# Patient Record
Sex: Female | Born: 1970 | Race: Black or African American | Hispanic: No | Marital: Single
Health system: Southern US, Academic
[De-identification: ages and names within clinical notes are randomized; demographics above are authoritative.]

## PROBLEM LIST (undated history)

## (undated) ENCOUNTER — Encounter

## (undated) ENCOUNTER — Ambulatory Visit

## (undated) ENCOUNTER — Encounter: Attending: Nephrology | Primary: Nephrology

## (undated) ENCOUNTER — Telehealth

## (undated) ENCOUNTER — Encounter: Attending: Infectious Disease | Primary: Infectious Disease

## (undated) ENCOUNTER — Encounter: Payer: MEDICAID | Attending: Podiatrist | Primary: Podiatrist

## (undated) ENCOUNTER — Encounter
Attending: Rehabilitative and Restorative Service Providers" | Primary: Rehabilitative and Restorative Service Providers"

## (undated) ENCOUNTER — Telehealth: Attending: Family Medicine | Primary: Family Medicine

## (undated) ENCOUNTER — Encounter
Payer: MEDICAID | Attending: Rehabilitative and Restorative Service Providers" | Primary: Rehabilitative and Restorative Service Providers"

## (undated) ENCOUNTER — Ambulatory Visit: Payer: MEDICAID | Attending: Family Medicine | Primary: Family Medicine

## (undated) ENCOUNTER — Telehealth: Attending: Infectious Disease | Primary: Infectious Disease

## (undated) ENCOUNTER — Ambulatory Visit: Payer: MEDICAID | Attending: Registered" | Primary: Registered"

## (undated) ENCOUNTER — Encounter: Attending: Podiatrist | Primary: Podiatrist

## (undated) ENCOUNTER — Encounter: Attending: Internal Medicine | Primary: Internal Medicine

## (undated) ENCOUNTER — Ambulatory Visit: Payer: Medicare (Managed Care) | Attending: Adult Health | Primary: Adult Health

## (undated) ENCOUNTER — Encounter: Payer: MEDICAID | Attending: Nephrology | Primary: Nephrology

## (undated) ENCOUNTER — Ambulatory Visit: Payer: Medicare (Managed Care) | Attending: Podiatrist | Primary: Podiatrist

## (undated) ENCOUNTER — Ambulatory Visit: Payer: MEDICAID

## (undated) ENCOUNTER — Ambulatory Visit: Payer: MEDICARE

## (undated) ENCOUNTER — Encounter: Payer: MEDICAID | Attending: Registered" | Primary: Registered"

## (undated) ENCOUNTER — Encounter: Payer: MEDICAID | Attending: Infectious Disease | Primary: Infectious Disease

## (undated) ENCOUNTER — Encounter: Payer: MEDICAID | Attending: Cardiovascular Disease | Primary: Cardiovascular Disease

## (undated) ENCOUNTER — Ambulatory Visit: Payer: Medicare (Managed Care)

## (undated) ENCOUNTER — Ambulatory Visit: Payer: MEDICARE | Attending: Vascular Surgery | Primary: Vascular Surgery

## (undated) ENCOUNTER — Encounter: Attending: Family Medicine | Primary: Family Medicine

## (undated) ENCOUNTER — Encounter: Attending: Acute Care | Primary: Acute Care

## (undated) ENCOUNTER — Encounter: Attending: Obstetrics & Gynecology | Primary: Obstetrics & Gynecology

## (undated) ENCOUNTER — Ambulatory Visit
Payer: Medicare (Managed Care) | Attending: Student in an Organized Health Care Education/Training Program | Primary: Student in an Organized Health Care Education/Training Program

## (undated) ENCOUNTER — Ambulatory Visit: Payer: MEDICARE | Attending: Podiatrist | Primary: Podiatrist

## (undated) ENCOUNTER — Ambulatory Visit: Payer: MEDICAID | Attending: Podiatrist | Primary: Podiatrist

## (undated) ENCOUNTER — Encounter
Attending: Student in an Organized Health Care Education/Training Program | Primary: Student in an Organized Health Care Education/Training Program

## (undated) ENCOUNTER — Telehealth: Attending: Nephrology | Primary: Nephrology

## (undated) ENCOUNTER — Non-Acute Institutional Stay: Payer: MEDICAID | Attending: Infectious Disease | Primary: Infectious Disease

## (undated) ENCOUNTER — Ambulatory Visit
Attending: Student in an Organized Health Care Education/Training Program | Primary: Student in an Organized Health Care Education/Training Program

## (undated) ENCOUNTER — Telehealth: Attending: Obstetrics & Gynecology | Primary: Obstetrics & Gynecology

## (undated) ENCOUNTER — Telehealth
Attending: Student in an Organized Health Care Education/Training Program | Primary: Student in an Organized Health Care Education/Training Program

## (undated) ENCOUNTER — Encounter: Payer: MEDICAID | Attending: Family Medicine | Primary: Family Medicine

## (undated) ENCOUNTER — Encounter: Payer: MEDICAID | Attending: Vascular Surgery | Primary: Vascular Surgery

## (undated) ENCOUNTER — Inpatient Hospital Stay: Payer: Medicare (Managed Care)

## (undated) ENCOUNTER — Ambulatory Visit: Payer: MEDICARE | Attending: Obstetrics & Gynecology | Primary: Obstetrics & Gynecology

## (undated) ENCOUNTER — Ambulatory Visit: Payer: MEDICARE | Attending: Family Medicine | Primary: Family Medicine

## (undated) ENCOUNTER — Encounter: Attending: Vascular Surgery | Primary: Vascular Surgery

## (undated) ENCOUNTER — Ambulatory Visit: Payer: Medicare (Managed Care) | Attending: Family Medicine | Primary: Family Medicine

## (undated) ENCOUNTER — Ambulatory Visit: Payer: Medicare (Managed Care) | Attending: Vascular Surgery | Primary: Vascular Surgery

## (undated) ENCOUNTER — Ambulatory Visit
Payer: MEDICAID | Attending: Student in an Organized Health Care Education/Training Program | Primary: Student in an Organized Health Care Education/Training Program

## (undated) ENCOUNTER — Ambulatory Visit: Attending: Podiatrist | Primary: Podiatrist

## (undated) ENCOUNTER — Telehealth: Attending: Acute Care | Primary: Acute Care

## (undated) ENCOUNTER — Encounter: Attending: Oncology | Primary: Oncology

## (undated) ENCOUNTER — Ambulatory Visit: Payer: Medicare (Managed Care) | Attending: Obstetrics & Gynecology | Primary: Obstetrics & Gynecology

## (undated) ENCOUNTER — Encounter: Attending: Cardiovascular Disease | Primary: Cardiovascular Disease

## (undated) ENCOUNTER — Other Ambulatory Visit

## (undated) ENCOUNTER — Ambulatory Visit: Payer: Medicare (Managed Care) | Attending: Orthopaedic Surgery | Primary: Orthopaedic Surgery

## (undated) ENCOUNTER — Ambulatory Visit: Payer: MEDICAID | Attending: Nephrology | Primary: Nephrology

## (undated) ENCOUNTER — Ambulatory Visit: Payer: Medicare (Managed Care) | Attending: Neurology | Primary: Neurology

## (undated) ENCOUNTER — Ambulatory Visit: Attending: Infectious Disease | Primary: Infectious Disease

## (undated) ENCOUNTER — Ambulatory Visit: Payer: Medicare (Managed Care) | Attending: Internal Medicine | Primary: Internal Medicine

## (undated) ENCOUNTER — Ambulatory Visit: Attending: Family Medicine | Primary: Family Medicine

## (undated) ENCOUNTER — Encounter: Payer: MEDICAID | Attending: Family | Primary: Family

## (undated) DIAGNOSIS — N183 Chronic kidney disease, stage 3 unspecified: Secondary | ICD-10-CM

## (undated) DIAGNOSIS — I219 Acute myocardial infarction, unspecified: Secondary | ICD-10-CM

## (undated) DIAGNOSIS — K219 Gastro-esophageal reflux disease without esophagitis: Secondary | ICD-10-CM

## (undated) DIAGNOSIS — E119 Type 2 diabetes mellitus without complications: Secondary | ICD-10-CM

## (undated) DIAGNOSIS — Z940 Kidney transplant status: Secondary | ICD-10-CM

## (undated) DIAGNOSIS — I10 Essential (primary) hypertension: Secondary | ICD-10-CM

## (undated) HISTORY — PX: TOE AMPUTATION: SHX809

## (undated) HISTORY — PX: NEPHRECTOMY TRANSPLANTED ORGAN: SUR880

## (undated) HISTORY — PX: COMBINED KIDNEY-PANCREAS TRANSPLANT: SHX1382

## (undated) HISTORY — PX: EYE SURGERY: SHX253

## (undated) HISTORY — PX: CARPAL TUNNEL RELEASE: SHX101

## (undated) MED ORDER — DALVANCE IV: INTRAVENOUS | 0 days

---
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
## 2004-02-02 ENCOUNTER — Ambulatory Visit: Payer: Self-pay

## 2004-04-29 ENCOUNTER — Emergency Department: Payer: Self-pay | Admitting: Emergency Medicine

## 2005-05-25 ENCOUNTER — Emergency Department: Payer: Self-pay | Admitting: Emergency Medicine

## 2005-05-25 ENCOUNTER — Other Ambulatory Visit: Payer: Self-pay

## 2005-10-08 ENCOUNTER — Encounter: Payer: Self-pay | Admitting: Family Medicine

## 2005-10-27 ENCOUNTER — Encounter: Payer: Self-pay | Admitting: Family Medicine

## 2006-04-12 ENCOUNTER — Ambulatory Visit: Payer: Self-pay | Admitting: Ophthalmology

## 2006-04-17 ENCOUNTER — Ambulatory Visit: Payer: Self-pay | Admitting: Ophthalmology

## 2006-07-03 ENCOUNTER — Emergency Department: Payer: Self-pay

## 2006-12-15 ENCOUNTER — Emergency Department: Payer: Self-pay | Admitting: Emergency Medicine

## 2007-03-25 ENCOUNTER — Other Ambulatory Visit: Payer: Self-pay

## 2007-03-25 ENCOUNTER — Ambulatory Visit: Payer: Self-pay | Admitting: Vascular Surgery

## 2007-03-26 ENCOUNTER — Ambulatory Visit: Payer: Self-pay | Admitting: Vascular Surgery

## 2007-05-13 ENCOUNTER — Ambulatory Visit: Payer: Self-pay | Admitting: Vascular Surgery

## 2007-07-17 ENCOUNTER — Ambulatory Visit: Payer: Self-pay | Admitting: Nephrology

## 2007-08-22 ENCOUNTER — Ambulatory Visit: Payer: Self-pay | Admitting: Vascular Surgery

## 2007-08-30 ENCOUNTER — Ambulatory Visit: Payer: Self-pay | Admitting: Nephrology

## 2007-09-23 ENCOUNTER — Ambulatory Visit: Payer: Self-pay | Admitting: Cardiology

## 2007-09-23 ENCOUNTER — Other Ambulatory Visit: Payer: Self-pay

## 2007-09-23 ENCOUNTER — Ambulatory Visit: Payer: Self-pay | Admitting: Vascular Surgery

## 2007-09-29 ENCOUNTER — Ambulatory Visit: Payer: Self-pay | Admitting: Vascular Surgery

## 2007-10-18 ENCOUNTER — Ambulatory Visit: Payer: Self-pay | Admitting: Internal Medicine

## 2007-10-31 ENCOUNTER — Ambulatory Visit: Payer: Self-pay | Admitting: Vascular Surgery

## 2007-11-14 ENCOUNTER — Ambulatory Visit: Payer: Self-pay | Admitting: Vascular Surgery

## 2007-11-27 ENCOUNTER — Ambulatory Visit: Payer: Self-pay | Admitting: Internal Medicine

## 2007-11-28 ENCOUNTER — Ambulatory Visit: Payer: Self-pay | Admitting: Vascular Surgery

## 2007-12-09 ENCOUNTER — Inpatient Hospital Stay: Payer: Self-pay | Admitting: Internal Medicine

## 2007-12-28 ENCOUNTER — Ambulatory Visit: Payer: Self-pay | Admitting: Oncology

## 2008-01-06 ENCOUNTER — Ambulatory Visit: Payer: Self-pay | Admitting: Nephrology

## 2008-01-12 ENCOUNTER — Ambulatory Visit: Payer: Self-pay | Admitting: Vascular Surgery

## 2008-01-13 ENCOUNTER — Emergency Department: Payer: Self-pay | Admitting: Emergency Medicine

## 2008-01-17 ENCOUNTER — Inpatient Hospital Stay: Payer: Self-pay | Admitting: Internal Medicine

## 2008-01-27 ENCOUNTER — Ambulatory Visit: Payer: Self-pay | Admitting: Oncology

## 2008-02-04 ENCOUNTER — Inpatient Hospital Stay: Payer: Self-pay | Admitting: Internal Medicine

## 2008-02-07 ENCOUNTER — Ambulatory Visit: Payer: Self-pay | Admitting: Nephrology

## 2008-02-12 ENCOUNTER — Ambulatory Visit: Payer: Self-pay | Admitting: Nephrology

## 2008-02-17 ENCOUNTER — Ambulatory Visit: Payer: Self-pay | Admitting: Internal Medicine

## 2008-03-04 ENCOUNTER — Ambulatory Visit: Payer: Self-pay | Admitting: Nephrology

## 2008-03-06 ENCOUNTER — Ambulatory Visit: Payer: Self-pay | Admitting: Nephrology

## 2008-03-08 ENCOUNTER — Ambulatory Visit: Payer: Self-pay | Admitting: Vascular Surgery

## 2008-03-09 ENCOUNTER — Ambulatory Visit: Payer: Self-pay | Admitting: Nephrology

## 2008-03-16 ENCOUNTER — Ambulatory Visit: Payer: Self-pay | Admitting: Nephrology

## 2008-03-26 ENCOUNTER — Ambulatory Visit: Payer: Self-pay | Admitting: Vascular Surgery

## 2008-03-29 ENCOUNTER — Ambulatory Visit: Payer: Self-pay | Admitting: Vascular Surgery

## 2008-04-08 ENCOUNTER — Emergency Department: Payer: Self-pay | Admitting: Emergency Medicine

## 2008-05-06 ENCOUNTER — Encounter: Payer: Self-pay | Admitting: Family

## 2008-05-21 ENCOUNTER — Ambulatory Visit: Payer: Self-pay | Admitting: Vascular Surgery

## 2008-06-16 ENCOUNTER — Inpatient Hospital Stay: Payer: Self-pay | Admitting: Podiatry

## 2008-06-27 ENCOUNTER — Inpatient Hospital Stay: Payer: Self-pay | Admitting: Nephrology

## 2008-07-09 ENCOUNTER — Ambulatory Visit: Payer: Self-pay | Admitting: Vascular Surgery

## 2008-07-27 ENCOUNTER — Emergency Department: Payer: Self-pay | Admitting: Internal Medicine

## 2008-08-04 ENCOUNTER — Ambulatory Visit: Payer: Self-pay | Admitting: Podiatry

## 2008-08-06 ENCOUNTER — Ambulatory Visit: Payer: Self-pay | Admitting: Podiatry

## 2008-09-07 ENCOUNTER — Ambulatory Visit: Payer: Self-pay | Admitting: Vascular Surgery

## 2008-10-21 ENCOUNTER — Emergency Department: Payer: Self-pay | Admitting: Emergency Medicine

## 2008-12-16 ENCOUNTER — Ambulatory Visit: Payer: Self-pay | Admitting: Vascular Surgery

## 2009-01-31 DIAGNOSIS — K3184 Gastroparesis: Secondary | ICD-10-CM | POA: Insufficient documentation

## 2009-04-01 ENCOUNTER — Ambulatory Visit: Payer: Self-pay | Admitting: Vascular Surgery

## 2009-04-05 ENCOUNTER — Ambulatory Visit: Payer: Self-pay | Admitting: Vascular Surgery

## 2009-10-11 ENCOUNTER — Other Ambulatory Visit: Payer: Self-pay

## 2009-10-27 ENCOUNTER — Other Ambulatory Visit: Payer: Self-pay

## 2009-11-26 ENCOUNTER — Other Ambulatory Visit: Payer: Self-pay

## 2010-01-16 ENCOUNTER — Other Ambulatory Visit: Payer: Self-pay

## 2010-01-26 ENCOUNTER — Other Ambulatory Visit: Payer: Self-pay

## 2010-02-20 ENCOUNTER — Other Ambulatory Visit: Payer: Self-pay

## 2010-02-26 ENCOUNTER — Other Ambulatory Visit: Payer: Self-pay

## 2010-04-25 ENCOUNTER — Other Ambulatory Visit: Payer: Self-pay

## 2010-04-27 ENCOUNTER — Other Ambulatory Visit: Payer: Self-pay

## 2010-05-15 ENCOUNTER — Encounter: Payer: Self-pay | Admitting: Family Medicine

## 2010-05-28 ENCOUNTER — Encounter: Payer: Self-pay | Admitting: Family Medicine

## 2010-05-29 ENCOUNTER — Other Ambulatory Visit: Payer: Self-pay

## 2010-06-27 ENCOUNTER — Encounter: Payer: Self-pay | Admitting: Family Medicine

## 2010-06-27 ENCOUNTER — Other Ambulatory Visit: Payer: Self-pay

## 2010-08-22 ENCOUNTER — Other Ambulatory Visit: Payer: Self-pay

## 2010-08-27 ENCOUNTER — Other Ambulatory Visit: Payer: Self-pay

## 2010-11-21 ENCOUNTER — Other Ambulatory Visit: Payer: Self-pay

## 2010-11-27 ENCOUNTER — Other Ambulatory Visit: Payer: Self-pay

## 2010-12-28 ENCOUNTER — Other Ambulatory Visit: Payer: Self-pay

## 2011-01-27 ENCOUNTER — Other Ambulatory Visit: Payer: Self-pay

## 2011-02-27 ENCOUNTER — Other Ambulatory Visit: Payer: Self-pay

## 2011-03-08 LAB — CBC WITH DIFFERENTIAL/PLATELET
Basophil #: 0 10*3/uL (ref 0.0–0.1)
Basophil %: 0.2 %
Eosinophil %: 3.2 %
HGB: 10.7 g/dL — ABNORMAL LOW (ref 12.0–16.0)
Lymphocyte %: 20.4 %
MCH: 30.4 pg (ref 26.0–34.0)
Monocyte #: 0.7 10*3/uL (ref 0.0–0.7)
Monocyte %: 8.8 %
Neutrophil %: 67.4 %
Platelet: 138 10*3/uL — ABNORMAL LOW (ref 150–440)
RBC: 3.52 10*6/uL — ABNORMAL LOW (ref 3.80–5.20)
WBC: 7.4 10*3/uL (ref 3.6–11.0)

## 2011-03-08 LAB — BASIC METABOLIC PANEL
BUN: 24 mg/dL — ABNORMAL HIGH (ref 7–18)
Chloride: 109 mmol/L — ABNORMAL HIGH (ref 98–107)
EGFR (Non-African Amer.): 58 — ABNORMAL LOW
Glucose: 69 mg/dL (ref 65–99)
Potassium: 4.2 mmol/L (ref 3.5–5.1)
Sodium: 141 mmol/L (ref 136–145)

## 2011-03-08 LAB — AMYLASE: Amylase: 32 U/L (ref 25–115)

## 2011-03-08 LAB — LIPASE, BLOOD: Lipase: 76 U/L (ref 73–393)

## 2011-03-08 LAB — PHOSPHORUS: Phosphorus: 3.6 mg/dL (ref 2.5–4.9)

## 2011-03-30 ENCOUNTER — Other Ambulatory Visit: Payer: Self-pay

## 2011-05-17 ENCOUNTER — Other Ambulatory Visit: Payer: Self-pay

## 2011-05-17 LAB — CBC WITH DIFFERENTIAL/PLATELET
Basophil #: 0 10*3/uL (ref 0.0–0.1)
Eosinophil %: 3 %
HCT: 35.8 % (ref 35.0–47.0)
Lymphocyte #: 0.9 10*3/uL — ABNORMAL LOW (ref 1.0–3.6)
Lymphocyte %: 14.1 %
MCH: 30.4 pg (ref 26.0–34.0)
MCV: 94 fL (ref 80–100)
Monocyte #: 0.6 10*3/uL (ref 0.0–0.7)
Monocyte %: 9.2 %
Neutrophil #: 4.9 10*3/uL (ref 1.4–6.5)
RDW: 15.5 % — ABNORMAL HIGH (ref 11.5–14.5)
WBC: 6.7 10*3/uL (ref 3.6–11.0)

## 2011-05-17 LAB — BASIC METABOLIC PANEL
Anion Gap: 9 (ref 7–16)
BUN: 20 mg/dL — ABNORMAL HIGH (ref 7–18)
Calcium, Total: 8.5 mg/dL (ref 8.5–10.1)
Chloride: 113 mmol/L — ABNORMAL HIGH (ref 98–107)
Co2: 19 mmol/L — ABNORMAL LOW (ref 21–32)
Creatinine: 0.89 mg/dL (ref 0.60–1.30)
EGFR (Non-African Amer.): >60
Glucose: 72 mg/dL (ref 65–99)
Osmolality: 282 (ref 275–301)
Potassium: 5 mmol/L (ref 3.5–5.1)
Sodium: 141 mmol/L (ref 136–145)

## 2011-05-17 LAB — PHOSPHORUS: Phosphorus: 3.3 mg/dL (ref 2.5–4.9)

## 2011-05-28 ENCOUNTER — Other Ambulatory Visit: Payer: Self-pay

## 2011-09-04 ENCOUNTER — Other Ambulatory Visit: Payer: Self-pay

## 2011-09-04 LAB — CBC WITH DIFFERENTIAL/PLATELET
Basophil %: 0.6 %
Eosinophil #: 0.5 10*3/uL (ref 0.0–0.7)
HCT: 35.4 % (ref 35.0–47.0)
HGB: 11.3 g/dL — ABNORMAL LOW (ref 12.0–16.0)
Lymphocyte #: 1.1 10*3/uL (ref 1.0–3.6)
Lymphocyte %: 18.8 %
MCH: 30.1 pg (ref 26.0–34.0)
MCHC: 31.8 g/dL — ABNORMAL LOW (ref 32.0–36.0)
Monocyte #: 0.5 x10 3/mm (ref 0.2–0.9)
Neutrophil %: 63.1 %
Platelet: 158 10*3/uL (ref 150–440)

## 2011-09-04 LAB — BASIC METABOLIC PANEL
Anion Gap: 7 (ref 7–16)
BUN: 27 mg/dL — ABNORMAL HIGH (ref 7–18)
Calcium, Total: 8.5 mg/dL (ref 8.5–10.1)
EGFR (African American): >60
EGFR (Non-African Amer.): 52 — ABNORMAL LOW
Glucose: 77 mg/dL (ref 65–99)
Osmolality: 283 (ref 275–301)

## 2011-09-04 LAB — HEPATIC FUNCTION PANEL A (ARMC)
Bilirubin, Direct: 0.2 mg/dL (ref 0.00–0.20)
SGOT(AST): 25 U/L (ref 15–37)

## 2011-09-04 LAB — MAGNESIUM: Magnesium: 1.7 mg/dL — ABNORMAL LOW

## 2011-09-04 LAB — PHOSPHORUS: Phosphorus: 3.2 mg/dL (ref 2.5–4.9)

## 2011-09-27 ENCOUNTER — Other Ambulatory Visit: Payer: Self-pay

## 2011-10-16 LAB — CBC WITH DIFFERENTIAL/PLATELET
Basophil %: 0.6 %
Eosinophil #: 0.3 10*3/uL (ref 0.0–0.7)
Eosinophil %: 4.2 %
HCT: 35.4 % (ref 35.0–47.0)
HGB: 11.6 g/dL — ABNORMAL LOW (ref 12.0–16.0)
Lymphocyte %: 14.2 %
MCH: 30.9 pg (ref 26.0–34.0)
MCHC: 32.8 g/dL (ref 32.0–36.0)
Neutrophil #: 4.7 10*3/uL (ref 1.4–6.5)
Neutrophil %: 72.4 %

## 2011-10-16 LAB — BASIC METABOLIC PANEL
Calcium, Total: 8.7 mg/dL (ref 8.5–10.1)
Chloride: 111 mmol/L — ABNORMAL HIGH (ref 98–107)
Co2: 22 mmol/L (ref 21–32)
Creatinine: 1.08 mg/dL (ref 0.60–1.30)
EGFR (African American): >60
EGFR (Non-African Amer.): >60
Glucose: 71 mg/dL (ref 65–99)
Potassium: 4.4 mmol/L (ref 3.5–5.1)
Sodium: 139 mmol/L (ref 136–145)

## 2011-10-16 LAB — PHOSPHORUS: Phosphorus: 2.5 mg/dL (ref 2.5–4.9)

## 2011-10-28 ENCOUNTER — Other Ambulatory Visit: Payer: Self-pay

## 2011-11-20 ENCOUNTER — Encounter: Payer: Self-pay | Admitting: Family Medicine

## 2011-11-27 ENCOUNTER — Encounter: Payer: Self-pay | Admitting: Family Medicine

## 2011-12-28 ENCOUNTER — Encounter: Payer: Self-pay | Admitting: Family Medicine

## 2012-02-08 ENCOUNTER — Other Ambulatory Visit: Payer: Self-pay | Admitting: Nephrology

## 2012-02-08 LAB — LIPID PANEL
Cholesterol: 123 mg/dL (ref 0–200)
HDL Cholesterol: 71 mg/dL — ABNORMAL HIGH (ref 40–60)
Ldl Cholesterol, Calc: 43 mg/dL (ref 0–100)
Triglycerides: 45 mg/dL (ref 0–200)
VLDL Cholesterol, Calc: 9 mg/dL (ref 5–40)

## 2012-02-08 LAB — COMPREHENSIVE METABOLIC PANEL
Alkaline Phosphatase: 97 U/L (ref 50–136)
BUN: 21 mg/dL — ABNORMAL HIGH (ref 7–18)
Bilirubin,Total: 0.4 mg/dL (ref 0.2–1.0)
Chloride: 110 mmol/L — ABNORMAL HIGH (ref 98–107)
Co2: 22 mmol/L (ref 21–32)
Creatinine: 1.11 mg/dL (ref 0.60–1.30)
Osmolality: 280 (ref 275–301)
SGOT(AST): 24 U/L (ref 15–37)
SGPT (ALT): 28 U/L (ref 12–78)
Sodium: 139 mmol/L (ref 136–145)
Total Protein: 7.6 g/dL (ref 6.4–8.2)

## 2012-02-08 LAB — CBC WITH DIFFERENTIAL/PLATELET
Basophil #: 0.1 10*3/uL (ref 0.0–0.1)
Eosinophil %: 4.2 %
Lymphocyte %: 18.7 %
MCV: 94 fL (ref 80–100)
Monocyte #: 0.5 x10 3/mm (ref 0.2–0.9)
Monocyte %: 9.4 %
Neutrophil %: 66.8 %
Platelet: 220 10*3/uL (ref 150–440)
RDW: 14.6 % — ABNORMAL HIGH (ref 11.5–14.5)
WBC: 5.8 10*3/uL (ref 3.6–11.0)

## 2012-02-08 LAB — MAGNESIUM: Magnesium: 2 mg/dL

## 2012-02-08 LAB — PHOSPHORUS: Phosphorus: 3.1 mg/dL (ref 2.5–4.9)

## 2012-02-27 ENCOUNTER — Other Ambulatory Visit: Payer: Self-pay | Admitting: Nephrology

## 2012-03-25 LAB — CBC WITH DIFFERENTIAL/PLATELET
Basophil %: 0.6 %
Eosinophil #: 0.4 10*3/uL (ref 0.0–0.7)
Eosinophil %: 5.6 %
HGB: 12 g/dL (ref 12.0–16.0)
Lymphocyte #: 1.1 10*3/uL (ref 1.0–3.6)
Lymphocyte %: 15.3 %
MCH: 30.4 pg (ref 26.0–34.0)
MCHC: 32.2 g/dL (ref 32.0–36.0)
MCV: 94 fL (ref 80–100)
Monocyte #: 0.6 x10 3/mm (ref 0.2–0.9)
Neutrophil #: 5.3 10*3/uL (ref 1.4–6.5)
Platelet: 164 10*3/uL (ref 150–440)
RBC: 3.95 10*6/uL (ref 3.80–5.20)
RDW: 15 % — ABNORMAL HIGH (ref 11.5–14.5)
WBC: 7.5 10*3/uL (ref 3.6–11.0)

## 2012-03-25 LAB — BASIC METABOLIC PANEL
BUN: 22 mg/dL — ABNORMAL HIGH (ref 7–18)
Calcium, Total: 8.8 mg/dL (ref 8.5–10.1)
Chloride: 113 mmol/L — ABNORMAL HIGH (ref 98–107)
Co2: 19 mmol/L — ABNORMAL LOW (ref 21–32)
Creatinine: 1.19 mg/dL (ref 0.60–1.30)
EGFR (African American): >60
Glucose: 80 mg/dL (ref 65–99)
Potassium: 4.8 mmol/L (ref 3.5–5.1)
Sodium: 140 mmol/L (ref 136–145)

## 2012-03-25 LAB — PHOSPHORUS: Phosphorus: 2.9 mg/dL (ref 2.5–4.9)

## 2012-03-25 LAB — MAGNESIUM: Magnesium: 1.9 mg/dL

## 2012-03-29 ENCOUNTER — Other Ambulatory Visit: Payer: Self-pay | Admitting: Nephrology

## 2012-06-05 ENCOUNTER — Other Ambulatory Visit: Payer: Self-pay | Admitting: Nephrology

## 2012-06-05 LAB — CBC WITH DIFFERENTIAL/PLATELET
Basophil #: 0 10*3/uL (ref 0.0–0.1)
Basophil %: 0.6 %
Eosinophil %: 5.2 %
HCT: 34.9 % — ABNORMAL LOW (ref 35.0–47.0)
Lymphocyte #: 1.1 10*3/uL (ref 1.0–3.6)
MCHC: 32.4 g/dL (ref 32.0–36.0)
MCV: 94 fL (ref 80–100)
Monocyte #: 0.5 x10 3/mm (ref 0.2–0.9)
Monocyte %: 7.2 %
Neutrophil #: 4.6 10*3/uL (ref 1.4–6.5)
Neutrophil %: 70.6 %
RBC: 3.71 10*6/uL — ABNORMAL LOW (ref 3.80–5.20)
RDW: 15.6 % — ABNORMAL HIGH (ref 11.5–14.5)
WBC: 6.5 10*3/uL (ref 3.6–11.0)

## 2012-06-05 LAB — COMPREHENSIVE METABOLIC PANEL
Albumin: 3.6 g/dL (ref 3.4–5.0)
Alkaline Phosphatase: 79 U/L (ref 50–136)
Anion Gap: 4 — ABNORMAL LOW (ref 7–16)
Chloride: 114 mmol/L — ABNORMAL HIGH (ref 98–107)
EGFR (Non-African Amer.): 60 — ABNORMAL LOW
Glucose: 84 mg/dL (ref 65–99)
Osmolality: 282 (ref 275–301)
SGOT(AST): 24 U/L (ref 15–37)
SGPT (ALT): 25 U/L (ref 12–78)
Total Protein: 7.2 g/dL (ref 6.4–8.2)

## 2012-06-05 LAB — HEMOGLOBIN A1C: Hemoglobin A1C: 4.2 % (ref 4.2–6.3)

## 2012-06-05 LAB — AMYLASE: Amylase: 37 U/L (ref 25–115)

## 2012-06-05 LAB — BILIRUBIN, DIRECT: Bilirubin, Direct: 0.1 mg/dL (ref 0.00–0.20)

## 2012-06-26 ENCOUNTER — Other Ambulatory Visit: Payer: Self-pay | Admitting: Nephrology

## 2012-08-26 ENCOUNTER — Other Ambulatory Visit: Payer: Self-pay | Admitting: Nephrology

## 2012-08-26 LAB — BASIC METABOLIC PANEL
Anion Gap: 8 (ref 7–16)
BUN: 28 mg/dL — ABNORMAL HIGH (ref 7–18)
Calcium, Total: 8.9 mg/dL (ref 8.5–10.1)
Chloride: 109 mmol/L — ABNORMAL HIGH (ref 98–107)
Co2: 23 mmol/L (ref 21–32)
Creatinine: 1.51 mg/dL — ABNORMAL HIGH (ref 0.60–1.30)
EGFR (African American): 49 — ABNORMAL LOW
EGFR (Non-African Amer.): 42 — ABNORMAL LOW
Glucose: 73 mg/dL (ref 65–99)
Osmolality: 283 (ref 275–301)
Potassium: 4.5 mmol/L (ref 3.5–5.1)
Sodium: 140 mmol/L (ref 136–145)

## 2012-08-26 LAB — CBC WITH DIFFERENTIAL/PLATELET
Basophil #: 0 10*3/uL (ref 0.0–0.1)
Basophil %: 0.6 %
Eosinophil #: 0.3 10*3/uL (ref 0.0–0.7)
Eosinophil %: 4.5 %
HCT: 34.9 % — ABNORMAL LOW (ref 35.0–47.0)
HGB: 11.4 g/dL — ABNORMAL LOW (ref 12.0–16.0)
Lymphocyte #: 1.4 10*3/uL (ref 1.0–3.6)
Lymphocyte %: 18.3 %
MCH: 30 pg (ref 26.0–34.0)
MCHC: 32.6 g/dL (ref 32.0–36.0)
MCV: 92 fL (ref 80–100)
Monocyte #: 0.7 x10 3/mm (ref 0.2–0.9)
Monocyte %: 9.2 %
Neutrophil #: 5.1 10*3/uL (ref 1.4–6.5)
Neutrophil %: 67.4 %
Platelet: 196 10*3/uL (ref 150–440)
RBC: 3.79 10*6/uL — ABNORMAL LOW (ref 3.80–5.20)
RDW: 14.1 % (ref 11.5–14.5)
WBC: 7.5 10*3/uL (ref 3.6–11.0)

## 2012-08-26 LAB — HEPATIC FUNCTION PANEL A (ARMC)
Albumin: 3.6 g/dL (ref 3.4–5.0)
Alkaline Phosphatase: 90 U/L (ref 50–136)
Bilirubin, Direct: 0.1 mg/dL (ref 0.00–0.20)
Bilirubin,Total: 0.4 mg/dL (ref 0.2–1.0)
SGOT(AST): 23 U/L (ref 15–37)
SGPT (ALT): 26 U/L (ref 12–78)
Total Protein: 7.5 g/dL (ref 6.4–8.2)

## 2012-08-26 LAB — MAGNESIUM: Magnesium: 2.2 mg/dL

## 2012-08-26 LAB — HEMOGLOBIN A1C: Hemoglobin A1C: 4.9 % (ref 4.2–6.3)

## 2012-08-27 LAB — GAMMA GT: GGT: 13 U/L (ref 5–85)

## 2012-09-23 LAB — CBC WITH DIFFERENTIAL/PLATELET
Basophil #: 0 10*3/uL (ref 0.0–0.1)
Eosinophil #: 0.3 10*3/uL (ref 0.0–0.7)
HCT: 30.7 % — ABNORMAL LOW (ref 35.0–47.0)
HGB: 10.3 g/dL — ABNORMAL LOW (ref 12.0–16.0)
Lymphocyte %: 20.1 %
MCH: 31.2 pg (ref 26.0–34.0)
MCHC: 33.7 g/dL (ref 32.0–36.0)
MCV: 93 fL (ref 80–100)
Monocyte #: 0.6 x10 3/mm (ref 0.2–0.9)
Monocyte %: 8.4 %
Neutrophil #: 5.1 10*3/uL (ref 1.4–6.5)
Platelet: 236 10*3/uL (ref 150–440)
RBC: 3.32 10*6/uL — ABNORMAL LOW (ref 3.80–5.20)
RDW: 15 % — ABNORMAL HIGH (ref 11.5–14.5)

## 2012-09-23 LAB — BASIC METABOLIC PANEL
Anion Gap: 9 (ref 7–16)
Chloride: 112 mmol/L — ABNORMAL HIGH (ref 98–107)
Creatinine: 1.24 mg/dL (ref 0.60–1.30)
EGFR (African American): >60
EGFR (Non-African Amer.): 54 — ABNORMAL LOW
Glucose: 89 mg/dL (ref 65–99)
Osmolality: 286 (ref 275–301)
Sodium: 142 mmol/L (ref 136–145)

## 2012-09-23 LAB — GAMMA GT: GGT: 8 U/L (ref 5–85)

## 2012-09-23 LAB — HEPATIC FUNCTION PANEL A (ARMC)
Albumin: 3.3 g/dL — ABNORMAL LOW (ref 3.4–5.0)
Alkaline Phosphatase: 83 U/L (ref 50–136)
SGOT(AST): 15 U/L (ref 15–37)
SGPT (ALT): 22 U/L (ref 12–78)

## 2012-09-23 LAB — AMYLASE: Amylase: 43 U/L (ref 25–115)

## 2012-09-23 LAB — LIPASE, BLOOD: Lipase: 140 U/L (ref 73–393)

## 2012-09-23 LAB — HEMOGLOBIN A1C: Hemoglobin A1C: 4.8 % (ref 4.2–6.3)

## 2012-09-26 ENCOUNTER — Other Ambulatory Visit: Payer: Self-pay | Admitting: Nephrology

## 2012-11-06 ENCOUNTER — Other Ambulatory Visit: Payer: Self-pay | Admitting: Nephrology

## 2012-11-06 LAB — CBC WITH DIFFERENTIAL/PLATELET
Basophil #: 0 10*3/uL (ref 0.0–0.1)
Basophil %: 0.3 %
Eosinophil #: 0.2 10*3/uL (ref 0.0–0.7)
Eosinophil %: 1.9 %
HCT: 30.3 % — ABNORMAL LOW (ref 35.0–47.0)
HGB: 9.9 g/dL — ABNORMAL LOW (ref 12.0–16.0)
Lymphocyte #: 1.4 10*3/uL (ref 1.0–3.6)
Monocyte #: 0.6 x10 3/mm (ref 0.2–0.9)
RBC: 3.24 10*6/uL — ABNORMAL LOW (ref 3.80–5.20)
WBC: 8.4 10*3/uL (ref 3.6–11.0)

## 2012-11-06 LAB — PHOSPHORUS: Phosphorus: 2.4 mg/dL — ABNORMAL LOW (ref 2.5–4.9)

## 2012-11-06 LAB — BASIC METABOLIC PANEL
Anion Gap: 7 (ref 7–16)
BUN: 23 mg/dL — ABNORMAL HIGH (ref 7–18)
Calcium, Total: 8.4 mg/dL — ABNORMAL LOW (ref 8.5–10.1)
Chloride: 110 mmol/L — ABNORMAL HIGH (ref 98–107)
Co2: 19 mmol/L — ABNORMAL LOW (ref 21–32)
Creatinine: 1.1 mg/dL (ref 0.60–1.30)
EGFR (African American): >60
Glucose: 76 mg/dL (ref 65–99)
Osmolality: 274 (ref 275–301)
Sodium: 136 mmol/L (ref 136–145)

## 2012-11-06 LAB — AMYLASE: Amylase: 36 U/L (ref 25–115)

## 2012-11-06 LAB — LIPASE, BLOOD: Lipase: 88 U/L (ref 73–393)

## 2012-11-06 LAB — MAGNESIUM: Magnesium: 2.1 mg/dL

## 2012-11-26 ENCOUNTER — Other Ambulatory Visit: Payer: Self-pay | Admitting: Nephrology

## 2013-01-05 ENCOUNTER — Other Ambulatory Visit: Payer: Self-pay | Admitting: Nephrology

## 2013-01-05 LAB — CBC WITH DIFFERENTIAL/PLATELET
Basophil #: 0.1 10*3/uL (ref 0.0–0.1)
Basophil %: 0.7 %
Eosinophil %: 2.7 %
HGB: 10 g/dL — ABNORMAL LOW (ref 12.0–16.0)
Lymphocyte #: 1.2 10*3/uL (ref 1.0–3.6)
MCV: 94 fL (ref 80–100)
Monocyte %: 10.4 %
Neutrophil #: 5.3 10*3/uL (ref 1.4–6.5)
Neutrophil %: 69.7 %
RBC: 3.24 10*6/uL — ABNORMAL LOW (ref 3.80–5.20)
RDW: 14.7 % — ABNORMAL HIGH (ref 11.5–14.5)
WBC: 7.5 10*3/uL (ref 3.6–11.0)

## 2013-01-05 LAB — LIPASE, BLOOD: Lipase: 103 U/L (ref 73–393)

## 2013-01-05 LAB — BASIC METABOLIC PANEL
BUN: 27 mg/dL — ABNORMAL HIGH (ref 7–18)
Co2: 21 mmol/L (ref 21–32)
EGFR (African American): 58 — ABNORMAL LOW
EGFR (Non-African Amer.): 50 — ABNORMAL LOW
Glucose: 89 mg/dL (ref 65–99)
Osmolality: 282 (ref 275–301)
Sodium: 139 mmol/L (ref 136–145)

## 2013-01-05 LAB — PHOSPHORUS: Phosphorus: 3.1 mg/dL (ref 2.5–4.9)

## 2013-01-05 LAB — HEPATIC FUNCTION PANEL A (ARMC)
Albumin: 3.5 g/dL (ref 3.4–5.0)
Alkaline Phosphatase: 74 U/L (ref 50–136)
Bilirubin, Direct: 0.1 mg/dL (ref 0.00–0.20)
Bilirubin,Total: 0.3 mg/dL (ref 0.2–1.0)
SGOT(AST): 21 U/L (ref 15–37)
SGPT (ALT): 23 U/L (ref 12–78)

## 2013-01-05 LAB — AMYLASE: Amylase: 36 U/L (ref 25–115)

## 2013-01-05 LAB — MAGNESIUM: Magnesium: 1.8 mg/dL

## 2013-01-26 ENCOUNTER — Other Ambulatory Visit: Payer: Self-pay | Admitting: Nephrology

## 2013-01-26 ENCOUNTER — Other Ambulatory Visit: Payer: Self-pay | Admitting: Podiatry

## 2013-02-21 ENCOUNTER — Emergency Department: Payer: Self-pay | Admitting: Emergency Medicine

## 2013-02-21 LAB — CBC WITH DIFFERENTIAL/PLATELET
Eosinophil #: 0.2 10*3/uL (ref 0.0–0.7)
Eosinophil %: 1.4 %
HCT: 32.3 % — ABNORMAL LOW (ref 35.0–47.0)
HGB: 10.4 g/dL — ABNORMAL LOW (ref 12.0–16.0)
Lymphocyte #: 1.3 10*3/uL (ref 1.0–3.6)
MCHC: 32.3 g/dL (ref 32.0–36.0)
MCV: 92 fL (ref 80–100)
Monocyte #: 1.6 x10 3/mm — ABNORMAL HIGH (ref 0.2–0.9)
Monocyte %: 13.2 %
Neutrophil #: 8.8 10*3/uL — ABNORMAL HIGH (ref 1.4–6.5)
Neutrophil %: 74.5 %
RBC: 3.53 10*6/uL — ABNORMAL LOW (ref 3.80–5.20)
RDW: 14.2 % (ref 11.5–14.5)
WBC: 11.9 10*3/uL — ABNORMAL HIGH (ref 3.6–11.0)

## 2013-02-21 LAB — COMPREHENSIVE METABOLIC PANEL
Anion Gap: 10 (ref 7–16)
BUN: 51 mg/dL — ABNORMAL HIGH (ref 7–18)
Calcium, Total: 9.3 mg/dL (ref 8.5–10.1)
Chloride: 108 mmol/L — ABNORMAL HIGH (ref 98–107)
Co2: 19 mmol/L — ABNORMAL LOW (ref 21–32)
Creatinine: 2.79 mg/dL — ABNORMAL HIGH (ref 0.60–1.30)
Glucose: 91 mg/dL (ref 65–99)
Potassium: 3.6 mmol/L (ref 3.5–5.1)
SGPT (ALT): 15 U/L (ref 12–78)

## 2013-02-26 LAB — CULTURE, BLOOD (SINGLE)

## 2013-05-05 ENCOUNTER — Other Ambulatory Visit: Payer: Self-pay | Admitting: Nephrology

## 2013-05-05 LAB — CBC WITH DIFFERENTIAL/PLATELET
Basophil #: 0.1 10*3/uL (ref 0.0–0.1)
Basophil %: 0.8 %
EOS ABS: 0.3 10*3/uL (ref 0.0–0.7)
Eosinophil %: 5.1 %
HCT: 31.9 % — ABNORMAL LOW (ref 35.0–47.0)
HGB: 10.4 g/dL — ABNORMAL LOW (ref 12.0–16.0)
Lymphocyte #: 1.4 10*3/uL (ref 1.0–3.6)
Lymphocyte %: 20.8 %
MCH: 30.7 pg (ref 26.0–34.0)
MCHC: 32.6 g/dL (ref 32.0–36.0)
MCV: 94 fL (ref 80–100)
Monocyte #: 0.4 x10 3/mm (ref 0.2–0.9)
Monocyte %: 6.4 %
Neutrophil #: 4.5 10*3/uL (ref 1.4–6.5)
Neutrophil %: 66.9 %
PLATELETS: 201 10*3/uL (ref 150–440)
RBC: 3.39 10*6/uL — AB (ref 3.80–5.20)
RDW: 16.9 % — ABNORMAL HIGH (ref 11.5–14.5)
WBC: 6.8 10*3/uL (ref 3.6–11.0)

## 2013-05-05 LAB — LIPASE, BLOOD: Lipase: 120 U/L (ref 73–393)

## 2013-05-05 LAB — PHOSPHORUS: Phosphorus: 3.3 mg/dL (ref 2.5–4.9)

## 2013-05-05 LAB — HEPATIC FUNCTION PANEL A (ARMC)
ALBUMIN: 3.3 g/dL — AB (ref 3.4–5.0)
AST: 19 U/L (ref 15–37)
Alkaline Phosphatase: 88 U/L
BILIRUBIN DIRECT: 0.1 mg/dL (ref 0.00–0.20)
Bilirubin,Total: 0.4 mg/dL (ref 0.2–1.0)
SGPT (ALT): 18 U/L (ref 12–78)
TOTAL PROTEIN: 7.4 g/dL (ref 6.4–8.2)

## 2013-05-05 LAB — HEMOGLOBIN A1C: HEMOGLOBIN A1C: 4.7 % (ref 4.2–6.3)

## 2013-05-05 LAB — BASIC METABOLIC PANEL
ANION GAP: 5 — AB (ref 7–16)
BUN: 26 mg/dL — AB (ref 7–18)
CO2: 21 mmol/L (ref 21–32)
Calcium, Total: 9.2 mg/dL (ref 8.5–10.1)
Chloride: 110 mmol/L — ABNORMAL HIGH (ref 98–107)
Creatinine: 1.12 mg/dL (ref 0.60–1.30)
EGFR (Non-African Amer.): >60
GLUCOSE: 63 mg/dL — AB (ref 65–99)
OSMOLALITY: 275 (ref 275–301)
Potassium: 4.8 mmol/L (ref 3.5–5.1)
SODIUM: 136 mmol/L (ref 136–145)

## 2013-05-05 LAB — GAMMA GT: GGT: 13 U/L (ref 5–85)

## 2013-05-05 LAB — MAGNESIUM: MAGNESIUM: 1.9 mg/dL

## 2013-05-05 LAB — AMYLASE: AMYLASE: 45 U/L (ref 25–115)

## 2013-05-12 LAB — SEDIMENTATION RATE: Erythrocyte Sed Rate: 35 mm/hr — ABNORMAL HIGH (ref 0–20)

## 2013-05-27 ENCOUNTER — Other Ambulatory Visit: Payer: Self-pay | Admitting: Nephrology

## 2013-07-13 ENCOUNTER — Other Ambulatory Visit: Payer: Self-pay | Admitting: Nephrology

## 2013-07-13 LAB — HEMOGLOBIN A1C: Hemoglobin A1C: 5 % (ref 4.2–6.3)

## 2013-07-27 ENCOUNTER — Other Ambulatory Visit: Payer: Self-pay | Admitting: Nephrology

## 2013-08-13 ENCOUNTER — Ambulatory Visit: Payer: Self-pay

## 2013-08-13 DIAGNOSIS — Z01810 Encounter for preprocedural cardiovascular examination: Secondary | ICD-10-CM

## 2013-08-17 ENCOUNTER — Encounter: Payer: Self-pay | Admitting: Surgery

## 2013-08-26 ENCOUNTER — Encounter: Payer: Self-pay | Admitting: Surgery

## 2014-02-15 ENCOUNTER — Encounter: Payer: Self-pay | Admitting: Nephrology

## 2014-02-15 LAB — CBC WITH DIFFERENTIAL/PLATELET
Basophil #: 0 10*3/uL (ref 0.0–0.1)
Basophil %: 0.7 %
Eosinophil #: 0.2 10*3/uL (ref 0.0–0.7)
Eosinophil %: 3.2 %
HCT: 32.1 % — AB (ref 35.0–47.0)
HGB: 10.2 g/dL — AB (ref 12.0–16.0)
LYMPHS PCT: 21 %
Lymphocyte #: 1.3 10*3/uL (ref 1.0–3.6)
MCH: 30.1 pg (ref 26.0–34.0)
MCHC: 31.7 g/dL — ABNORMAL LOW (ref 32.0–36.0)
MCV: 95 fL (ref 80–100)
MONO ABS: 0.5 x10 3/mm (ref 0.2–0.9)
Monocyte %: 7.4 %
NEUTROS PCT: 67.7 %
Neutrophil #: 4.1 10*3/uL (ref 1.4–6.5)
Platelet: 186 10*3/uL (ref 150–440)
RBC: 3.38 10*6/uL — ABNORMAL LOW (ref 3.80–5.20)
RDW: 14.9 % — ABNORMAL HIGH (ref 11.5–14.5)
WBC: 6.1 10*3/uL (ref 3.6–11.0)

## 2014-02-15 LAB — COMPREHENSIVE METABOLIC PANEL
ALBUMIN: 3.5 g/dL (ref 3.4–5.0)
ALT: 19 U/L
ANION GAP: 5 — AB (ref 7–16)
Alkaline Phosphatase: 77 U/L
BUN: 34 mg/dL — ABNORMAL HIGH (ref 7–18)
Bilirubin,Total: 0.4 mg/dL (ref 0.2–1.0)
CHLORIDE: 115 mmol/L — AB (ref 98–107)
CREATININE: 1.38 mg/dL — AB (ref 0.60–1.30)
Calcium, Total: 8.9 mg/dL (ref 8.5–10.1)
Co2: 20 mmol/L — ABNORMAL LOW (ref 21–32)
GFR CALC AF AMER: 54 — AB
GFR CALC NON AF AMER: 44 — AB
Glucose: 84 mg/dL (ref 65–99)
OSMOLALITY: 286 (ref 275–301)
POTASSIUM: 4.6 mmol/L (ref 3.5–5.1)
SGOT(AST): 19 U/L (ref 15–37)
Sodium: 140 mmol/L (ref 136–145)
Total Protein: 7.2 g/dL (ref 6.4–8.2)

## 2014-02-15 LAB — GAMMA GT: GGT: 6 U/L (ref 5–85)

## 2014-02-15 LAB — LIPASE, BLOOD: Lipase: 142 U/L (ref 73–393)

## 2014-02-15 LAB — MAGNESIUM: Magnesium: 2.1 mg/dL

## 2014-02-15 LAB — AMYLASE: Amylase: 49 U/L (ref 25–115)

## 2014-02-15 LAB — HEMOGLOBIN A1C: HEMOGLOBIN A1C: 4.7 % (ref 4.2–6.3)

## 2014-02-15 LAB — PHOSPHORUS: PHOSPHORUS: 3.3 mg/dL (ref 2.5–4.9)

## 2014-02-26 ENCOUNTER — Encounter: Payer: Self-pay | Admitting: Nephrology

## 2014-03-09 DIAGNOSIS — E11610 Type 2 diabetes mellitus with diabetic neuropathic arthropathy: Secondary | ICD-10-CM | POA: Insufficient documentation

## 2014-09-16 ENCOUNTER — Encounter
Admission: RE | Admit: 2014-09-16 | Discharge: 2014-09-16 | Disposition: A | Discharge status: Home / Self Care | Payer: Medicare Other | Source: Ambulatory Visit | Attending: Nephrology | Admitting: Nephrology

## 2014-09-16 DIAGNOSIS — Z940 Kidney transplant status: Secondary | ICD-10-CM | POA: Diagnosis not present

## 2014-09-16 DIAGNOSIS — E559 Vitamin D deficiency, unspecified: Secondary | ICD-10-CM | POA: Insufficient documentation

## 2014-09-16 DIAGNOSIS — Z789 Other specified health status: Secondary | ICD-10-CM | POA: Insufficient documentation

## 2014-09-16 DIAGNOSIS — Z79899 Other long term (current) drug therapy: Secondary | ICD-10-CM | POA: Diagnosis not present

## 2014-09-16 DIAGNOSIS — E1129 Type 2 diabetes mellitus with other diabetic kidney complication: Secondary | ICD-10-CM | POA: Insufficient documentation

## 2014-09-16 DIAGNOSIS — N390 Urinary tract infection, site not specified: Secondary | ICD-10-CM | POA: Insufficient documentation

## 2014-09-16 DIAGNOSIS — Z114 Encounter for screening for human immunodeficiency virus [HIV]: Secondary | ICD-10-CM | POA: Diagnosis not present

## 2014-09-16 LAB — LIPID PANEL
CHOLESTEROL: 118 mg/dL (ref 0–200)
HDL: 48 mg/dL (ref >40)
LDL CALC: 64 mg/dL (ref 0–99)
Total CHOL/HDL Ratio: 2.5 RATIO
Triglycerides: 31 mg/dL (ref <150)
VLDL: 6 mg/dL (ref 0–40)

## 2014-09-16 LAB — COMPREHENSIVE METABOLIC PANEL
ALT: 13 U/L — ABNORMAL LOW (ref 14–54)
AST: 16 U/L (ref 15–41)
Albumin: 3.1 g/dL — ABNORMAL LOW (ref 3.5–5.0)
Alkaline Phosphatase: 80 U/L (ref 38–126)
Anion gap: 5 (ref 5–15)
BUN: 25 mg/dL — ABNORMAL HIGH (ref 6–20)
CALCIUM: 8.4 mg/dL — AB (ref 8.9–10.3)
CHLORIDE: 112 mmol/L — AB (ref 101–111)
CO2: 21 mmol/L — ABNORMAL LOW (ref 22–32)
CREATININE: 1.21 mg/dL — AB (ref 0.44–1.00)
GFR, EST NON AFRICAN AMERICAN: 54 mL/min — AB (ref >60)
Glucose, Bld: 83 mg/dL (ref 65–99)
Potassium: 4.7 mmol/L (ref 3.5–5.1)
SODIUM: 138 mmol/L (ref 135–145)
TOTAL PROTEIN: 6.3 g/dL — AB (ref 6.5–8.1)
Total Bilirubin: 0.3 mg/dL (ref 0.3–1.2)

## 2014-09-16 LAB — CBC WITH DIFFERENTIAL/PLATELET
BASOS ABS: 0.1 10*3/uL (ref 0–0.1)
BASOS PCT: 1 %
EOS PCT: 3 %
Eosinophils Absolute: 0.3 10*3/uL (ref 0–0.7)
HCT: 28 % — ABNORMAL LOW (ref 35.0–47.0)
Hemoglobin: 9 g/dL — ABNORMAL LOW (ref 12.0–16.0)
LYMPHS ABS: 1.4 10*3/uL (ref 1.0–3.6)
LYMPHS PCT: 16 %
MCH: 29.1 pg (ref 26.0–34.0)
MCHC: 32.1 g/dL (ref 32.0–36.0)
MCV: 90.6 fL (ref 80.0–100.0)
Monocytes Absolute: 0.9 10*3/uL (ref 0.2–0.9)
Monocytes Relative: 11 %
NEUTROS ABS: 5.9 10*3/uL (ref 1.4–6.5)
Neutrophils Relative %: 69 %
PLATELETS: 219 10*3/uL (ref 150–440)
RBC: 3.08 MIL/uL — ABNORMAL LOW (ref 3.80–5.20)
RDW: 15.7 % — AB (ref 11.5–14.5)
WBC: 8.5 10*3/uL (ref 3.6–11.0)

## 2014-09-16 LAB — LIPASE, BLOOD: Lipase: 22 U/L (ref 22–51)

## 2014-09-16 LAB — HEMOGLOBIN A1C: Hgb A1c MFr Bld: 5.4 % (ref 4.0–6.0)

## 2014-09-16 LAB — GAMMA GT: GGT: 10 U/L (ref 7–50)

## 2014-09-16 LAB — AMYLASE: AMYLASE: 42 U/L (ref 28–100)

## 2014-09-16 LAB — PHOSPHORUS: Phosphorus: 3 mg/dL (ref 2.5–4.6)

## 2014-09-16 LAB — MAGNESIUM: MAGNESIUM: 2.3 mg/dL (ref 1.7–2.4)

## 2014-11-30 ENCOUNTER — Encounter
Admission: RE | Admit: 2014-11-30 | Discharge: 2014-11-30 | Disposition: A | Discharge status: Home / Self Care | Payer: Medicare Other | Source: Ambulatory Visit | Attending: Nephrology | Admitting: Nephrology

## 2014-11-30 DIAGNOSIS — Z940 Kidney transplant status: Secondary | ICD-10-CM | POA: Insufficient documentation

## 2014-11-30 DIAGNOSIS — N390 Urinary tract infection, site not specified: Secondary | ICD-10-CM | POA: Diagnosis not present

## 2014-11-30 DIAGNOSIS — Z79899 Other long term (current) drug therapy: Secondary | ICD-10-CM | POA: Insufficient documentation

## 2014-11-30 DIAGNOSIS — T8610 Unspecified complication of kidney transplant: Secondary | ICD-10-CM | POA: Diagnosis not present

## 2014-11-30 DIAGNOSIS — E1129 Type 2 diabetes mellitus with other diabetic kidney complication: Secondary | ICD-10-CM | POA: Insufficient documentation

## 2014-11-30 DIAGNOSIS — Z114 Encounter for screening for human immunodeficiency virus [HIV]: Secondary | ICD-10-CM | POA: Insufficient documentation

## 2014-11-30 DIAGNOSIS — Z789 Other specified health status: Secondary | ICD-10-CM | POA: Insufficient documentation

## 2014-11-30 DIAGNOSIS — Z09 Encounter for follow-up examination after completed treatment for conditions other than malignant neoplasm: Secondary | ICD-10-CM | POA: Diagnosis not present

## 2014-11-30 DIAGNOSIS — E559 Vitamin D deficiency, unspecified: Secondary | ICD-10-CM | POA: Insufficient documentation

## 2014-11-30 DIAGNOSIS — D631 Anemia in chronic kidney disease: Secondary | ICD-10-CM | POA: Insufficient documentation

## 2014-11-30 LAB — LIPID PANEL
CHOL/HDL RATIO: 3.8 ratio
CHOLESTEROL: 159 mg/dL (ref 0–200)
HDL: 42 mg/dL (ref >40)
LDL Cholesterol: 110 mg/dL — ABNORMAL HIGH (ref 0–99)
TRIGLYCERIDES: 37 mg/dL (ref <150)
VLDL: 7 mg/dL (ref 0–40)

## 2014-11-30 LAB — CBC WITH DIFFERENTIAL/PLATELET
Basophils Absolute: 0.1 10*3/uL (ref 0–0.1)
Basophils Relative: 1 %
Eosinophils Absolute: 0.8 10*3/uL — ABNORMAL HIGH (ref 0–0.7)
Eosinophils Relative: 8 %
HCT: 28.9 % — ABNORMAL LOW (ref 35.0–47.0)
HEMOGLOBIN: 9.4 g/dL — AB (ref 12.0–16.0)
Lymphocytes Relative: 13 %
Lymphs Abs: 1.3 10*3/uL (ref 1.0–3.6)
MCH: 29.9 pg (ref 26.0–34.0)
MCHC: 32.4 g/dL (ref 32.0–36.0)
MCV: 92.2 fL (ref 80.0–100.0)
MONOS PCT: 9 %
Monocytes Absolute: 0.9 10*3/uL (ref 0.2–0.9)
Neutro Abs: 6.7 10*3/uL — ABNORMAL HIGH (ref 1.4–6.5)
Neutrophils Relative %: 69 %
Platelets: 366 10*3/uL (ref 150–440)
RBC: 3.13 MIL/uL — ABNORMAL LOW (ref 3.80–5.20)
RDW: 15.1 % — ABNORMAL HIGH (ref 11.5–14.5)
WBC: 9.8 10*3/uL (ref 3.6–11.0)

## 2014-11-30 LAB — COMPREHENSIVE METABOLIC PANEL
ALK PHOS: 125 U/L (ref 38–126)
ALT: 17 U/L (ref 14–54)
AST: 19 U/L (ref 15–41)
Albumin: 3.1 g/dL — ABNORMAL LOW (ref 3.5–5.0)
Anion gap: 8 (ref 5–15)
BUN: 36 mg/dL — ABNORMAL HIGH (ref 6–20)
CHLORIDE: 118 mmol/L — AB (ref 101–111)
CO2: 14 mmol/L — AB (ref 22–32)
Calcium: 8.8 mg/dL — ABNORMAL LOW (ref 8.9–10.3)
Creatinine, Ser: 1.68 mg/dL — ABNORMAL HIGH (ref 0.44–1.00)
GFR calc Af Amer: 41 mL/min — ABNORMAL LOW (ref >60)
GFR calc non Af Amer: 36 mL/min — ABNORMAL LOW (ref >60)
GLUCOSE: 99 mg/dL (ref 65–99)
POTASSIUM: 4.4 mmol/L (ref 3.5–5.1)
SODIUM: 140 mmol/L (ref 135–145)
Total Bilirubin: 0.5 mg/dL (ref 0.3–1.2)
Total Protein: 7.2 g/dL (ref 6.5–8.1)

## 2014-11-30 LAB — PHOSPHORUS: Phosphorus: 3.8 mg/dL (ref 2.5–4.6)

## 2014-11-30 LAB — LIPASE, BLOOD: Lipase: 38 U/L (ref 22–51)

## 2014-11-30 LAB — MAGNESIUM: Magnesium: 2.3 mg/dL (ref 1.7–2.4)

## 2014-11-30 LAB — AMYLASE: Amylase: 53 U/L (ref 28–100)

## 2014-11-30 LAB — GAMMA GT: GGT: 14 U/L (ref 7–50)

## 2014-11-30 LAB — HEMOGLOBIN A1C: Hgb A1c MFr Bld: 4.3 % (ref 4.0–6.0)

## 2015-01-25 ENCOUNTER — Emergency Department: Payer: Medicare Other

## 2015-01-25 ENCOUNTER — Inpatient Hospital Stay: Payer: Medicare Other

## 2015-01-25 ENCOUNTER — Inpatient Hospital Stay
Admission: EM | Admit: 2015-01-25 | Discharge: 2015-01-26 | DRG: 313 | Disposition: A | Discharge status: Home / Self Care | Payer: Medicare Other | Attending: Internal Medicine | Admitting: Internal Medicine

## 2015-01-25 DIAGNOSIS — E782 Mixed hyperlipidemia: Secondary | ICD-10-CM | POA: Diagnosis present

## 2015-01-25 DIAGNOSIS — N183 Chronic kidney disease, stage 3 unspecified: Secondary | ICD-10-CM | POA: Diagnosis present

## 2015-01-25 DIAGNOSIS — R079 Chest pain, unspecified: Secondary | ICD-10-CM | POA: Diagnosis present

## 2015-01-25 DIAGNOSIS — E119 Type 2 diabetes mellitus without complications: Secondary | ICD-10-CM | POA: Insufficient documentation

## 2015-01-25 DIAGNOSIS — Z833 Family history of diabetes mellitus: Secondary | ICD-10-CM | POA: Diagnosis not present

## 2015-01-25 DIAGNOSIS — N185 Chronic kidney disease, stage 5: Secondary | ICD-10-CM | POA: Diagnosis present

## 2015-01-25 DIAGNOSIS — I120 Hypertensive chronic kidney disease with stage 5 chronic kidney disease or end stage renal disease: Secondary | ICD-10-CM | POA: Diagnosis present

## 2015-01-25 DIAGNOSIS — E1122 Type 2 diabetes mellitus with diabetic chronic kidney disease: Secondary | ICD-10-CM | POA: Diagnosis present

## 2015-01-25 DIAGNOSIS — R0789 Other chest pain: Secondary | ICD-10-CM

## 2015-01-25 DIAGNOSIS — E785 Hyperlipidemia, unspecified: Secondary | ICD-10-CM | POA: Diagnosis present

## 2015-01-25 DIAGNOSIS — K219 Gastro-esophageal reflux disease without esophagitis: Secondary | ICD-10-CM | POA: Diagnosis present

## 2015-01-25 DIAGNOSIS — Z886 Allergy status to analgesic agent status: Secondary | ICD-10-CM

## 2015-01-25 DIAGNOSIS — Z89429 Acquired absence of other toe(s), unspecified side: Secondary | ICD-10-CM

## 2015-01-25 DIAGNOSIS — R7989 Other specified abnormal findings of blood chemistry: Secondary | ICD-10-CM

## 2015-01-25 DIAGNOSIS — J40 Bronchitis, not specified as acute or chronic: Secondary | ICD-10-CM | POA: Diagnosis present

## 2015-01-25 DIAGNOSIS — Z940 Kidney transplant status: Secondary | ICD-10-CM | POA: Diagnosis not present

## 2015-01-25 DIAGNOSIS — D72829 Elevated white blood cell count, unspecified: Secondary | ICD-10-CM | POA: Diagnosis present

## 2015-01-25 DIAGNOSIS — Z7982 Long term (current) use of aspirin: Secondary | ICD-10-CM

## 2015-01-25 DIAGNOSIS — I10 Essential (primary) hypertension: Secondary | ICD-10-CM | POA: Diagnosis present

## 2015-01-25 HISTORY — DX: Chronic kidney disease, stage 3 unspecified: N18.30

## 2015-01-25 HISTORY — DX: Type 2 diabetes mellitus without complications: E11.9

## 2015-01-25 HISTORY — DX: Essential (primary) hypertension: I10

## 2015-01-25 HISTORY — DX: Chronic kidney disease, stage 3 (moderate): N18.3

## 2015-01-25 HISTORY — DX: Gastro-esophageal reflux disease without esophagitis: K21.9

## 2015-01-25 HISTORY — DX: Kidney transplant status: Z94.0

## 2015-01-25 LAB — COMPREHENSIVE METABOLIC PANEL
ALBUMIN: 3.8 g/dL (ref 3.5–5.0)
ALK PHOS: 110 U/L (ref 38–126)
ALT: 19 U/L (ref 14–54)
AST: 21 U/L (ref 15–41)
Anion gap: 7 (ref 5–15)
BILIRUBIN TOTAL: 0.3 mg/dL (ref 0.3–1.2)
BUN: 43 mg/dL — AB (ref 6–20)
CO2: 13 mmol/L — AB (ref 22–32)
Calcium: 9.3 mg/dL (ref 8.9–10.3)
Chloride: 118 mmol/L — ABNORMAL HIGH (ref 101–111)
Creatinine, Ser: 1.39 mg/dL — ABNORMAL HIGH (ref 0.44–1.00)
GFR calc Af Amer: 52 mL/min — ABNORMAL LOW (ref >60)
GFR calc non Af Amer: 45 mL/min — ABNORMAL LOW (ref >60)
GLUCOSE: 88 mg/dL (ref 65–99)
POTASSIUM: 4.4 mmol/L (ref 3.5–5.1)
SODIUM: 138 mmol/L (ref 135–145)
TOTAL PROTEIN: 7.3 g/dL (ref 6.5–8.1)

## 2015-01-25 LAB — CBC
HEMATOCRIT: 32.6 % — AB (ref 35.0–47.0)
Hemoglobin: 10.2 g/dL — ABNORMAL LOW (ref 12.0–16.0)
MCH: 29 pg (ref 26.0–34.0)
MCHC: 31.2 g/dL — ABNORMAL LOW (ref 32.0–36.0)
MCV: 92.8 fL (ref 80.0–100.0)
PLATELETS: 204 10*3/uL (ref 150–440)
RBC: 3.51 MIL/uL — AB (ref 3.80–5.20)
RDW: 17 % — ABNORMAL HIGH (ref 11.5–14.5)
WBC: 11.6 10*3/uL — AB (ref 3.6–11.0)

## 2015-01-25 LAB — TROPONIN I: Troponin I: <0.03 ng/mL (ref <0.031)

## 2015-01-25 LAB — PROTIME-INR
INR: 1.1
Prothrombin Time: 14.4 seconds (ref 11.4–15.0)

## 2015-01-25 LAB — FIBRIN DERIVATIVES D-DIMER (ARMC ONLY): FIBRIN DERIVATIVES D-DIMER (ARMC): 1686 — AB (ref 0–499)

## 2015-01-25 MED ORDER — HYDROMORPHONE HCL 1 MG/ML IJ SOLN
1.0000 mg | INTRAMUSCULAR | Status: DC | PRN
  Administered 2015-01-25 – 2015-01-26 (×2): 1 mg via INTRAVENOUS
  Filled 2015-01-25: qty 1

## 2015-01-25 MED ORDER — HEPARIN BOLUS VIA INFUSION
3800.0000 [IU] | Freq: Once | INTRAVENOUS | Status: AC
  Administered 2015-01-25: 3800 [IU] via INTRAVENOUS
  Filled 2015-01-25: qty 3800

## 2015-01-25 MED ORDER — HYDROMORPHONE HCL 1 MG/ML IJ SOLN
INTRAMUSCULAR | Status: AC
  Filled 2015-01-25: qty 1

## 2015-01-25 MED ORDER — HEPARIN (PORCINE) IN NACL 100-0.45 UNIT/ML-% IJ SOLN: 750.0000 [IU]/h | INTRAMUSCULAR | Status: DC

## 2015-01-25 MED ORDER — SODIUM CHLORIDE 0.9 % IV SOLN
Freq: Once | INTRAVENOUS | Status: AC
  Administered 2015-01-25: 21:00:00 via INTRAVENOUS

## 2015-01-25 MED ORDER — HEPARIN (PORCINE) IN NACL 100-0.45 UNIT/ML-% IJ SOLN: 12.0000 [IU]/kg/h | Freq: Once | INTRAMUSCULAR | Status: DC

## 2015-01-25 MED ORDER — TECHNETIUM TC 99M DIETHYLENETRIAME-PENTAACETIC ACID
31.3320 | Freq: Once | INTRAVENOUS | Status: AC | PRN
  Administered 2015-01-25: 31.332 via INTRAVENOUS

## 2015-01-25 MED ORDER — HEPARIN (PORCINE) IN NACL 100-0.45 UNIT/ML-% IJ SOLN
750.0000 [IU]/h | INTRAMUSCULAR | Status: DC
  Administered 2015-01-25: 750 [IU]/h via INTRAVENOUS
  Filled 2015-01-25: qty 250

## 2015-01-25 MED ORDER — TECHNETIUM TO 99M ALBUMIN AGGREGATED
3.9160 | Freq: Once | INTRAVENOUS | Status: AC | PRN
  Administered 2015-01-25: 3.916 via INTRAVENOUS

## 2015-01-25 MED ORDER — HEPARIN SODIUM (PORCINE) 5000 UNIT/ML IJ SOLN: 60.0000 [IU]/kg | Freq: Once | INTRAMUSCULAR | Status: AC

## 2015-01-25 MED ORDER — HYDROMORPHONE HCL 1 MG/ML IJ SOLN
1.0000 mg | Freq: Once | INTRAMUSCULAR | Status: AC
  Administered 2015-01-25: 1 mg via INTRAVENOUS
  Filled 2015-01-25: qty 1

## 2015-01-25 NOTE — Progress Notes (Signed)
ANTICOAGULATION CONSULT NOTE - Initial Consult  Pharmacy Consult for heparin drip Indication: chest pain/ACS  Allergies  Allergen Reactions  . Percocet [Oxycodone-Acetaminophen]     Confusion, dizziness    Patient Measurements: Height: 5\' 3"  (160 cm) Weight: 140 lb (63.504 kg) IBW/kg (Calculated) : 52.4 Heparin Dosing Weight: 63.5 kg  Vital Signs: Temp: 97.9 F (36.6 C) (11/29 1638) Temp Source: Oral (11/29 1638) BP: 155/77 mmHg (11/29 2120) Pulse Rate: 90 (11/29 2120)  Labs:  Recent Labs  01/25/15 1638  HGB 10.2*  HCT 32.6*  PLT 204  LABPROT 14.4  INR 1.10  CREATININE 1.39*  TROPONINI <0.03    Estimated Creatinine Clearance: 45.8 mL/min (by C-G formula based on Cr of 1.39).   Medical History: Past Medical History  Diagnosis Date  . Hypertension   . Diabetes mellitus without complication (HCC)   . GERD (gastroesophageal reflux disease)   . CKD (chronic kidney disease), stage III   . Kidney transplant recipient     Medications:  Scheduled:  . HYDROmorphone       Infusions:  . heparin 750 Units/hr (01/25/15 2111)    Assessment: Patient to be initiated on Heparin drip for ACS/chest pain. Reviewed current labs and medication history.  Goal of Therapy:  Heparin level 0.3-0.7 units/ml Monitor platelets by anticoagulation protocol: Yes   Plan:  Give 3800 units bolus x 1 Start heparin infusion at 750 units/hr Check anti-Xa level in 6 hours and daily while on heparin Continue to monitor H&H and platelets  Clovia CuffLisa Kymberley Raz, PharmD, BCPS 01/25/2015 9:53 PM

## 2015-01-25 NOTE — ED Notes (Signed)
Pt taken off floor for NM test. Pt will be transported from NM to floor after procedure. Per RN on floor family is okay to come to unit and wait in room 233 for pt to arrive.

## 2015-01-25 NOTE — ED Notes (Addendum)
Patient arrived via EMS c/o waking up with severe chest pain that radiates to her left arm and neck. EMS reported giving 4 baby aspirin, BP systolic in the 150s, O2 98%, P 90s.

## 2015-01-25 NOTE — ED Notes (Addendum)
RN called floor and was told RN was in isolation room and would call back when available.

## 2015-01-25 NOTE — ED Notes (Addendum)
Lab called about added blood tests.

## 2015-01-25 NOTE — H&P (Signed)
[facility] [place] [facility]   PATIENT NAME: Kendra FoyerBelinda Schroeder    MR#:  [id number]  DATE OF BIRTH:  12/28/1970  DATE OF ADMISSION:  01/25/2015  PRIMARY CARE PHYSICIAN: Drinda ButtsRUE,KARIN A, MD   REQUESTING/REFERRING PHYSICIAN: Mayford KnifeWilliams, MD  CHIEF COMPLAINT:   Chief Complaint  Patient presents with  . Chest Pain    HISTORY OF PRESENT ILLNESS:  Kendra BougieBelinda Lejeune  is a 44 y.o. female who presents with acute onset chest pain. Patient states she was sitting at home when her chest began hurting her, the pain radiated to her shoulders and jaw. She had associated diaphoresis as well as shortness of breath. Patient has no prior history of angina or cardiac disease. Patient does have a history of renal transplant. Initial lab workup in the ED is largely negative/benign. V/Q scan ordered to rule out PE as her d-dimer was positive. Hospitalists were called for admission.  PAST MEDICAL HISTORY:   Past Medical History  Diagnosis Date  . Hypertension   . Diabetes mellitus without complication (HCC)   . GERD (gastroesophageal reflux disease)   . CKD (chronic kidney disease), stage III   . Kidney transplant recipient     PAST SURGICAL HISTORY:   Past Surgical History  Procedure Laterality Date  . Nephrectomy transplanted organ    . Eye surgery    . Combined kidney-pancreas transplant    . Toe amputation      SOCIAL HISTORY:   Social History  Substance Use Topics  . Smoking status: Never Smoker   . Smokeless tobacco: Not on file  . Alcohol Use: No    FAMILY HISTORY:   Family History  Problem Relation Age of Onset  . Diabetes    . Cancer      DRUG ALLERGIES:   Allergies  Allergen Reactions  . Percocet [Oxycodone-Acetaminophen]     Confusion, dizziness    MEDICATIONS AT HOME:   Prior to Admission medications   Medication Sig Start Date End Date Taking? Authorizing Provider  aspirin 81 MG chewable tablet Chew 81 mg by mouth daily.   Yes Historical  Provider, MD  atorvastatin (LIPITOR) 40 MG tablet Take 40 mg by mouth at bedtime.   Yes Historical Provider, MD  calcitonin, salmon, (MIACALCIN/FORTICAL) 200 UNIT/ACT nasal spray Place 1 spray into alternate nostrils daily.   Yes Historical Provider, MD  calcium gluconate 500 MG tablet Take 1 tablet by mouth daily.   Yes Historical Provider, MD  enalapril (VASOTEC) 2.5 MG tablet Take 2.5 mg by mouth daily.   Yes Historical Provider, MD  gabapentin (NEURONTIN) 800 MG tablet Take 800 mg by mouth 2 (two) times daily.   Yes Historical Provider, MD  Multiple Vitamin (MULTIVITAMIN) tablet Take 1 tablet by mouth daily.   Yes Historical Provider, MD  MYFORTIC 180 MG EC tablet Take 540 mg by mouth 2 (two) times daily. (brand name only)   Yes Historical Provider, MD  omeprazole (PRILOSEC) 40 MG capsule Take 40 mg by mouth 2 (two) times daily.   Yes Historical Provider, MD  oxyCODONE (OXY IR/ROXICODONE) 5 MG immediate release tablet Take 5 mg by mouth every 4 (four) hours as needed for moderate pain or severe pain.    Yes Historical Provider, MD  PROGRAF 1 MG capsule Take 4 mg by mouth 2 (two) times daily.   Yes Historical Provider, MD    REVIEW OF SYSTEMS:  Review of Systems  Constitutional: Negative for fever, chills, weight loss and malaise/fatigue.  HENT:  Negative for ear pain, hearing loss and tinnitus.   Eyes: Negative for blurred vision, double vision, pain and redness.  Respiratory: Positive for shortness of breath. Negative for cough and hemoptysis.   Cardiovascular: Positive for chest pain (radiating to jaw and shoulders). Negative for palpitations, orthopnea and leg swelling.  Gastrointestinal: Negative for nausea, vomiting, abdominal pain, diarrhea and constipation.  Genitourinary: Negative for dysuria, frequency and hematuria.  Musculoskeletal: Negative for back pain, joint pain and neck pain.  Skin:       No acne, rash, or lesions  Neurological: Negative for dizziness, tremors, focal  weakness and weakness.  Endo/Heme/Allergies: Negative for polydipsia. Does not bruise/bleed easily.  Psychiatric/Behavioral: Negative for depression. The patient is not nervous/anxious and does not have insomnia.      VITAL SIGNS:   Filed Vitals:   01/25/15 1713 01/25/15 1730 01/25/15 1800 01/25/15 2120  BP:  137/73 138/74 155/77  Pulse: 86 87 86 90  Temp:      TempSrc:      Resp: Height:      Weight:      SpO2: 97% 98% 99% 97%   Wt Readings from Last 3 Encounters:  01/25/15 63.504 kg (140 lb)    PHYSICAL EXAMINATION:  Physical Exam  Vitals reviewed. Constitutional: She is oriented to person, place, and time. She appears well-developed and well-nourished. She appears distressed (appears uncomfortable).  HENT:  Head: Normocephalic and atraumatic.  Mouth/Throat: Oropharynx is clear and moist.  Eyes: Conjunctivae and EOM are normal. Pupils are equal, round, and reactive to light. No scleral icterus.  Neck: Normal range of motion. Neck supple. No JVD present. No thyromegaly present.  Cardiovascular: Normal rate, regular rhythm and intact distal pulses.  Exam reveals no gallop and no friction rub.   No murmur heard. Respiratory: Effort normal and breath sounds normal. No respiratory distress. She has no wheezes. She has no rales.  GI: Soft. Bowel sounds are normal. She exhibits no distension. There is no tenderness.  Musculoskeletal: Normal range of motion. She exhibits no edema.  No arthritis, no gout  Lymphadenopathy:    She has no cervical adenopathy.  Neurological: She is alert and oriented to person, place, and time. No cranial nerve deficit.  No dysarthria, no aphasia  Skin: Skin is warm and dry. No rash noted. No erythema.  Psychiatric: She has a normal mood and affect. Her behavior is normal. Judgment and thought content normal.    LABORATORY PANEL:   CBC  Recent Labs Lab 01/25/15 1638  WBC 11.6*  HGB 10.2*  HCT 32.6*  PLT 204    ------------------------------------------------------------------------------------------------------------------  Chemistries   Recent Labs Lab 01/25/15 1638  NA 138  K 4.4  CL 118*  CO2 13*  GLUCOSE 88  BUN 43*  CREATININE 1.39*  CALCIUM 9.3  AST 21  ALT 19  ALKPHOS 110  BILITOT 0.3   ------------------------------------------------------------------------------------------------------------------  Cardiac Enzymes  Recent Labs Lab 01/25/15 1638  TROPONINI <0.03   ------------------------------------------------------------------------------------------------------------------  RADIOLOGY:  Dg Chest Port 1 View  01/25/2015  CLINICAL DATA:  Severe chest pain radiating to left arm and neck EXAM: PORTABLE CHEST 1 VIEW COMPARISON:  07/27/2008 FINDINGS: Heart size and vascular pattern normal. Lungs clear. 2 right-sided upper extremity vascular stents noted, stable. IMPRESSION: No active disease. Electronically Signed   By: Esperanza Heir M.D.   On: 01/25/2015 17:05    EKG:   Orders placed or performed during the hospital encounter of 01/25/15  . ED EKG  .  ED EKG    IMPRESSION AND PLAN:  Principal Problem:   Chest pain - unclear etiology at this time. V/Q scan ordered to rule out PE given that she had a positive d-dimer. No prior history of clots or PE. Also no prior history of cardiac disease. Initial cardiac enzymes in the ED were negative. EKG does not suggest acute significant ischemia. We'll trend her cardiac enzymes, started heparin drip for now pending V/Q scan results. Echocardiogram ordered. If the VQ scan negative, will get cardiology consult. Active Problems:   HTN (hypertension) - controlled, continue home meds   CKD (chronic kidney disease), stage III - avoid nephrotoxins, monitor closely.   Kidney transplant recipient - continue immunosuppressive medications.   GERD (gastroesophageal reflux disease) - equivalent home dose PPI  All the records are  reviewed and case discussed with ED provider. Management plans discussed with the patient and/or family.  DVT PROPHYLAXIS: Systemic anticoagulation  ADMISSION STATUS: Inpatient  CODE STATUS: Full  TOTAL TIME TAKING CARE OF THIS PATIENT: 40 minutes.    Aly Hauser FIELDING 01/25/2015, 9:47 PM  [facility]  Office  [phone]  CC: Primary care physician; Drinda Butts, MD

## 2015-01-25 NOTE — ED Provider Notes (Signed)
[facility] Emergency Department Provider Note     Time seen: ----------------------------------------- 4:36 PM on 01/25/2015 -----------------------------------------    I have reviewed the triage vital signs and the nursing notes.   HISTORY  Chief Complaint Chest Pain    HPI Kendra Schroeder is a 44 y.o. female who presents ER for sudden onset chest pain. Patient states she was laying on her recliner and began having chest pain radiating to her neck. Nothing made the pain better or worse. She denies any fever or chills, states pain does make her short of breath and it hurts to breathe. She has not had a history of this before. According to reports she is a renal transplant patient and about a month ago had surgery on her right leg.   No past medical history on file.  There are no active problems to display for this patient.   No past surgical history on file.  Allergies Review of patient's allergies indicates not on file.  Social History Social History  Substance Use Topics  . Smoking status: Not on file  . Smokeless tobacco: Not on file  . Alcohol Use: Not on file    Review of Systems Constitutional: Negative for fever. Eyes: Negative for visual changes. ENT: Negative for sore throat. Positive for neck pain Cardiovascular: Positive for chest pain Respiratory: Positive for shortness of breath Gastrointestinal: Negative for abdominal pain, vomiting and diarrhea. Genitourinary: Negative for dysuria. Musculoskeletal: Negative for back pain. Skin: Negative for rash. Neurological: Negative for headaches, focal weakness or numbness.  10-point ROS otherwise negative.  ____________________________________________   PHYSICAL EXAM:  VITAL SIGNS: ED Triage Vitals  Enc Vitals Group     BP --      Pulse --      Resp --      Temp --      Temp src --      SpO2 --      Weight --      Height --      Head Cir --      Peak Flow --    Pain Score --      Pain Loc --      Pain Edu? --      Excl. in GC? --     Constitutional: Alert and oriented. Mild to moderate distress. Eyes: Conjunctivae are normal. PERRL. Normal extraocular movements. ENT   Head: Normocephalic and atraumatic.   Nose: No congestion/rhinnorhea.   Mouth/Throat: Mucous membranes are moist.   Neck: No stridor. Cardiovascular: Normal rate, regular rhythm. Normal and symmetric distal pulses are present in all extremities. No murmurs, rubs, or gallops. Respiratory: Normal respiratory effort without tachypnea, she appears to be splinting due to pain. Breath sounds are clear and equal bilaterally. No wheezes/rales/rhonchi. Gastrointestinal: Soft and nontender. No distention. No abdominal bruits.  Musculoskeletal: No lower extremity tenderness nor edema. Patient has a cast below the knee on the right leg Neurologic:  Normal speech and language. No gross focal neurologic deficits are appreciated. Speech is normal.  Skin:  Skin is warm, dry and intact. No rash noted. Psychiatric: Mood and affect are normal. Speech and behavior are normal. Patient exhibits appropriate insight and judgment. ____________________________________________  EKG: Interpreted by me. Normal sinus rhythm with rate 89 bpm, normal PR interval, widened QRS with, borderline [place] QT interval. Right bundle branch block.  ____________________________________________  ED COURSE:  Pertinent labs & imaging results that were available during my care of the patient were reviewed by me  and considered in my medical decision making (see chart for details). Patient appears to be very uncomfortable, unclear etiology of her symptoms this time. Complicated history with poor IV access. ____________________________________________    LABS (pertinent positives/negatives)  Labs Reviewed  CBC - Abnormal; Notable for the following:    WBC 11.6 (*)    RBC 3.51 (*)    Hemoglobin 10.2 (*)    HCT  32.6 (*)    MCHC 31.2 (*)    RDW 17.0 (*)    All other components within normal limits  COMPREHENSIVE METABOLIC PANEL - Abnormal; Notable for the following:    Chloride 118 (*)    CO2 13 (*)    BUN 43 (*)    Creatinine, Ser 1.39 (*)    GFR calc non Af Amer 45 (*)    GFR calc Af Amer 52 (*)    All other components within normal limits  FIBRIN DERIVATIVES D-DIMER ([facility] ONLY) - Abnormal; Notable for the following:    Fibrin derivatives D-dimer [facility]) 1686 (*)    All other components within normal limits  TROPONIN I  PROTIME-INR  TROPONIN I  HEPARIN LEVEL (UNFRACTIONATED)  CBC    RADIOLOGY Images were viewed by me  Chest x-ray  IMPRESSION: No active disease. ____________________________________________  FINAL ASSESSMENT AND PLAN  Chest pain  Plan: Patient with labs and imaging as dictated above. No clear etiology for the chest pain. Patient with an elevated d-dimer concerning for blood clot. I will order a VQ scan as I'm concerned about getting IV contrast with a renal transplant. We will order a second troponin will discuss with the hospitalist for admission.   Emily Filbert, MD   Emily Filbert, MD 01/25/15 [id number]

## 2015-01-26 ENCOUNTER — Inpatient Hospital Stay
Admit: 2015-01-26 | Discharge: 2015-01-26 | Disposition: A | Discharge status: Home / Self Care | Payer: Medicare Other | Attending: Internal Medicine | Admitting: Internal Medicine

## 2015-01-26 DIAGNOSIS — R079 Chest pain, unspecified: Secondary | ICD-10-CM | POA: Diagnosis not present

## 2015-01-26 LAB — CBC
HCT: 30.5 % — ABNORMAL LOW (ref 35.0–47.0)
HEMOGLOBIN: 9.5 g/dL — AB (ref 12.0–16.0)
MCH: 28.8 pg (ref 26.0–34.0)
MCHC: 31.1 g/dL — ABNORMAL LOW (ref 32.0–36.0)
MCV: 92.7 fL (ref 80.0–100.0)
Platelets: 193 10*3/uL (ref 150–440)
RBC: 3.29 MIL/uL — AB (ref 3.80–5.20)
RDW: 17.1 % — ABNORMAL HIGH (ref 11.5–14.5)
WBC: 17.1 10*3/uL — AB (ref 3.6–11.0)

## 2015-01-26 LAB — BASIC METABOLIC PANEL
Anion gap: 5 (ref 5–15)
BUN: 37 mg/dL — ABNORMAL HIGH (ref 6–20)
CHLORIDE: 120 mmol/L — AB (ref 101–111)
CO2: 13 mmol/L — ABNORMAL LOW (ref 22–32)
CREATININE: 1.22 mg/dL — AB (ref 0.44–1.00)
Calcium: 9.1 mg/dL (ref 8.9–10.3)
GFR, EST NON AFRICAN AMERICAN: 53 mL/min — AB (ref >60)
Glucose, Bld: 100 mg/dL — ABNORMAL HIGH (ref 65–99)
POTASSIUM: 5.2 mmol/L — AB (ref 3.5–5.1)
SODIUM: 138 mmol/L (ref 135–145)

## 2015-01-26 LAB — TROPONIN I
TROPONIN I: 0.03 ng/mL (ref <0.031)
Troponin I: <0.03 ng/mL (ref <0.031)
Troponin I: <0.03 ng/mL (ref <0.031)

## 2015-01-26 MED ORDER — ACETAMINOPHEN 325 MG PO TABS: 650.0000 mg | ORAL_TABLET | Freq: Four times a day (QID) | ORAL | Status: DC | PRN

## 2015-01-26 MED ORDER — AZITHROMYCIN 500 MG PO TABS: 500.0000 mg | ORAL_TABLET | Freq: Every day | ORAL | Status: DC

## 2015-01-26 MED ORDER — ALBUTEROL SULFATE HFA 108 (90 BASE) MCG/ACT IN AERS: 2.0000 | INHALATION_SPRAY | Freq: Four times a day (QID) | RESPIRATORY_TRACT | Status: AC | PRN

## 2015-01-26 MED ORDER — ONDANSETRON HCL 4 MG/2ML IJ SOLN
4.0000 mg | Freq: Four times a day (QID) | INTRAMUSCULAR | Status: DC | PRN
  Administered 2015-01-26: 4 mg via INTRAVENOUS
  Filled 2015-01-26: qty 2

## 2015-01-26 MED ORDER — TACROLIMUS 1 MG PO CAPS
4.0000 mg | ORAL_CAPSULE | Freq: Two times a day (BID) | ORAL | Status: DC
  Administered 2015-01-26 (×2): 4 mg via ORAL
  Filled 2015-01-26 (×3): qty 4

## 2015-01-26 MED ORDER — ASPIRIN 81 MG PO CHEW
81.0000 mg | CHEWABLE_TABLET | Freq: Every day | ORAL | Status: DC
  Administered 2015-01-26: 81 mg via ORAL
  Filled 2015-01-26: qty 1

## 2015-01-26 MED ORDER — INFLUENZA VAC SPLIT QUAD 0.5 ML IM SUSY
0.5000 mL | PREFILLED_SYRINGE | Freq: Once | INTRAMUSCULAR | Status: AC
  Administered 2015-01-26: 0.5 mL via INTRAMUSCULAR
  Filled 2015-01-26: qty 0.5

## 2015-01-26 MED ORDER — ATORVASTATIN CALCIUM 20 MG PO TABS
40.0000 mg | ORAL_TABLET | Freq: Every day | ORAL | Status: DC
  Administered 2015-01-26: 40 mg via ORAL
  Filled 2015-01-26: qty 2

## 2015-01-26 MED ORDER — INFLUENZA VAC SPLIT QUAD 0.5 ML IM SUSY: 0.5000 mL | PREFILLED_SYRINGE | INTRAMUSCULAR | Status: DC

## 2015-01-26 MED ORDER — OXYCODONE HCL 5 MG PO TABS: 5.0000 mg | ORAL_TABLET | ORAL | Status: DC | PRN

## 2015-01-26 MED ORDER — MYCOPHENOLATE SODIUM 180 MG PO TBEC
540.0000 mg | DELAYED_RELEASE_TABLET | Freq: Two times a day (BID) | ORAL | Status: DC
  Administered 2015-01-26 (×2): 540 mg via ORAL
  Filled 2015-01-26 (×2): qty 3

## 2015-01-26 MED ORDER — ONDANSETRON HCL 4 MG PO TABS: 4.0000 mg | ORAL_TABLET | Freq: Four times a day (QID) | ORAL | Status: DC | PRN

## 2015-01-26 MED ORDER — ENALAPRIL MALEATE 5 MG PO TABS
2.5000 mg | ORAL_TABLET | Freq: Every day | ORAL | Status: DC
Start: 2015-01-26 — End: 2015-01-26
  Administered 2015-01-26: 2.5 mg via ORAL
  Filled 2015-01-26: qty 1

## 2015-01-26 MED ORDER — SODIUM CHLORIDE 0.9 % IJ SOLN
3.0000 mL | Freq: Two times a day (BID) | INTRAMUSCULAR | Status: DC
  Administered 2015-01-26 (×2): 3 mL via INTRAVENOUS

## 2015-01-26 MED ORDER — ACETAMINOPHEN 650 MG RE SUPP: 650.0000 mg | Freq: Four times a day (QID) | RECTAL | Status: DC | PRN

## 2015-01-26 MED ORDER — PANTOPRAZOLE SODIUM 40 MG PO TBEC
40.0000 mg | DELAYED_RELEASE_TABLET | Freq: Two times a day (BID) | ORAL | Status: DC
  Administered 2015-01-26 (×2): 40 mg via ORAL
  Filled 2015-01-26 (×2): qty 1

## 2015-01-26 NOTE — Progress Notes (Signed)
*  PRELIMINARY RESULTS* Echocardiogram 2D Echocardiogram has been performed.  Georgann HousekeeperJerry R Hege 01/26/2015, 2:34 PM

## 2015-01-26 NOTE — Consult Note (Signed)
[facility] Consultation Note  Patient ID: Doreatha LewBelinda Kay Schroeder, MRN: [id number], DOB/AGE: 45/05/1970 45 y.o. Admit date: 01/25/2015   Date of Consult: 01/26/2015 Primary Physician: Drinda ButtsRUE,KARIN A, MD Primary Cardiologist: None  Chief Complaint:  Chief Complaint  Patient presents with  . Chest Pain   Reason for Consult: chest discomfort with diabetes essential hypertension  HPI: 44 y.o. female with known previous renal transplant status post chronic kidney disease stage V essential hypertension diabetes with complication with recent evidence of right foot ulcer and treatment is had severe deep chest discomfort substernal radiating into the back and right side of her chest and with no improvements with nitroglycerin and other types of medication management but with exacerbation with deep breathing slightly improved since admission to the hospital without evidence of significant new EKG changes or myocardial infarction. Patient feels slightly better today and has been on appropriate medication management for high intensity cholesterol therapy and hypertension treatment  Past Medical History  Diagnosis Date  . Hypertension   . Diabetes mellitus without complication (HCC)   . GERD (gastroesophageal reflux disease)   . CKD (chronic kidney disease), stage III   . Kidney transplant recipient       Surgical History:  Past Surgical History  Procedure Laterality Date  . Nephrectomy transplanted organ    . Eye surgery    . Combined kidney-pancreas transplant    . Toe amputation       Home Meds: Prior to Admission medications   Medication Sig Start Date End Date Taking? Authorizing Provider  aspirin 81 MG chewable tablet Chew 81 mg by mouth daily.   Yes Historical Provider, MD  atorvastatin (LIPITOR) 40 MG tablet Take 40 mg by mouth at bedtime.   Yes Historical Provider, MD  calcitonin, salmon, (MIACALCIN/FORTICAL) 200 UNIT/ACT nasal spray Place 1 spray into alternate nostrils  daily.   Yes Historical Provider, MD  calcium gluconate 500 MG tablet Take 1 tablet by mouth daily.   Yes Historical Provider, MD  enalapril (VASOTEC) 2.5 MG tablet Take 2.5 mg by mouth daily.   Yes Historical Provider, MD  gabapentin (NEURONTIN) 800 MG tablet Take 800 mg by mouth 2 (two) times daily.   Yes Historical Provider, MD  Multiple Vitamin (MULTIVITAMIN) tablet Take 1 tablet by mouth daily.   Yes Historical Provider, MD  MYFORTIC 180 MG EC tablet Take 540 mg by mouth 2 (two) times daily. (brand name only)   Yes Historical Provider, MD  omeprazole (PRILOSEC) 40 MG capsule Take 40 mg by mouth 2 (two) times daily.   Yes Historical Provider, MD  oxyCODONE (OXY IR/ROXICODONE) 5 MG immediate release tablet Take 5 mg by mouth every 4 (four) hours as needed for moderate pain or severe pain.    Yes Historical Provider, MD  PROGRAF 1 MG capsule Take 4 mg by mouth 2 (two) times daily.   Yes Historical Provider, MD  albuterol (PROVENTIL HFA;VENTOLIN HFA) 108 (90 BASE) MCG/ACT inhaler Inhale 2 puffs into the lungs every 6 (six) hours as needed for wheezing or shortness of breath. 01/26/15   Katha HammingSnehalatha Konidena, MD  azithromycin (ZITHROMAX) 500 MG tablet Take 1 tablet (500 mg total) by mouth daily. 01/26/15   Katha HammingSnehalatha Konidena, MD    Inpatient Medications:  . aspirin  81 mg Oral Daily  . atorvastatin  40 mg Oral QHS  . enalapril  2.5 mg Oral Daily  . mycophenolate  540 mg Oral BID  . pantoprazole  40 mg Oral BID  . sodium  chloride  3 mL Intravenous Q12H  . tacrolimus  4 mg Oral BID      Allergies:  Allergies  Allergen Reactions  . Percocet [Oxycodone-Acetaminophen]     Confusion, dizziness    Social History   Social History  . Marital Status: Single    Spouse Name: N/A  . Number of Children: N/A  . Years of Education: N/A   Occupational History  . Not on file.   Social History Main Topics  . Smoking status: Never Smoker   . Smokeless tobacco: Not on file  . Alcohol Use: No   . Drug Use: No  . Sexual Activity: Not on file   Other Topics Concern  . Not on file   Social History Narrative     Family History  Problem Relation Age of Onset  . Diabetes    . Cancer       Review of Systems Positive for chest discomfort and right foot pain Negative for: General:  chills, fever, night sweats or weight changes.  Cardiovascular: PND orthopnea syncope dizziness  Dermatological skin lesions rashes Respiratory: Cough congestion Urologic: Frequent urination urination at night and hematuria Abdominal: negative for nausea, vomiting, diarrhea, bright red blood per rectum, melena, or hematemesis Neurologic: negative for visual changes, and/or hearing changes  All other systems reviewed and are otherwise negative except as noted above.  Labs:  Recent Labs  01/25/15 2102 01/26/15 0019 01/26/15 0547 01/26/15 1216  TROPONINI <0.03 <0.03 <0.03 0.03   Lab Results  Component Value Date   WBC 17.1* 01/26/2015   HGB 9.5* 01/26/2015   HCT 30.5* 01/26/2015   MCV 92.7 01/26/2015   PLT 193 01/26/2015    Recent Labs Lab 01/25/15 1638 01/26/15 0547  NA 138 138  K 4.4 5.2*  CL 118* 120*  CO2 13* 13*  BUN 43* 37*  CREATININE 1.39* 1.22*  CALCIUM 9.3 9.1  PROT 7.3  --   BILITOT 0.3  --   ALKPHOS 110  --   ALT 19  --   AST 21  --   GLUCOSE 88 100*   Lab Results  Component Value Date   CHOL 159 11/30/2014   HDL 42 11/30/2014   LDLCALC 110* 11/30/2014   TRIG 37 11/30/2014   No results found for: DDIMER  Radiology/Studies:  Nm Pulmonary Perf And Vent  01/26/2015  CLINICAL DATA:  Worsening dyspnea over the past 2 weeks. Recent right lower extremity surgery. EXAM: NUCLEAR MEDICINE VENTILATION - PERFUSION LUNG SCAN TECHNIQUE: Ventilation images were obtained in multiple projections using inhaled aerosol Tc-51m DTPA. Perfusion images were obtained in multiple projections after intravenous injection of Tc-52m MAA. RADIOPHARMACEUTICALS:  31.33 millicuries  Technetium-41m DTPA aerosol inhalation and 3.9 millicuries Technetium-89m MAA IV COMPARISON:  Radiography 01/25/2015 FINDINGS: Ventilation: No focal ventilation defect. Perfusion: No segmental perfusion defects. No mismatched perfusion defects. Minimal nonsegmental perfusion irregularity is present in the lateral left lung. IMPRESSION: Low probability of pulmonary embolism. Electronically Signed   By: Ellery Plunk M.D.   On: 01/26/2015 00:03   Dg Chest Port 1 View  01/25/2015  CLINICAL DATA:  Severe chest pain radiating to left arm and neck EXAM: PORTABLE CHEST 1 VIEW COMPARISON:  07/27/2008 FINDINGS: Heart size and vascular pattern normal. Lungs clear. 2 right-sided upper extremity vascular stents noted, stable. IMPRESSION: No active disease. Electronically Signed   By: Esperanza Heir M.D.   On: 01/25/2015 17:05    EKG: Normal sinus rhythm with right bundle branch block  Weights: [employer]  01/25/15 1638 01/26/15 0006  Weight: 140 lb (63.504 kg) 153 lb 3.2 oz (69.491 kg)     Physical Exam: Blood pressure 119/63, pulse 88, temperature 98 F (36.7 C), temperature source Oral, resp. rate 18, height  (1.575 m), weight 153 lb 3.2 oz (69.491 kg), SpO2 97 %. Body mass index is 28.01 kg/(m^2). General: Well developed, well nourished, in no acute distress. Head eyes ears nose throat: Normocephalic, atraumatic, sclera non-icteric, no xanthomas, nares are without discharge. No apparent thyromegaly and/or mass  Lungs: Normal respiratory effort.  no wheezes, no rales, no rhonchi.  Heart: RRR with normal S1 S2. no murmur gallop, no rub, PMI is normal size and placement, carotid upstroke normal without bruit, jugular venous pressure is normal Abdomen: Soft, non-tender, non-distended with normoactive bowel sounds. No hepatomegaly. No rebound/guarding. No obvious abdominal masses. Abdominal aorta is normal size without bruit Extremities: No edema. no cyanosis, no clubbing, no ulcers   Peripheral : 2+ bilateral upper extremity pulses, 2+ bilateral femoral pulses, 2+ bilateral dorsal pedal pulse Neuro: Alert and oriented. No facial asymmetry. No focal deficit. Moves all extremities spontaneously. Musculoskeletal: Normal muscle tone without kyphosis Psych:  Responds to questions appropriately with a normal affect.    Assessment: 44 year old female with diabetes with complications essential hypertension mixed hyperlipidemia status post renal transplant right foot ulcer treatment with atypical deep inflammatory pleuritic type chest pain without evidence of myocardial infarction  Plan: 1. No further cardiac workup at this time due to no evidence of myocardial infarction or new EKG changes 2. Continue hypertension control with ACE inhibitor 3. High intensity cholesterol therapy with atorvastatin 4. Begin ambulation and follow for any further significant symptoms with possible discharged home with outpatient follow-up and treatment options thereafter  Signed, Lamar Blinks M.D. [facility] Card[facility] 01/26/2015, 5:16 PM

## 2015-01-26 NOTE — Care Management Obs Status (Signed)
MEDICARE OBSERVATION STATUS NOTIFICATION   Patient Details  Name: Doreatha LewBelinda Kay Voeltz MRN: [id number] Date of Birth: 03/30/1970   Medicare Observation Status Notification Given:  Yes   This was a code 5044.  documented under claim info    Eber HongGreene, Ladonya Jerkins R, RN 01/26/2015, 1:27 PM

## 2015-01-26 NOTE — Progress Notes (Signed)
Patient discharged via wheelchair and private vehicle. IV removed and catheter intact. All discharge instructions given and patient verbalizes understanding. Tele removed and returned. No prescriptions given to patient. Patient made aware that prescriptions were escribed to pharmacy No distress noted.

## 2015-01-26 NOTE — Progress Notes (Signed)
V/Q low probability for PE.  Troponin neg x3.  Will d/c heparin gtt.    [name], Kendra Schroeder FIELDING [facility] 01/26/2015, 3:38 AM

## 2015-01-26 NOTE — Care Management (Signed)
No discharge needs identified 

## 2015-01-26 NOTE — Discharge Instructions (Signed)
Aspirin and Your Heart ° Aspirin is a medicine that affects the way blood clots. Aspirin can be used to help reduce the risk of blood clots, heart attacks, and other heart-related problems.  °SHOULD I TAKE ASPIRIN? °Your health care provider will help you determine whether it is safe and beneficial for you to take aspirin daily. Taking aspirin daily may be beneficial if you: °· Have had a heart attack or chest pain. °· Have undergone open heart surgery such as coronary artery bypass surgery (CABG). °· Have had coronary angioplasty. °· Have experienced a stroke or transient ischemic attack (TIA). °· Have peripheral vascular disease (PVD). °· Have chronic heart rhythm problems such as atrial fibrillation. °ARE THERE ANY RISKS OF TAKING ASPIRIN DAILY? °Daily use of aspirin can increase your risk of side effects. Some of these include: °· Bleeding. Bleeding problems can be minor or serious. An example of a minor problem is a cut that does not stop bleeding. An example of a more serious problem is stomach bleeding or bleeding into the brain. Your risk of bleeding is increased if you are also taking non-steroidal anti-inflammatory medicine (NSAIDs). °· Increased bruising. °· Upset stomach. °· An allergic reaction. People who have nasal polyps have an increased risk of developing an aspirin allergy. °WHAT ARE SOME GUIDELINES I SHOULD FOLLOW WHEN TAKING ASPIRIN?  °· Take aspirin only as directed by your health care provider. Make sure you understand how much you should take and what form you should take. The two forms of aspirin are: °· Non-enteric-coated. This type of aspirin does not have a coating and is absorbed quickly. Non-enteric-coated aspirin is usually recommended for people with chest pain. This type of aspirin also comes in a chewable form. °· Enteric-coated. This type of aspirin has a special coating that releases the medicine very slowly. Enteric-coated aspirin causes less stomach upset than non-enteric-coated  aspirin. This type of aspirin should not be chewed or crushed. °· Drink alcohol in moderation. Drinking alcohol increases your risk of bleeding. °WHEN SHOULD I SEEK MEDICAL CARE?  °· You have unusual bleeding or bruising. °· You have stomach pain. °· You have an allergic reaction. Symptoms of an allergic reaction include: °¨ Hives. °¨ Itchy skin. °¨ Swelling of the lips, tongue, or face. °· You have ringing in your ears. °WHEN SHOULD I SEEK IMMEDIATE MEDICAL CARE?  °· Your bowel movements are bloody, dark red, or black in color. °· You vomit or cough up blood. °· You have blood in your urine. °· You cough, wheeze, or feel short of breath. °If you have any of the following symptoms, this is an emergency. Do not wait to see if the pain will go away. Get medical help at once. Call your local emergency services (911 in the [place] Do not drive yourself to the hospital. °· You have severe chest pain, especially if the pain is crushing or pressure-like and spreads to the arms, back, neck, or jaw.  °· You have stroke-like symptoms, such as:   °¨ Loss of vision.   °¨ Difficulty talking.   °¨ Numbness or weakness on one side of your body.   °¨ Numbness or weakness in your arm or leg.   °¨ Not thinking clearly or feeling confused.   °  °This information is not intended to replace advice given to you by your health care provider. Make sure you discuss any questions you have with your health care provider. °  °Document Released: 01/26/2008 Document Revised: 03/05/2014 Document Reviewed: 05/20/2013 °Elsevier Interactive Patient Education ©2016 [employer] ° °Chest Pain Observation °It is often hard to give a specific diagnosis for the cause of chest pain. Among other possibilities your symptoms might be caused by inadequate oxygen delivery to your heart (angina). Angina that is not treated or evaluated can lead to a heart attack (myocardial infarction) or death. °Blood tests, electrocardiograms, and X-rays may have been done to  help determine a possible cause of your chest pain. After evaluation and observation, your health care provider has determined that it is unlikely your pain was caused by an unstable condition that requires hospitalization. However, a full evaluation of your pain may need to be completed, with additional diagnostic testing as directed. It is very important to keep your follow-up appointments. Not keeping your follow-up appointments could result in permanent heart damage, disability, or death. If there is any problem keeping your follow-up appointments, you must call your health care provider. °HOME CARE INSTRUCTIONS  °Due to the slight chance that your pain could be angina, it is important to follow your health care provider's treatment plan and also maintain a healthy lifestyle: °· Maintain or work toward achieving a healthy weight. °· Stay physically active and exercise regularly. °· Decrease your salt intake. °· Eat a balanced, healthy diet. Talk to a dietitian to learn about heart-healthy foods. °· Increase your fiber intake by including whole grains, vegetables, fruits, and nuts in your diet. °· Avoid situations that cause stress, anger, or depression. °· Take medicines as advised by your health care provider. Report any side effects to your health care provider. Do not stop medicines or adjust the dosages on your own. °· Quit smoking. Do not use nicotine patches or gum until you check with your health care provider. °· Keep your blood pressure, blood sugar, and cholesterol levels within normal limits. °· Limit alcohol intake to no more than 1 drink per day for women who are not pregnant and 2 drinks per day for men. °· Do not abuse drugs. °SEEK IMMEDIATE MEDICAL CARE IF: °You have severe chest pain or pressure which may include symptoms such as: °· You feel pain or pressure in your arms, neck, jaw, or back. °· You have severe back or abdominal pain, feel sick to your stomach (nauseous), or throw up  (vomit). °· You are sweating profusely. °· You are having a fast or irregular heartbeat. °· You feel short of breath while at rest. °· You notice increasing shortness of breath during rest, sleep, or with activity. °· You have chest pain that does not get better after rest or after taking your usual medicine. °· You wake from sleep with chest pain. °· You are unable to sleep because you cannot breathe. °· You develop a frequent cough or you are coughing up blood. °· You feel dizzy, faint, or experience extreme fatigue. °· You develop severe weakness, dizziness, fainting, or chills. °Any of these symptoms may represent a serious problem that is an emergency. Do not wait to see if the symptoms will go away. Call your local emergency services (911 in the [place] Do not drive yourself to the hospital. °MAKE SURE YOU: °· Understand these instructions. °· Will watch your condition. °· Will get help right away if you are not doing well or get worse. °  °This information is not intended to replace advice given to you by your health care provider. Make sure you discuss any questions you have with your health care provider. °  °Document Released: 03/17/2010 Document Revised: 02/17/2013 Document Reviewed: 08/14/2012 °  Elsevier Interactive Patient Education ©2016 [employer]. ° °

## 2015-01-26 NOTE — Discharge Summary (Signed)
Kendra Schroeder, is a 44 y.o. female  DOB [birth year]  MRN [id number].  Admission date:  01/25/2015  Admitting Physician  Oralia Manis, MD  Discharge Date:  01/26/2015   Primary MD  Drinda Butts, MD  Recommendations for primary care physician for things to follow:   Follow-up with primary doctor in 1 week.   Admission Diagnosis  Elevated d-dimer [R79.1] Chest pain [R07.9] Renal transplant recipient [Z94.0]   Discharge Diagnosis  Elevated d-dimer [R79.1] Chest pain [R07.9] Renal transplant recipient [Z94.0]    Principal Problem:   Chest pain Active Problems:   GERD (gastroesophageal reflux disease)   HTN (hypertension)   CKD (chronic kidney disease), stage III   Kidney transplant recipient      Past Medical History  Diagnosis Date  . Hypertension   . Diabetes mellitus without complication (HCC)   . GERD (gastroesophageal reflux disease)   . CKD (chronic kidney disease), stage III   . Kidney transplant recipient     Past Surgical History  Procedure Laterality Date  . Nephrectomy transplanted organ    . Eye surgery    . Combined kidney-pancreas transplant    . Toe amputation         History of present illness and  Hospital Course:     Kindly see H&P for history of present illness and admission details, please review complete Labs, Consult reports and Test reports for all details in brief  HPI  from the history and physical done on the day of admission 44 year old female patient came because of chest pain, shortness of breath. Admitted for evaluation of chest pain.   Hospital Course  #1 chest pain or shortness of breath: Troponins are negative for 3 times. VQ scan negative for PE. Patient is having cough, chest pain and fever for the last 1 week. And that she has some wheezing on clinical exam.  Discharge home with azithromycin, albuterol. Did not require any further workup. troponins were  negative for 3 times. 2. Leukocytosis likely due to bronchitis: Given azithromycin prescription. #3 history of renal transplant on immunosuppressants continue them,. #4 hypertension #5 hyperlipidemia next #6 GERD ;continue home medications  Discharge Condition: stable   Follow UP      Discharge Instructions  and  Discharge Medications        Medication List    TAKE these medications        albuterol 108 (90 BASE) MCG/ACT inhaler  Commonly known as:  PROVENTIL HFA;VENTOLIN HFA  Inhale 2 puffs into the lungs every 6 (six) hours as needed for wheezing or shortness of breath.     aspirin 81 MG chewable tablet  Chew 81 mg by mouth daily.     atorvastatin 40 MG tablet  Commonly known as:  LIPITOR  Take 40 mg by mouth at bedtime.     azithromycin 500 MG tablet  Commonly known as:  ZITHROMAX  Take 1 tablet (500 mg total) by mouth daily.     calcitonin (salmon) 200 UNIT/ACT nasal spray  Commonly known as:  MIACALCIN/FORTICAL  Place 1 spray into alternate nostrils daily.     calcium gluconate 500 MG tablet  Take 1 tablet by mouth daily.     enalapril 2.5 MG tablet  Commonly known as:  VASOTEC  Take 2.5 mg by mouth daily.     gabapentin 800 MG tablet  Commonly known as:  NEURONTIN  Take 800 mg by mouth 2 (two) times daily.     multivitamin tablet  Take 1 tablet by mouth daily.     MYFORTIC 180 MG EC tablet  Generic drug:  mycophenolate  Take 540 mg by mouth 2 (two) times daily. (brand name only)     omeprazole 40 MG capsule  Commonly known as:  PRILOSEC  Take 40 mg by mouth 2 (two) times daily.     oxyCODONE 5 MG immediate release tablet  Commonly known as:  Oxy IR/ROXICODONE  Take 5 mg by mouth every 4 (four) hours as needed for moderate pain or severe pain.     PROGRAF 1 MG capsule  Generic drug:  tacrolimus  Take 4 mg by mouth 2 (two) times daily.           Diet and Activity recommendation: See Discharge Instructions above   Consults obtained - none   Major procedures and Radiology Reports - PLEASE review detailed and final reports for all details, in brief -      Nm Pulmonary Perf And Vent  01/26/2015  CLINICAL DATA:  Worsening dyspnea over the past 2 weeks. Recent right lower extremity surgery. EXAM: NUCLEAR MEDICINE VENTILATION - PERFUSION LUNG SCAN TECHNIQUE: Ventilation images were obtained in multiple projections using inhaled aerosol Tc-8892m DTPA. Perfusion images were obtained in multiple projections after intravenous injection of Tc-4092m MAA. RADIOPHARMACEUTICALS:  31.33 millicuries Technetium-6792m DTPA aerosol inhalation and 3.9 millicuries Technetium-392m MAA IV COMPARISON:  Radiography 01/25/2015 FINDINGS: Ventilation: No focal ventilation defect. Perfusion: No segmental perfusion defects. No mismatched perfusion defects. Minimal nonsegmental perfusion irregularity is present in the lateral left lung. IMPRESSION: Low probability of pulmonary embolism. Electronically Signed   By: Ellery Plunkaniel R Mitchell M.D.   On: 01/26/2015 00:03   Dg Chest Port 1 View  01/25/2015  CLINICAL DATA:  Severe chest pain radiating to left arm and neck EXAM: PORTABLE CHEST 1 VIEW COMPARISON:  07/27/2008 FINDINGS: Heart size and vascular pattern normal. Lungs clear. 2 right-sided upper extremity vascular stents noted, stable. IMPRESSION: No active disease. Electronically Signed   By: Esperanza Heiraymond  Rubner M.D.   On: 01/25/2015 17:05    Micro Results     No results found for this or any previous visit (from the past 240 hour(s)).     Today   Subjective:   Kendra Schroeder today has no headache,no chest abdominal pain,no new weakness tingling or numbness, feels much better wants to go home today.   Objective:   Blood pressure 119/63, pulse 88, temperature 98 F (36.7 C), temperature source Oral, resp. rate 18, height 5\' 2"  (1.575 m), weight 69.491 kg (153  lb 3.2 oz), SpO2 97 %.   Intake/Output Summary (Last 24 hours) at 01/26/15 1442 Last data filed at 01/26/15 0605  Gross per 24 hour  Intake      0 ml  Output    500 ml  Net   -500 ml    Exam Awake Alert, Oriented x 3, No new F.N deficits, Normal affect .AT,PERRAL Supple Neck,No JVD, No cervical lymphadenopathy appriciated.  Symmetrical Chest wall movement, Good air movement bilaterally, CTAB RRR,No Gallops,Rubs or new Murmurs, No Parasternal Heave +ve B.Sounds, Abd Soft, Non tender, No organomegaly appriciated, No rebound -guarding or rigidity. No Cyanosis, Clubbing or edema, No new Rash or bruise  Data Review   CBC w Diff: Lab Results  Component Value Date   WBC 17.1* 01/26/2015   WBC 6.1 02/15/2014   HGB 9.5* 01/26/2015   HGB 10.2* 02/15/2014   HCT 30.5* 01/26/2015   HCT 32.1* 02/15/2014   PLT 193 01/26/2015  PLT 186 02/15/2014   LYMPHOPCT 13 11/30/2014   LYMPHOPCT 21.0 02/15/2014   MONOPCT 9 11/30/2014   MONOPCT 7.4 02/15/2014   EOSPCT 8 11/30/2014   EOSPCT 3.2 02/15/2014   BASOPCT 1 11/30/2014   BASOPCT 0.7 02/15/2014    CMP: Lab Results  Component Value Date   NA 138 01/26/2015   NA 140 02/15/2014   K 5.2* 01/26/2015   K 4.6 02/15/2014   CL 120* 01/26/2015   CL 115* 02/15/2014   CO2 13* 01/26/2015   CO2 20* 02/15/2014   BUN 37* 01/26/2015   BUN 34* 02/15/2014   CREATININE 1.22* 01/26/2015   CREATININE 1.38* 02/15/2014   PROT 7.3 01/25/2015   PROT 7.2 02/15/2014   ALBUMIN 3.8 01/25/2015   ALBUMIN 3.5 02/15/2014   BILITOT 0.3 01/25/2015   BILITOT 0.4 02/15/2014   ALKPHOS 110 01/25/2015   ALKPHOS 77 02/15/2014   AST 21 01/25/2015   AST 19 02/15/2014   ALT 19 01/25/2015   ALT 19 02/15/2014  .   Total Time in preparing paper work, data evaluation and todays exam - 35 minutes  Jevonte Clanton M.D on 01/26/2015 at 2:42 PM    Note: This dictation was prepared with Dragon dictation along with smaller phrase technology. Any transcriptional  errors that result from this process are unintentional.

## 2015-05-03 ENCOUNTER — Other Ambulatory Visit
Admission: RE | Admit: 2015-05-03 | Discharge: 2015-05-03 | Disposition: A | Discharge status: Home / Self Care | Payer: Medicare Other | Source: Ambulatory Visit | Attending: Nephrology | Admitting: Nephrology

## 2015-05-03 DIAGNOSIS — T8610 Unspecified complication of kidney transplant: Secondary | ICD-10-CM | POA: Insufficient documentation

## 2015-05-03 DIAGNOSIS — Z940 Kidney transplant status: Secondary | ICD-10-CM | POA: Insufficient documentation

## 2015-05-03 DIAGNOSIS — E559 Vitamin D deficiency, unspecified: Secondary | ICD-10-CM | POA: Insufficient documentation

## 2015-05-03 DIAGNOSIS — N390 Urinary tract infection, site not specified: Secondary | ICD-10-CM | POA: Diagnosis not present

## 2015-05-03 DIAGNOSIS — Z09 Encounter for follow-up examination after completed treatment for conditions other than malignant neoplasm: Secondary | ICD-10-CM | POA: Insufficient documentation

## 2015-05-03 DIAGNOSIS — Z789 Other specified health status: Secondary | ICD-10-CM | POA: Insufficient documentation

## 2015-05-03 DIAGNOSIS — Z79899 Other long term (current) drug therapy: Secondary | ICD-10-CM | POA: Diagnosis not present

## 2015-05-03 DIAGNOSIS — E1129 Type 2 diabetes mellitus with other diabetic kidney complication: Secondary | ICD-10-CM | POA: Insufficient documentation

## 2015-05-03 DIAGNOSIS — Z114 Encounter for screening for human immunodeficiency virus [HIV]: Secondary | ICD-10-CM | POA: Insufficient documentation

## 2015-05-03 DIAGNOSIS — D631 Anemia in chronic kidney disease: Secondary | ICD-10-CM | POA: Diagnosis not present

## 2015-05-03 LAB — MAGNESIUM: Magnesium: 2.2 mg/dL (ref 1.7–2.4)

## 2015-05-03 LAB — CBC WITH DIFFERENTIAL/PLATELET
Basophils Absolute: 0 10*3/uL (ref 0–0.1)
Basophils Relative: 1 %
EOS PCT: 4 %
Eosinophils Absolute: 0.2 10*3/uL (ref 0–0.7)
HCT: 27.7 % — ABNORMAL LOW (ref 35.0–47.0)
Hemoglobin: 8.9 g/dL — ABNORMAL LOW (ref 12.0–16.0)
LYMPHS ABS: 1.3 10*3/uL (ref 1.0–3.6)
LYMPHS PCT: 22 %
MCH: 29.6 pg (ref 26.0–34.0)
MCHC: 32.2 g/dL (ref 32.0–36.0)
MCV: 91.9 fL (ref 80.0–100.0)
MONO ABS: 0.5 10*3/uL (ref 0.2–0.9)
Monocytes Relative: 9 %
Neutro Abs: 3.9 10*3/uL (ref 1.4–6.5)
Neutrophils Relative %: 64 %
PLATELETS: 229 10*3/uL (ref 150–440)
RBC: 3.01 MIL/uL — ABNORMAL LOW (ref 3.80–5.20)
RDW: 16.3 % — AB (ref 11.5–14.5)
WBC: 6 10*3/uL (ref 3.6–11.0)

## 2015-05-03 LAB — BASIC METABOLIC PANEL
ANION GAP: 6 (ref 5–15)
BUN: 37 mg/dL — ABNORMAL HIGH (ref 6–20)
CALCIUM: 8.7 mg/dL — AB (ref 8.9–10.3)
CO2: 15 mmol/L — AB (ref 22–32)
Chloride: 119 mmol/L — ABNORMAL HIGH (ref 101–111)
Creatinine, Ser: 1.6 mg/dL — ABNORMAL HIGH (ref 0.44–1.00)
GFR calc Af Amer: 44 mL/min — ABNORMAL LOW (ref >60)
GFR calc non Af Amer: 38 mL/min — ABNORMAL LOW (ref >60)
GLUCOSE: 89 mg/dL (ref 65–99)
Potassium: 4.7 mmol/L (ref 3.5–5.1)
Sodium: 140 mmol/L (ref 135–145)

## 2015-05-03 LAB — PHOSPHORUS: Phosphorus: 4 mg/dL (ref 2.5–4.6)

## 2015-05-05 LAB — TACROLIMUS LEVEL: TACROLIMUS (FK506) - LABCORP: 10.6 ng/mL (ref 2.0–20.0)

## 2015-11-02 ENCOUNTER — Other Ambulatory Visit
Admission: RE | Admit: 2015-11-02 | Discharge: 2015-11-02 | Disposition: A | Discharge status: Home / Self Care | Payer: Medicare Other | Source: Ambulatory Visit | Attending: Nephrology | Admitting: Nephrology

## 2015-11-02 DIAGNOSIS — T8610 Unspecified complication of kidney transplant: Secondary | ICD-10-CM | POA: Insufficient documentation

## 2015-11-02 DIAGNOSIS — N390 Urinary tract infection, site not specified: Secondary | ICD-10-CM | POA: Diagnosis not present

## 2015-11-02 DIAGNOSIS — D631 Anemia in chronic kidney disease: Secondary | ICD-10-CM | POA: Insufficient documentation

## 2015-11-02 DIAGNOSIS — Z114 Encounter for screening for human immunodeficiency virus [HIV]: Secondary | ICD-10-CM | POA: Insufficient documentation

## 2015-11-02 DIAGNOSIS — Z09 Encounter for follow-up examination after completed treatment for conditions other than malignant neoplasm: Secondary | ICD-10-CM | POA: Insufficient documentation

## 2015-11-02 DIAGNOSIS — Z789 Other specified health status: Secondary | ICD-10-CM | POA: Diagnosis not present

## 2015-11-02 DIAGNOSIS — Z940 Kidney transplant status: Secondary | ICD-10-CM | POA: Diagnosis present

## 2015-11-02 DIAGNOSIS — E559 Vitamin D deficiency, unspecified: Secondary | ICD-10-CM | POA: Insufficient documentation

## 2015-11-02 DIAGNOSIS — Z79899 Other long term (current) drug therapy: Secondary | ICD-10-CM | POA: Insufficient documentation

## 2015-11-02 DIAGNOSIS — E1129 Type 2 diabetes mellitus with other diabetic kidney complication: Secondary | ICD-10-CM | POA: Diagnosis not present

## 2015-11-02 LAB — BASIC METABOLIC PANEL
ANION GAP: 5 (ref 5–15)
BUN: 33 mg/dL — ABNORMAL HIGH (ref 6–20)
CALCIUM: 8.7 mg/dL — AB (ref 8.9–10.3)
CO2: 17 mmol/L — AB (ref 22–32)
Chloride: 114 mmol/L — ABNORMAL HIGH (ref 101–111)
Creatinine, Ser: 1.64 mg/dL — ABNORMAL HIGH (ref 0.44–1.00)
GFR calc Af Amer: 42 mL/min — ABNORMAL LOW (ref >60)
GFR calc non Af Amer: 37 mL/min — ABNORMAL LOW (ref >60)
GLUCOSE: 86 mg/dL (ref 65–99)
Potassium: 5 mmol/L (ref 3.5–5.1)
Sodium: 136 mmol/L (ref 135–145)

## 2015-11-02 LAB — CBC WITH DIFFERENTIAL/PLATELET
Basophils Absolute: 0 10*3/uL (ref 0–0.1)
Basophils Relative: 1 %
EOS PCT: 4 %
Eosinophils Absolute: 0.2 10*3/uL (ref 0–0.7)
HEMATOCRIT: 33.2 % — AB (ref 35.0–47.0)
HEMOGLOBIN: 10.8 g/dL — AB (ref 12.0–16.0)
LYMPHS ABS: 1.5 10*3/uL (ref 1.0–3.6)
LYMPHS PCT: 26 %
MCH: 30.2 pg (ref 26.0–34.0)
MCHC: 32.6 g/dL (ref 32.0–36.0)
MCV: 92.6 fL (ref 80.0–100.0)
Monocytes Absolute: 0.4 10*3/uL (ref 0.2–0.9)
Monocytes Relative: 8 %
NEUTROS PCT: 61 %
Neutro Abs: 3.6 10*3/uL (ref 1.4–6.5)
Platelets: 222 10*3/uL (ref 150–440)
RBC: 3.58 MIL/uL — AB (ref 3.80–5.20)
RDW: 15.4 % — ABNORMAL HIGH (ref 11.5–14.5)
WBC: 5.8 10*3/uL (ref 3.6–11.0)

## 2015-11-02 LAB — MAGNESIUM: Magnesium: 2.3 mg/dL (ref 1.7–2.4)

## 2015-11-02 LAB — PHOSPHORUS: Phosphorus: 4.2 mg/dL (ref 2.5–4.6)

## 2016-06-27 ENCOUNTER — Other Ambulatory Visit
Admission: RE | Admit: 2016-06-27 | Discharge: 2016-06-27 | Disposition: A | Discharge status: Home / Self Care | Payer: Medicare Other | Source: Ambulatory Visit | Attending: Nephrology | Admitting: Nephrology

## 2016-06-27 DIAGNOSIS — Z940 Kidney transplant status: Secondary | ICD-10-CM | POA: Insufficient documentation

## 2016-06-27 DIAGNOSIS — Z114 Encounter for screening for human immunodeficiency virus [HIV]: Secondary | ICD-10-CM | POA: Diagnosis not present

## 2016-06-27 DIAGNOSIS — E559 Vitamin D deficiency, unspecified: Secondary | ICD-10-CM | POA: Insufficient documentation

## 2016-06-27 DIAGNOSIS — Z79899 Other long term (current) drug therapy: Secondary | ICD-10-CM | POA: Insufficient documentation

## 2016-06-27 DIAGNOSIS — N390 Urinary tract infection, site not specified: Secondary | ICD-10-CM | POA: Insufficient documentation

## 2016-06-27 DIAGNOSIS — Z09 Encounter for follow-up examination after completed treatment for conditions other than malignant neoplasm: Secondary | ICD-10-CM | POA: Insufficient documentation

## 2016-06-27 DIAGNOSIS — Z789 Other specified health status: Secondary | ICD-10-CM | POA: Diagnosis not present

## 2016-06-27 DIAGNOSIS — D631 Anemia in chronic kidney disease: Secondary | ICD-10-CM | POA: Insufficient documentation

## 2016-06-27 LAB — URINALYSIS, COMPLETE (UACMP) WITH MICROSCOPIC
Bacteria, UA: NONE SEEN
Bilirubin Urine: NEGATIVE
GLUCOSE, UA: NEGATIVE mg/dL
HGB URINE DIPSTICK: NEGATIVE
KETONES UR: NEGATIVE mg/dL
Leukocytes, UA: NEGATIVE
NITRITE: NEGATIVE
PROTEIN: NEGATIVE mg/dL
Specific Gravity, Urine: 1.016 (ref 1.005–1.030)
pH: 6 (ref 5.0–8.0)

## 2016-06-28 LAB — URINE CULTURE: CULTURE: NO GROWTH

## 2016-07-10 ENCOUNTER — Encounter: Admit: 2016-07-10 | Payer: MEDICAID | Attending: Mental Health | Primary: Mental Health

## 2016-08-25 ENCOUNTER — Inpatient Hospital Stay
Admission: EM | Admit: 2016-08-25 | Discharge: 2016-09-06 | Disposition: A | Discharge status: Home Health | Payer: MEDICARE | Source: Intra-hospital

## 2016-08-25 ENCOUNTER — Inpatient Hospital Stay
Admission: EM | Admit: 2016-08-25 | Discharge: 2016-09-06 | Disposition: A | Discharge status: Home Health | Payer: MEDICARE | Source: Intra-hospital | Attending: Student in an Organized Health Care Education/Training Program | Admitting: Student in an Organized Health Care Education/Training Program

## 2016-08-25 ENCOUNTER — Inpatient Hospital Stay
Admission: EM | Admit: 2016-08-25 | Discharge: 2016-09-06 | Disposition: A | Discharge status: Home Health | Payer: MEDICARE | Source: Intra-hospital | Attending: Internal Medicine | Admitting: Internal Medicine

## 2016-08-25 ENCOUNTER — Emergency Department
Admission: EM | Admit: 2016-08-25 | Discharge: 2016-08-25 | Disposition: A | Discharge status: Home / Self Care | Payer: Medicare Other | Attending: Student in an Organized Health Care Education/Training Program | Admitting: Student in an Organized Health Care Education/Training Program

## 2016-08-25 ENCOUNTER — Emergency Department: Payer: Medicare Other

## 2016-08-25 ENCOUNTER — Encounter: Payer: Self-pay | Admitting: Emergency Medicine

## 2016-08-25 DIAGNOSIS — Z7982 Long term (current) use of aspirin: Secondary | ICD-10-CM | POA: Diagnosis not present

## 2016-08-25 DIAGNOSIS — R300 Dysuria: Secondary | ICD-10-CM | POA: Insufficient documentation

## 2016-08-25 DIAGNOSIS — E119 Type 2 diabetes mellitus without complications: Secondary | ICD-10-CM | POA: Diagnosis not present

## 2016-08-25 DIAGNOSIS — N183 Chronic kidney disease, stage 3 (moderate): Secondary | ICD-10-CM | POA: Insufficient documentation

## 2016-08-25 DIAGNOSIS — X58XXXS Exposure to other specified factors, sequela: Secondary | ICD-10-CM | POA: Insufficient documentation

## 2016-08-25 DIAGNOSIS — R2241 Localized swelling, mass and lump, right lower limb: Secondary | ICD-10-CM | POA: Diagnosis not present

## 2016-08-25 DIAGNOSIS — I129 Hypertensive chronic kidney disease with stage 1 through stage 4 chronic kidney disease, or unspecified chronic kidney disease: Secondary | ICD-10-CM | POA: Insufficient documentation

## 2016-08-25 DIAGNOSIS — R509 Fever, unspecified: Secondary | ICD-10-CM | POA: Diagnosis present

## 2016-08-25 DIAGNOSIS — Z79899 Other long term (current) drug therapy: Secondary | ICD-10-CM | POA: Diagnosis not present

## 2016-08-25 DIAGNOSIS — Z940 Kidney transplant status: Secondary | ICD-10-CM

## 2016-08-25 DIAGNOSIS — E872 Acidosis, unspecified: Secondary | ICD-10-CM

## 2016-08-25 DIAGNOSIS — A419 Sepsis, unspecified organism: Secondary | ICD-10-CM | POA: Diagnosis not present

## 2016-08-25 DIAGNOSIS — S37009S Unspecified injury of unspecified kidney, sequela: Secondary | ICD-10-CM | POA: Insufficient documentation

## 2016-08-25 DIAGNOSIS — N179 Acute kidney failure, unspecified: Secondary | ICD-10-CM

## 2016-08-25 LAB — COMPREHENSIVE METABOLIC PANEL
ALT: 11 U/L — ABNORMAL LOW (ref 14–54)
ANION GAP: 11 (ref 5–15)
AST: 18 U/L (ref 15–41)
Albumin: 3.2 g/dL — ABNORMAL LOW (ref 3.5–5.0)
Alkaline Phosphatase: 79 U/L (ref 38–126)
BILIRUBIN TOTAL: 1 mg/dL (ref 0.3–1.2)
BUN: 47 mg/dL — ABNORMAL HIGH (ref 6–20)
CO2: 10 mmol/L — ABNORMAL LOW (ref 22–32)
Calcium: 8.7 mg/dL — ABNORMAL LOW (ref 8.9–10.3)
Chloride: 110 mmol/L (ref 101–111)
Creatinine, Ser: 2.73 mg/dL — ABNORMAL HIGH (ref 0.44–1.00)
GFR, EST AFRICAN AMERICAN: 23 mL/min — AB (ref >60)
GFR, EST NON AFRICAN AMERICAN: 20 mL/min — AB (ref >60)
Glucose, Bld: 106 mg/dL — ABNORMAL HIGH (ref 65–99)
POTASSIUM: 3.9 mmol/L (ref 3.5–5.1)
Sodium: 131 mmol/L — ABNORMAL LOW (ref 135–145)
TOTAL PROTEIN: 6.9 g/dL (ref 6.5–8.1)

## 2016-08-25 LAB — URINALYSIS, COMPLETE (UACMP) WITH MICROSCOPIC
Bilirubin Urine: NEGATIVE
Glucose, UA: NEGATIVE mg/dL
HGB URINE DIPSTICK: NEGATIVE
Ketones, ur: 5 mg/dL — AB
Nitrite: NEGATIVE
Protein, ur: 100 mg/dL — AB
SPECIFIC GRAVITY, URINE: 1.024 (ref 1.005–1.030)
pH: 5 (ref 5.0–8.0)

## 2016-08-25 LAB — CBC WITH DIFFERENTIAL/PLATELET
BASOS ABS: 0 10*3/uL (ref 0–0.1)
Basophils Relative: 0 %
EOS PCT: 0 %
Eosinophils Absolute: 0 10*3/uL (ref 0–0.7)
HCT: 26 % — ABNORMAL LOW (ref 35.0–47.0)
HEMOGLOBIN: 8.4 g/dL — AB (ref 12.0–16.0)
LYMPHS ABS: 1.1 10*3/uL (ref 1.0–3.6)
LYMPHS PCT: 6 %
MCH: 28.7 pg (ref 26.0–34.0)
MCHC: 32.4 g/dL (ref 32.0–36.0)
MCV: 88.4 fL (ref 80.0–100.0)
Monocytes Absolute: 1.7 10*3/uL — ABNORMAL HIGH (ref 0.2–0.9)
Monocytes Relative: 9 %
NEUTROS PCT: 85 %
Neutro Abs: 17.5 10*3/uL — ABNORMAL HIGH (ref 1.4–6.5)
PLATELETS: 168 10*3/uL (ref 150–440)
RBC: 2.94 MIL/uL — AB (ref 3.80–5.20)
RDW: 15.6 % — ABNORMAL HIGH (ref 11.5–14.5)
WBC: 20.4 10*3/uL — AB (ref 3.6–11.0)

## 2016-08-25 LAB — TROPONIN I: TROPONIN I: 0.03 ng/mL — AB (ref <0.03)

## 2016-08-25 LAB — LACTIC ACID, PLASMA: Lactic Acid, Venous: 0.9 mmol/L (ref 0.5–1.9)

## 2016-08-25 LAB — GLUCOSE, CAPILLARY: Glucose-Capillary: 114 mg/dL — ABNORMAL HIGH (ref 65–99)

## 2016-08-25 MED ORDER — FENTANYL CITRATE (PF) 100 MCG/2ML IJ SOLN
50.0000 ug | Freq: Once | INTRAMUSCULAR | Status: AC
  Administered 2016-08-25: 50 ug via INTRAVENOUS

## 2016-08-25 MED ORDER — SODIUM BICARBONATE 8.4 % IV SOLN
50.0000 meq | Freq: Once | INTRAVENOUS | Status: AC
  Administered 2016-08-25: 50 meq via INTRAVENOUS
  Filled 2016-08-25: qty 50

## 2016-08-25 MED ORDER — SODIUM CHLORIDE 0.9 % IV SOLN
Freq: Once | INTRAVENOUS | Status: AC
  Administered 2016-08-25: 14:00:00 via INTRAVENOUS

## 2016-08-25 MED ORDER — FENTANYL CITRATE (PF) 100 MCG/2ML IJ SOLN
INTRAMUSCULAR | Status: AC
  Administered 2016-08-25: 50 ug via INTRAVENOUS
  Filled 2016-08-25: qty 2

## 2016-08-25 MED ORDER — VANCOMYCIN HCL IN DEXTROSE 1-5 GM/200ML-% IV SOLN
1000.0000 mg | Freq: Once | INTRAVENOUS | Status: AC
  Administered 2016-08-25: 1000 mg via INTRAVENOUS
  Filled 2016-08-25: qty 200

## 2016-08-25 MED ORDER — LIDOCAINE HCL (PF) 1 % IJ SOLN
INTRAMUSCULAR | Status: AC
  Administered 2016-08-25: 1 mL via INTRADERMAL
  Filled 2016-08-25: qty 5

## 2016-08-25 MED ORDER — SODIUM CHLORIDE 0.9 % IV SOLN
1.0000 g | Freq: Once | INTRAVENOUS | Status: AC
  Administered 2016-08-25: 1 g via INTRAVENOUS
  Filled 2016-08-25: qty 1

## 2016-08-25 MED ORDER — SODIUM CHLORIDE 0.9 % IV BOLUS (SEPSIS)
500.0000 mL | Freq: Once | INTRAVENOUS | Status: AC
Start: 2016-08-25 — End: 2016-08-25
  Administered 2016-08-25: 500 mL via INTRAVENOUS

## 2016-08-25 MED ORDER — LIDOCAINE HCL (PF) 1 % IJ SOLN
5.0000 mL | Freq: Once | INTRAMUSCULAR | Status: AC
  Administered 2016-08-25: 1 mL via INTRADERMAL

## 2016-08-25 MED ORDER — PIPERACILLIN-TAZOBACTAM 3.375 G IVPB 30 MIN
3.3750 g | Freq: Once | INTRAVENOUS | Status: AC
  Administered 2016-08-25: 3.375 g via INTRAVENOUS
  Filled 2016-08-25: qty 50

## 2016-08-25 NOTE — ED Triage Notes (Signed)
Patient to ER for dysuria since Wednesday with fever. Now has swelling and pain to right leg/foot. Patient has h/o kidney transplant in 2010.

## 2016-08-25 NOTE — ED Notes (Signed)
Report called - transport arranged by [facility]. Call [phone] when pt leaving

## 2016-08-25 NOTE — ED Notes (Signed)
Pt to xr - antibiotics ordered from pharmacy

## 2016-08-25 NOTE — ED Notes (Signed)
Transport team on scene - vss. Pt medicated for pain for the transport to [facility]

## 2016-08-25 NOTE — ED Notes (Signed)
bs:114

## 2016-08-25 NOTE — ED Provider Notes (Signed)
[facility] Emergency Department Provider Note    First MD Initiated Contact with Patient 08/25/16 1102     (approximate)  I have reviewed the triage vital signs and the nursing notes.   HISTORY  Chief Complaint Fever; Dysuria; and Foot Swelling    HPI Kendra Schroeder is a 46 y.o. female status post 2 kidney transplants on immunosuppression presents with 3 days of worsening dysuria decreased urine output generalized malaise and 2 days of right foot swelling and discomfort. Patient does have a history of chronic foot wound. She is febrile to 102.8 yesterday. Does not check her peptide temperature this morning. Has not been on any antibiotics recently. Denies any abdominal pain. No chest pain or shortness of breath. Patient is ill-appearing but in no respiratory distress.   Past Medical History:  Diagnosis Date  . CKD (chronic kidney disease), stage III   . Diabetes mellitus without complication (HCC)   . GERD (gastroesophageal reflux disease)   . Hypertension   . Kidney transplant recipient    Family History  Problem Relation Age of Onset  . Diabetes Unknown   . Cancer Unknown    Past Surgical History:  Procedure Laterality Date  . COMBINED KIDNEY-PANCREAS TRANSPLANT    . EYE SURGERY    . NEPHRECTOMY TRANSPLANTED ORGAN    . TOE AMPUTATION     Patient Active Problem List   Diagnosis Date Noted  . Chest pain 01/25/2015  . GERD (gastroesophageal reflux disease) 01/25/2015  . Type 2 diabetes mellitus (HCC) 01/25/2015  . HTN (hypertension) 01/25/2015  . CKD (chronic kidney disease), stage III 01/25/2015  . Kidney transplant recipient 01/25/2015      Prior to Admission medications   Medication Sig Start Date End Date Taking? Authorizing Provider  albuterol (PROVENTIL HFA;VENTOLIN HFA) 108 (90 BASE) MCG/ACT inhaler Inhale 2 puffs into the lungs every 6 (six) hours as needed for wheezing or shortness of breath. 01/26/15   Katha Hamming,  MD  aspirin 81 MG chewable tablet Chew 81 mg by mouth daily.    [provider]  atorvastatin (LIPITOR) 40 MG tablet Take 40 mg by mouth at bedtime.    [provider]  azithromycin (ZITHROMAX) 500 MG tablet Take 1 tablet (500 mg total) by mouth daily. 01/26/15   Katha Hamming, MD  calcitonin, salmon, (MIACALCIN/FORTICAL) 200 UNIT/ACT nasal spray Place 1 spray into alternate nostrils daily.    [provider]  calcium gluconate 500 MG tablet Take 1 tablet by mouth daily.    [provider]  enalapril (VASOTEC) 2.5 MG tablet Take 2.5 mg by mouth daily.    [provider]  gabapentin (NEURONTIN) 800 MG tablet Take 800 mg by mouth 2 (two) times daily.    [provider]  Multiple Vitamin (MULTIVITAMIN) tablet Take 1 tablet by mouth daily.    [provider]  MYFORTIC 180 MG EC tablet Take 540 mg by mouth 2 (two) times daily. (brand name only)    [provider]  omeprazole (PRILOSEC) 40 MG capsule Take 40 mg by mouth 2 (two) times daily.    [provider]  oxyCODONE (OXY IR/ROXICODONE) 5 MG immediate release tablet Take 5 mg by mouth every 4 (four) hours as needed for moderate pain or severe pain.     [provider]  PROGRAF 1 MG capsule Take 4 mg by mouth 2 (two) times daily.    [provider]    Allergies Percocet [oxycodone-acetaminophen]  Social History Social History  Substance Use Topics  . Smoking status: Never Smoker  . Smokeless tobacco: Never Used  . Alcohol use No    Review of Systems Patient denies headaches, rhinorrhea, blurry vision, numbness, shortness of breath, chest pain, edema, cough, abdominal pain, nausea, vomiting, diarrhea, dysuria, fevers, rashes or hallucinations unless otherwise stated above in HPI. ____________________________________________   PHYSICAL EXAM:  VITAL SIGNS: Vitals:   08/25/16 1111 08/25/16 1200  BP: 127/66 123/77  Pulse:  92    Resp: (!) 21   Temp:      Constitutional: Alert and oriented. Ill appearing but in no acute distress. Eyes: Conjunctivae are normal.  Head: Atraumatic. Nose: No congestion/rhinnorhea. Mouth/Throat: Mucous membranes are moist.   Neck: No stridor. Painless ROM.  Cardiovascular: Normal rate, regular rhythm. Grossly normal heart sounds.  Good peripheral circulation. Respiratory: Normal respiratory effort.  No retractions. Lungs CTAB. Gastrointestinal: Soft and nontender. No distention. No abdominal bruits. No CVA tenderness. Musculoskeletal: No lower extremity tenderness No joint effusions.  R>L LE swelling and pitting edema.  No crepitus or erythema,  Slightly warmer to touch.  No erythematous foot ulcer noted to right mid foot Neurologic:  Normal speech and language. No gross focal neurologic deficits are appreciated. No facial droop Skin:  Skin is warm, dry and intact. No rash noted. Psychiatric: Mood and affect are normal. Speech and behavior are normal.  ____________________________________________   LABS (all labs ordered are listed, but only abnormal results are displayed)  Results for orders placed or performed during the hospital encounter of 08/25/16 (from the past 24 hour(s))  Lactic acid, plasma     Status: None   Collection Time: 08/25/16 11:28 AM  Result Value Ref Range   Lactic Acid, Venous 0.9 0.5 - 1.9 mmol/L  Comprehensive metabolic panel     Status: Abnormal   Collection Time: 08/25/16 11:28 AM  Result Value Ref Range   Sodium 131 (L) 135 - 145 mmol/L   Potassium 3.9 3.5 - 5.1 mmol/L   Chloride 110 101 - 111 mmol/L   CO2 10 (L) 22 - 32 mmol/L   Glucose, Bld 106 (H) 65 - 99 mg/dL   BUN 47 (H) 6 - 20 mg/dL   Creatinine, Ser 1.61 (H) 0.44 - 1.00 mg/dL   Calcium 8.7 (L) 8.9 - 10.3 mg/dL   Total Protein 6.9 6.5 - 8.1 g/dL   Albumin 3.2 (L) 3.5 - 5.0 g/dL   AST 18 15 - 41 U/L   ALT 11 (L) 14 - 54 U/L   Alkaline Phosphatase 79 38 - 126 U/L   Total Bilirubin 1.0  0.3 - 1.2 mg/dL   GFR calc non Af Amer 20 (L) >60 mL/min   GFR calc Af Amer 23 (L) >60 mL/min   Anion gap 11 5 - 15  Troponin I     Status: Abnormal   Collection Time: 08/25/16 11:28 AM  Result Value Ref Range   Troponin I 0.03 (HH) <0.03 ng/mL  CBC WITH DIFFERENTIAL     Status: Abnormal   Collection Time: 08/25/16 11:28 AM  Result Value Ref Range   WBC 20.4 (H) 3.6 - 11.0 K/uL   RBC 2.94 (L) 3.80 - 5.20 MIL/uL   Hemoglobin 8.4 (L) 12.0 - 16.0 g/dL   HCT 09.6 (L) 04.5 - 40.9 %   MCV 88.4 80.0 - 100.0 fL   MCH 28.7 26.0 - 34.0 pg   MCHC 32.4 32.0 - 36.0 g/dL   RDW 81.1 (H) 91.4 -  14.5 %   Platelets 168 150 - 440 K/uL   Neutrophils Relative % 85 %   Neutro Abs 17.5 (H) 1.4 - 6.5 K/uL   Lymphocytes Relative 6 %   Lymphs Abs 1.1 1.0 - 3.6 K/uL   Monocytes Relative 9 %   Monocytes Absolute 1.7 (H) 0.2 - 0.9 K/uL   Eosinophils Relative 0 %   Eosinophils Absolute 0.0 0 - 0.7 K/uL   Basophils Relative 0 %   Basophils Absolute 0.0 0 - 0.1 K/uL  Urinalysis, Complete w Microscopic     Status: Abnormal   Collection Time: 08/25/16 11:32 AM  Result Value Ref Range   Color, Urine AMBER (A) YELLOW   APPearance TURBID (A) CLEAR   Specific Gravity, Urine 1.024 1.005 - 1.030   pH 5.0 5.0 - 8.0   Glucose, UA NEGATIVE NEGATIVE mg/dL   Hgb urine dipstick NEGATIVE NEGATIVE   Bilirubin Urine NEGATIVE NEGATIVE   Ketones, ur 5 (A) NEGATIVE mg/dL   Protein, ur 161 (A) NEGATIVE mg/dL   Nitrite NEGATIVE NEGATIVE   Leukocytes, UA MODERATE (A) NEGATIVE   RBC / HPF 6-30 0 - 5 RBC/hpf   WBC, UA TOO NUMEROUS TO COUNT 0 - 5 WBC/hpf   Bacteria, UA MANY (A) NONE SEEN   Squamous Epithelial / LPF 6-30 (A) NONE SEEN   WBC Clumps PRESENT    Mucous PRESENT    Hyaline Casts, UA PRESENT    ____________________________________________  EKG My review and personal interpretation at Time: 14:08   Indication: sepsis  Rate: 90  Rhythm: sinus Axis: normal Other: rbbb, normal intervals, no  stemi ____________________________________________  RADIOLOGY  I personally reviewed all radiographic images ordered to evaluate for the above acute complaints and reviewed radiology reports and findings.  These findings were personally discussed with the patient.  Please see medical record for radiology report.  ____________________________________________   PROCEDURES  Procedure(s) performed:  Procedures Ultrasound guided IV was placed to right EJ due to very difficult access.   Critical Care performed: yes CRITICAL CARE Performed by: Willy Eddy   Total critical care time: 50 minutes  Critical care time was exclusive of separately billable procedures and treating other patients.  Critical care was necessary to treat or prevent imminent or life-threatening deterioration.  Critical care was time spent personally by me on the following activities: development of treatment plan with patient and/or surrogate as well as nursing, discussions with consultants, evaluation of patient's response to treatment, examination of patient, obtaining history from patient or surrogate, ordering and performing treatments and interventions, ordering and review of laboratory studies, ordering and review of radiographic studies, pulse oximetry and re-evaluation of patient's condition.  ____________________________________________   INITIAL IMPRESSION / ASSESSMENT AND PLAN / ED COURSE  Pertinent labs & imaging results that were available during my care of the patient were reviewed by me and considered in my medical decision making (see chart for details).  DDX: sepsis, uti, renal failure, abscess, ulcer, nsti, dehydration, pna  Manilla Christia Domke is a 46 y.o. who presents to the ED with symptoms as described above.  Patient very ill appearing but normotensive.  No pain over the transplanted kidneys. Based on her symptoms I do suspect some component of UTI causing her sepsis. She'll be provided  IV fluids for resuscitation but will limit IV fluids given her history and as she is normotensive but do not want to put her into pulmonary edema.  The patient will be placed on continuous pulse oximetry and telemetry  for monitoring.  Laboratory evaluation will be sent to evaluate for the above complaints.     Clinical Course as of Aug 26 1418  Sat Aug 25, 2016  1303 Patient with evidence of UTI. Renal function is significantly worse than previous with an associated metabolic acidosis no anion gap. Patient meets sepsis criteria and given her immunosuppression did receive Zosyn and vancomycin. [facility] is where she gets primary amount of her care.  [PR]  1337 [facility] and management. On review and discussion with the transplant service the patient does have a history of ESBL Escherichia coli and MRSA UTIs. Will also give dose of ertapenem.  Have discussed with the patient and available family all diagnostics and treatments performed thus far and all questions were answered to the best of my ability. The patient demonstrates understanding and agreement with plan.   [PR]  1419 Blood pressure remained stable. We'll continue to monitor.  [PR]    Clinical Course User Index [PR] Willy Eddyobinson, Kinzi Frediani, MD     ____________________________________________   FINAL CLINICAL IMPRESSION(S) / ED DIAGNOSES  Final diagnoses:  Sepsis, due to unspecified organism (HCC)  AKI (acute kidney injury) (HCC)  Metabolic acidosis  Renal transplant recipient      NEW MEDICATIONS STARTED DURING THIS VISIT:  New Prescriptions   No medications on file     Note:  This document was prepared using Dragon voice recognition software and may include unintentional dictation errors.    Willy Eddyobinson, Yanelli Zapanta, MD 08/25/16 [id number]

## 2016-08-26 DIAGNOSIS — A419 Sepsis, unspecified organism: Principal | ICD-10-CM

## 2016-08-26 LAB — BLOOD CULTURE ID PANEL (REFLEXED)
Acinetobacter baumannii: NOT DETECTED
CANDIDA GLABRATA: NOT DETECTED
CANDIDA KRUSEI: NOT DETECTED
CANDIDA PARAPSILOSIS: NOT DETECTED
Candida albicans: NOT DETECTED
Candida tropicalis: NOT DETECTED
ENTEROBACTER CLOACAE COMPLEX: NOT DETECTED
ESCHERICHIA COLI: NOT DETECTED
Enterobacteriaceae species: NOT DETECTED
Enterococcus species: NOT DETECTED
Haemophilus influenzae: NOT DETECTED
KLEBSIELLA OXYTOCA: NOT DETECTED
KLEBSIELLA PNEUMONIAE: NOT DETECTED
Listeria monocytogenes: NOT DETECTED
Neisseria meningitidis: NOT DETECTED
PROTEUS SPECIES: NOT DETECTED
Pseudomonas aeruginosa: NOT DETECTED
SERRATIA MARCESCENS: NOT DETECTED
STAPHYLOCOCCUS AUREUS BCID: NOT DETECTED
STAPHYLOCOCCUS SPECIES: NOT DETECTED
STREPTOCOCCUS PNEUMONIAE: NOT DETECTED
Streptococcus agalactiae: DETECTED — AB
Streptococcus pyogenes: NOT DETECTED
Streptococcus species: DETECTED — AB

## 2016-08-26 NOTE — Progress Notes (Signed)
PHARMACY - PHYSICIAN COMMUNICATION CRITICAL VALUE ALERT - BLOOD CULTURE IDENTIFICATION (BCID)  Results for orders placed or performed during the hospital encounter of 08/25/16  Blood Culture ID Panel (Reflexed) (Collected: 08/25/2016 11:29 AM)  Result Value Ref Range   Enterococcus species NOT DETECTED NOT DETECTED   Listeria monocytogenes NOT DETECTED NOT DETECTED   Staphylococcus species NOT DETECTED NOT DETECTED   Staphylococcus aureus NOT DETECTED NOT DETECTED   Streptococcus species DETECTED (A) NOT DETECTED   Streptococcus agalactiae DETECTED (A) NOT DETECTED   Streptococcus pneumoniae NOT DETECTED NOT DETECTED   Streptococcus pyogenes NOT DETECTED NOT DETECTED   Acinetobacter baumannii NOT DETECTED NOT DETECTED   Enterobacteriaceae species NOT DETECTED NOT DETECTED   Enterobacter cloacae complex NOT DETECTED NOT DETECTED   Escherichia coli NOT DETECTED NOT DETECTED   Klebsiella oxytoca NOT DETECTED NOT DETECTED   Klebsiella pneumoniae NOT DETECTED NOT DETECTED   Proteus species NOT DETECTED NOT DETECTED   Serratia marcescens NOT DETECTED NOT DETECTED   Haemophilus influenzae NOT DETECTED NOT DETECTED   Neisseria meningitidis NOT DETECTED NOT DETECTED   Pseudomonas aeruginosa NOT DETECTED NOT DETECTED   Candida albicans NOT DETECTED NOT DETECTED   Candida glabrata NOT DETECTED NOT DETECTED   Candida krusei NOT DETECTED NOT DETECTED   Candida parapsilosis NOT DETECTED NOT DETECTED   Candida tropicalis NOT DETECTED NOT DETECTED    Name of physician (or Provider) Contacted: [facility] transplant center  Changes to prescribed antibiotics required: n/a. On vanc annd meropenem per provider.  [place] Voytko S 08/26/2016  1:55 AM

## 2016-08-27 LAB — URINE CULTURE: Culture: >=100000 — AB

## 2016-08-28 LAB — CULTURE, BLOOD (ROUTINE X 2)
SPECIAL REQUESTS: ADEQUATE
Special Requests: ADEQUATE

## 2016-08-28 NOTE — Progress Notes (Addendum)
ED Culture Report  UCx with >100k Klebsiella. [id number], RN station # [phone]. Spoke with pt's RN Raynelle FanningJulie and relayed urine culture information (UCx with >100k Klebsiella) to RN. Stated she would call back with fax number. RN also stated pt is currently on ceftriaxone, which shows as sensitive.  Addendum: RN called back with fax number [phone]) and [facility]. Confirmation printed that faxed went through.

## 2016-08-29 DIAGNOSIS — A419 Sepsis, unspecified organism: Principal | ICD-10-CM

## 2016-08-30 DIAGNOSIS — A419 Sepsis, unspecified organism: Principal | ICD-10-CM

## 2016-08-31 MED ORDER — CEFTRIAXONE IVPB 2 GRAM CONNECTOR BAG
INTRAVENOUS | 0 refills | 0.00000 days
Start: 2016-08-31 — End: 2016-09-06

## 2016-09-02 DIAGNOSIS — A419 Sepsis, unspecified organism: Principal | ICD-10-CM

## 2016-09-03 DIAGNOSIS — A419 Sepsis, unspecified organism: Principal | ICD-10-CM

## 2016-09-06 DIAGNOSIS — K3184 Gastroparesis: Secondary | ICD-10-CM

## 2016-09-06 DIAGNOSIS — B9629 Other Escherichia coli [E. coli] as the cause of diseases classified elsewhere: Secondary | ICD-10-CM

## 2016-09-06 DIAGNOSIS — M25511 Pain in right shoulder: Secondary | ICD-10-CM

## 2016-09-06 DIAGNOSIS — Z940 Kidney transplant status: Secondary | ICD-10-CM

## 2016-09-06 DIAGNOSIS — T847XXA Infection and inflammatory reaction due to other internal orthopedic prosthetic devices, implants and grafts, initial encounter: Principal | ICD-10-CM

## 2016-09-06 DIAGNOSIS — E11610 Type 2 diabetes mellitus with diabetic neuropathic arthropathy: Secondary | ICD-10-CM

## 2016-09-06 DIAGNOSIS — K224 Dyskinesia of esophagus: Secondary | ICD-10-CM

## 2016-09-06 DIAGNOSIS — Z452 Encounter for adjustment and management of vascular access device: Secondary | ICD-10-CM

## 2016-09-06 DIAGNOSIS — E1142 Type 2 diabetes mellitus with diabetic polyneuropathy: Secondary | ICD-10-CM

## 2016-09-06 DIAGNOSIS — I129 Hypertensive chronic kidney disease with stage 1 through stage 4 chronic kidney disease, or unspecified chronic kidney disease: Secondary | ICD-10-CM

## 2016-09-06 DIAGNOSIS — Z9181 History of falling: Secondary | ICD-10-CM

## 2016-09-06 DIAGNOSIS — Z9483 Pancreas transplant status: Secondary | ICD-10-CM

## 2016-09-06 DIAGNOSIS — Z792 Long term (current) use of antibiotics: Secondary | ICD-10-CM

## 2016-09-06 DIAGNOSIS — E1143 Type 2 diabetes mellitus with diabetic autonomic (poly)neuropathy: Secondary | ICD-10-CM

## 2016-09-06 DIAGNOSIS — D631 Anemia in chronic kidney disease: Secondary | ICD-10-CM

## 2016-09-06 DIAGNOSIS — Z89411 Acquired absence of right great toe: Secondary | ICD-10-CM

## 2016-09-06 DIAGNOSIS — E11319 Type 2 diabetes mellitus with unspecified diabetic retinopathy without macular edema: Secondary | ICD-10-CM

## 2016-09-06 DIAGNOSIS — B951 Streptococcus, group B, as the cause of diseases classified elsewhere: Secondary | ICD-10-CM

## 2016-09-06 DIAGNOSIS — G8929 Other chronic pain: Secondary | ICD-10-CM

## 2016-09-06 DIAGNOSIS — R1310 Dysphagia, unspecified: Secondary | ICD-10-CM

## 2016-09-06 DIAGNOSIS — E1151 Type 2 diabetes mellitus with diabetic peripheral angiopathy without gangrene: Secondary | ICD-10-CM

## 2016-09-06 DIAGNOSIS — E1122 Type 2 diabetes mellitus with diabetic chronic kidney disease: Secondary | ICD-10-CM

## 2016-09-06 DIAGNOSIS — N3000 Acute cystitis without hematuria: Secondary | ICD-10-CM

## 2016-09-06 DIAGNOSIS — R7881 Bacteremia: Secondary | ICD-10-CM

## 2016-09-06 DIAGNOSIS — N183 Chronic kidney disease, stage 3 (moderate): Secondary | ICD-10-CM

## 2016-09-06 DIAGNOSIS — K219 Gastro-esophageal reflux disease without esophagitis: Secondary | ICD-10-CM

## 2016-09-06 MED ORDER — PROGRAF 1 MG CAPSULE
ORAL_CAPSULE | ORAL | 5 refills | 0.00000 days | Status: CP
Start: 2016-09-06 — End: 2016-11-06

## 2016-09-06 MED ORDER — ALBUTEROL SULFATE HFA 90 MCG/ACTUATION AEROSOL INHALER
Freq: Four times a day (QID) | RESPIRATORY_TRACT | 0 refills | 0.00000 days | Status: CP | PRN
Start: 2016-09-06 — End: 2017-07-11

## 2016-09-06 MED ORDER — CEFTRIAXONE IVPB 2 GRAM CONNECTOR BAG
INTRAVENOUS | 0 refills | 0.00000 days | Status: CP
Start: 2016-09-06 — End: 2016-10-15

## 2016-09-07 MED ORDER — VANCOMYCIN 750 MG IN 250 ML IVPB (OVF)
INTRAVENOUS | 0 refills | 0.00000 days | Status: CP
Start: 2016-09-07 — End: 2016-10-15

## 2016-09-08 ENCOUNTER — Inpatient Hospital Stay: Admit: 2016-09-08 | Discharge: 2016-10-08 | Disposition: A | Discharge status: Home / Self Care | Payer: MEDICARE

## 2016-09-08 DIAGNOSIS — Z9181 History of falling: Secondary | ICD-10-CM

## 2016-09-08 DIAGNOSIS — Z452 Encounter for adjustment and management of vascular access device: Secondary | ICD-10-CM

## 2016-09-08 DIAGNOSIS — N3000 Acute cystitis without hematuria: Secondary | ICD-10-CM

## 2016-09-08 DIAGNOSIS — K219 Gastro-esophageal reflux disease without esophagitis: Secondary | ICD-10-CM

## 2016-09-08 DIAGNOSIS — R1310 Dysphagia, unspecified: Secondary | ICD-10-CM

## 2016-09-08 DIAGNOSIS — I129 Hypertensive chronic kidney disease with stage 1 through stage 4 chronic kidney disease, or unspecified chronic kidney disease: Secondary | ICD-10-CM

## 2016-09-08 DIAGNOSIS — G8929 Other chronic pain: Secondary | ICD-10-CM

## 2016-09-08 DIAGNOSIS — Z89411 Acquired absence of right great toe: Secondary | ICD-10-CM

## 2016-09-08 DIAGNOSIS — M25511 Pain in right shoulder: Secondary | ICD-10-CM

## 2016-09-08 DIAGNOSIS — K224 Dyskinesia of esophagus: Secondary | ICD-10-CM

## 2016-09-08 DIAGNOSIS — T847XXA Infection and inflammatory reaction due to other internal orthopedic prosthetic devices, implants and grafts, initial encounter: Principal | ICD-10-CM

## 2016-09-08 DIAGNOSIS — K3184 Gastroparesis: Secondary | ICD-10-CM

## 2016-09-08 DIAGNOSIS — E1122 Type 2 diabetes mellitus with diabetic chronic kidney disease: Secondary | ICD-10-CM

## 2016-09-08 DIAGNOSIS — Z9483 Pancreas transplant status: Secondary | ICD-10-CM

## 2016-09-08 DIAGNOSIS — B951 Streptococcus, group B, as the cause of diseases classified elsewhere: Secondary | ICD-10-CM

## 2016-09-08 DIAGNOSIS — R7881 Bacteremia: Secondary | ICD-10-CM

## 2016-09-08 DIAGNOSIS — E1151 Type 2 diabetes mellitus with diabetic peripheral angiopathy without gangrene: Secondary | ICD-10-CM

## 2016-09-08 DIAGNOSIS — E1142 Type 2 diabetes mellitus with diabetic polyneuropathy: Secondary | ICD-10-CM

## 2016-09-08 DIAGNOSIS — N183 Chronic kidney disease, stage 3 (moderate): Secondary | ICD-10-CM

## 2016-09-08 DIAGNOSIS — Z792 Long term (current) use of antibiotics: Secondary | ICD-10-CM

## 2016-09-08 DIAGNOSIS — Z940 Kidney transplant status: Secondary | ICD-10-CM

## 2016-09-08 DIAGNOSIS — D631 Anemia in chronic kidney disease: Secondary | ICD-10-CM

## 2016-09-08 DIAGNOSIS — E11610 Type 2 diabetes mellitus with diabetic neuropathic arthropathy: Secondary | ICD-10-CM

## 2016-09-08 DIAGNOSIS — B9629 Other Escherichia coli [E. coli] as the cause of diseases classified elsewhere: Secondary | ICD-10-CM

## 2016-09-08 DIAGNOSIS — E1143 Type 2 diabetes mellitus with diabetic autonomic (poly)neuropathy: Secondary | ICD-10-CM

## 2016-09-08 DIAGNOSIS — E11319 Type 2 diabetes mellitus with unspecified diabetic retinopathy without macular edema: Secondary | ICD-10-CM

## 2016-09-10 ENCOUNTER — Ambulatory Visit
Admission: RE | Admit: 2016-09-10 | Discharge: 2016-09-10 | Disposition: A | Discharge status: Home / Self Care | Payer: MEDICARE | Admitting: Internal Medicine

## 2016-09-10 DIAGNOSIS — K3184 Gastroparesis: Secondary | ICD-10-CM

## 2016-09-10 DIAGNOSIS — R1310 Dysphagia, unspecified: Secondary | ICD-10-CM

## 2016-09-10 DIAGNOSIS — B9629 Other Escherichia coli [E. coli] as the cause of diseases classified elsewhere: Secondary | ICD-10-CM

## 2016-09-10 DIAGNOSIS — Z452 Encounter for adjustment and management of vascular access device: Secondary | ICD-10-CM

## 2016-09-10 DIAGNOSIS — N183 Chronic kidney disease, stage 3 (moderate): Secondary | ICD-10-CM

## 2016-09-10 DIAGNOSIS — K224 Dyskinesia of esophagus: Secondary | ICD-10-CM

## 2016-09-10 DIAGNOSIS — E1143 Type 2 diabetes mellitus with diabetic autonomic (poly)neuropathy: Secondary | ICD-10-CM

## 2016-09-10 DIAGNOSIS — E1142 Type 2 diabetes mellitus with diabetic polyneuropathy: Secondary | ICD-10-CM

## 2016-09-10 DIAGNOSIS — I129 Hypertensive chronic kidney disease with stage 1 through stage 4 chronic kidney disease, or unspecified chronic kidney disease: Secondary | ICD-10-CM

## 2016-09-10 DIAGNOSIS — E1151 Type 2 diabetes mellitus with diabetic peripheral angiopathy without gangrene: Secondary | ICD-10-CM

## 2016-09-10 DIAGNOSIS — N3000 Acute cystitis without hematuria: Secondary | ICD-10-CM

## 2016-09-10 DIAGNOSIS — Z9181 History of falling: Secondary | ICD-10-CM

## 2016-09-10 DIAGNOSIS — K219 Gastro-esophageal reflux disease without esophagitis: Secondary | ICD-10-CM

## 2016-09-10 DIAGNOSIS — E1122 Type 2 diabetes mellitus with diabetic chronic kidney disease: Secondary | ICD-10-CM

## 2016-09-10 DIAGNOSIS — Z9483 Pancreas transplant status: Secondary | ICD-10-CM

## 2016-09-10 DIAGNOSIS — E11610 Type 2 diabetes mellitus with diabetic neuropathic arthropathy: Secondary | ICD-10-CM

## 2016-09-10 DIAGNOSIS — M869 Osteomyelitis, unspecified: Principal | ICD-10-CM

## 2016-09-10 DIAGNOSIS — E11319 Type 2 diabetes mellitus with unspecified diabetic retinopathy without macular edema: Secondary | ICD-10-CM

## 2016-09-10 DIAGNOSIS — Z792 Long term (current) use of antibiotics: Secondary | ICD-10-CM

## 2016-09-10 DIAGNOSIS — D631 Anemia in chronic kidney disease: Secondary | ICD-10-CM

## 2016-09-10 DIAGNOSIS — Z89411 Acquired absence of right great toe: Secondary | ICD-10-CM

## 2016-09-10 DIAGNOSIS — G8929 Other chronic pain: Secondary | ICD-10-CM

## 2016-09-10 DIAGNOSIS — B951 Streptococcus, group B, as the cause of diseases classified elsewhere: Secondary | ICD-10-CM

## 2016-09-10 DIAGNOSIS — T847XXA Infection and inflammatory reaction due to other internal orthopedic prosthetic devices, implants and grafts, initial encounter: Principal | ICD-10-CM

## 2016-09-10 DIAGNOSIS — R7881 Bacteremia: Secondary | ICD-10-CM

## 2016-09-10 DIAGNOSIS — Z940 Kidney transplant status: Secondary | ICD-10-CM

## 2016-09-10 DIAGNOSIS — M25511 Pain in right shoulder: Secondary | ICD-10-CM

## 2016-09-10 NOTE — Unmapped (Signed)
Physician Discharge Summary    Identifying Information:   Rachel [name]  [birth year]  [id number]    Admit date: 08/25/2016    Discharge date: 09/10/2016     Discharge Service: Nephrology (MDB)    Discharge Attending Physician: No att. 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  Will need vancomycin levels and follow up of vancomycin dosing.    Hospital Course:   46yo F with renal transplants x2, pancreatic transplant, PAD, and recurrent UTI's who presented with fever, urinary frequency and trouble voiding.     Strep bacteremia, likely 2/2 right foot/ankle wound  R plantar open foot wound that has grown MRSA, GBS, and reportedly pseudomonas in the past. Reported increased edema of foot for 3-4 days prior to admission. PVL's negative for DVT and R foot XR showing degenerative/Charcot changes, no definitive osteomyelitis. 1/2 blood cultures at OSH showed strep agalactiae. On HD 3, began having increased swelling and pain over the R ankle. MRI R foot showed fluid collection with underlying erosion concerning for inflammatory/infectious fluid collection. She was evaluated by vascular surgery for her plantar wound, recommended outpatient visit to wound care clinic. However, she was then evaluated by orthopedic surgery for her R ankle edema and erythema. Pt adamantly refused BKA, so she was instead taken to the OR for hardware exchange and placement of antibiotic beads. Cultures from ankle I&D as well as OR cultures showed only reactive cells as she had been on Abx for the below UTI prior to sampling. Per ICID, she will remain on vancomycin 750mg  daily and ceftriaxone 2g daily via home infusion until 8/20 (6 week total course).  For the [facility] term, she will need orthopedic shoes for Charcot joint on discharge.     Urinary Tract Infection, resolved  Of note pt has had multiple UTI's in the past 6 months (including E. Coli, Proteus) necessitating IV antibiotics, including an E. Coli UTI a couple weeks prior to the present admission requiring IV ertapenem x 7 days. For the current episode, she initially presented to OSH and was treated with IV antibiotics (initially vanc/zosyn). UCX at OSH speciated Klebsiella pneumonia and she was transitioned to vanc/meropenum, then transitioned to vanc/ceftriaxone.  Prolonged Abx course due to septic joint, course sufficient for UTI completed.     AKI on CKD versus worsening of CKD  On initial presentation, Cr 2.5 from baseline 1.5. Renal transplant ultrasound showed slightly increased resistive indices in main renal artery, though other resistive indices stable (mildly elevated), no hydronephrosis. However, her Cr did not significantly change during her admission despite interventions.           Procedures:  Power line insertion   PR REMOVAL OF ANKLE IMPLANT [27704] (REMOVAL OF ANKLE IMPLANT)  ______________________________________________________________________    Discharge Day Services:  BP 129/60  - Pulse 77  - Temp 36.5 ??C (Oral)  - Resp 18  - Ht 157.5 cm (5' 2)  - Wt 94.7 kg (208 lb 12.8 oz)  - LMP 09/04/2014 (Approximate)  - SpO2 95%  - BMI 38.19 kg/m??  Pt seen on the day of discharge and determined appropriate for discharge.    Condition at Discharge: fair      ______________________________________________________________________  Discharge Medications:     Your Medication List      STOP taking these medications    oxyCODONE 10 mg Tr12 12 hour crush resistant tablet  Commonly known as:  OXYCONTIN        START taking these medications    cefTRIAXone 2 g in sodium chloride 0.9 % 100 mL IVPB  Infuse 2 g into a venous catheter daily.     vancomycin 750 mg, OVERFILL 25 mL in sodium chloride 0.9% 250 mL IVPB  Infuse 750 mg into a venous catheter daily.        CHANGE how you take these medications    polyethylene glycol 17 gram/dose powder  Commonly known as:  MIRALAX  Take 17 g by mouth daily.  What changed:  ?? when to take this  ?? reasons to take this     PROGRAF 1 MG capsule  Generic drug:  tacrolimus  3mg  in the am and 4mg  in the pm, DAW1, brand medically necessary, tx date 01/31/09, Z94.0, [facility]  What changed:  additional instructions        CONTINUE taking these medications    ADULT LOW DOSE ASPIRIN 81 MG tablet  Generic drug:  aspirin  Take 81 mg by mouth daily.     albuterol 90 mcg/actuation inhaler  Commonly known as:  PROVENTIL HFA;VENTOLIN HFA  Inhale 2 puffs every six (6) hours as needed for wheezing or shortness of breath.     atorvastatin 40 MG tablet  Commonly known as:  LIPITOR  TAKE ONE TABLET BY MOUTH DAILY     calcium-vitamin D 500 mg(1,250mg ) -200 unit per tablet  Generic drug:  calcium-vitamin D  Take 500 mg by mouth daily.     cyclobenzaprine 5 MG tablet  Commonly known as:  FLEXERIL  Take 1-2 tablets (5-10 mg total) by mouth Three (3) times a day as needed for muscle spasms.     DUREZOL 0.05 % Drop  Generic drug:  difluprednate     enalapril 2.5 MG tablet  Commonly known as:  VASOTEC  2.5 mg. Patient taking every other day     furosemide 20 MG tablet  Commonly known as:  LASIX  Take 1 tablet (20 mg total) by mouth daily as needed.     leg brace Misc  Commonly known as:  KNEE SUPPORT BRACE  1 Device by Miscellaneous route daily. Left knee hinged knee brace     mycophenolate 180 MG EC tablet  Commonly known as:  MYFORTIC  Take 3 tablets (540 mg total) by mouth Two (2) times a day. Z94.0, DAW1, brand medically necessary     omeprazole 40 MG capsule  Commonly known as:  PriLOSEC  TAKE ONE CAPSULE BY MOUTH TWICE A DAY     PHARMACIST FAVORITE MULTI-VIT per tablet  Generic drug:  multivitamin  Take by mouth. Frequency:QD   Dosage:1   TABLET  Instructions:  Note:Dose: 1 TABLET     promethazine 25 mg/mL injection  Commonly known as:  PHENERGAN  One mL Every 8 hours as needed for nausea     VIGAMOX 0.5 % ophthalmic solution  Generic drug:  moxifloxacin          ______________________________________________________________________  Pending Test Results (if blank, then none):   Order Current Status    Fungal Culture Preliminary result  Fungal Culture Preliminary result          Most Recent Labs:  Microbiology Results (last day)     ** No results found for the last 24 hours. **          Lab Results   Component Value Date    WBC 10.1 09/06/2016    HGB 7.6 (L) 09/06/2016    HCT 24.5 (L) 09/06/2016    PLT 424 09/06/2016       Lab Results   Component Value Date    NA 138 09/06/2016    K 3.9 09/06/2016    CL 104 09/06/2016    CO2 24.0 09/06/2016    BUN 21 09/06/2016    CREATININE 1.56 (H) 09/06/2016    CALCIUM 7.7 (L) 09/06/2016    MG 1.9 09/06/2016    PHOS 3.7 06/08/2016       Lab Results   Component Value Date    ALKPHOS 89 08/25/2016    BILITOT 0.9 08/25/2016    BILIDIR <0.10 07/02/2016    PROT 5.9 (L) 08/25/2016    ALBUMIN 2.8 (L) 08/25/2016    ALT 21 08/25/2016    AST 12 (L) 08/25/2016    GGT <10 (L) 06/08/2016       Lab Results   Component Value Date    PT 11.5 08/26/2016    INR 1.01 08/26/2016    APTT 29.1 08/25/2016     Hospital Radiology:  US Renal Transplant W Doppler    Result Date: 08/26/2016  EXAM: US RENAL TRANSPLANT W DOPPLER DATE: 08/25/2016 10:54 PM ACCESSION: [id number] UN DICTATED: 08/25/2016 10:55 PM INTERPRETATION LOCATION: [facility] CLINICAL INDICATION: 46 years old Female with urosepsis, renal transplant x2 COMPARISON: Renal transplant ultrasound 06/08/2016 TECHNIQUE:  Ultrasound views of the renal transplant were obtained using gray scale and color and spectral Doppler imaging. FINDINGS: TRANSPLANTED KIDNEY: The renal transplant was located in the left lower quadrant. Normal size and echogenicity. No solid masses or calculi. No perinephric collections identified. Trace pelviectasis. No hydronephrosis. VESSELS: - Perfusion: Using power Doppler, normal perfusion was seen throughout the renal parenchyma. - Resistive indices in the main renal artery were slightly increased compared to prior. The segmental and arcuate artery resistive indices are stable compared with prior examination. - Main renal artery/iliac artery: Patent. The main renal artery has two branches. - Main renal vein/iliac vein: Patent BLADDER: Decompressed.      -- Slightly increased resistive indices in the main renal artery compared to 06/08/2016. Remaining resistive indices are stable and remain mildly elevated. -- No hydronephrosis. Please see below for data measurements: LLQ kidney: length 9.2 cm; width 5.5 cm; height 5.6 cm Arcuate artery superior resistive indices: 0.86 (previously 0.80) Arcuate artery mid resistive indices: 0.84 (previously 0.83) Arcuate artery inferior resistive indices: 0.79 (previously 0.81) Segmental artery superior resistive indices: 0.75 (previously 0.76) Segmental artery mid resistive indices: 0.90 (previously 0.81) Segmental artery inferior resistive indices: 0.84 (previously 0.85) Main renal artery resistive indices: 0.88-1.0 (previously 0.81-0.91) Main renal vein: patent Iliac artery:    Patent Iliac vein:    Patent      Mri Lower Extremity Non-joint Right Wo Contrast    Result Date: 08/26/2016  EXAM: MRI LOWER EXTREMITY NON-JOINT RIGHT WO CONTRAST DATE: 08/26/2016 8:39 AM ACCESSION: [id number] UN DICTATED: 08/26/2016 9:34 AM INTERPRETATION LOCATION: [facility] CLINICAL INDICATION: 46 years old Female with ANKLE \\T \ FOOT OSTEOMYELITIS--  COMPARISON:  The most recent radiographs of the foot are dated 06/14/2016, and there are no current radiographs available for  comparison. TECHNIQUE: MRI of the  right foot was performed using a local coil.  Multisequence, multiplanar imaging was performed.  Because of the patient's underlying renal insufficiency, the protocol unspecified without administration of contrast. FINDINGS: There are fixation screws transfixing the subtalar joint, an intramedullary rod traverses the distal tibia, talus and extends into the calcaneus.  The fixation hardware produces a moderate degree of streak artifact.  The distal fibula has been excised.  A prominent T2 hyperintense collection is present anterior to the peroneus longus and brevis tendons just distal to the remnant of the distal fibula, and this fluid collection extends down to the lateral margin of the talus and to the underlying intramedullary rod.  There also appears to be a fluid collection extending across the subtalar joint and small recess of fluid along the medial aspect, plantar to the medial margin of the talus.  There is erosion of the superior margin of the calcaneus adjacent to the lateral fluid collection.  The exact extent of this fluid collection and osseous erosion is difficult to follow because of susceptibility artifact, but the collection does appear to extend to erode the posterior aspect of the distal tibia remnant at the site of previous attempted tibiotalar arthrodesis. There is extensive deformity of the midfoot and forefoot consistent with previous trauma and surgery.  There is no other abnormal signal intensity to suggest forefoot osteomyelitis in the remaining bones.  There is diffuse T2 infiltration of the plantar soft tissues extending toward the forefoot, but there is no other focal fluid collection      Previous surgery and internal fixation at the hindfoot, subtalar joint and tibiotalar joint with associated susceptibility artifact. Prominent fluid collection along the lateral aspect of the distal tibia and hindfoot, distal to the fibular stump and anterior to the peroneus tendons extending down to and associated with erosion of the talus and probably the distal tibia and calcaneus.  The findings suggest an inflammatory/infectious fluid collection and associated erosion.  Contrast could not be administered.        ______________________________________________________________________    Discharge Instructions:   Activity Instructions     Activity as tolerated                     Follow Up instructions and Outpatient Referrals     Referral to Home Infusion       Performing location?:  [facility] Requested Disciplines:   Nursing  Physical Therapy  Occupational Therapy       **Please contact your service pharmacist for assistance with discharge home health infusion monitoring. Please see link above for Laboratory Monitoring Guidelines.     Home Health/Home Infusion Orders:     Physical Therapy: Please evaluate and treat. NOTE: Weightbearing: full weightbearing  Occupational Therapy: Please evaluate and treat     Skilled Nursing:  Wound care/dressing changes:    Comments   ??  1. Cleanse foot wound with normal saline and 4 x 4 gauze, pat dry.  2. Use 77M Cavilon no sting film barrier wipe (161096) to periwound and let dry 15 seconds   3. Apply Silver Wound Gel (045409) to cover wound surface   4. Cover with 4 x 4 gauze, ABD pad, or other appropriate cover dressing ??  5. Secure dressing appropriately.  ??  HH Skilled Nurse to teach patient/caregiver the administration of home infusion therapy as ordered.   Please teach/review the following, as applicable: how to flush and maintain IV  line, medication bag change, storage/disposal of medication/supplies, and inventory control.  Monitor for signs and symptoms of infection, phlebitis, dislodgement/catheter migration, and occlusion of IV line. Instruct to keep IV site clean, dry and intact.   Review medication side effects.   Teach how to troubleshoot device/IV line function and who to call if dressing gets dirty, wet, or lifts off.   Review and provide the patient/caregiver with contact information regarding who to contact within your agency for questions, concerns or issues.    CVAD Dressing Change:  Please change dressing weekly/prn using sterile CVAD kit/transparent dressing.   Please change Biopatch with CVAD dressing change.   Change the stabilization device (STATLOCK) as applicable with each dressing change.  Please change needleless cap at least weekly with dressing change and prn, and after every blood draw, using aseptic technique.    Note - Generic substitutions permissible by Home Infusion agencies.    IV Access:  Line Type: Right Femoral Vein Power line              Lumens: single    Placement Date: 08/29/16    Special Instructions:     Flush Orders:   Broviac/Hickman/Apheresis Cath /Powerline Adult Patient Broviac/Hickman/Apheresis Cath/Powerline Adult Patient:  Flush with 5 mLs of Normal Saline before and after each use and prn line maintenance. Use after blood draws. Flush with Heparin 100units/mL 2 mLs at the end of each use and prn for line maintenance. Please change needleless cap weekly and after blood draws, or prn using aseptic technique. Please flush unused lumen(s) with Heparin 100units/mL 2 mLs Monday-Wednesday-Friday.    LABS:    Follow-up Labs:   Please draw the following labs [facility] preferred)    Labs: weekly CBC with differential, ESR, CRP, vancomycin trough, BUN, and serum creatinine  Please obtain 1st vanc trough before AM dose on either Saturday 7/14 OR Monday 7/16    Please fax labs to patient's transplant coordinator: Ambrose Pancoast at [phone] (Kidney/Pancreas)  Antibiotics:   a. Vancomycin       Antibiotic End Date: Date 10/15/16  b. Ceftriaxone        Antibiotic End Date: Date 10/15/16         Call MD for:  persistent dizziness or light-headedness       Call MD for:  persistent nausea or vomiting       Call MD for:  temperature >38.5 Celsius       Discharge instructions       We have changed your prograf dosing to 3mg  in the morning and 4mg  in the evenings.   You will continue your home antibiotic infusions for a total of 6 weeks.     Keep your follow up appointment with transplant nephrology on 8/15 and your appointment with ID on 8/10.               Appointments which have been scheduled for you    Sep 10, 2016 To Be Determined  SN - HOME VISIT with Pecola Leisure, RN  [facility] AND HOSPICE [facility]) [address]  [address]  [place] [address]  [phone]   Sep 17, 2016 To Be Determined  SN - HOME VISIT with Priscella Mann, RN  [facility] AND HOSPICE [facility]) [address]  [address]  [place] [address]  [phone]   Oct 05, 2016  9:30 AM EDT  RETURN INFECTIOUS DISEASE with Park Breed, MD  [facility] TRANSPLANT INFECTIOUS DISEASES [facility]) [address]  [place] [address]  [phone]   Oct 10, 2016  7:45 AM EDT  LAB ONLY with LAB WALK-IN ACC  LAB PHLEB 1ST [facility]) [address] [place] [address]  [phone]   Oct 10, 2016  8:20 AM EDT  RETURN NEPHROLOGY POST with Brayton Caves, MD  [facility] KIDNEY TRANSPLANT [facility] [address] [place] [facility] REGION) [address]  [place] [address]  [phone]   Jun 27, 2017 10:00 AM EDT  MEDICARE Fenton Malling with Theora Master, LCSW  [facility] FAMILY MEDICINE [facility] REGION) [address] [place] [address]  [phone]          Length of Discharge: I spent greater than 30 mins in the discharge of this patient.

## 2016-09-13 DIAGNOSIS — R1310 Dysphagia, unspecified: Secondary | ICD-10-CM

## 2016-09-13 DIAGNOSIS — Z89411 Acquired absence of right great toe: Secondary | ICD-10-CM

## 2016-09-13 DIAGNOSIS — K3184 Gastroparesis: Secondary | ICD-10-CM

## 2016-09-13 DIAGNOSIS — Z452 Encounter for adjustment and management of vascular access device: Secondary | ICD-10-CM

## 2016-09-13 DIAGNOSIS — E1142 Type 2 diabetes mellitus with diabetic polyneuropathy: Secondary | ICD-10-CM

## 2016-09-13 DIAGNOSIS — G8929 Other chronic pain: Secondary | ICD-10-CM

## 2016-09-13 DIAGNOSIS — N183 Chronic kidney disease, stage 3 (moderate): Secondary | ICD-10-CM

## 2016-09-13 DIAGNOSIS — Z9181 History of falling: Secondary | ICD-10-CM

## 2016-09-13 DIAGNOSIS — E1151 Type 2 diabetes mellitus with diabetic peripheral angiopathy without gangrene: Secondary | ICD-10-CM

## 2016-09-13 DIAGNOSIS — R7881 Bacteremia: Secondary | ICD-10-CM

## 2016-09-13 DIAGNOSIS — Z940 Kidney transplant status: Secondary | ICD-10-CM

## 2016-09-13 DIAGNOSIS — B951 Streptococcus, group B, as the cause of diseases classified elsewhere: Secondary | ICD-10-CM

## 2016-09-13 DIAGNOSIS — E1143 Type 2 diabetes mellitus with diabetic autonomic (poly)neuropathy: Secondary | ICD-10-CM

## 2016-09-13 DIAGNOSIS — K224 Dyskinesia of esophagus: Secondary | ICD-10-CM

## 2016-09-13 DIAGNOSIS — Z9483 Pancreas transplant status: Secondary | ICD-10-CM

## 2016-09-13 DIAGNOSIS — E11610 Type 2 diabetes mellitus with diabetic neuropathic arthropathy: Secondary | ICD-10-CM

## 2016-09-13 DIAGNOSIS — E1122 Type 2 diabetes mellitus with diabetic chronic kidney disease: Secondary | ICD-10-CM

## 2016-09-13 DIAGNOSIS — E11319 Type 2 diabetes mellitus with unspecified diabetic retinopathy without macular edema: Secondary | ICD-10-CM

## 2016-09-13 DIAGNOSIS — Z792 Long term (current) use of antibiotics: Secondary | ICD-10-CM

## 2016-09-13 DIAGNOSIS — B9629 Other Escherichia coli [E. coli] as the cause of diseases classified elsewhere: Secondary | ICD-10-CM

## 2016-09-13 DIAGNOSIS — I129 Hypertensive chronic kidney disease with stage 1 through stage 4 chronic kidney disease, or unspecified chronic kidney disease: Secondary | ICD-10-CM

## 2016-09-13 DIAGNOSIS — N3000 Acute cystitis without hematuria: Secondary | ICD-10-CM

## 2016-09-13 DIAGNOSIS — K219 Gastro-esophageal reflux disease without esophagitis: Secondary | ICD-10-CM

## 2016-09-13 DIAGNOSIS — D631 Anemia in chronic kidney disease: Secondary | ICD-10-CM

## 2016-09-13 DIAGNOSIS — T847XXA Infection and inflammatory reaction due to other internal orthopedic prosthetic devices, implants and grafts, initial encounter: Principal | ICD-10-CM

## 2016-09-13 DIAGNOSIS — M25511 Pain in right shoulder: Secondary | ICD-10-CM

## 2016-09-14 DIAGNOSIS — K219 Gastro-esophageal reflux disease without esophagitis: Secondary | ICD-10-CM

## 2016-09-14 DIAGNOSIS — T847XXA Infection and inflammatory reaction due to other internal orthopedic prosthetic devices, implants and grafts, initial encounter: Principal | ICD-10-CM

## 2016-09-14 DIAGNOSIS — Z89411 Acquired absence of right great toe: Secondary | ICD-10-CM

## 2016-09-14 DIAGNOSIS — E1122 Type 2 diabetes mellitus with diabetic chronic kidney disease: Secondary | ICD-10-CM

## 2016-09-14 DIAGNOSIS — K3184 Gastroparesis: Secondary | ICD-10-CM

## 2016-09-14 DIAGNOSIS — Z940 Kidney transplant status: Secondary | ICD-10-CM

## 2016-09-14 DIAGNOSIS — Z452 Encounter for adjustment and management of vascular access device: Secondary | ICD-10-CM

## 2016-09-14 DIAGNOSIS — G8929 Other chronic pain: Secondary | ICD-10-CM

## 2016-09-14 DIAGNOSIS — E1143 Type 2 diabetes mellitus with diabetic autonomic (poly)neuropathy: Secondary | ICD-10-CM

## 2016-09-14 DIAGNOSIS — I129 Hypertensive chronic kidney disease with stage 1 through stage 4 chronic kidney disease, or unspecified chronic kidney disease: Secondary | ICD-10-CM

## 2016-09-14 DIAGNOSIS — B951 Streptococcus, group B, as the cause of diseases classified elsewhere: Secondary | ICD-10-CM

## 2016-09-14 DIAGNOSIS — E1142 Type 2 diabetes mellitus with diabetic polyneuropathy: Secondary | ICD-10-CM

## 2016-09-14 DIAGNOSIS — E1151 Type 2 diabetes mellitus with diabetic peripheral angiopathy without gangrene: Secondary | ICD-10-CM

## 2016-09-14 DIAGNOSIS — R1310 Dysphagia, unspecified: Secondary | ICD-10-CM

## 2016-09-14 DIAGNOSIS — Z9181 History of falling: Secondary | ICD-10-CM

## 2016-09-14 DIAGNOSIS — B9629 Other Escherichia coli [E. coli] as the cause of diseases classified elsewhere: Secondary | ICD-10-CM

## 2016-09-14 DIAGNOSIS — Z792 Long term (current) use of antibiotics: Secondary | ICD-10-CM

## 2016-09-14 DIAGNOSIS — E11319 Type 2 diabetes mellitus with unspecified diabetic retinopathy without macular edema: Secondary | ICD-10-CM

## 2016-09-14 DIAGNOSIS — R7881 Bacteremia: Secondary | ICD-10-CM

## 2016-09-14 DIAGNOSIS — Z9483 Pancreas transplant status: Secondary | ICD-10-CM

## 2016-09-14 DIAGNOSIS — M25511 Pain in right shoulder: Secondary | ICD-10-CM

## 2016-09-14 DIAGNOSIS — K224 Dyskinesia of esophagus: Secondary | ICD-10-CM

## 2016-09-14 DIAGNOSIS — D631 Anemia in chronic kidney disease: Secondary | ICD-10-CM

## 2016-09-14 DIAGNOSIS — N3000 Acute cystitis without hematuria: Secondary | ICD-10-CM

## 2016-09-14 DIAGNOSIS — N183 Chronic kidney disease, stage 3 (moderate): Secondary | ICD-10-CM

## 2016-09-14 DIAGNOSIS — E11610 Type 2 diabetes mellitus with diabetic neuropathic arthropathy: Secondary | ICD-10-CM

## 2016-09-14 MED ORDER — FUROSEMIDE 20 MG TABLET
ORAL_TABLET | Freq: Every day | ORAL | 11 refills | 0 days | PRN
Start: 2016-09-14 — End: 2016-09-15

## 2016-09-14 NOTE — Unmapped (Signed)
Patient called and stated she didn't have any more Lasix.  She was told to take per discharge instructions.  Verified in discharge instructions that she is to take 20mg  Lasix PRN.  New Rx sent to patient's preferred pharmacy under Dr. Carlene Coria.

## 2016-09-15 MED ORDER — FUROSEMIDE 20 MG TABLET
ORAL_TABLET | Freq: Every day | ORAL | 11 refills | 0 days | Status: SS | PRN
Start: 2016-09-15 — End: 2017-03-03

## 2016-09-17 ENCOUNTER — Ambulatory Visit
Admission: RE | Admit: 2016-09-17 | Discharge: 2016-09-17 | Disposition: A | Discharge status: Home / Self Care | Payer: MEDICARE

## 2016-09-17 DIAGNOSIS — B9629 Other Escherichia coli [E. coli] as the cause of diseases classified elsewhere: Secondary | ICD-10-CM

## 2016-09-17 DIAGNOSIS — R7881 Bacteremia: Secondary | ICD-10-CM

## 2016-09-17 DIAGNOSIS — G8929 Other chronic pain: Secondary | ICD-10-CM

## 2016-09-17 DIAGNOSIS — K224 Dyskinesia of esophagus: Secondary | ICD-10-CM

## 2016-09-17 DIAGNOSIS — R1310 Dysphagia, unspecified: Secondary | ICD-10-CM

## 2016-09-17 DIAGNOSIS — B951 Streptococcus, group B, as the cause of diseases classified elsewhere: Secondary | ICD-10-CM

## 2016-09-17 DIAGNOSIS — D631 Anemia in chronic kidney disease: Secondary | ICD-10-CM

## 2016-09-17 DIAGNOSIS — I129 Hypertensive chronic kidney disease with stage 1 through stage 4 chronic kidney disease, or unspecified chronic kidney disease: Secondary | ICD-10-CM

## 2016-09-17 DIAGNOSIS — Z452 Encounter for adjustment and management of vascular access device: Secondary | ICD-10-CM

## 2016-09-17 DIAGNOSIS — E1122 Type 2 diabetes mellitus with diabetic chronic kidney disease: Secondary | ICD-10-CM

## 2016-09-17 DIAGNOSIS — E11319 Type 2 diabetes mellitus with unspecified diabetic retinopathy without macular edema: Secondary | ICD-10-CM

## 2016-09-17 DIAGNOSIS — N183 Chronic kidney disease, stage 3 (moderate): Secondary | ICD-10-CM

## 2016-09-17 DIAGNOSIS — K219 Gastro-esophageal reflux disease without esophagitis: Secondary | ICD-10-CM

## 2016-09-17 DIAGNOSIS — M25511 Pain in right shoulder: Secondary | ICD-10-CM

## 2016-09-17 DIAGNOSIS — E1142 Type 2 diabetes mellitus with diabetic polyneuropathy: Secondary | ICD-10-CM

## 2016-09-17 DIAGNOSIS — M869 Osteomyelitis, unspecified: Principal | ICD-10-CM

## 2016-09-17 DIAGNOSIS — T847XXA Infection and inflammatory reaction due to other internal orthopedic prosthetic devices, implants and grafts, initial encounter: Principal | ICD-10-CM

## 2016-09-17 DIAGNOSIS — E1151 Type 2 diabetes mellitus with diabetic peripheral angiopathy without gangrene: Secondary | ICD-10-CM

## 2016-09-17 DIAGNOSIS — Z940 Kidney transplant status: Secondary | ICD-10-CM

## 2016-09-17 DIAGNOSIS — E11610 Type 2 diabetes mellitus with diabetic neuropathic arthropathy: Secondary | ICD-10-CM

## 2016-09-17 DIAGNOSIS — K3184 Gastroparesis: Secondary | ICD-10-CM

## 2016-09-17 DIAGNOSIS — E1143 Type 2 diabetes mellitus with diabetic autonomic (poly)neuropathy: Secondary | ICD-10-CM

## 2016-09-17 DIAGNOSIS — Z89411 Acquired absence of right great toe: Secondary | ICD-10-CM

## 2016-09-17 DIAGNOSIS — Z9483 Pancreas transplant status: Secondary | ICD-10-CM

## 2016-09-17 DIAGNOSIS — Z792 Long term (current) use of antibiotics: Secondary | ICD-10-CM

## 2016-09-17 DIAGNOSIS — Z9181 History of falling: Secondary | ICD-10-CM

## 2016-09-17 DIAGNOSIS — N3000 Acute cystitis without hematuria: Secondary | ICD-10-CM

## 2016-09-17 LAB — ERYTHROCYTE SEDIMENTATION RATE: Lab: 107 — ABNORMAL HIGH

## 2016-09-17 LAB — CBC W/ AUTO DIFF
BASOPHILS ABSOLUTE COUNT: 0 10*9/L (ref 0.0–0.1)
EOSINOPHILS ABSOLUTE COUNT: 0.5 10*9/L — ABNORMAL HIGH (ref 0.0–0.4)
HEMATOCRIT: 25.4 % — ABNORMAL LOW (ref 36.0–46.0)
HEMOGLOBIN: 8 g/dL — ABNORMAL LOW (ref 13.5–16.0)
LARGE UNSTAINED CELLS: 2 % (ref 0–4)
LYMPHOCYTES ABSOLUTE COUNT: 1.5 10*9/L (ref 1.5–5.0)
MEAN CORPUSCULAR HEMOGLOBIN CONC: 31.6 g/dL (ref 31.0–37.0)
MEAN CORPUSCULAR HEMOGLOBIN: 30.1 pg (ref 26.0–34.0)
MEAN CORPUSCULAR VOLUME: 95.4 fL (ref 80.0–100.0)
MEAN PLATELET VOLUME: 6.9 fL — ABNORMAL LOW (ref 7.0–10.0)
NEUTROPHILS ABSOLUTE COUNT: 3.4 10*9/L (ref 2.0–7.5)
PLATELET COUNT: 299 10*9/L (ref 150–440)
RED BLOOD CELL COUNT: 2.67 10*12/L — ABNORMAL LOW (ref 4.00–5.20)
RED CELL DISTRIBUTION WIDTH: 15.7 % — ABNORMAL HIGH (ref 12.0–15.0)
WBC ADJUSTED: 6.1 10*9/L (ref 4.5–11.0)

## 2016-09-17 LAB — EGFR MDRD NON AF AMER
Glomerular filtration rate/1.73 sq M.predicted.non black:ArVRat:Pt:Ser/Plas/Bld:Qn:Creatinine-based formula (MDRD): 38 — ABNORMAL LOW

## 2016-09-17 LAB — VANCOMYCIN TROUGH: Vancomycin^trough:MCnc:Pt:Ser/Plas:Qn:: 13.7

## 2016-09-17 LAB — VARIABLE HEMOGLOBIN CONCENTRATION

## 2016-09-17 LAB — CREATININE: EGFR MDRD NON AF AMER: 38 mL/min/{1.73_m2} — ABNORMAL LOW (ref >=60)

## 2016-09-17 LAB — BLOOD UREA NITROGEN: Urea nitrogen:MCnc:Pt:Ser/Plas:Qn:: 14

## 2016-09-17 LAB — C-REACTIVE PROTEIN: C reactive protein:MCnc:Pt:Ser/Plas:Qn:: 56 — ABNORMAL HIGH

## 2016-09-20 ENCOUNTER — Ambulatory Visit: Admission: RE | Admit: 2016-09-20 | Discharge: 2016-09-20 | Payer: MEDICARE | Admitting: Orthopaedic Surgery

## 2016-09-20 DIAGNOSIS — T847XXD Infection and inflammatory reaction due to other internal orthopedic prosthetic devices, implants and grafts, subsequent encounter: Principal | ICD-10-CM

## 2016-09-20 MED ORDER — TRAMADOL 50 MG TABLET
ORAL_TABLET | Freq: Three times a day (TID) | ORAL | 0 refills | 0 days | Status: CP | PRN
Start: 2016-09-20 — End: 2016-11-29

## 2016-09-20 NOTE — Unmapped (Signed)
ORTHOPAEDIC POSTOPERATIVE CLINIC NOTE [phone])  Date: 09/20/2016  Attending: Brion Aliment, MD  [facility]    ASSESSMENT:  Rachel [name] is a 46 y.o. female with the following visit diagnoses:  S/P right infected TTC nail removal with chronic right foot ulcers on 7/9, doing well    PLAN:  - Continue IV antibiotics per the infectious disease team  - Partial WB RLE in boot  - Stitches removed      Scheduling Notes:  Return in about 4 weeks (around 10/18/2016).  - Xrays next visit: R ankle    SUBJECTIVE:  Chief Complaint: No chief complaint on file.    History of Present Illness:   Returns for her first postoperative follow-up. Continues to have some issues with pain but overall is feeling much better than when she was in the hospital. Does have some drainage from her wounds but states that it has been slowing down. Getting him IV antibiotics. No fevers       OBJECTIVE:  EXPANDED PHYSICAL EXAM (6 Point)  General Appearance well-nourished and no acute distress, Estimated body mass index is 38.19 kg/m?? as calculated from the following:    Height as of 08/26/16: 157.5 cm (5' 2).  ??   Weight as of 09/06/16: 94.7 kg (208 lb 12.8 oz).   Mood and Affect ?? alert, cooperative and pleasant   Cardiovascular ?? Poor perfusion distally chronically   Sensation ?? Sensation diminished chronically RLE   MUSCULOSKELETAL    RLE   ?? Incisions benign, no drainage this morning     Test Results  Imaging  No new films today.      No orders of the defined types were placed in this encounter.    Requested Prescriptions     Signed Prescriptions Disp Refills   ??? traMADol (ULTRAM) 50 mg tablet 40 tablet 0     Sig: Take 1 tablet (50 mg total) by mouth every eight (8) hours as needed for pain.        Note created by Forde Radon, September 20, 2016 8:16 AM

## 2016-09-20 NOTE — Unmapped (Signed)
-   OK to put some weight on operative leg  - OK to shower

## 2016-09-24 ENCOUNTER — Ambulatory Visit
Admission: RE | Admit: 2016-09-24 | Discharge: 2016-09-24 | Disposition: A | Discharge status: Home / Self Care | Payer: MEDICARE

## 2016-09-24 DIAGNOSIS — K219 Gastro-esophageal reflux disease without esophagitis: Secondary | ICD-10-CM

## 2016-09-24 DIAGNOSIS — E1151 Type 2 diabetes mellitus with diabetic peripheral angiopathy without gangrene: Secondary | ICD-10-CM

## 2016-09-24 DIAGNOSIS — M25511 Pain in right shoulder: Secondary | ICD-10-CM

## 2016-09-24 DIAGNOSIS — Z792 Long term (current) use of antibiotics: Secondary | ICD-10-CM

## 2016-09-24 DIAGNOSIS — E1122 Type 2 diabetes mellitus with diabetic chronic kidney disease: Secondary | ICD-10-CM

## 2016-09-24 DIAGNOSIS — N183 Chronic kidney disease, stage 3 (moderate): Secondary | ICD-10-CM

## 2016-09-24 DIAGNOSIS — R7881 Bacteremia: Secondary | ICD-10-CM

## 2016-09-24 DIAGNOSIS — Z452 Encounter for adjustment and management of vascular access device: Secondary | ICD-10-CM

## 2016-09-24 DIAGNOSIS — I129 Hypertensive chronic kidney disease with stage 1 through stage 4 chronic kidney disease, or unspecified chronic kidney disease: Secondary | ICD-10-CM

## 2016-09-24 DIAGNOSIS — N3000 Acute cystitis without hematuria: Secondary | ICD-10-CM

## 2016-09-24 DIAGNOSIS — B9629 Other Escherichia coli [E. coli] as the cause of diseases classified elsewhere: Secondary | ICD-10-CM

## 2016-09-24 DIAGNOSIS — E1142 Type 2 diabetes mellitus with diabetic polyneuropathy: Secondary | ICD-10-CM

## 2016-09-24 DIAGNOSIS — Z9181 History of falling: Secondary | ICD-10-CM

## 2016-09-24 DIAGNOSIS — Z9483 Pancreas transplant status: Secondary | ICD-10-CM

## 2016-09-24 DIAGNOSIS — E1143 Type 2 diabetes mellitus with diabetic autonomic (poly)neuropathy: Secondary | ICD-10-CM

## 2016-09-24 DIAGNOSIS — Z940 Kidney transplant status: Secondary | ICD-10-CM

## 2016-09-24 DIAGNOSIS — B951 Streptococcus, group B, as the cause of diseases classified elsewhere: Secondary | ICD-10-CM

## 2016-09-24 DIAGNOSIS — K224 Dyskinesia of esophagus: Secondary | ICD-10-CM

## 2016-09-24 DIAGNOSIS — T847XXA Infection and inflammatory reaction due to other internal orthopedic prosthetic devices, implants and grafts, initial encounter: Principal | ICD-10-CM

## 2016-09-24 DIAGNOSIS — R1310 Dysphagia, unspecified: Secondary | ICD-10-CM

## 2016-09-24 DIAGNOSIS — D631 Anemia in chronic kidney disease: Secondary | ICD-10-CM

## 2016-09-24 DIAGNOSIS — G8929 Other chronic pain: Secondary | ICD-10-CM

## 2016-09-24 DIAGNOSIS — E11319 Type 2 diabetes mellitus with unspecified diabetic retinopathy without macular edema: Secondary | ICD-10-CM

## 2016-09-24 DIAGNOSIS — Z89411 Acquired absence of right great toe: Secondary | ICD-10-CM

## 2016-09-24 DIAGNOSIS — K3184 Gastroparesis: Secondary | ICD-10-CM

## 2016-09-24 DIAGNOSIS — E11610 Type 2 diabetes mellitus with diabetic neuropathic arthropathy: Secondary | ICD-10-CM

## 2016-09-24 LAB — CBC W/ AUTO DIFF
BASOPHILS ABSOLUTE COUNT: 0 10*9/L (ref 0.0–0.1)
EOSINOPHILS ABSOLUTE COUNT: 0.3 10*9/L (ref 0.0–0.4)
HEMATOCRIT: 29.4 % — ABNORMAL LOW (ref 36.0–46.0)
HEMOGLOBIN: 9.1 g/dL — ABNORMAL LOW (ref 13.5–16.0)
LARGE UNSTAINED CELLS: 3 % (ref 0–4)
LYMPHOCYTES ABSOLUTE COUNT: 1.5 10*9/L (ref 1.5–5.0)
MEAN CORPUSCULAR HEMOGLOBIN CONC: 31 g/dL (ref 31.0–37.0)
MEAN CORPUSCULAR HEMOGLOBIN: 29.5 pg (ref 26.0–34.0)
MEAN CORPUSCULAR VOLUME: 95.2 fL (ref 80.0–100.0)
MEAN PLATELET VOLUME: 6.9 fL — ABNORMAL LOW (ref 7.0–10.0)
MONOCYTES ABSOLUTE COUNT: 0.4 10*9/L (ref 0.2–0.8)
NEUTROPHILS ABSOLUTE COUNT: 3.2 10*9/L (ref 2.0–7.5)
PLATELET COUNT: 293 10*9/L (ref 150–440)
RED CELL DISTRIBUTION WIDTH: 15.4 % — ABNORMAL HIGH (ref 12.0–15.0)

## 2016-09-24 LAB — CREATININE: CREATININE: 1.66 mg/dL — ABNORMAL HIGH (ref 0.60–1.00)

## 2016-09-24 LAB — SMEAR REVIEW

## 2016-09-24 LAB — BLOOD UREA NITROGEN: Urea nitrogen:MCnc:Pt:Ser/Plas:Qn:: 21

## 2016-09-24 LAB — EGFR MDRD AF AMER
Glomerular filtration rate/1.73 sq M.predicted.black:ArVRat:Pt:Ser/Plas/Bld:Qn:Creatinine-based formula (MDRD): 40 — ABNORMAL LOW

## 2016-09-24 LAB — VANCOMYCIN TROUGH: Vancomycin^trough:MCnc:Pt:Ser/Plas:Qn:: 15.8

## 2016-09-24 LAB — ERYTHROCYTE SEDIMENTATION RATE: Lab: 106 — ABNORMAL HIGH

## 2016-09-24 LAB — C-REACTIVE PROTEIN: C reactive protein:MCnc:Pt:Ser/Plas:Qn:: 48.4 — ABNORMAL HIGH

## 2016-09-24 LAB — MEAN CORPUSCULAR HEMOGLOBIN: Lab: 29.5

## 2016-09-25 LAB — RENAL FUNCTION PANEL
ANION GAP: 12 mmol/L (ref 9–15)
BLOOD UREA NITROGEN: 22 mg/dL — ABNORMAL HIGH (ref 7–21)
BUN / CREAT RATIO: 13
CALCIUM: 8.8 mg/dL (ref 8.5–10.2)
CHLORIDE: 115 mmol/L — ABNORMAL HIGH (ref 98–107)
CO2: 16 mmol/L — ABNORMAL LOW (ref 22.0–30.0)
CREATININE: 1.67 mg/dL — ABNORMAL HIGH (ref 0.60–1.00)
EGFR MDRD AF AMER: 40 mL/min/{1.73_m2} — ABNORMAL LOW (ref >=60)
EGFR MDRD NON AF AMER: 33 mL/min/{1.73_m2} — ABNORMAL LOW (ref >=60)
GLUCOSE RANDOM: 131 mg/dL (ref 65–179)
PHOSPHORUS: 4.4 mg/dL (ref 2.9–4.7)
POTASSIUM: 5.6 mmol/L — ABNORMAL HIGH (ref 3.5–5.0)
SODIUM: 143 mmol/L (ref 135–145)

## 2016-09-25 LAB — EGFR MDRD NON AF AMER
Glomerular filtration rate/1.73 sq M.predicted.non black:ArVRat:Pt:Ser/Plas/Bld:Qn:Creatinine-based formula (MDRD): 33 — ABNORMAL LOW

## 2016-09-26 LAB — TACROLIMUS, TROUGH: Lab: 5.2

## 2016-09-27 MED ORDER — SODIUM BICARBONATE 650 MG TABLET
ORAL_TABLET | Freq: Two times a day (BID) | ORAL | 11 refills | 0.00000 days | Status: CP
Start: 2016-09-27 — End: 2016-11-06

## 2016-09-28 DIAGNOSIS — K224 Dyskinesia of esophagus: Secondary | ICD-10-CM

## 2016-09-28 DIAGNOSIS — D631 Anemia in chronic kidney disease: Secondary | ICD-10-CM

## 2016-09-28 DIAGNOSIS — E11610 Type 2 diabetes mellitus with diabetic neuropathic arthropathy: Secondary | ICD-10-CM

## 2016-09-28 DIAGNOSIS — G8929 Other chronic pain: Secondary | ICD-10-CM

## 2016-09-28 DIAGNOSIS — Z452 Encounter for adjustment and management of vascular access device: Secondary | ICD-10-CM

## 2016-09-28 DIAGNOSIS — E1122 Type 2 diabetes mellitus with diabetic chronic kidney disease: Secondary | ICD-10-CM

## 2016-09-28 DIAGNOSIS — E1143 Type 2 diabetes mellitus with diabetic autonomic (poly)neuropathy: Secondary | ICD-10-CM

## 2016-09-28 DIAGNOSIS — K219 Gastro-esophageal reflux disease without esophagitis: Secondary | ICD-10-CM

## 2016-09-28 DIAGNOSIS — T847XXA Infection and inflammatory reaction due to other internal orthopedic prosthetic devices, implants and grafts, initial encounter: Principal | ICD-10-CM

## 2016-09-28 DIAGNOSIS — K3184 Gastroparesis: Secondary | ICD-10-CM

## 2016-09-28 DIAGNOSIS — R7881 Bacteremia: Secondary | ICD-10-CM

## 2016-09-28 DIAGNOSIS — N3000 Acute cystitis without hematuria: Secondary | ICD-10-CM

## 2016-09-28 DIAGNOSIS — I129 Hypertensive chronic kidney disease with stage 1 through stage 4 chronic kidney disease, or unspecified chronic kidney disease: Secondary | ICD-10-CM

## 2016-09-28 DIAGNOSIS — Z89411 Acquired absence of right great toe: Secondary | ICD-10-CM

## 2016-09-28 DIAGNOSIS — E11319 Type 2 diabetes mellitus with unspecified diabetic retinopathy without macular edema: Secondary | ICD-10-CM

## 2016-09-28 DIAGNOSIS — R1310 Dysphagia, unspecified: Secondary | ICD-10-CM

## 2016-09-28 DIAGNOSIS — N183 Chronic kidney disease, stage 3 (moderate): Secondary | ICD-10-CM

## 2016-09-28 DIAGNOSIS — B951 Streptococcus, group B, as the cause of diseases classified elsewhere: Secondary | ICD-10-CM

## 2016-09-28 DIAGNOSIS — Z9483 Pancreas transplant status: Secondary | ICD-10-CM

## 2016-09-28 DIAGNOSIS — Z940 Kidney transplant status: Secondary | ICD-10-CM

## 2016-09-28 DIAGNOSIS — E1151 Type 2 diabetes mellitus with diabetic peripheral angiopathy without gangrene: Secondary | ICD-10-CM

## 2016-09-28 DIAGNOSIS — E1142 Type 2 diabetes mellitus with diabetic polyneuropathy: Secondary | ICD-10-CM

## 2016-09-28 DIAGNOSIS — B9629 Other Escherichia coli [E. coli] as the cause of diseases classified elsewhere: Secondary | ICD-10-CM

## 2016-09-28 DIAGNOSIS — M25511 Pain in right shoulder: Secondary | ICD-10-CM

## 2016-09-28 DIAGNOSIS — Z9181 History of falling: Secondary | ICD-10-CM

## 2016-09-28 DIAGNOSIS — Z792 Long term (current) use of antibiotics: Secondary | ICD-10-CM

## 2016-10-01 ENCOUNTER — Ambulatory Visit
Admission: RE | Admit: 2016-10-01 | Discharge: 2016-10-01 | Disposition: A | Discharge status: Home / Self Care | Payer: MEDICARE

## 2016-10-01 DIAGNOSIS — T847XXA Infection and inflammatory reaction due to other internal orthopedic prosthetic devices, implants and grafts, initial encounter: Principal | ICD-10-CM

## 2016-10-01 DIAGNOSIS — R1310 Dysphagia, unspecified: Secondary | ICD-10-CM

## 2016-10-01 DIAGNOSIS — Z89411 Acquired absence of right great toe: Secondary | ICD-10-CM

## 2016-10-01 DIAGNOSIS — Z452 Encounter for adjustment and management of vascular access device: Secondary | ICD-10-CM

## 2016-10-01 DIAGNOSIS — E1122 Type 2 diabetes mellitus with diabetic chronic kidney disease: Secondary | ICD-10-CM

## 2016-10-01 DIAGNOSIS — B951 Streptococcus, group B, as the cause of diseases classified elsewhere: Secondary | ICD-10-CM

## 2016-10-01 DIAGNOSIS — I129 Hypertensive chronic kidney disease with stage 1 through stage 4 chronic kidney disease, or unspecified chronic kidney disease: Secondary | ICD-10-CM

## 2016-10-01 DIAGNOSIS — N183 Chronic kidney disease, stage 3 (moderate): Secondary | ICD-10-CM

## 2016-10-01 DIAGNOSIS — D631 Anemia in chronic kidney disease: Secondary | ICD-10-CM

## 2016-10-01 DIAGNOSIS — N3000 Acute cystitis without hematuria: Secondary | ICD-10-CM

## 2016-10-01 DIAGNOSIS — E11610 Type 2 diabetes mellitus with diabetic neuropathic arthropathy: Secondary | ICD-10-CM

## 2016-10-01 DIAGNOSIS — G8929 Other chronic pain: Secondary | ICD-10-CM

## 2016-10-01 DIAGNOSIS — IMO0001 Escherichia coli species not isolated: Secondary | ICD-10-CM

## 2016-10-01 DIAGNOSIS — Z9483 Pancreas transplant status: Secondary | ICD-10-CM

## 2016-10-01 DIAGNOSIS — B9629 Other Escherichia coli [E. coli] as the cause of diseases classified elsewhere: Secondary | ICD-10-CM

## 2016-10-01 DIAGNOSIS — K224 Dyskinesia of esophagus: Secondary | ICD-10-CM

## 2016-10-01 DIAGNOSIS — E11319 Type 2 diabetes mellitus with unspecified diabetic retinopathy without macular edema: Secondary | ICD-10-CM

## 2016-10-01 DIAGNOSIS — K219 Gastro-esophageal reflux disease without esophagitis: Secondary | ICD-10-CM

## 2016-10-01 DIAGNOSIS — E1143 Type 2 diabetes mellitus with diabetic autonomic (poly)neuropathy: Secondary | ICD-10-CM

## 2016-10-01 DIAGNOSIS — R7881 Bacteremia: Secondary | ICD-10-CM

## 2016-10-01 DIAGNOSIS — M25511 Pain in right shoulder: Secondary | ICD-10-CM

## 2016-10-01 DIAGNOSIS — Z940 Kidney transplant status: Secondary | ICD-10-CM

## 2016-10-01 DIAGNOSIS — Z9181 History of falling: Secondary | ICD-10-CM

## 2016-10-01 DIAGNOSIS — Z792 Long term (current) use of antibiotics: Secondary | ICD-10-CM

## 2016-10-01 DIAGNOSIS — K3184 Gastroparesis: Secondary | ICD-10-CM

## 2016-10-01 DIAGNOSIS — E1151 Type 2 diabetes mellitus with diabetic peripheral angiopathy without gangrene: Secondary | ICD-10-CM

## 2016-10-01 DIAGNOSIS — E1142 Type 2 diabetes mellitus with diabetic polyneuropathy: Secondary | ICD-10-CM

## 2016-10-01 LAB — CBC W/ AUTO DIFF
BASOPHILS ABSOLUTE COUNT: 0 10*9/L (ref 0.0–0.1)
HEMATOCRIT: 28.1 % — ABNORMAL LOW (ref 36.0–46.0)
HEMOGLOBIN: 8.9 g/dL — ABNORMAL LOW (ref 13.5–16.0)
LARGE UNSTAINED CELLS: 3 % (ref 0–4)
LYMPHOCYTES ABSOLUTE COUNT: 1.6 10*9/L (ref 1.5–5.0)
MEAN CORPUSCULAR HEMOGLOBIN: 29.7 pg (ref 26.0–34.0)
MEAN CORPUSCULAR VOLUME: 94.3 fL (ref 80.0–100.0)
MEAN PLATELET VOLUME: 6.9 fL — ABNORMAL LOW (ref 7.0–10.0)
MONOCYTES ABSOLUTE COUNT: 0.4 10*9/L (ref 0.2–0.8)
PLATELET COUNT: 232 10*9/L (ref 150–440)
RED BLOOD CELL COUNT: 2.98 10*12/L — ABNORMAL LOW (ref 4.00–5.20)
RED CELL DISTRIBUTION WIDTH: 15.8 % — ABNORMAL HIGH (ref 12.0–15.0)
WBC ADJUSTED: 5.5 10*9/L (ref 4.5–11.0)

## 2016-10-01 LAB — CREATININE
Creatinine:MCnc:Pt:Ser/Plas:Qn:: 1.54 — ABNORMAL HIGH
EGFR MDRD NON AF AMER: 36 mL/min/{1.73_m2} — ABNORMAL LOW (ref >=60)

## 2016-10-01 LAB — ERYTHROCYTE SEDIMENTATION RATE: Lab: 93 — ABNORMAL HIGH

## 2016-10-01 LAB — WBC ADJUSTED: Lab: 5.5

## 2016-10-01 LAB — VANCOMYCIN TROUGH: Vancomycin^trough:MCnc:Pt:Ser/Plas:Qn:: 15.4

## 2016-10-01 LAB — C-REACTIVE PROTEIN: C reactive protein:MCnc:Pt:Ser/Plas:Qn:: 21.7 — ABNORMAL HIGH

## 2016-10-01 LAB — BLOOD UREA NITROGEN: Urea nitrogen:MCnc:Pt:Ser/Plas:Qn:: 23 — ABNORMAL HIGH

## 2016-10-02 LAB — BASIC METABOLIC PANEL
ANION GAP: 9 mmol/L (ref 9–15)
BLOOD UREA NITROGEN: 23 mg/dL — ABNORMAL HIGH (ref 7–21)
BUN / CREAT RATIO: 15
CALCIUM: 9 mg/dL (ref 8.5–10.2)
CHLORIDE: 124 mmol/L — ABNORMAL HIGH (ref 98–107)
CO2: 13 mmol/L — ABNORMAL LOW (ref 22.0–30.0)
EGFR MDRD AF AMER: 45 mL/min/{1.73_m2} — ABNORMAL LOW (ref >=60)
EGFR MDRD NON AF AMER: 38 mL/min/{1.73_m2} — ABNORMAL LOW (ref >=60)
GLUCOSE RANDOM: 88 mg/dL (ref 65–179)
SODIUM: 146 mmol/L — ABNORMAL HIGH (ref 135–145)

## 2016-10-02 LAB — CALCIUM: Calcium:MCnc:Pt:Ser/Plas:Qn:: 9

## 2016-10-02 MED ORDER — OMEPRAZOLE 40 MG CAPSULE,DELAYED RELEASE
ORAL_CAPSULE | 3 refills | 0 days | Status: CP
Start: 2016-10-02 — End: 2017-07-11

## 2016-10-02 NOTE — Unmapped (Signed)
Patient request for RX refill.

## 2016-10-02 NOTE — Unmapped (Signed)
[facility] Refill and Clinical Coordination Note  Medication(s): PROGRAF 1 AND MYFORTIC 180    Rachel [name], DOB: [birth year]  Phone: [phone] (home) , Alternate phone contact: N/A  Shipping address: [address]  [place] [address]  Phone or address changes today?: No  All above HIPAA information verified.  Insurance changes? No    Completed refill and clinical call assessment today to schedule patient's medication shipment from the [facility] [phone]).      MEDICATION RECONCILIATION    Confirmed the medication and dosage are correct and have not changed: Yes, regimen is correct and unchanged.    Were there any changes to your medication(s) in the past month:  No, there are no changes reported at this time.    ADHERENCE    Is this medicine transplant or covered by Medicare Part B? Yes.    Prograf 1 mg   Quantity filled last month: 270   # of tablets left on hand: 40     Myfortic 180 mg   Quantity filled last month: 180   # of tablets left on hand: 40      Did you miss any doses in the past 4 weeks? No missed doses reported.  Adherence counseling provided? Not needed     SIDE EFFECT MANAGEMENT    Are you tolerating your medication?:  Rachel [name] reports tolerating the medication.  Side effect management discussed: None      Therapy is appropriate and should be continued.    Evidence of clinical benefit: See Epic note from 08/14/16      FINANCIAL/SHIPPING    Delivery Scheduled: Yes, Expected medication delivery date: 10/05/16   Additional medications refilled: No additional medications/refills needed at this time.    Rachel [name] did not have any additional questions at this time.    Delivery address validated in [facility] scheduling system: Yes, address listed above is correct.      We will follow up with patient monthly for standard refill processing and delivery.      Thank you,  Mickle Mallory   [facility] Specialty Pharmacist

## 2016-10-03 DIAGNOSIS — E1122 Type 2 diabetes mellitus with diabetic chronic kidney disease: Secondary | ICD-10-CM

## 2016-10-03 DIAGNOSIS — B9629 Other Escherichia coli [E. coli] as the cause of diseases classified elsewhere: Secondary | ICD-10-CM

## 2016-10-03 DIAGNOSIS — E11610 Type 2 diabetes mellitus with diabetic neuropathic arthropathy: Secondary | ICD-10-CM

## 2016-10-03 DIAGNOSIS — Z940 Kidney transplant status: Secondary | ICD-10-CM

## 2016-10-03 DIAGNOSIS — I129 Hypertensive chronic kidney disease with stage 1 through stage 4 chronic kidney disease, or unspecified chronic kidney disease: Secondary | ICD-10-CM

## 2016-10-03 DIAGNOSIS — R7881 Bacteremia: Secondary | ICD-10-CM

## 2016-10-03 DIAGNOSIS — Z9181 History of falling: Secondary | ICD-10-CM

## 2016-10-03 DIAGNOSIS — K224 Dyskinesia of esophagus: Secondary | ICD-10-CM

## 2016-10-03 DIAGNOSIS — N183 Chronic kidney disease, stage 3 (moderate): Secondary | ICD-10-CM

## 2016-10-03 DIAGNOSIS — Z452 Encounter for adjustment and management of vascular access device: Secondary | ICD-10-CM

## 2016-10-03 DIAGNOSIS — E11319 Type 2 diabetes mellitus with unspecified diabetic retinopathy without macular edema: Secondary | ICD-10-CM

## 2016-10-03 DIAGNOSIS — Z9483 Pancreas transplant status: Secondary | ICD-10-CM

## 2016-10-03 DIAGNOSIS — R1310 Dysphagia, unspecified: Secondary | ICD-10-CM

## 2016-10-03 DIAGNOSIS — Z89411 Acquired absence of right great toe: Secondary | ICD-10-CM

## 2016-10-03 DIAGNOSIS — E1142 Type 2 diabetes mellitus with diabetic polyneuropathy: Secondary | ICD-10-CM

## 2016-10-03 DIAGNOSIS — Z792 Long term (current) use of antibiotics: Secondary | ICD-10-CM

## 2016-10-03 DIAGNOSIS — T847XXA Infection and inflammatory reaction due to other internal orthopedic prosthetic devices, implants and grafts, initial encounter: Principal | ICD-10-CM

## 2016-10-03 DIAGNOSIS — K219 Gastro-esophageal reflux disease without esophagitis: Secondary | ICD-10-CM

## 2016-10-03 DIAGNOSIS — B951 Streptococcus, group B, as the cause of diseases classified elsewhere: Secondary | ICD-10-CM

## 2016-10-03 DIAGNOSIS — E1151 Type 2 diabetes mellitus with diabetic peripheral angiopathy without gangrene: Secondary | ICD-10-CM

## 2016-10-03 DIAGNOSIS — N3000 Acute cystitis without hematuria: Secondary | ICD-10-CM

## 2016-10-03 DIAGNOSIS — M25511 Pain in right shoulder: Secondary | ICD-10-CM

## 2016-10-03 DIAGNOSIS — K3184 Gastroparesis: Secondary | ICD-10-CM

## 2016-10-03 DIAGNOSIS — E1143 Type 2 diabetes mellitus with diabetic autonomic (poly)neuropathy: Secondary | ICD-10-CM

## 2016-10-03 DIAGNOSIS — G8929 Other chronic pain: Secondary | ICD-10-CM

## 2016-10-03 DIAGNOSIS — D631 Anemia in chronic kidney disease: Secondary | ICD-10-CM

## 2016-10-04 MED FILL — PROGRAF/1MG/CAP: PROGRAF/1MG/CAP | 30 days supply | Qty: 270 | Fill #2

## 2016-10-04 MED FILL — MYFORTIC/180MG/TAB: MYFORTIC/180MG/TAB | 30 days supply | Qty: 180 | Fill #2

## 2016-10-05 ENCOUNTER — Ambulatory Visit
Admission: RE | Admit: 2016-10-05 | Discharge: 2016-10-05 | Disposition: A | Discharge status: Home / Self Care | Payer: MEDICARE

## 2016-10-05 ENCOUNTER — Ambulatory Visit
Admission: RE | Admit: 2016-10-05 | Discharge: 2016-10-05 | Disposition: A | Discharge status: Home / Self Care | Payer: MEDICARE | Attending: Infectious Disease | Admitting: Infectious Disease

## 2016-10-05 DIAGNOSIS — M00871 Arthritis due to other bacteria, right ankle and foot: Principal | ICD-10-CM

## 2016-10-05 DIAGNOSIS — B370 Candidal stomatitis: Principal | ICD-10-CM

## 2016-10-05 MED ORDER — NYSTATIN 100,000 UNIT/ML ORAL SUSPENSION
Freq: Four times a day (QID) | ORAL | 0 refills | 0 days | Status: CP
Start: 2016-10-05 — End: 2016-11-06

## 2016-10-05 NOTE — Unmapped (Signed)
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    # Klebsiella UTI 08/23/16  - Urine culture grew Klebsiella pneumonia (S-CTX)  - Baseline Cr 1.5, increased to 2.5 on admission, now come down to 2.09    # Strep. agalactiae bacteremia 08/25/16 and right ankle/tibia infection complicated by hardware 08/29/16  - history of right ankle Charcot arthropathy  - blood cx (06/30 at OSH) grew strep agalactiae (previously isolated in the wound culture).   - MRI 7/1 w/o con shows inflammatory/infectious fluid collection and associated erosion along the lateral aspect of the tibial and hindfoot.hours  - 7/4 right foot ?swab culture MRSA + skin flora  - XR 7/5 w/o evidence of osteo  - s/p aspiration by Ortho 7/6 - cx NGTD (4+ PMNs, no orgs on Gram stain)  - s/p OR with 7/9 with removal of all hardware, grossly purulent in OR  - s/p vanc + ceftriaxone from 6/30-8/20    Recommendations:  Trial nystatin S&S  Trial probiotic yogurt  Right 3V ankle xray today  Continue vanc+ceftriaxone as planned until 8/20  Urogyn visit planned for recurrent UTI  Line out with IR 8/21    Follow up PRN    Subjective     Interval History:    Mouth feels like she ate something funny.   Intermittent diarrhea  No vaginitis symptoms  No problems with her line.  Stitches are out. Wounds healing well. No pain. No weight bearing.    Medications:  Antimicrobials: vanc and ceftriaxone  Prior/Current immunomodulators: tac (goal 5-8) and MMF doses 1620mg  bid are stable  Other medications reviewed.    Objective     Vital signs:  BP 163/78  - Pulse 81  - Temp 36.5 ??C (97.7 ??F) (Tympanic)  - Ht 157.5 cm (5' 2.01)  - Wt 71.7 kg (158 lb)  - BMI 28.89 kg/m??     Physical Exam:  GEN:  looks well, no apparent distress  EYES: sclerae anicteric and non injected and +glasses  ENT:no thrush, leukoplakia or oral lesions  CV:1+ peripheral edema LE b/l  PULM:normal work of breathing at rest  ZOX:WRUE, NTND and no tenderness over renal allograft  SKIN:no petechiae, ecchymoses or obvious rashes on clothed exam; see photos of foot right  MSK:no tenderness with palpation of ankle, minimal ROM  NEURO:no tremor noted, facial expression symmetric and moves extremities equally  PSYCH:attentive, appropriate affect, good eye contact, fluent speech                Labs:  Lab Results   Component Value Date    WBC 5.5 10/01/2016    WBC 5.6 09/24/2016    WBC 6.1 09/17/2016    WBC 5.8 11/02/2015    WBC 9.8 11/30/2014    WBC 6.4 04/28/2014    WBC 6.1 02/15/2014    WBC 8.5 12/01/2013    WBC 6.8 11/18/2013    WBC 5.9 07/13/2013    WBC 6.8 05/05/2013    HGB 8.9 (L) 10/01/2016    HGB 8.4 (L) 05/20/2014    HCT 28.1 (L) 10/01/2016    HCT 27.9 (L) 04/28/2014  PLT 232 10/01/2016    PLT 306 04/28/2014    NEUTROABS 2.9 10/01/2016    NEUTROABS 4.4 04/28/2014    LYMPHSABS 1.6 10/01/2016    LYMPHSABS 1.3 (L) 04/28/2014    EOSABS 0.3 10/01/2016    EOSABS 0.2 04/28/2014    NA 146 (H) 10/01/2016    NA 138 04/28/2014    K 5.5 (H) 10/01/2016    K 5.2 (H) 05/31/2014    K : 01/03/2007    K : 01/03/2007    BUN 23 (H) 10/01/2016    BUN 23 (H) 10/01/2016    BUN 33 (H) 11/02/2015    BUN : 01/03/2007    BUN : 01/03/2007    CREATININE 1.54 (H) 10/01/2016    CREATININE 1.49 (H) 10/01/2016    CREATININE 1.66 (H) 09/24/2016    CREATININE 1.67 (H) 09/24/2016    CREATININE 1.36 (H) 04/28/2014    CREATININE 1.37 (H) 03/17/2014    CREATININE 1.36 (H) 12/01/2013    CREATININE 1.4 (H) 07/17/2013    CREATININE : 01/03/2007    CREATININE : 01/03/2007    CREATININE 1.8 (H) 07/31/2005    GLU 88 10/01/2016    GLU 74 04/28/2014    MG 1.9 09/06/2016    MG 2.2 03/17/2014    ALBUMIN 3.1 (L) 09/24/2016    ALBUMIN 3.3 (L) 04/28/2014 BILITOT 0.9 08/25/2016    BILITOT 0.24 12/24/2014    AST 12 (L) 08/25/2016    AST 14 04/28/2014    ALT 21 08/25/2016    ALT 18 04/28/2014    ALKPHOS 89 08/25/2016    ALKPHOS 118 04/28/2014    INR 1.01 08/26/2016    INR 1.3 02/27/2013    ESR 93 (H) 10/01/2016    ESR 106 (H) 09/24/2016    ESR 34 (H) 09/15/2013    ESR 31 (H) 07/31/2013    CRP 21.7 (H) 10/01/2016    CRP 48.4 (H) 09/24/2016    CRP 0.7 09/14/2013    CRP 0.8 07/22/2013     Vanc tr      Microbiology:        Imaging:   Pending today

## 2016-10-08 ENCOUNTER — Ambulatory Visit
Admission: RE | Admit: 2016-10-08 | Discharge: 2016-10-08 | Disposition: A | Discharge status: Home / Self Care | Payer: MEDICARE

## 2016-10-08 DIAGNOSIS — E1151 Type 2 diabetes mellitus with diabetic peripheral angiopathy without gangrene: Secondary | ICD-10-CM

## 2016-10-08 DIAGNOSIS — K224 Dyskinesia of esophagus: Secondary | ICD-10-CM

## 2016-10-08 DIAGNOSIS — K219 Gastro-esophageal reflux disease without esophagitis: Secondary | ICD-10-CM

## 2016-10-08 DIAGNOSIS — N183 Chronic kidney disease, stage 3 (moderate): Secondary | ICD-10-CM

## 2016-10-08 DIAGNOSIS — R7881 Bacteremia: Secondary | ICD-10-CM

## 2016-10-08 DIAGNOSIS — Z9483 Pancreas transplant status: Secondary | ICD-10-CM

## 2016-10-08 DIAGNOSIS — M25511 Pain in right shoulder: Secondary | ICD-10-CM

## 2016-10-08 DIAGNOSIS — B9629 Other Escherichia coli [E. coli] as the cause of diseases classified elsewhere: Secondary | ICD-10-CM

## 2016-10-08 DIAGNOSIS — Z89411 Acquired absence of right great toe: Secondary | ICD-10-CM

## 2016-10-08 DIAGNOSIS — E11319 Type 2 diabetes mellitus with unspecified diabetic retinopathy without macular edema: Secondary | ICD-10-CM

## 2016-10-08 DIAGNOSIS — G8929 Other chronic pain: Secondary | ICD-10-CM

## 2016-10-08 DIAGNOSIS — I129 Hypertensive chronic kidney disease with stage 1 through stage 4 chronic kidney disease, or unspecified chronic kidney disease: Secondary | ICD-10-CM

## 2016-10-08 DIAGNOSIS — E1122 Type 2 diabetes mellitus with diabetic chronic kidney disease: Secondary | ICD-10-CM

## 2016-10-08 DIAGNOSIS — D631 Anemia in chronic kidney disease: Secondary | ICD-10-CM

## 2016-10-08 DIAGNOSIS — E11610 Type 2 diabetes mellitus with diabetic neuropathic arthropathy: Secondary | ICD-10-CM

## 2016-10-08 DIAGNOSIS — K3184 Gastroparesis: Secondary | ICD-10-CM

## 2016-10-08 DIAGNOSIS — Z452 Encounter for adjustment and management of vascular access device: Secondary | ICD-10-CM

## 2016-10-08 DIAGNOSIS — N3000 Acute cystitis without hematuria: Secondary | ICD-10-CM

## 2016-10-08 DIAGNOSIS — Z792 Long term (current) use of antibiotics: Secondary | ICD-10-CM

## 2016-10-08 DIAGNOSIS — B951 Streptococcus, group B, as the cause of diseases classified elsewhere: Secondary | ICD-10-CM

## 2016-10-08 DIAGNOSIS — E1142 Type 2 diabetes mellitus with diabetic polyneuropathy: Secondary | ICD-10-CM

## 2016-10-08 DIAGNOSIS — R1310 Dysphagia, unspecified: Secondary | ICD-10-CM

## 2016-10-08 DIAGNOSIS — Z940 Kidney transplant status: Secondary | ICD-10-CM

## 2016-10-08 DIAGNOSIS — T847XXA Infection and inflammatory reaction due to other internal orthopedic prosthetic devices, implants and grafts, initial encounter: Principal | ICD-10-CM

## 2016-10-08 DIAGNOSIS — Z9181 History of falling: Secondary | ICD-10-CM

## 2016-10-08 DIAGNOSIS — E1143 Type 2 diabetes mellitus with diabetic autonomic (poly)neuropathy: Secondary | ICD-10-CM

## 2016-10-08 LAB — BASIC METABOLIC PANEL
ANION GAP: 10 mmol/L (ref 9–15)
BLOOD UREA NITROGEN: 25 mg/dL — ABNORMAL HIGH (ref 7–21)
BUN / CREAT RATIO: 17
CALCIUM: 8.8 mg/dL (ref 8.5–10.2)
CHLORIDE: 119 mmol/L — ABNORMAL HIGH (ref 98–107)
CO2: 15 mmol/L — ABNORMAL LOW (ref 22.0–30.0)
CREATININE: 1.46 mg/dL — ABNORMAL HIGH (ref 0.60–1.00)
EGFR MDRD NON AF AMER: 38 mL/min/{1.73_m2} — ABNORMAL LOW (ref >=60)
GLUCOSE RANDOM: 89 mg/dL (ref 65–179)
POTASSIUM: 5.8 mmol/L — ABNORMAL HIGH (ref 3.5–5.0)
SODIUM: 144 mmol/L (ref 135–145)

## 2016-10-08 LAB — CBC W/ AUTO DIFF
BASOPHILS ABSOLUTE COUNT: 0 10*9/L (ref 0.0–0.1)
EOSINOPHILS ABSOLUTE COUNT: 0.5 10*9/L — ABNORMAL HIGH (ref 0.0–0.4)
HEMATOCRIT: 27.7 % — ABNORMAL LOW (ref 36.0–46.0)
HEMOGLOBIN: 8.9 g/dL — ABNORMAL LOW (ref 13.5–16.0)
LARGE UNSTAINED CELLS: 2 % (ref 0–4)
LYMPHOCYTES ABSOLUTE COUNT: 1.9 10*9/L (ref 1.5–5.0)
MEAN CORPUSCULAR HEMOGLOBIN CONC: 32.3 g/dL (ref 31.0–37.0)
MEAN CORPUSCULAR VOLUME: 92.9 fL (ref 80.0–100.0)
MEAN PLATELET VOLUME: 7.7 fL (ref 7.0–10.0)
MONOCYTES ABSOLUTE COUNT: 0.5 10*9/L (ref 0.2–0.8)
NEUTROPHILS ABSOLUTE COUNT: 2.9 10*9/L (ref 2.0–7.5)
PLATELET COUNT: 254 10*9/L (ref 150–440)
RED BLOOD CELL COUNT: 2.98 10*12/L — ABNORMAL LOW (ref 4.00–5.20)
RED CELL DISTRIBUTION WIDTH: 16.5 % — ABNORMAL HIGH (ref 12.0–15.0)
WBC ADJUSTED: 5.9 10*9/L (ref 4.5–11.0)

## 2016-10-08 LAB — BASOPHILS ABSOLUTE COUNT: Lab: 0

## 2016-10-08 LAB — MAGNESIUM: Magnesium:MCnc:Pt:Ser/Plas:Qn:: 2

## 2016-10-08 LAB — VANCOMYCIN TROUGH: Vancomycin^trough:MCnc:Pt:Ser/Plas:Qn:: 16.1

## 2016-10-08 LAB — CHLORIDE: Chloride:SCnc:Pt:Ser/Plas:Qn:: 119 — ABNORMAL HIGH

## 2016-10-08 LAB — PHOSPHORUS
PHOSPHORUS: 4.1 mg/dL (ref 2.9–4.7)
Phosphate:MCnc:Pt:Ser/Plas:Qn:: 4.1

## 2016-10-09 ENCOUNTER — Ambulatory Visit: Admission: RE | Admit: 2016-10-09 | Discharge: 2016-10-09 | Payer: MEDICARE | Admitting: Orthopaedic Surgery

## 2016-10-09 DIAGNOSIS — M25511 Pain in right shoulder: Secondary | ICD-10-CM

## 2016-10-09 DIAGNOSIS — K219 Gastro-esophageal reflux disease without esophagitis: Secondary | ICD-10-CM

## 2016-10-09 DIAGNOSIS — D631 Anemia in chronic kidney disease: Secondary | ICD-10-CM

## 2016-10-09 DIAGNOSIS — Z940 Kidney transplant status: Secondary | ICD-10-CM

## 2016-10-09 DIAGNOSIS — Z452 Encounter for adjustment and management of vascular access device: Secondary | ICD-10-CM

## 2016-10-09 DIAGNOSIS — E11319 Type 2 diabetes mellitus with unspecified diabetic retinopathy without macular edema: Secondary | ICD-10-CM

## 2016-10-09 DIAGNOSIS — E1122 Type 2 diabetes mellitus with diabetic chronic kidney disease: Secondary | ICD-10-CM

## 2016-10-09 DIAGNOSIS — R1310 Dysphagia, unspecified: Secondary | ICD-10-CM

## 2016-10-09 DIAGNOSIS — N183 Chronic kidney disease, stage 3 (moderate): Secondary | ICD-10-CM

## 2016-10-09 DIAGNOSIS — K224 Dyskinesia of esophagus: Secondary | ICD-10-CM

## 2016-10-09 DIAGNOSIS — R7881 Bacteremia: Secondary | ICD-10-CM

## 2016-10-09 DIAGNOSIS — M86671 Other chronic osteomyelitis, right ankle and foot: Secondary | ICD-10-CM

## 2016-10-09 DIAGNOSIS — K3184 Gastroparesis: Secondary | ICD-10-CM

## 2016-10-09 DIAGNOSIS — T847XXA Infection and inflammatory reaction due to other internal orthopedic prosthetic devices, implants and grafts, initial encounter: Principal | ICD-10-CM

## 2016-10-09 DIAGNOSIS — Z792 Long term (current) use of antibiotics: Secondary | ICD-10-CM

## 2016-10-09 DIAGNOSIS — T847XXD Infection and inflammatory reaction due to other internal orthopedic prosthetic devices, implants and grafts, subsequent encounter: Principal | ICD-10-CM

## 2016-10-09 DIAGNOSIS — E11610 Type 2 diabetes mellitus with diabetic neuropathic arthropathy: Secondary | ICD-10-CM

## 2016-10-09 DIAGNOSIS — E1142 Type 2 diabetes mellitus with diabetic polyneuropathy: Secondary | ICD-10-CM

## 2016-10-09 DIAGNOSIS — I129 Hypertensive chronic kidney disease with stage 1 through stage 4 chronic kidney disease, or unspecified chronic kidney disease: Secondary | ICD-10-CM

## 2016-10-09 DIAGNOSIS — Z9483 Pancreas transplant status: Secondary | ICD-10-CM

## 2016-10-09 DIAGNOSIS — E1151 Type 2 diabetes mellitus with diabetic peripheral angiopathy without gangrene: Secondary | ICD-10-CM

## 2016-10-09 DIAGNOSIS — N3000 Acute cystitis without hematuria: Secondary | ICD-10-CM

## 2016-10-09 DIAGNOSIS — Z89411 Acquired absence of right great toe: Secondary | ICD-10-CM

## 2016-10-09 DIAGNOSIS — E1143 Type 2 diabetes mellitus with diabetic autonomic (poly)neuropathy: Secondary | ICD-10-CM

## 2016-10-09 DIAGNOSIS — B9629 Other Escherichia coli [E. coli] as the cause of diseases classified elsewhere: Secondary | ICD-10-CM

## 2016-10-09 DIAGNOSIS — G8929 Other chronic pain: Secondary | ICD-10-CM

## 2016-10-09 DIAGNOSIS — Z9181 History of falling: Secondary | ICD-10-CM

## 2016-10-09 DIAGNOSIS — B951 Streptococcus, group B, as the cause of diseases classified elsewhere: Secondary | ICD-10-CM

## 2016-10-09 LAB — TACROLIMUS, TROUGH: Lab: 4.9

## 2016-10-09 NOTE — Unmapped (Signed)
ORTHOPAEDIC POSTOPERATIVE CLINIC NOTE [phone])  Date: 10/09/2016  Attending: Brion Aliment, MD  [facility]    ASSESSMENT:  Rachel [name] is a 46 y.o. female with the following visit diagnoses:  S/P right infected TTC nail removal with chronic right foot ulcers on 7/9, doing well    PLAN:  - Continue IV antibiotics per the infectious disease team, planned to end next week  - WBAT in hard sole shoe  - Custom diabetic shoes ordered today    DME Documentation: Lower Extremity - Ambulatory AFOs  The patient was prescribed a prefabricated post-op/hard-soled shoe.  The primary encounter diagnosis was Infected hardware in right lower extremity, subsequent encounter. 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  The patient is ambulatory but has weakness and/or instability of her right lower extremity which requires stabilization from the semi-rigid/rigid orthosis to potentially improve their function.      Scheduling Notes:  Return in about 4 weeks (around 11/06/2016).  - Xrays next visit: none    SUBJECTIVE:  Chief Complaint: Wound Check    History of Present Illness:   Returns for 6 weeks postoperative visit. She reports she's been doing well. Pain is well-controlled. Plan is to stop IV antibiotics next week. No fevers. No drainage from her wounds.  She has been compliant with the boot and essentially just touchdown weightbearing for showers  Pain Assessment: No/denies pain    OBJECTIVE:  EXPANDED PHYSICAL EXAM (6 Point)  General Appearance well-nourished and no acute distress, Estimated body mass index is 28.89 kg/m?? as calculated from the following:    Height as of 10/05/16: 157.5 cm (5' 2.01).  ??   Weight as of 10/05/16: 71.7 kg (158 lb).   Mood and Affect ?? alert, cooperative and pleasant   Cardiovascular ?? Poor perfusion distally chronically   Sensation ?? Sensation diminished chronically RLE MUSCULOSKELETAL    RLE   ?? Incisions benign, no drainage this morning  ?? Wiggles toes     Test Results  Imaging  Recent plain films of the right ankle were reviewed today.  No fracture or dislocation.  .      No orders of the defined types were placed in this encounter.    Requested Prescriptions      No prescriptions requested or ordered in this encounter        Note created by Forde Radon, September 20, 2016 8:16 AM

## 2016-10-15 NOTE — Unmapped (Signed)
-----   Message from Ambrose Pancoast, RN sent at 10/15/2016  2:25 PM EDT -----  Regarding: RE: nurse visit  The 930 appt is for the line removal, she will come before as she wants the labs drawn before the line is removed.  Thanks!  ----- Message -----  From: Dorena Cookey  Sent: 10/15/2016   1:35 PM  To: Ambrose Pancoast, RN, #  Subject: RE: nurse visit                                  Scheduled for 12:30, she has another appt at 9:30 am    Thanks!    Britt Boozer  ----- Message -----  From: Ambrose Pancoast, RN  Sent: 10/15/2016   1:21 PM  To: Dorena Cookey, Karleen Hampshire, LPN, #  Subject: nurse visit                                      She needs a nurse visit for 8/21 for labs to be drawn from her line.  The orders are in for a clinic collect.    Thanks [employer]

## 2016-10-15 NOTE — Unmapped (Signed)
Patient coming 10/16/2016 in the AM.

## 2016-10-16 ENCOUNTER — Ambulatory Visit
Admission: RE | Admit: 2016-10-16 | Discharge: 2016-10-16 | Disposition: A | Discharge status: Home / Self Care | Payer: MEDICARE

## 2016-10-16 DIAGNOSIS — Z79899 Other long term (current) drug therapy: Secondary | ICD-10-CM

## 2016-10-16 DIAGNOSIS — M00871 Arthritis due to other bacteria, right ankle and foot: Principal | ICD-10-CM

## 2016-10-16 DIAGNOSIS — Z940 Kidney transplant status: Principal | ICD-10-CM

## 2016-10-16 LAB — CBC W/ AUTO DIFF
BASOPHILS ABSOLUTE COUNT: 0 10*9/L (ref 0.0–0.1)
EOSINOPHILS ABSOLUTE COUNT: 0.5 10*9/L — ABNORMAL HIGH (ref 0.0–0.4)
HEMATOCRIT: 25.9 % — ABNORMAL LOW (ref 36.0–46.0)
HEMOGLOBIN: 8 g/dL — ABNORMAL LOW (ref 12.0–16.0)
LARGE UNSTAINED CELLS: 1 % (ref 0–4)
LYMPHOCYTES ABSOLUTE COUNT: 1.5 10*9/L (ref 1.5–5.0)
MEAN CORPUSCULAR HEMOGLOBIN CONC: 30.9 g/dL — ABNORMAL LOW (ref 31.0–37.0)
MEAN CORPUSCULAR HEMOGLOBIN: 28.5 pg (ref 26.0–34.0)
MEAN CORPUSCULAR VOLUME: 92.3 fL (ref 80.0–100.0)
MEAN PLATELET VOLUME: 7.6 fL (ref 7.0–10.0)
MONOCYTES ABSOLUTE COUNT: 0.6 10*9/L (ref 0.2–0.8)
NEUTROPHILS ABSOLUTE COUNT: 5.3 10*9/L (ref 2.0–7.5)
PLATELET COUNT: 261 10*9/L (ref 150–440)
RED BLOOD CELL COUNT: 2.81 10*12/L — ABNORMAL LOW (ref 4.00–5.20)
RED CELL DISTRIBUTION WIDTH: 16.5 % — ABNORMAL HIGH (ref 12.0–15.0)

## 2016-10-16 LAB — RENAL FUNCTION PANEL
ALBUMIN: 3.2 g/dL — ABNORMAL LOW (ref 3.5–5.0)
ANION GAP: 12 mmol/L (ref 9–15)
BLOOD UREA NITROGEN: 27 mg/dL — ABNORMAL HIGH (ref 7–21)
CALCIUM: 8.4 mg/dL — ABNORMAL LOW (ref 8.5–10.2)
CHLORIDE: 111 mmol/L — ABNORMAL HIGH (ref 98–107)
CO2: 13 mmol/L — ABNORMAL LOW (ref 22.0–30.0)
CREATININE: 1.7 mg/dL — ABNORMAL HIGH (ref 0.60–1.00)
EGFR MDRD NON AF AMER: 32 mL/min/{1.73_m2} — ABNORMAL LOW (ref >=60)
GLUCOSE RANDOM: 70 mg/dL (ref 65–179)
PHOSPHORUS: 4.2 mg/dL (ref 2.9–4.7)
POTASSIUM: 4.6 mmol/L (ref 3.5–5.0)
SODIUM: 136 mmol/L (ref 135–145)

## 2016-10-16 LAB — SMEAR REVIEW

## 2016-10-16 LAB — TACROLIMUS, TROUGH: Lab: 11.7

## 2016-10-16 LAB — MAGNESIUM: Magnesium:MCnc:Pt:Ser/Plas:Qn:: 2

## 2016-10-16 LAB — BLOOD UREA NITROGEN: Urea nitrogen:MCnc:Pt:Ser/Plas:Qn:: 27 — ABNORMAL HIGH

## 2016-10-16 LAB — LYMPHOCYTES ABSOLUTE COUNT: Lab: 1.5

## 2016-10-16 NOTE — Unmapped (Signed)
Labs drawn from line per protocol. Pt tolerated without difficulty. Drsg is clean dry and intact.

## 2016-10-16 NOTE — Unmapped (Signed)
VIR BRIEF PROCEDURE NOTE      Patient: Rachel [name]            MRN: [id number]    October 16, 2016 10:50 AM     Procedure: right femoral line removal    Post Procedure Diagnosis: finished with antibiotics    Attending Physician: Dr. Claretta Fraise    Assistant(s): Dr. Casandra Doffing    Findings: Successful removal of right femoral powerline. Sterile dressing placed.     Samples Obtained: None    Blood Loss: 0 ml        Casandra Doffing, MD    October 16, 2016 10:50 AM

## 2016-10-16 NOTE — Unmapped (Signed)
After your line removal/port removal:  Avoid strenuous activity and heavy lifting (>10lbs) for 1 week.  Do not submerge site until healed. No tub baths, swimming pools until healed.    Dressing stays intact for 2 days.  After two days, remove dressing and gently wash site.   Dry and apply new clean dressing.   Wash site and apply new dressing daily until healed.  If steri-strips are present, let them fall off on their own.    You may return to your usual diet unless otherwise directed.   Please call [facility] VIR clinic at [phone] and choose option 2 to speak to a nurse for any problems or questions following discharge including:    Bleeding that saturates bandage  Pain at site not controlled by regular pain medicines  Signs of infection at site: redness, swelling, pus draining, fever >101.5    If site bleeds, sit upright and hold pressure to site.   For rapid, uncontrolled bleeding hold firm pressure and seek emergency medical attention.     Calls received during normal business hours (Monday through Friday from 8:00 am to 4:30 pm) will be returned within one business day. Calls received after normal business hours OR on weekends will be returned during the next business day.     If this is an after-hours urgent matter, you can call the Hospital front desk at [phone] and ask to speak with the Radiology doctor on call.    If you are experiencing a medical emergency, please call 911 or go to your nearest emergency room.

## 2016-10-24 NOTE — Unmapped (Signed)
BB

## 2016-10-24 NOTE — Unmapped (Signed)
[facility] Refill Coordination Note  Specialty Medication(s): PROGRAF 1 AND MYFORTIC 180  Additional Medications shipped:     Rachel [name], DOB: [birth year]  Phone: [phone] (home) , Alternate phone contact: N/A  Phone or address changes today?: No  All above HIPAA information was verified with patient.  Shipping Address: [address]  [place] [address]   Insurance changes? No    Completed refill call assessment today to schedule patient's medication shipment from the [facility] [phone]).      Confirmed the medication and dosage are correct and have not changed: Yes, regimen is correct and unchanged.    Confirmed patient started or stopped the following medications in the past month:  No, there are no changes reported at this time.    Are you tolerating your medication?:  Rachel [name] reports tolerating the medication.    ADHERENCE    Prograf 1 mg   Quantity filled last month: 270   # of tablets left on hand: 90      Myfortic 180 mg   Quantity filled last month: 180   # of tablets left on hand: 60      Did you miss any doses in the past 4 weeks? No missed doses reported.    FINANCIAL/SHIPPING    Delivery Scheduled: Yes, Expected medication delivery date: 11/01/16     Rachel [name] did not have any additional questions at this time.    Delivery address validated in [facility] scheduling system: YES    We will follow up with patient monthly for standard refill processing and delivery.      Thank you,  Rachel [name]   [facility] Specialty Pharmacist

## 2016-10-31 MED FILL — MYFORTIC/180MG/TAB: MYFORTIC/180MG/TAB | 30 days supply | Qty: 180 | Fill #3

## 2016-10-31 MED FILL — PROGRAF/1MG/CAP: PROGRAF/1MG/CAP | 30 days supply | Qty: 270 | Fill #3

## 2016-11-05 NOTE — Unmapped (Signed)
Transplant Nephrology Clinic Visit    History of Present Illness    Patient is a 46 y.o. 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 She previously had a living related renal transplant in 1998, which failed in 2008. She presents today for a routine follow up. Her post transplant course with this transplant has been without rejection or recurrent disease. Patient does not have evidence of donor specific antibodies as of 08/02/2015. Her previous baseline creatinine had been around 1.0-1.2, then between 1.2 and 1.5 after her right foot infection. However, her creatinine was noted to be 1.72 at her last visit on 6/6 and was 1.64 on 11/02/2015. I don't have any other laboratory values during that time.    She had been in a boot since January 2016 for her nonhealing right foot wound. She continued with wound care without success. Ultimately she went to see Dr. Neill Loft at [facility] who performed a flap surgery on 09/17/2014. She required another surgery on 10/24/2014 involving pins. She was in a halo cast for a period of time. She required a knee scooter for over 6 months. Remarkably, the flap is healed and she is now in normal shoes.    Interval history since last visit on 06/09/15    At her previous visit, she was found to have a urinary tract infection with Escherichia coli that was only sensitive to oral Macrobid, all other antibiotics were IV. We initially gave the Macrobid, but unfortunately her symptoms worsened. For that reason, she was admitted from 4/19 through 06/15/2016 for placement of tunneled catheter for IV antibiotics. She was treated with 7 days of ertapenem as an outpatient following that. During that hospitalization, wound service and vascular surgery were consulted for her right lower extremity wound. They recommended no surgical intervention at that time and gave dressing supplies and instruction. Patient completed course of IV antibiotics, and power line was removed on 07/04/2016.    She was seen by vascular surgery on 07/05/16 who felt that the plantar aspect of her right foot was healed and felt she could return on an as-needed basis.    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 They recommended surveillance to which she agreed.    She was seen at family medicine on 08/14/2016 for sinusitis and treated with azithromycin 500 mg for 3 days.    She was again admitted to [facility] from 6/30 through 09/10/2016. She presented with fever, urinary frequency and dysuria. She had increased edema of the right foot for 3-4 days prior to admission. PVLs were negative for DVT and right foot x-ray showed degenerative/Charcot changes with no definitive osteomyelitis. One of 2 blood cultures from the outside hospital grew strep agalactiae. MRI of the right foot showed a fluid collection with underlying erosion concerning for an inflammatory/infectious fluid collection. She was seen by vascular surgery for the plantar wound who recommended outpatient visit to wound care clinic. She was then evaluated by orthopedic surgery for her right ankle edema and erythema. Right BKA was recommended, which she refused. She was taken to the operating room for hardware exchange and placement of antibiotic beads. Immunocompromised ID service recommended vancomycin 750 mg daily and ceftriaxone 2 g daily for a 6 week total course ending 10/15/2016. Her urine culture from the outside hospital grew Klebsiella which was felt to be adequately covered by her IV antibiotics. Her  creatinine on admission was 2.5 from her most recent baseline of 1.6-1.7. Was down to 1.65 on discharge.    She was seen by immunocompromised infectious disease on 10/05/16 who recommended continuation of IV antibiotics through 8/20 as well as urogynecology visit for recurrent UTIs. Her tunnel catheter was removed on 10/16/2016.    Orthopedic surgery has seen her in follow-up and recommended weightbearing as tolerated in a hard shoe and ordered custom diabetic shoes. She continues to have right lower extremity edema that is better at the end of the day, but worsens when she is upright. She is scheduled for a return visit with them later today.    She has been doing fairly well since her last visit. She has returned to school. Her right foot continues to improve, though still causes her quite a bit of pain. The tramadol is not helping much. She does note some vaginal discharge and is wondering about empiric therapy for a vaginal yeast infection.    Review of Systems    Otherwise on review of systems patient denies fever or chills, chest pain, SOB, PND or orthopnea. Denies N/V/abdominal pain. No dysuria, hematuria or difficulty voiding. Bowel movements normal. Denies rash. All other systems are reviewed and are negative.    Medications    Current Outpatient Prescriptions   Medication Sig Dispense Refill   ??? albuterol (PROVENTIL HFA;VENTOLIN HFA) 90 mcg/actuation inhaler Inhale 2 puffs every six (6) hours as needed for wheezing or shortness of breath. 1 Inhaler 0   ??? amitriptyline (ELAVIL) 25 MG tablet Take 1 tablet (25 mg total) by mouth three (3) times a day (at 6am, noon and 6pm). 90 tablet 3   ??? aspirin (ADULT LOW DOSE ASPIRIN) 81 MG tablet Take 81 mg by mouth daily.      ??? atorvastatin (LIPITOR) 40 MG tablet TAKE ONE TABLET BY MOUTH DAILY 30 tablet 11   ??? calcium-vitamin D (CALCIUM-VITAMIN D) 500 mg(1,250mg ) -200 unit per tablet Take 500 mg by mouth daily.      ??? cyclobenzaprine (FLEXERIL) 5 MG tablet Take 1-2 tablets (5-10 mg total) by mouth Three (3) times a day as needed for muscle spasms. 60 tablet 0   ??? DUREZOL 0.05 % Drop daily as needed.      ??? fluconazole (DIFLUCAN) 150 MG tablet Take 1 tablet (150 mg total) by mouth once. for 1 dose 1 tablet 0   ??? furosemide (LASIX) 20 MG tablet Take 1 tablet (20 mg total) by mouth daily as needed. 30 tablet 11   ??? leg brace (KNEE BRACE) Misc 1 Device by Miscellaneous route daily. Left knee hinged knee brace 1 each 0   ??? multivitamin (PHARMACIST FAVORITE MULTI-VIT) per tablet Take by mouth. Frequency:QD   Dosage:1   TABLET  Instructions:  Note:Dose: 1 TABLET     ??? mycophenolate (MYFORTIC) 180 MG EC tablet Take 3 tablets (540 mg total) by mouth Two (2) times a day. Z94.0, DAW1, brand medically necessary 180 tablet 5   ??? omeprazole (PRILOSEC) 40 MG capsule TAKE ONE CAPSULE BY MOUTH TWICE A DAY 180 capsule 3   ??? polyethylene glycol (MIRALAX) 17 gram/dose powder Take 17 g by mouth daily. (Patient taking differently: Take 17 g by mouth daily as needed. ) 255 g 3   ??? PROGRAF 1 mg capsule 4mg  in the am and 3mg  in the pm, DAW1, brand medically necessary, tx date 01/31/09, Z94.0, [facility] Transfer 270 capsule 5   ??? promethazine (PHENERGAN) 25 mg/mL injection One mL  Every 8 hours as needed for nausea 1 mL 6   ??? sodium bicarbonate 650 mg tablet Take 1 tablet (648 mg total) by mouth Two (2) times a day. 60 tablet 11   ??? traMADol (ULTRAM) 50 mg tablet Take 1 tablet (50 mg total) by mouth every eight (8) hours as needed for pain. 40 tablet 0   ??? VIGAMOX 0.5 % ophthalmic solution daily as needed.        No current facility-administered medications for this visit.        Physical Exam    BP 130/68 (BP Site: R Arm, BP Position: Sitting, BP Cuff Size: Medium)  - Pulse 64  - Temp 36.4 ??C (97.6 ??F) (Tympanic)  - Ht 157.5 cm (5' 2.01)  - Wt 71.1 kg (156 lb 12.8 oz)  - BMI 28.67 kg/m??   General: Patient is a pleasant female in no apparent distress.  Eyes: Sclera anicteric.  ENT: Oropharynx without lesions.   Neck: Supple without LAD/JVD/bruits.  Lungs: Clear to auscultation bilaterally, no wheezes/rales/rhonchi.  Cardiovascular: Regular rate and rhythm without murmurs, rubs or gallops.  Abdomen: Soft, notender/nondistended. Positive bowel sounds. No tenderness over the graft  Extremities: No edema on the left. 1-2+ nonpitting edema on the right. Her right foot is in a hard soled shoe.  Skin: Without rash.  Neurological: Grossly nonfocal.  Psychiatric: Mood and affect appropriate.    Laboratory Results    Recent Results (from the past 170 hour(s))   Phosphorus Level    Collection Time: 11/06/16  7:40 AM   Result Value Ref Range    Phosphorus 3.9 2.9 - 4.7 mg/dL   Magnesium Level    Collection Time: 11/06/16  7:40 AM   Result Value Ref Range    Magnesium 2.2 1.6 - 2.2 mg/dL   Comprehensive Metabolic Panel    Collection Time: 11/06/16  7:40 AM   Result Value Ref Range    Sodium 139 135 - 145 mmol/L    Potassium 5.2 (H) 3.5 - 5.0 mmol/L    Chloride 115 (H) 98 - 107 mmol/L    CO2 13.0 (L) 22.0 - 30.0 mmol/L    BUN 27 (H) 7 - 21 mg/dL    Creatinine 9.14 (H) 0.60 - 1.00 mg/dL    BUN/Creatinine Ratio 17     EGFR MDRD Non Af Amer 35 (L) >=60 mL/min/1.79m2    EGFR MDRD Af Amer 42 (L) >=60 mL/min/1.52m2    Anion Gap 11 9 - 15 mmol/L    Glucose 85 65 - 99 mg/dL    Calcium 8.8 8.5 - 78.2 mg/dL    Albumin 3.2 (L) 3.5 - 5.0 g/dL    Total Protein 7.0 6.5 - 8.3 g/dL    Total Bilirubin 0.5 0.0 - 1.2 mg/dL    AST 14 14 - 38 U/L    ALT 18 15 - 48 U/L    Alkaline Phosphatase 133 (H) 38 - 126 U/L   Amylase Level    Collection Time: 11/06/16  7:40 AM   Result Value Ref Range    Amylase 43 30 - 110 U/L   Lipase Level    Collection Time: 11/06/16  7:40 AM   Result Value Ref Range    Lipase 78 44 - 232 U/L   Hemoglobin A1c    Collection Time: 11/06/16  7:40 AM   Result Value Ref Range    Hemoglobin A1C 5.6 4.8 - 5.6 %    Estimated Average Glucose 114 mg/dL  CBC w/ Differential    Collection Time: 11/06/16  7:40 AM   Result Value Ref Range    WBC 6.5 4.5 - 11.0 10*9/L    RBC 2.87 (L) 4.00 - 5.20 10*12/L    HGB 7.8 (L) 12.0 - 16.0 g/dL    HCT 16.1 (L) 09.6 - 46.0 %    MCV 91.7 80.0 - 100.0 fL    MCH 27.0 26.0 - 34.0 pg    MCHC 29.4 (L) 31.0 - 37.0 g/dL    RDW 04.5 (H) 40.9 - 15.0 %    MPV 7.5 7.0 - 10.0 fL    Platelet 369 150 - 440 10*9/L    Absolute Neutrophils 4.3 2.0 - 7.5 10*9/L    Absolute Lymphocytes 1.3 (L) 1.5 - 5.0 10*9/L    Absolute Monocytes 0.5 0.2 - 0.8 10*9/L    Absolute Eosinophils 0.2 0.0 - 0.4 10*9/L    Absolute Basophils 0.0 0.0 - 0.1 10*9/L    Large Unstained Cells 3 0 - 4 %    Anisocytosis Slight (A) Not Present    Hypochromasia Marked (A) Not Present   Ferritin    Collection Time: 11/06/16  7:40 AM   Result Value Ref Range    Ferritin 411.0 (H) 3.0 - 151.0 ng/mL   Iron Profile    Collection Time: 11/06/16  7:40 AM   Result Value Ref Range    Iron 27 (L) 35 - 165 ug/dL    TIBC 811.9 (L) 147.8 - 479.0 mg/dL    Transferrin 295.6 (L) 200.0 - 380.0 mg/dL    Iron Saturation (%) 14 (L) 15 - 50 %   Vitamin B12 Level    Collection Time: 11/06/16  7:40 AM   Result Value Ref Range    Vitamin B-12 576 193 - 900 pg/ml   Urinalysis    Collection Time: 11/06/16  8:12 AM   Result Value Ref Range    Color, UA Yellow     Clarity, UA Clear     Specific Gravity, UA 1.020 1.003 - 1.030    pH, UA 6.0 5.0 - 9.0    Leukocyte Esterase, UA Negative Negative    Nitrite, UA Negative Negative    Protein, UA 30 mg/dL (A) Negative    Glucose, UA Negative Negative    Ketones, UA Negative Negative    Urobilinogen, UA 0.2 mg/dL 0.2 mg/dL, 1.0 mg/dL    Bilirubin, UA Negative Negative    Blood, UA Negative Negative    RBC, UA <1 <4 /HPF    WBC, UA 2 0 - 5 /HPF    Squam Epithel, UA 2 0 - 5 /HPF    Bacteria, UA None Seen None Seen /HPF    Mucus, UA Rare (A) None Seen /HPF       Assessment and Plan    1. Status post kidney-pancreas transplant. Her creatinine has improved from her recent hospitalization in July, and is within her most recent baseline of 1.5-1.7. As Derrick as her creatinine remains stable going forward, I would not do a kidney biopsy. Given her issues with infection and lack of history of rejection, will decrease Myfortic from 540 mg to 360 mg twice a day for now. Once her foot is improved and she is free from UTIs, would consider increasing again given history of pancreas transplant. Will follow-up on Prograf level from today. She took her dose last night at 8 PM so today's level will be a good trough. Her goal is around 8.  2. Right foot wound. Still causing her some discomfort, but overall improving. She is seeing orthopedic surgery today.    3. Frequent UTIs. She was unable to attend her urogynecology appointment due to conflict with school, she will reschedule that. Urinalysis today is reassuring. Will follow up culture results.    4. Vaginal discharge. Will treat empirically for yeast infection with fluconazole 150 mg ??1 dose. If that does not resolve her symptoms, would treat her for bacterial vaginosis with Flagyl.    5. Metabolic acidosis and mild hyperkalemia. Will increase sodium bicarbonate from 650 mg twice a day to 1300 mg twice a day.    6. Anemia. I am not surprised that her hemoglobin is low given recent infections. Her iron stores today are low. Would prefer to treat with IV iron, though the patient may not be able to do that with her school schedule. If she is unable to do IV iron will prescribe oral iron.    7. Health maintenance. She received a flu shot in clinic today.    8. Will see patient back in 4 months, or sooner if needed.

## 2016-11-06 ENCOUNTER — Ambulatory Visit
Admission: RE | Admit: 2016-11-06 | Discharge: 2016-11-06 | Disposition: A | Discharge status: Home / Self Care | Payer: MEDICARE

## 2016-11-06 ENCOUNTER — Ambulatory Visit
Admission: RE | Admit: 2016-11-06 | Discharge: 2016-11-06 | Disposition: A | Discharge status: Home / Self Care | Payer: MEDICARE | Attending: Nephrology | Admitting: Nephrology

## 2016-11-06 DIAGNOSIS — E11610 Type 2 diabetes mellitus with diabetic neuropathic arthropathy: Secondary | ICD-10-CM

## 2016-11-06 DIAGNOSIS — D631 Anemia in chronic kidney disease: Secondary | ICD-10-CM

## 2016-11-06 DIAGNOSIS — Z940 Kidney transplant status: Principal | ICD-10-CM

## 2016-11-06 DIAGNOSIS — N182 Chronic kidney disease, stage 2 (mild): Secondary | ICD-10-CM

## 2016-11-06 DIAGNOSIS — Z79899 Other long term (current) drug therapy: Secondary | ICD-10-CM

## 2016-11-06 DIAGNOSIS — T847XXD Infection and inflammatory reaction due to other internal orthopedic prosthetic devices, implants and grafts, subsequent encounter: Principal | ICD-10-CM

## 2016-11-06 DIAGNOSIS — D899 Disorder involving the immune mechanism, unspecified: Secondary | ICD-10-CM

## 2016-11-06 LAB — CBC W/ AUTO DIFF
BASOPHILS ABSOLUTE COUNT: 0 10*9/L (ref 0.0–0.1)
EOSINOPHILS ABSOLUTE COUNT: 0.2 10*9/L (ref 0.0–0.4)
HEMATOCRIT: 26.3 % — ABNORMAL LOW (ref 36.0–46.0)
HEMOGLOBIN: 7.8 g/dL — ABNORMAL LOW (ref 12.0–16.0)
LYMPHOCYTES ABSOLUTE COUNT: 1.3 10*9/L — ABNORMAL LOW (ref 1.5–5.0)
MEAN CORPUSCULAR HEMOGLOBIN CONC: 29.4 g/dL — ABNORMAL LOW (ref 31.0–37.0)
MEAN CORPUSCULAR HEMOGLOBIN: 27 pg (ref 26.0–34.0)
MEAN CORPUSCULAR VOLUME: 91.7 fL (ref 80.0–100.0)
MEAN PLATELET VOLUME: 7.5 fL (ref 7.0–10.0)
MONOCYTES ABSOLUTE COUNT: 0.5 10*9/L (ref 0.2–0.8)
NEUTROPHILS ABSOLUTE COUNT: 4.3 10*9/L (ref 2.0–7.5)
PLATELET COUNT: 369 10*9/L (ref 150–440)
RED BLOOD CELL COUNT: 2.87 10*12/L — ABNORMAL LOW (ref 4.00–5.20)
RED CELL DISTRIBUTION WIDTH: 16.6 % — ABNORMAL HIGH (ref 12.0–15.0)
WBC ADJUSTED: 6.5 10*9/L (ref 4.5–11.0)

## 2016-11-06 LAB — URINALYSIS
BACTERIA: NONE SEEN /HPF
BILIRUBIN UA: NEGATIVE
BLOOD UA: NEGATIVE
GLUCOSE UA: NEGATIVE
KETONES UA: NEGATIVE
LEUKOCYTE ESTERASE UA: NEGATIVE
NITRITE UA: NEGATIVE
PH UA: 6 (ref 5.0–9.0)
PROTEIN UA: 30 — AB
RBC UA: <1 /HPF (ref <4)
SPECIFIC GRAVITY UA: 1.02 (ref 1.003–1.030)
SQUAMOUS EPITHELIAL: 2 /HPF (ref 0–5)
UROBILINOGEN UA: 0.2

## 2016-11-06 LAB — TOTAL IRON BINDING CAPACITY (CALC): Lab: 191.9 — ABNORMAL LOW

## 2016-11-06 LAB — TACROLIMUS, TROUGH: Lab: 5.7

## 2016-11-06 LAB — SPECIFIC GRAVITY UA: Lab: 1.02

## 2016-11-06 LAB — VITAMIN B-12: Cobalamins:MCnc:Pt:Ser/Plas:Qn:: 576

## 2016-11-06 LAB — COMPREHENSIVE METABOLIC PANEL
ALKALINE PHOSPHATASE: 133 U/L — ABNORMAL HIGH (ref 38–126)
ALT (SGPT): 18 U/L (ref 15–48)
ANION GAP: 11 mmol/L (ref 9–15)
AST (SGOT): 14 U/L (ref 14–38)
BILIRUBIN TOTAL: 0.5 mg/dL (ref 0.0–1.2)
BLOOD UREA NITROGEN: 27 mg/dL — ABNORMAL HIGH (ref 7–21)
BUN / CREAT RATIO: 17
CALCIUM: 8.8 mg/dL (ref 8.5–10.2)
CHLORIDE: 115 mmol/L — ABNORMAL HIGH (ref 98–107)
CO2: 13 mmol/L — ABNORMAL LOW (ref 22.0–30.0)
CREATININE: 1.58 mg/dL — ABNORMAL HIGH (ref 0.60–1.00)
EGFR MDRD AF AMER: 42 mL/min/{1.73_m2} — ABNORMAL LOW (ref >=60)
EGFR MDRD NON AF AMER: 35 mL/min/{1.73_m2} — ABNORMAL LOW (ref >=60)
GLUCOSE RANDOM: 85 mg/dL (ref 65–99)
POTASSIUM: 5.2 mmol/L — ABNORMAL HIGH (ref 3.5–5.0)
SODIUM: 139 mmol/L (ref 135–145)

## 2016-11-06 LAB — CREATININE, URINE: Lab: 198.1

## 2016-11-06 LAB — MAGNESIUM: Magnesium:MCnc:Pt:Ser/Plas:Qn:: 2.2

## 2016-11-06 LAB — SMEAR REVIEW

## 2016-11-06 LAB — ESTIMATED AVERAGE GLUCOSE: Estimated average glucose:MCnc:Pt:Bld:Qn:Estimated from glycated hemoglobin: 114

## 2016-11-06 LAB — FERRITIN: Ferritin:MCnc:Pt:Ser/Plas:Qn:: 411 — ABNORMAL HIGH

## 2016-11-06 LAB — PROTEIN / CREATININE RATIO, URINE
PROTEIN URINE: 78.4 mg/dL
PROTEIN/CREAT RATIO, URINE: 0.396

## 2016-11-06 LAB — WBC ADJUSTED: Lab: 6.5

## 2016-11-06 LAB — PHOSPHORUS: Phosphate:MCnc:Pt:Ser/Plas:Qn:: 3.9

## 2016-11-06 LAB — HEMOGLOBIN A1C: ESTIMATED AVERAGE GLUCOSE: 114 mg/dL

## 2016-11-06 LAB — IRON & TIBC
TOTAL IRON BINDING CAPACITY (CALC): 191.9 mg/dL — ABNORMAL LOW (ref 252.0–479.0)
TRANSFERRIN: 152.3 mg/dL — ABNORMAL LOW (ref 200.0–380.0)

## 2016-11-06 LAB — EGFR MDRD NON AF AMER
Glomerular filtration rate/1.73 sq M.predicted.non black:ArVRat:Pt:Ser/Plas/Bld:Qn:Creatinine-based formula (MDRD): 35 — ABNORMAL LOW

## 2016-11-06 LAB — AMYLASE: Chemistry studies:Cmplx:-:^Patient:Set:: 43

## 2016-11-06 LAB — SLIDE REVIEW

## 2016-11-06 LAB — LIPASE: Triacylglycerol lipase:CCnc:Pt:Ser/Plas:Qn:: 78

## 2016-11-06 MED ORDER — AMITRIPTYLINE 25 MG TABLET
ORAL_TABLET | Freq: Three times a day (TID) | ORAL | 3 refills | 0 days | Status: CP
Start: 2016-11-06 — End: 2017-07-11

## 2016-11-06 MED ORDER — FLUCONAZOLE 150 MG TABLET
ORAL_TABLET | Freq: Once | ORAL | 0 refills | 0 days | Status: CP
Start: 2016-11-06 — End: 2016-11-06

## 2016-11-06 MED ORDER — MYCOPHENOLATE SODIUM 180 MG TABLET,DELAYED RELEASE
ORAL_TABLET | Freq: Two times a day (BID) | ORAL | 5 refills | 0.00000 days
Start: 2016-11-06 — End: 2017-01-14

## 2016-11-06 MED ORDER — PROGRAF 1 MG CAPSULE
ORAL_CAPSULE | 5 refills | 0 days
Start: 2016-11-06 — End: 2017-01-14

## 2016-11-06 MED ORDER — SODIUM BICARBONATE 650 MG TABLET
ORAL_TABLET | Freq: Two times a day (BID) | ORAL | 11 refills | 0.00000 days
Start: 2016-11-06 — End: 2017-05-06

## 2016-11-06 NOTE — Unmapped (Signed)
ORTHOPAEDIC POSTOPERATIVE CLINIC NOTE [phone])  Date: 11/06/2016  Attending: Brion Aliment, MD  [facility]    ASSESSMENT:  Rachel [name] is a 46 y.o. female with the following visit diagnoses:  S/P right infected TTC nail removal with chronic right foot ulcers on 7/9, doing well    PLAN:  - No further antibiotics  - Most of her pain is at night, so we have prescribed her elavil to help with her symptoms  - WBAT in hard sole show until receives her diabetic shoes, then switch over  - Custom diabetic shoes re-ordered today    DME Documentation: Lower Extremity - Ambulatory AFOs  The patient was prescribed custom diabetic shoes and insoles.  The primary encounter diagnosis was Infected hardware in right lower extremity, subsequent encounter. A diagnosis of Charcot foot due to diabetes mellitus (CMS-HCC) was also pertinent to this visit..  The patient is ambulatory but has weakness and/or instability of her right lower extremity which requires stabilization from the semi-rigid/rigid orthosis to potentially improve their function.      Scheduling Notes:  Return in about 6 weeks (around 12/18/2016).  - Xrays next visit: none    SUBJECTIVE:  Chief Complaint: Follow-up (right foot, still having pain)    History of Present Illness:   Returns about 8 weeks s/p hardware removal. Since her last visit, she has finished her IV antibiotics which were directed by ID. She denies draining wounds. She has continued wrapping her foot and has been wearing her hard soled shoe at all times. She does report continued pain in her right foot and ankle which has been present since surgery. She was given some tramadol, but this does not help. Pain is sharp in the ankle and is worst at night when she is trying to sleep.  Pain Assessment: 0-10  0-10 Pain Scale: 6  Pain Location: Foot  Pain Orientation: Right    OBJECTIVE:  EXPANDED PHYSICAL EXAM (6 Point)  General Appearance well-nourished and no acute distress, Estimated body mass index is 28.67 kg/m?? as calculated from the following:    Height as of an earlier encounter on 11/06/16: 157.5 cm (5' 2.01).  ??   Weight as of an earlier encounter on 11/06/16: 71.1 kg (156 lb 12.8 oz).   Mood and Affect ?? alert, cooperative and pleasant   Cardiovascular ?? Poor perfusion distally chronically, although remaining toes are warm   Sensation ?? Sensation diminished chronically RLE   MUSCULOSKELETAL    RLE   ?? Incisions benign, no drainage this morning. There is one wound over posterior heel which remains open although is not draining. Decreased sensation to light touch to ankle level.   ?? Wiggles remaining toes, does have midfoot motion, no hindfoot motion     Test Results  Imaging  None new today      No orders of the defined types were placed in this encounter.    Requested Prescriptions     Signed Prescriptions Disp Refills   ??? amitriptyline (ELAVIL) 25 MG tablet 90 tablet 3     Sig: Take 1 tablet (25 mg total) by mouth three (3) times a day (at 6am, noon and 6pm).        Note created by Jasmine Pang Rhealyn Cullen

## 2016-11-06 NOTE — Unmapped (Signed)
Urine specimen was collected at today's visit

## 2016-11-07 LAB — CMV DNA, QUANTITATIVE, PCR: CMV VIRAL LD: NOT DETECTED

## 2016-11-07 LAB — CMV QUANT: Lab: 0

## 2016-11-07 NOTE — Unmapped (Signed)
Labs reviewed with Dr. Carlene Coria, ordered feraheme 510mg  x 2 doses IV, pt asked to come on 9/20 and 9/27.

## 2016-11-08 LAB — C-PEPTIDE: C peptide:MCnc:Pt:Ser/Plas:Qn:: 1.5

## 2016-11-08 NOTE — Unmapped (Signed)
Patient scheduled for 11/15/2016 for first dose of Feraheme.

## 2016-11-08 NOTE — Unmapped (Signed)
-----   Message from Ambrose Pancoast, RN sent at 11/08/2016  9:01 AM EDT -----  Regarding: RE: nurse visit  I already called her and she is aware. Thanks!  ----- Message -----  From: Wendall Papa, RN  Sent: 11/07/2016   5:03 PM  To: Ambrose Pancoast, RN, Dorena Cookey, #  Subject: RE: nurse visit                                  This works! Will you contact her and let her know?  ----- Message -----  From: Ambrose Pancoast, RN  Sent: 11/07/2016   3:32 PM  To: Dorena Cookey, Karleen Hampshire, LPN, #  Subject: nurse visit                                      She needs to come for 2 iron infusions.  She would like to do the first one on 9/20 at 8am and the 2nd 9/27 at 8am.  Let me know if that works for the clinic.  The orders have been placed.    Thanks [employer]

## 2016-11-15 ENCOUNTER — Ambulatory Visit
Admission: RE | Admit: 2016-11-15 | Discharge: 2016-11-15 | Disposition: A | Discharge status: Home / Self Care | Payer: MEDICARE

## 2016-11-15 DIAGNOSIS — S82891P Other fracture of right lower leg, subsequent encounter for closed fracture with malunion: Secondary | ICD-10-CM

## 2016-11-15 DIAGNOSIS — R52 Pain, unspecified: Principal | ICD-10-CM

## 2016-11-15 NOTE — Unmapped (Signed)
Patient her for 1st dose of Feraheme per MD order. Multiple attempts made by 2 nurses to place IV. Patient has issues in past with IV placement. Contacted  Linda in Transplant Infusion room. Patient to be sent to Transplant for Infusion. Transportation called. Patient left clinic in w/c. Transportation at hospital will be notified to take patient to [facility]th [facility]. [facility] Transplant Coordinator called patient having pain in right ankle. Wanting help to get appt. With Ortho doctor.

## 2016-11-15 NOTE — Unmapped (Signed)
ORTHOPAEDIC POSTOPERATIVE CLINIC NOTE [phone])  Date: 11/15/2016  Attending: Brion Aliment, MD  [facility]    ASSESSMENT:  Rachel [name] is a 46 y.o. female with the following visit diagnoses:  S/P right infected TTC nail removal with chronic right foot ulcers on 7/9, with recurrence of varus deformity    PLAN:  - We discussed her progressive deformity and likelihood of continued collapse without intervention. We discussed options including right BKA versus attempt at revision osteotomy and antibiotic-coated TTC nail. She is not interested in undergoing BKA at this time, and would like to pursue revision realignment.   - Consented today; will discuss timing with family, likely in October  - Resume NWB status RLE with walker or knee walker  -Follow-up post-op, no XRs      Scheduling Notes:  Return for post-op.  - Xrays next visit: none    SUBJECTIVE:  Chief Complaint: Follow-up (left foot)    History of Present Illness:   Returns one week after her last visit due to concerns regarding the position of her right foot. She reports that since last week she has noticed progressive deformity of her right ankle, and is now walking on the outside of her foot. She denies new wounds or open sores. She has also had an increase in her foot pain, worst at night.     0-10 Pain Scale: 2    OBJECTIVE:  EXPANDED PHYSICAL EXAM (6 Point)  General Appearance well-nourished and no acute distress, Estimated body mass index is 28.67 kg/m?? as calculated from the following:    Height as of 11/06/16: 157.5 cm (5' 2.01).  ??   Weight as of 11/06/16: 71.1 kg (156 lb 12.8 oz).   Mood and Affect ?? alert, cooperative and pleasant   Cardiovascular ?? Poor perfusion distally chronically, although remaining toes are warm   Sensation ?? Sensation diminished chronically RLE   MUSCULOSKELETAL    RLE   ?? Incisions benign, healing. No new wounds.   ?? Decreased sensation to light touch to ankle level.   ?? Varus deformity of the distal tibia and ankle  ?? Wiggles remaining toes, does have midfoot motion, no hindfoot motion     Test Results  Imaging  XR R ankle: progressive varus collapse through the medial malleolus. No distal tibial shaft fracture.       No orders of the defined types were placed in this encounter.    Requested Prescriptions      No prescriptions requested or ordered in this encounter        Note created by Jasmine Pang Yaffa Seckman

## 2016-11-15 NOTE — Unmapped (Signed)
1005 Patient arrived Transplant Infusion Room today for fareheme infusion, Condition: well; Mobility: via wheelchair; accompanied by self. See Flowsheet and MAR for all details of visit. Pharmacy preparing medication  Attempted PIV x1 in RFA. IV Team paged.  1027 PIV LFA placed by Leelee IV Team, NS KVO.  VS stable,   labs no orders found, urine no orders found.  1055 Infusion initiated. Hoover Browns, RN w pt discussing plan of care  1120 Infusion complete.  1150 VS stable, PIV removed by [place] Sink, RN, pt left clinic, Condition: well; Mobility: via wheelchair; accompanied by transport.

## 2016-11-15 NOTE — Unmapped (Addendum)
PRE-SURGICAL INFORMATION AND CHECKLIST    THE WEEK BEFORE YOUR SURGERY    You will bleed less during surgery if you stop taking anti-inflammatory medications at least one week before surgery.  These include ASPIRIN, IBUPROFIN (Motrin, Advil), NAPROXYN (Naprosyn), INDOMETHACIN (Indocin) and several other prescription anti-inflammatories commonly prescribed for arthritis or other musculoskeletal conditions.  Ask your doctor before discontinuing any medication.    If you normally shave in the area of surgery, please stop shaving for one week (or more) before surgery.  Germs grow in the small razor nicks from shaving and will increase the risk of infection.    THE LAST BUSINESS DAY BEFORE SURGERY    Surgery times are given out from 2:00-5:00 pm on the last business day before scheduled surgery.  A preoperative nurse will call you during this time to confirm when you should report for your surgery.  If you have not been called by 5:00 pm, you may call [facility] at [phone] or [facility] Day Op at [phone].  If you will not be staying at your home address the night before surgery, please call [facility] Day Op to let them know where you can be reached.      THE NIGHT BEFORE YOUR SURGERY    DO NOT EAT OR DRINK ANYTHING AFTER MIDNIGHT, unless instructed otherwise.  Take your usual medications as prescribed, unless instructed otherwise.    THE MORNING OF YOUR SURGERY    Take a shower or bath and wash the surgical area with the antibacterial soap you have been given in the clinic.  If you have been given TED hose, please bring them with you to the hospital.  Remember to bring any crutches, braces, or other equipment that have been supplied for use immediately following your surgery.  You may take your usual medications as prescribed with small sips of water, unless instructed otherwise.  Do not drink a full glass of water.    A RESPONSIBLE ADULT (OVER 18) MUST ACCOMPANY YOU ON THE DAY OF SURGERY AND BE AVAILABLE THROUGHOUT YOUR PROCEDURE IN THE EVENT THERE ARE QUESTIONS OR COMPLICATIONS.    You must have an adult available to take you home following your procedure as it will not be safe for you to drive or take public transportation alone.    Please call the Department of Orthopaedics if you have any questions: [phone]    [facility] ? [phone] ? [place], [place] [address]  Phone [phone] ? Fax [phone]

## 2016-11-16 LAB — HLA DS POST TRANSPLANT
ANTI-DONOR DRW #1 MFI: 123 MFI
ANTI-DONOR DRW #2 MFI: 61 MFI
ANTI-DONOR HLA-A #1 MFI: 0 MFI
ANTI-DONOR HLA-B #1 MFI: 21 MFI
ANTI-DONOR HLA-B #2 MFI: 15 MFI
ANTI-DONOR HLA-C #1 MFI: 0 MFI
ANTI-DONOR HLA-C #2 MFI: 21 MFI
ANTI-DONOR HLA-DQB #1 MFI: 166 MFI
ANTI-DONOR HLA-DR #1 MFI: 88 MFI
ANTI-DONOR HLA-DR #2 MFI: 25 MFI
DONOR DRW ANTIGEN #1: 52
DONOR HLA-A ANTIGEN #1: 2
DONOR HLA-B ANTIGEN #1: 51
DONOR HLA-B ANTIGEN #2: 71
DONOR HLA-C ANTIGEN #1: 7
DONOR HLA-C ANTIGEN #2: 14
DONOR HLA-DQB ANTIGEN #1: 7
DONOR HLA-DR ANTIGEN #1: 4
DONOR HLA-DR ANTIGEN #2: 11

## 2016-11-16 LAB — FSAB CLASS 2 ANTIBODY SPECIFICITY: HLA CL2 AB RESULT: NEGATIVE

## 2016-11-16 LAB — FSAB CLASS 1 ANTIBODY SPECIFICITY: HLA CLASS 1 ANTIBODY RESULT: POSITIVE

## 2016-11-16 LAB — CPRA%: Lab: 0

## 2016-11-16 LAB — HLA CL2 AB COMMENT: Lab: 0

## 2016-11-16 LAB — DSA COMMENT: Lab: 0

## 2016-11-20 NOTE — Unmapped (Signed)
Addended by: Brion Aliment on: 11/20/2016 12:47 PM     Modules accepted: Orders

## 2016-11-21 NOTE — Unmapped (Signed)
[facility] Refill Coordination Note  Specialty Medication(s): MYFORTIC 180MG  AND PROGRAF 1MG   Additional Medications shipped: NO    Makaylen Soeder, DOB: 05-09-1970  Phone: [phone] (home) , Alternate phone contact: N/A  Phone or address changes today?: No  All above HIPAA information was verified with patient.  Shipping Address: [address]  [place] [address]   Insurance changes? No    Completed refill call assessment today to schedule patient's medication shipment from the [facility] [phone]).      Confirmed the medication and dosage are correct and have not changed: Yes, regimen is correct and unchanged.    Confirmed patient started or stopped the following medications in the past month:  No, there are no changes reported at this time.    Are you tolerating your medication?:  Terease reports tolerating the medication.    ADHERENCE    Myfortic 180 mg   Quantity filled last month: 180    # of tablets left on hand: 60  Prograf 1 mg   Quantity filled last month: 270   # of tablets left on hand: 80    Did you miss any doses in the past 4 weeks? Yes.  Shronda reports missing 2 doses of medication therapy in the last 4 weeks.  Nico reports forgetting as the cause of their non-adherance.    FINANCIAL/SHIPPING    Delivery Scheduled: Yes, Expected medication delivery date: 11/28/16     [name] did not have any additional questions at this time.    Delivery address validated in [facility] scheduling system: Yes, address listed in [facility] is correct.    We will follow up with patient monthly for standard refill processing and delivery.      Thank you,  Marletta Lor   [facility] Specialty Pharmacist

## 2016-11-22 ENCOUNTER — Ambulatory Visit
Admission: RE | Admit: 2016-11-22 | Discharge: 2016-11-22 | Disposition: A | Discharge status: Home / Self Care | Payer: MEDICARE

## 2016-11-22 DIAGNOSIS — D631 Anemia in chronic kidney disease: Secondary | ICD-10-CM

## 2016-11-22 DIAGNOSIS — N183 Chronic kidney disease, stage 3 (moderate): Principal | ICD-10-CM

## 2016-11-22 NOTE — Unmapped (Signed)
Pt here for second dose Feraheme. Pt reports having done well after first dose. Pt had IV started by IV team at main hospital due to difficult stick.   0840/ IV started in left forearm with 24g jelco on second attempt. Good blood return. IV NS started at [facility] via pump. Callbell in reach.  0853/ Feraheme 510mg  started at 314ml/hr via pump. Decision to infuse at slower rate to protect the delicate IV site.  0925/ Feraheme infusion complete. NS infusing at [facility]. IV site free from redness and swelling. Pt resting comfortably. Pt remains for 30 minute post infusion observation.  0955/ IV D/C with catheter intact. IV site free from redness and swelling. BP stable. Pt denies S&S rxn.  0958/ Pt aware of S&S to call 911. Pt D/C home in stable condition.

## 2016-11-27 DIAGNOSIS — M960 Pseudarthrosis after fusion or arthrodesis: Principal | ICD-10-CM

## 2016-11-27 MED FILL — MYFORTIC/180MG/TAB: MYFORTIC/180MG/TAB | 30 days supply | Qty: 180 | Fill #4

## 2016-11-27 MED FILL — PROGRAF/1MG/CAP: PROGRAF/1MG/CAP | 30 days supply | Qty: 270 | Fill #4

## 2016-11-28 ENCOUNTER — Inpatient Hospital Stay
Admission: RE | Admit: 2016-11-28 | Discharge: 2016-11-29 | Disposition: A | Discharge status: Home / Self Care | Payer: MEDICARE

## 2016-11-28 ENCOUNTER — Inpatient Hospital Stay
Admission: RE | Admit: 2016-11-28 | Discharge: 2016-11-29 | Disposition: A | Discharge status: Home / Self Care | Payer: MEDICARE | Attending: Student in an Organized Health Care Education/Training Program | Admitting: Student in an Organized Health Care Education/Training Program

## 2016-11-28 DIAGNOSIS — M960 Pseudarthrosis after fusion or arthrodesis: Principal | ICD-10-CM

## 2016-11-28 MED ORDER — GABAPENTIN 100 MG CAPSULE
ORAL_CAPSULE | Freq: Three times a day (TID) | ORAL | 0 refills | 0.00000 days | Status: SS
Start: 2016-11-28 — End: 2017-03-03

## 2016-11-28 MED ORDER — OXYCODONE 5 MG TABLET
ORAL_TABLET | ORAL | 0 refills | 0.00000 days | Status: CP | PRN
Start: 2016-11-28 — End: 2016-12-03

## 2016-11-28 MED ORDER — DOCUSATE SODIUM 100 MG CAPSULE
ORAL_CAPSULE | Freq: Two times a day (BID) | ORAL | 0 refills | 0.00000 days | Status: CP
Start: 2016-11-28 — End: 2016-12-08

## 2016-11-28 MED ORDER — ACETAMINOPHEN 500 MG TABLET
ORAL_TABLET | Freq: Three times a day (TID) | ORAL | 0 refills | 0.00000 days | Status: CP
Start: 2016-11-28 — End: 2016-12-08

## 2016-11-28 MED ORDER — ONDANSETRON 4 MG DISINTEGRATING TABLET
ORAL_TABLET | Freq: Three times a day (TID) | ORAL | 0 refills | 0.00000 days | Status: CP | PRN
Start: 2016-11-28 — End: 2016-12-08

## 2016-11-28 NOTE — Unmapped (Addendum)
Orthopaedic Surgery Operative Note  Date of Surgery: 11/28/2016  Attending: Brion Aliment, MD    Preoperative Diagnosis: Right Malunion Ankle Fusion    Postoperative Diagnosis: SAME    Procedure(s)   Procedure:    ARTHRODESIS, ANKLE, OPEN  CPT(R) Code:  16109 - PR ARTHRODESIS,ANKLE,OPEN    Procedure:    Osteotomy; Tibia  CPT(R) Code:  60454 - PR OSTEOTOMY TIBIA       Resident Physician(s): Gwynneth Macleod, MD    Anesthesia: General    Antibiotics: Ancef 2 g    Tourniquet time: * Missing tourniquet times found for documented tourniquets in log:  [id number] *  Total Tourniquet Time Documented:  Thigh (Right) - 101 minutes  Total: Thigh (Right) - 101 minutes      Estimated Blood Loss: 100 mL    Complications: None    Specimens: None    Indications for Surgery:    The patient is a 46 y.o. year-old female who underwent hindfoot fusion previously with infection, s/p hardware removal to clear the infection and subsequent recurrence of varus hindfoot malalignment. They were indicated for the above procedure, after thorough discussion regarding the risks of pursuing this, patient and family elected to proceed.  Discussion of risks included, but were not limited to, bleeding, infection, damage to nerves and structures, need for future surgery, loss of life or limb, and other anesthesia related risks.    Operative Findings:  Successful distal tibial osteotomy and retrograde hindfoot TTC nail. Cultures sent from reamings. No complications.    Implants:    Implant Name Type Inv. Item Serial No. Manufacturer Lot No. LRB No. Used Action   CEMENT BONE SIMPLEX P W/TOBRAMYCIN ANTIBIOTIC - [id number] Ortho Implant CEMENT BONE SIMPLEX P W/TOBRAMYCIN ANTIBIOTIC  [employer] [id number] Right 1 Implanted   SCREW TI DBL LEAD [id number] - [id number] Spine Implant SCREW TI DBL LEAD [id number]  [employer] [id number] Right 1 Implanted   NAIL ANKLE LOCKING 10X180MM - [id number] Rod Nail NAIL ANKLE LOCKING 10X180MM  [employer] [id number] Right 1 Implanted   SCREW TI DBL LEAD 5X40MM CORTICAL - [id number] Screws Pins Plates SCREW TI DBL LEAD 5X40MM CORTICAL  [employer] [id number] Right 1 Implanted   SCREW CORTICAL TI 5.0X80MM DBL LEAD - [id number] Screws Pins Plates SCREW CORTICAL TI 5.0X80MM DBL LEAD   [employer] [id number] Right 1 Implanted       Procedure:    The patient was identified and brought back to the operating room, where after a preprocedural timeout identify the correct patient, site, and procedure to be performed, the patient was transferred over to the operative table.  The patient received preoperative antibiotics per protocol.  Anesthesia was induced.  The extremity was prepped and draped in the usual sterile fashion.  A final timeout was then called. The tourniquet was elevated.    Her old incisions were identified and part of the lateral incision was incised at the level of the distal tibia. Fluoroscopy and a saw was used to create a transverse osteotomy at the apex of the deformity, which was finished with an osteotome. We then used a curette and rongeur to remove excess lateral bone to allow the hindfoot to swing into neutral alignment. We saved the removed bone for later bone grafting. Once satisfied with our alignment on orthogonal imaging, we incised her prior plantar hindfoot incision and inserted the guide wire in a retrograde fashion into the distal tibia, adjusting position until the talus was under the tibia, and the hindfoot was  in slight valgus alignment.  We then reamed the canal and sent the reamings for culture given her history of infection. We thoroughly irrigated the canal with saline. We then placed bone graft into the osteotomy defect anteromedially.On the back table, we selected an appropriately sized nail and created an antibiotic coating using vancomycin powder and a chest tube. Once the antibiotic coating was hard, the chest tubing was cut away, and the screw holes were drilled free of cement. We then placed the nail across the tibiotalar and talocalcaneal joints in a retrograde fashion over the guidewire. We backslapped the nail slightly to maximize tibial bone for placement of our transverse interlocking screw. This was then placed under fluoroscopic guidance from lateral to medial using the jig. We then placed the posterior to anterior calcaneal screw using the jig, followed by the proximal tibial interlocking screw. Final fluoroscopic orthogonal views confirmed appropriate coronal and sagittal plane alignment with the hindfoot in neutral alignment.     The incisions were copiously washed with normal saline.  The lateral wound was closed in layered fashion, all others with skin sutures. A soft, sterile dressing was then placed followed by a PRAFO boot.  The patient was awoken from general anesthesia and transferred back to a stretcher.  They were taken to the PACU without apparent complications at the conclusion of the procedure.      Post-op Plan/Instructions:     The patient will be admitted to extended recovery.  she will be non weight-bearing on the operative extremity.  DVT prophylaxis will be with aspirin, TEDs and SCDs. .  Pain control with PRN medications and nerve block.  Follow up plan will be in 10-14 days for suture removal. .  .    Teaching Surgeon Attestation: Dr. Edwena Blow was present during all key portions of the procedure.    Rachel [name]   Date: 11/28/2016  Time: 11:21 AM     I was present and scrubbed for the entirety of the surgery.    Kathlyn Sacramento. Edwena Blow, M.D.

## 2016-11-28 NOTE — Unmapped (Signed)
PHYSICAL THERAPY  Evaluation (11/28/16 1420)     Patient Name:  Rachel [name]       Medical Record Number: [id number]   Date of Birth: [birth year]  Sex: Female            Treatment Diagnosis: Patient with limited functional mobility 2/2 NWB RLE, gait instability.     ASSESSMENT    Rachel [name] is a 46 y.o. female with PMHx renal transplants x2, pancreatic transplant, PAD, recurrent UTIs, that presented to [facility] with Urinary tract infection. Now s/p distal tibial osteotomy and retrograde hindfoot TTC nail 10/3. Patient presents to PT today with decreased functional mobility secondary deficits including but not limited to NWB RLE, gait instability. Patient reports that she is independent with functional mobility having NWB RLE 2/2 to this not being the first time she has had ankle surgery. Patient able to perform transfers with RW as well as to/from knee scooter without assistance and with good safety awareness. Patient able to ambulate short distances with RW, but prefers to use knee scooter, which she has used for previous surgeries. Patient reports having very good family support and reports that she does not need physical therapy. Based on the AM-PAC 6 item raw score of 20/24, the patient is considered to be 33.32% impaired with basic mobility. Patient has adequate functional mobility/safety awareness to return home with assist from family PRN. Patient does not have skilled acute PT needs at this time, patient/family agreed/indicated understanding. Will d/c PT, please re-consult if needs arise. Do not recommend any post-acute PT. Patient requiring low complexity evaluation based on acute hospital problems, PMH, and personal factors. Patient with stable clinical presentation.     Today's Interventions: AM-PAC:20/24; eval/mobility assessment static/dynamic standing balance, patient/family ed on: PT role POC, Importance of regular mobility with assistance as needed, elevation/icing, HEP (SLR, hip abd/add, marching, LAQ), and safety awareness with mobility. All questions answered.     Activity Tolerance: Patient tolerated treatment well    PLAN  Planned Frequency of Treatment:  D/C Services for: D/C Services      Planned Interventions:      Post-Discharge Physical Therapy Recommendations:  PT services not indicated        PT DME Recommendations: None     Goals:   Patient and Family Goals: to go home                                                                                                        Prognosis:  Good  Barriers to Discharge: None  Positive Indicators: Age, PLOF, caregiver support, motivation.     SUBJECTIVE  Patient reports: Pt/RN agreeable to PT.   Current Functional Status: Pt rec'd reclined in bed, NAD; left reclined in bed, sister at bedside, lines intact, needs within reach, RN Girum at bedside  Services patient receives: PT;OT  Prior functional status: Pt reports she was independent with all ADLs, household and community amb without device, denies any falls; after previous ankle surgeries, patient had been mostly using knee-scooter for ambulation.   Equipment available at home: Cane;Rolling  Walker;Knee scooter;Wheelchair-manual    Past Medical History:   Diagnosis Date   ??? Anemia    ??? Chronic kidney disease    ??? CTS (carpal tunnel syndrome)    ??? Diabetes mellitus (CMS-HCC)     history prior to pancreas transplant-a1c 5.2 2016 without DM meds   ??? GERD (gastroesophageal reflux disease)    ??? Hyperlipidemia 01/31/2009   ??? Hypertension    ??? Kidney transplanted    ??? Pancreas transplanted (CMS-HCC)    ??? Pneumonia 2010    Social History   Substance Use Topics   ??? Smoking status: Never Smoker   ??? Smokeless tobacco: Never Used   ??? Alcohol use No      Past Surgical History:   Procedure Laterality Date   ??? CARPAL TUNNEL RELEASE     ??? CATARACT EXTRACTION, BILATERAL     ??? FOOT SURGERY     ??? KIDNEY AND PANCREAS TRANSPLANT 2010     ??? KIDNEY TRANSPLANT 1998     ??? PR DEBRIDEMENT, SKIN, SUB-Q TISSUE,MUSCLE,BONE,=<20 SQ CM Right 02/22/2013    Procedure: DEBRIDEMENT; SKIN, SUBCUTANEOUS TISSUE, MUSCLE, & BONE FOOT;  Surgeon: Alben Deeds, MD;  Location: MAIN OR [facility];  Service: Vascular   ??? PR DEBRIDEMENT, SKIN, SUB-Q TISSUE,MUSCLE,BONE,=<20 SQ CM Right 06/10/2013    Procedure: DEBRIDEMENT; SKIN, SUBCUTANEOUS TISSUE, MUSCLE, & BONE;  Surgeon: Madelaine Bhat, DPM;  Location: ASC OR [facility];  Service: Vascular   ??? PR DEBRIDEMENT, SKIN, SUB-Q TISSUE,MUSCLE,BONE,=<20 SQ CM Right 07/22/2013    Procedure: DEBRIDEMENT; SKIN, SUBCUTANEOUS TISSUE, MUSCLE, & BONE;  Surgeon: Madelaine Bhat, DPM;  Location: ASC OR [facility];  Service: Vascular   ??? PR INSERTION DRUG IMPLANT DEVICE Right 09/03/2016    Procedure: Insertion, Non-Biodegradable Drug Delivery Implant;  Surgeon: Garen Grams, MD;  Location: MAIN OR [facility];  Service: Ortho Trauma   ??? PR PARTIAL REMOVAL OF TIBIA Right 09/03/2016    Procedure: Partial Excision, Bone; Tibia;  Surgeon: Garen Grams, MD;  Location: MAIN OR [facility];  Service: Ortho Trauma   ??? PR REMOVAL OF ANKLE IMPLANT Right 09/03/2016    Procedure: REMOVAL OF ANKLE IMPLANT;  Surgeon: Garen Grams, MD;  Location: MAIN OR [facility];  Service: Ortho Trauma   ??? SKIN GRAFTS     ??? TOE AMPUTATION      h/o 5th toe. 02/22/13 Great toe Right due to osteo    Family History   Problem Relation Age of Onset   ??? Diabetes Mother    ??? Breast cancer Sister         Paternal 1/2 sister   ??? Hypertension Sister    ??? Hypertension Brother    ??? Arrhythmia Brother    ??? Diabetes Maternal Aunt    ??? Diabetes Maternal Uncle    ??? Cancer Maternal Grandmother    ??? No Known Problems Paternal Aunt    ??? No Known Problems Paternal Uncle    ??? No Known Problems Maternal Grandfather    ??? No Known Problems Paternal Grandmother    ??? No Known Problems Paternal Grandfather    ??? Clotting disorder Neg Hx    ??? Anesthesia problems Neg Hx    ??? Broken bones Neg Hx    ??? Collagen disease Neg Hx    ??? Dislocations Neg Hx    ??? Fibromyalgia Neg Hx    ??? Gout Neg Hx    ??? Hemophilia Neg Hx    ??? Osteoporosis Neg Hx    ???  Rheumatologic disease Neg Hx    ??? Scoliosis Neg Hx    ??? Severe sprains Neg Hx    ??? Sickle cell anemia Neg Hx    ??? Spinal Compression Fracture Neg Hx    ??? Alcohol abuse Neg Hx    ??? Depression Neg Hx    ??? Drug abuse Neg Hx    ??? Mental illness Neg Hx    ??? Stroke Neg Hx         Allergies: Oxycodone-acetaminophen                Objective Findings              Precautions: Other precautions;Isolation precautions (Contact Iso; protective precautions)              Weight Bearing Status: RLE NWB - Non weight bearing              Required Braces or Orthoses: LLE AFO - Ankle-foot orthoses (contracture boot)    Communication Preference: Verbal  Pain Comments: no c/o pain  Medical Tests / Procedures: Reviewed orders, labs, imaging, vitals, and POC in EPIC.   Equipment / Environment: Vascular access (PIV, TLC, Port-a-cath, PICC);SCD (peripheral nerve catheter)    At Rest: VSS per EPIC  With Activity: NAD  Orthostatics: asymptomatic  Airway Clearance: sitting/standing EOB    Living environment: House  Lives With: Mother (sister lives close by; mother is home during the day to assist PRN)  Home Living: One level home;Standard height toilet;Tub/shower unit;Stairs to enter with rails  Rail placement (outside):  (single)     Number of Stairs: 1    Cognition: Pt answers all questions appropriately and responded to commands   Visual / Perception Status: glasses  Skin Inspection: visible skin intact, Bandage/contracture boot R foot    UE ROM: WFL  UE Strength: WFL  LE ROM: R ankle not assessed, otherwise WFL  LE Strength: R ankle not assessed, otherwise WFL                Coordination: intact   Proprioception: intact  Sensation: numbness below knee RLE 2/2 nerve block  Balance: Sitting: independent; standing static: Mod I with RW/knee scooter; standing dynamic: supervision with RW/knee scooter   Posture: rounded shoulders      Bed Mobility: supine<>sit mod indep with HOB elevated Transfers: sit<>stand with RW and knee scooter, supervision, able to maintain NWB RLE    Gait: Patient able to take steps at bedside with RW, supervision. Patient able to transfer to knee scooter and activate/deactivate breaks.    Stairs: NT      Endurance: Patient with no objective signs of decreased endurance.     Eval Duration(PT): 26 Min.    Medical Staff Made Aware: RN Girum aware  PT G-Codes  Functional Assessment Tool Used: AMPAC  Score: 20/24  Functional Limitation: Mobility: Walking and moving around  Mobility: Walking and Moving Around Current Status (Z6109): At least 20 percent but less than 40 percent impaired, limited or restricted  Mobility: Walking and Moving Around Goal Status [phone]): At least 20 percent but less than 40 percent impaired, limited or restricted  Mobility: Walking and Moving Around Discharge Status [phone]): At least 20 percent but less than 40 percent impaired, limited or restricted  Rationale: Based on the AM-PAC 6 item raw score of 20/24, the patient is considered to be 33.32% impaired with basic mobility.   I attest that I have reviewed the above information.  Signed: Manus Gunning  Alexis Frock, PT  Filed 11/28/2016

## 2016-11-28 NOTE — Unmapped (Signed)
ORTHOPAEDIC POSTOPERATIVE CLINIC NOTE [phone])  Date: 11/15/2016  Attending: Brion Aliment, MD  [facility]    ASSESSMENT:  Rachel [name] is a 46 y.o. female with the following visit diagnoses:  S/P right infected TTC nail removal with chronic right foot ulcers on 7/9, with recurrence of varus deformity    PLAN:  - We discussed her progressive deformity and likelihood of continued collapse without intervention. We discussed options including right BKA versus attempt at revision osteotomy and antibiotic-coated TTC nail. She is not interested in undergoing BKA at this time, and would like to pursue revision realignment.   - Consented today; will discuss timing with family, likely in October  - Resume NWB status RLE with walker or knee walker  -Follow-up post-op, no XRs      Scheduling Notes:  Return for post-op.  - Xrays next visit: none    SUBJECTIVE:  Chief Complaint: Follow-up (left foot)    History of Present Illness:   Returns one week after her last visit due to concerns regarding the position of her right foot. She reports that since last week she has noticed progressive deformity of her right ankle, and is now walking on the outside of her foot. She denies new wounds or open sores. She has also had an increase in her foot pain, worst at night.     0-10 Pain Scale: 2    OBJECTIVE:  EXPANDED PHYSICAL EXAM (6 Point)  General Appearance well-nourished and no acute distress, Estimated body mass index is 28.67 kg/m?? as calculated from the following:    Height as of 11/06/16: 157.5 cm (5' 2.01).  ??   Weight as of 11/06/16: 71.1 kg (156 lb 12.8 oz).   Mood and Affect ?? alert, cooperative and pleasant   Cardiovascular ?? Poor perfusion distally chronically, although remaining toes are warm   Sensation ?? Sensation diminished chronically RLE   MUSCULOSKELETAL    RLE   ?? Incisions benign, healing. No new wounds.   ?? Decreased sensation to light touch to ankle level.   ?? Varus deformity of the distal tibia and ankle  ?? Wiggles remaining toes, does have midfoot motion, no hindfoot motion     Test Results  Imaging  XR R ankle: progressive varus collapse through the medial malleolus. No distal tibial shaft fracture.       No orders of the defined types were placed in this encounter.    Requested Prescriptions      No prescriptions requested or ordered in this encounter        Note created by Jasmine Pang Yaffa Seckman

## 2016-11-28 NOTE — Unmapped (Signed)
ORTHOPAEDIC POST-OP NOTE    - Current orthopaedic contact resident: Gwynneth Macleod (pager [phone])  - Current orthopaedic care attending: Dr. Edwena Blow    - On nights (6pm-6am), weekends, and holidays, please page Orthopaedic Consult pager with questions.    ASSESSMENT:  Rachel [name] is a 46 y.o. female s/p R distal tibial osteotomy and revision R TTC nail on 11/28/2016 doing well postoperatively.    PLAN:  -NWB RLE in PRAFO boot  -Nerve block per anesthesia  -PT/OT  -Keep dssg intact  -Elevate operative extremity as tolerated  -Pain control: PRN's, gradual transition to PO's  -Continue postoperative Abx x 24h  -DVT Ppx: mechanical & daily ASA  -Discharge plan: pending PT/OT clearance & pain control  -Follow up plan: 10-14 days post-op      SUBJECTIVE:  Denies pain in right ankle.  No chest pain. No SOB. No nausea/vomiting. No other complaints.    OBJECTIVE:  PE:  BP 136/60  - Pulse 87  - Temp 36.4 ??C (Temporal)  - Resp 21  - SpO2 100%     Location of operation: RLE  Dressing c/d/i.  Block in place. No active toe wiggle, decreased sensation across toes. Toes WWP. Compartments soft.

## 2016-11-29 NOTE — Unmapped (Signed)
ORTHOPAEDIC POST-OP NOTE    - Current orthopaedic contact resident: Gwynneth Macleod (pager [phone])  - Current orthopaedic care attending: Dr. Edwena Blow    - On nights (6pm-6am), weekends, and holidays, please page Orthopaedic Consult pager with questions.    ASSESSMENT:  Rachel [name] is a 46 y.o. female s/p R distal tibial osteotomy and revision R TTC nail on 11/28/2016 doing well postoperatively.    PLAN:  -NWB RLE in PRAFO boot  -Nerve block per anesthesia--pain ball to be hooked up this AM  -PT/OT  -Keep dssg intact  -Elevate operative extremity as tolerated  -Pain control: PRN's, gradual transition to PO's  -Continue postoperative Abx x 24h  -DVT Ppx: mechanical & daily ASA  -Discharge plan: pending PT/OT clearance & pain control  -Follow up plan: 10-14 days post-op      SUBJECTIVE:  Patient reports pain is controlled, got up with PT yesterday with her knee scooter.    OBJECTIVE:  PE:  BP 156/71  - Pulse 86  - Temp 36.8 ??C (Oral)  - Resp 18  - Ht 157.5 cm (5' 2)  - Wt 71.7 kg (158 lb)  - SpO2 99%  - BMI 28.90 kg/m??     Location of operation: RLE  Dressing c/d/i.  Block in place. No active toe wiggle, decreased sensation across toes. Toes WWP. Compartments soft.

## 2016-11-29 NOTE — Unmapped (Signed)
Addendum  created 11/29/16 1254 by Sheryn Bison, MD    Sign clinical note

## 2016-11-29 NOTE — Unmapped (Signed)
Problem: Fall Risk (Adult)  Goal: Absence of Fall  Patient will demonstrate the desired outcomes by discharge/transition of care.   Outcome: Progressing

## 2016-11-29 NOTE — Unmapped (Signed)
OCCUPATIONAL THERAPY  Evaluation (11/29/16 0814)    Patient Name:  Rachel [name]       Medical Record Number: [id number]   Date of Birth: January 21, 1971  Sex: Female          OT Treatment Diagnosis:  Pt presents at functional baseline with ADLs/mobility    Assessment  Rachel [name] is a 46 y.o. female with PMHx renal transplants x2, pancreatic transplant, PAD, recurrent UTIs, that presented to [facility] with Urinary tract infection. Now s/p distal tibial osteotomy and retrograde hindfoot TTC nail 10/3. Pt is agreeable to session and following commands. Pt presents this date at functional baseline with ADLs/mobility, pt demonstrating ability to complete LBD ADLs, functional transfers, standing ADLs, and functional mobility with knee scooter with supervision/MOD I. Pt demonstrates good safety awareness with use of knee scooter including slow pace, use of breaks, and no LOB. Per pt's PLOF, co-morbidities, current functional deficits, and clinical reasoning, pt presents as a low complexity evaluation.     Based on the daily activity AM-PAC raw score of 21/24, the pt is considered to be 32.79% impaired with self care. This indicates acute OT services are not indicated, no post acute OT recommended at this time, (mother available to assist 24-7 PRN). Recommend tub transfer bench if pt d/c home.          Activity Tolerance During Today's Session  Patient tolerated treatment well    Plan  Planned Frequency of Treatment:  D/C Services for: D/C Services       Post-Discharge Occupational Therapy Recommendations:  OT Post Acute Discharge Recommendations: OT services not indicated    ;    OT DME Recommendations: Tub Transfer Bench       Prognosis:  Good  Positive Indicators:  PLOF, family support    Subjective  Current Status Pt received/left supine in bed with all needs within reach and Huntley Dec RN aware.   Prior Functional Status PTA pt was independent with ADL and IADL. Pt does not drive, mother and sister assist with IADLs. Pt denies falls. Pt reports she has been sitting on edge of tub to shower, keeping RLE out of shower.    Medical Tests / Procedures: Reviewed in EPIC  Services patient receives: OT  Patient / Caregiver reports: I have been using this since 2016--knee scooter    Past Medical History:   Diagnosis Date   ??? Anemia    ??? Chronic kidney disease    ??? CTS (carpal tunnel syndrome)    ??? Diabetes mellitus (CMS-HCC)     history prior to pancreas transplant-a1c 5.2 2016 without DM meds   ??? GERD (gastroesophageal reflux disease)    ??? Hyperlipidemia 01/31/2009   ??? Hypertension    ??? Kidney transplanted    ??? Pancreas transplanted (CMS-HCC)    ??? Pneumonia 2010    Social History   Substance Use Topics   ??? Smoking status: Never Smoker   ??? Smokeless tobacco: Never Used   ??? Alcohol use No      Past Surgical History:   Procedure Laterality Date   ??? CARPAL TUNNEL RELEASE     ??? CATARACT EXTRACTION, BILATERAL     ??? FOOT SURGERY     ??? KIDNEY AND PANCREAS TRANSPLANT 2010     ??? KIDNEY TRANSPLANT 1998     ??? PR DEBRIDEMENT, SKIN, SUB-Q TISSUE,MUSCLE,BONE,=<20 SQ CM Right 02/22/2013    Procedure: DEBRIDEMENT; SKIN, SUBCUTANEOUS TISSUE, MUSCLE, & BONE FOOT;  Surgeon: Alben Deeds, MD;  Location: MAIN  OR [facility];  Service: Vascular   ??? PR DEBRIDEMENT, SKIN, SUB-Q TISSUE,MUSCLE,BONE,=<20 SQ CM Right 06/10/2013    Procedure: DEBRIDEMENT; SKIN, SUBCUTANEOUS TISSUE, MUSCLE, & BONE;  Surgeon: Madelaine Bhat, DPM;  Location: ASC OR [facility];  Service: Vascular   ??? PR DEBRIDEMENT, SKIN, SUB-Q TISSUE,MUSCLE,BONE,=<20 SQ CM Right 07/22/2013    Procedure: DEBRIDEMENT; SKIN, SUBCUTANEOUS TISSUE, MUSCLE, & BONE;  Surgeon: Madelaine Bhat, DPM;  Location: ASC OR [facility];  Service: Vascular   ??? PR INSERTION DRUG IMPLANT DEVICE Right 09/03/2016    Procedure: Insertion, Non-Biodegradable Drug Delivery Implant;  Surgeon: Garen Grams, MD;  Location: MAIN OR [facility];  Service: Ortho Trauma   ??? PR PARTIAL REMOVAL OF TIBIA Right 09/03/2016    Procedure: Partial Excision, Bone; Tibia; Surgeon: Garen Grams, MD;  Location: MAIN OR [facility];  Service: Ortho Trauma   ??? PR REMOVAL OF ANKLE IMPLANT Right 09/03/2016    Procedure: REMOVAL OF ANKLE IMPLANT;  Surgeon: Garen Grams, MD;  Location: MAIN OR [facility];  Service: Ortho Trauma   ??? SKIN GRAFTS     ??? TOE AMPUTATION      h/o 5th toe. 02/22/13 Great toe Right due to osteo    Family History   Problem Relation Age of Onset   ??? Diabetes Mother    ??? Breast cancer Sister         Paternal 1/2 sister   ??? Hypertension Sister    ??? Hypertension Brother    ??? Arrhythmia Brother    ??? Diabetes Maternal Aunt    ??? Diabetes Maternal Uncle    ??? Cancer Maternal Grandmother    ??? No Known Problems Paternal Aunt    ??? No Known Problems Paternal Uncle    ??? No Known Problems Maternal Grandfather    ??? No Known Problems Paternal Grandmother    ??? No Known Problems Paternal Grandfather    ??? Clotting disorder Neg Hx    ??? Anesthesia problems Neg Hx    ??? Broken bones Neg Hx    ??? Collagen disease Neg Hx    ??? Dislocations Neg Hx    ??? Fibromyalgia Neg Hx    ??? Gout Neg Hx    ??? Hemophilia Neg Hx    ??? Osteoporosis Neg Hx    ??? Rheumatologic disease Neg Hx    ??? Scoliosis Neg Hx    ??? Severe sprains Neg Hx    ??? Sickle cell anemia Neg Hx    ??? Spinal Compression Fracture Neg Hx    ??? Alcohol abuse Neg Hx    ??? Depression Neg Hx    ??? Drug abuse Neg Hx    ??? Mental illness Neg Hx    ??? Stroke Neg Hx         Oxycodone-acetaminophen     Objective Findings  Precautions / Restrictions  Isolation precautions;Falls precautions (Contact precautions)    Weight Bearing  RLE NWB - Non weight bearing    Required Braces or Orthoses  RLE PRAFO    Communication Preference  Verbal    Pain  No pain reported    Equipment / Environment  Vascular access (PIV, TLC, Port-a-cath, PICC) (Nerve block to RLE)    Living Situation   Living environment: House   Lives With: Mother (Sister lives close by, mother home 24-7 to assist PRN)   Home Living: One level home;Standard height toilet;Tub/shower unit;Stairs to enter with rails   Equipment available at home: [employer] scooter;Wheelchair-manual   Rail placement (  outside): Rail on right side        Cognition   Orientation Level:  Oriented x 4   Arousal/Alertness:  Appropriate responses to stimuli   Attention Span:  Appears intact   Memory:  Appears intact   Following Commands:  Follows all commands and directions without difficulty   Safety Judgment:  Good awareness of safety precautions   Awareness of Errors:  Good awareness of safety precautions   Problem Solving:  Able to problem solve independently      Vision / Perception  Vision: Wears glasses all the time  Perception: Apperrs intact       Hand Function  Hand Dominance: RHD  B grip WFL    Skin Inspection  RLE ankle wrapped, PRAFO applied    ROM / Strength/Coordination  UE ROM/ Strength/ Coordination: BUE AROM WFL, strength WFL  LE ROM/ Strength/ Coordination: able to make figure 4 with LLE for LBD, defer to PT for further details.     Sensation:  Pt endorses numbness in RLE from nerve block only    Balance:  Seated=MOD I, standing=supervision    Mobility/Gait/Transfers: supine-sit=MOD I, sit-stand=supervision (min cues for NWB in RLE), stand pivot transfer to knee scooter=supervision, functional mobility with knee scooter=supervision    ADL:  Bathing: MIN A seated  Grooming: indep  Dressing: UBD=indep, LBD=supervision  Eating: Indep  Toileting: Supervision  IADLs: NT    Vitals/ Orthostatics:  At Rest: NAD  With Activity: NAD  Orthostatics: NA    Interventions Performed During Today's Session: AMPAC 21/24: Pt educated on role of OT, POC, fall precautions, bed mobility activities, LBD activities, safe transfer techniques, NWB restrictions in RLE, safe use of knee scooter including breaks, standing grooming tasks at the sink with knee scooter, functional mobility with knee scooter, DME/DC recs including tub transfer bench.     OT G-codes  Functional Assessment Tool Used: AMPAC   Score: 21/24  Functional Limitation: Self care  Self Care Current Status (G2952): At least 20 percent but less than 40 percent impaired, limited or restricted  Self Care Goal Status (W4132): At least 20 percent but less than 40 percent impaired, limited or restricted  Self Care Discharge Status [phone]): At least 20 percent but less than 40 percent impaired, limited or restricted  Rationale: 32.79% impairment in self care    Eval Duration (OT): 24 Min.    Medical Staff Made Aware: Huntley Dec RN    I attest that I have reviewed the above information.  Signed: Caleen Essex, OT  Filed 11/29/2016

## 2016-11-29 NOTE — Unmapped (Signed)
Addendum  created 11/29/16 0955 by Shelby Dubin, RN    Edit Component-style Discharge Instructions

## 2016-11-29 NOTE — Unmapped (Signed)
POPLITEAL SCIATIC NERVE CATHETER  FOR PAIN CONTROL    You have received a nerve block for pain control after surgery. A small catheter has been placed to deliver local anesthetic (numbing medicine) into the space containing nerves providing sensation to the lower leg and foot.    The ???balloon-like??? device attached to the catheter will automatically deliver local anesthetic through this catheter for approximately 48 hours. While the nerve block is in place you should:    ? Keep the dressing clean and dry  ? Be aware of the position of your leg and protect it from compression or other injury (since you may not have normal sensation)  ? Be aware that it is normal to see a small about of blood or fluid collecting beneath the dressing    You may also take any pain medicine provided by your surgeon as needed.    On Saturday 10/6, the bag will be nearly empty.    At this time, simply remove the dressing and pull out the catheter. The entire assembly can be thrown away.    IF YOU EXPERIENCE ANY OF THE FOLLOWING, PLEASE CLAMP THE PUMP AND CALL THE PHYSICIAN BELOW:    Ringing in Ears Metallic taste in Mouth Worsening Dizziness   Muscle Twitching Convulsions      If you have any questions or concerns, you may call any time:      1) Acute Pain Service Resident on-call pager: [phone]  2) You may also call the pump manufacturer???s  On-Q 24 hour hotline @ [phone]

## 2016-11-29 NOTE — Unmapped (Signed)
Problem: Patient Care Overview  Goal: Plan of Care Review   11/28/16 1748   OTHER   Plan of Care Reviewed With patient   Plan of Care Review   Progress improving     Goal: Individualization and Mutuality  Outcome: Progressing    Goal: Discharge Needs Assessment   11/28/16 1748   OTHER   Equipment Currently Used at Home other (see comments)  (knee walker)   Discharge Needs Assessment   Concerns to be Addressed no discharge needs identified     Goal: Interprofessional Rounds/Family Conf   11/28/16 1748   Interdisciplinary Rounds/Family Conf   Participants nursing;physician   Pt stable no distress noted, pain well controled with nerve block. VS wnl, resting in bed, will continue to monitor.    Problem: Fall Risk (Adult)  Intervention: Monitor/Assist with Self Care   11/28/16 1748   Interventions   Activity Assistance Provided assistance, 1 person;assistance, stand-by   Self-Care Promotion BADL personal objects within reach     Intervention: Reduce Risk/Promote Restraint Free Environment   11/28/16 1500   Interventions   Safety Interventions fall reduction program maintained;family at bedside;isolation precautions;low bed;nonskid shoes/slippers when out of bed     Intervention: Review Medications/Identify Contributors to Fall Risk   11/28/16 1748   Interventions   Medication Review/Management medications reviewed;high risk medications identified       Goal: Identify Related Risk Factors and Signs and Symptoms  Related risk factors and signs and symptoms are identified upon initiation of Human Response Clinical Practice Guideline (CPG).    11/28/16 1748   Fall Risk (Adult)   Related Risk Factors (Fall Risk) gait/mobility problems   Signs and Symptoms (Fall Risk) presence of risk factors     Goal: Absence of Fall  Patient will demonstrate the desired outcomes by discharge/transition of care.    11/28/16 1748   Fall Risk (Adult)   Absence of Fall making progress toward outcome

## 2016-11-29 NOTE — Unmapped (Addendum)
Care Management  Initial Transition Planning Assessment        Per Orthopaedic Post-Op Note:  Rachel [name] is a 46 y.o. female s/p R distal tibial osteotomy and revision R TTC nail on 11/28/2016 doing well postoperatively.         Medical Provider(s): Arlyss Gandy, MD     Reason for Admission: Admitting Diagnosis:  malunion ankle fusion  Past Medical History:   has a past medical history of Anemia; Chronic kidney disease; CTS (carpal tunnel syndrome); Diabetes mellitus (CMS-HCC); GERD (gastroesophageal reflux disease); Hyperlipidemia (01/31/2009); Hypertension; Kidney transplanted; Pancreas transplanted (CMS-HCC); and Pneumonia (2010).  Past Surgical History:   has a past surgical history that includes Cataract extraction, bilateral; KIDNEY TRANSPLANT 1998; KIDNEY AND PANCREAS TRANSPLANT 2010; Toe amputation; SKIN GRAFTS; pr debridement, skin, sub-q tissue,muscle,bone,=<20 sq cm (Right, 02/22/2013); pr debridement, skin, sub-q tissue,muscle,bone,=<20 sq cm (Right, 06/10/2013); pr debridement, skin, sub-q tissue,muscle,bone,=<20 sq cm (Right, 07/22/2013); Carpal tunnel release; Foot surgery; pr removal of ankle implant (Right, 09/03/2016); pr insertion drug implant device (Right, 09/03/2016); and pr partial removal of tibia (Right, 09/03/2016).   Previous admit date: 08/25/2016    Primary Insurance- Payor: MEDICARE / Plan: MEDICARE PART A AND PART B / Product Type: *No Product type* /   Secondary Insurance ??? Secondary Insurance  MEDICAID [place]  Prescription Coverage ??? Medicare  Preferred Pharmacy - [facility] - [place], [place] - [address]  [email or web address] [place], [place] - [address] AT [facility]  [facility] - [place], [place] - [address]    Transportation home: Private vehicle  Level of function prior to admission: Independent with Knee rollator walker                General  Care Manager assessed the patient by : In person interview with patient, Medical record review, Discussion with Clinical Care team  Orientation Level: Oriented X4  Who provides care at home?: N/A    Contact/Decision Maker:    Contact Details  Contact Details: Primary Contact  Primary Contact Name: Patient Rachel [name]:  (h)  [phone] (c) [phone]  Sister:  Rachel [name]  [phone]  Mother:  Rachel [name]  [phone]  Primary Contact Relationship: Sibling  Phone #1: Patient Rachel [name]:  (h)  [phone] (c) [phone]  Sister:  Rachel [name]  [phone]    Phone #2: Mother:  Rachel [name]  [phone]    Advance Directive (Medical Treatment)  Does patient have an advance directive covering medical treatment?: Patient does not have advance directive covering medical treatment.  Reason patient does not have an advance directive covering medical treatment:: Patient does not wish to complete one at this time  Surrogate decision maker appointed:: No  Information provided on advance directive:: No  Patient requests assistance:: No    Advance Directive (Mental Health Treatment)  Does patient have an advance directive covering mental health treatment?: Patient does not have advance directive covering mental health treatment.    Patient Information:    Lives with: Parent (Lives with mother in one story house with one step in.)    Type of Residence: Private residence        Location/Detail: [address] [place]  [address]    Support Systems: Family Members, Parent    Responsibilities/Dependents at home?: No    Home Care services in place prior to admission?: No     Equipment Currently Used at Home: Knee Rollator Walker     Currently receiving outpatient  dialysis?: No     Financial Information:     Patient source of income: Medicaid    Need for financial assistance?: No       Discharge Needs Assessment:    Concerns to be Addressed: no discharge needs identified, denies needs/concerns at this time    Clinical Risk Factors: Multiple Diagnoses (Chronic)    Barriers to taking medications: No    Prior overnight hospital stay or ED visit in last 90 days: Yes (09/06/2016 UTI)    Readmission Within the Last 30 Days: no previous admission in last 30 days     Anticipated Changes Related to Illness: none    Equipment Needed After Discharge:  (Wound care supplies TBD per physician)    Patient at risk for readmission?: Yes    Discharge Plan:    Screen findings are: Care Manager reviewed the plan of the patient's care with the Multidisciplinary Team. No discharge planning needs identified at this time. Care Manager will continue to manage plan and monitor patient's progress with the team.    Expected Discharge Date: 11/29/16    Patient and/or family were provided with choice of facilities / services that are available and appropriate to meet post hospital care needs?: Other (Comment) (Case Manager will provide choices as appropriate)     Initial Assessment complete?: Yes    Patient has been identified as a Runner, broadcasting/film/video.  Requested that a  timely follow-up appointment be scheduled and documented prior to discharge.

## 2016-11-29 NOTE — Unmapped (Signed)
Acute Pain Service Follow-Up Note- Nerve Catheter      Assessment and Plan    46 y.o. yo s/p right tibial osteotomy and revision TTC nail on POD# 1 with a popliteal nerve catheter for postoperative pain.    Catheter status/changes: Pain well-controlled, will continue current regimen. Patient to be discharged today with pain ball.  Additional pain/sedation medications: Continue current regimen.    Contact the Acute Pain Service at [phone] with questions or concerns.    Diagnosis: leg pain      History    Patient is a 46 y.o. yo s/p right tibial osteotomy and revision TTC nail on 11/28/16 by Dr. Edwena Blow.    Daily Hospital Course    Type of Nerve Catheter: popliteal  Catheter Start Date: 11/28/16  Postop/Catheter Day: POD1, catheter day 2    Analgesia Evaluation:  Pain at rest: 0/10  Pain with activity: 0/10    Infusion: 0.2% ropivacaine  Continuous Infusion Rate: 38ml/hour  PCEA Settings: N/A    Additional opioid and non-opioid analgesics: per primary team    Complications:  Pruritis: No  Urinary Retention: No  Nausea/Vomiting: No  Sedation: No  Respiratory Depression: No    Physical Exam    Vitals:    11/29/16 1200   BP: 164/76   Pulse: 90   Resp: 16   Temp: 36.9 ??C   SpO2: 99%       GEN: Patient sitting up in bed, in no apparent distress  CHEST: Unlabored breathing, supplemental oxygen is not present  SKIN: nerve catheter dressing clean, dry, intact; no erythema at insertion site, no fluctuance or tenderness noted  GENITOURINARY: foley catheter is not present  NEUROLOGIC: patient moves all extremities, alert and oriented to person, place and time; other deficits noted: None

## 2016-11-29 NOTE — Unmapped (Signed)
Problem: Patient Care Overview  Goal: Plan of Care Review  Outcome: Progressing

## 2016-11-30 NOTE — Unmapped (Signed)
Addendum  created 11/30/16 1213 by Sheryn Bison, MD    Sign clinical note

## 2016-12-03 MED ORDER — SULFAMETHOXAZOLE 800 MG-TRIMETHOPRIM 160 MG TABLET
ORAL_TABLET | Freq: Two times a day (BID) | ORAL | 1 refills | 0.00000 days | Status: CP
Start: 2016-12-03 — End: 2016-12-13

## 2016-12-03 NOTE — Unmapped (Signed)
Addendum  created 12/03/16 1451 by Sheryn Bison, MD    Anesthesia Event edited, Sign clinical note

## 2016-12-13 ENCOUNTER — Ambulatory Visit: Admission: RE | Admit: 2016-12-13 | Discharge: 2016-12-13 | Payer: MEDICARE | Admitting: Orthopaedic Surgery

## 2016-12-13 DIAGNOSIS — T847XXD Infection and inflammatory reaction due to other internal orthopedic prosthetic devices, implants and grafts, subsequent encounter: Principal | ICD-10-CM

## 2016-12-13 MED ORDER — OXYCODONE 5 MG TABLET
ORAL_TABLET | Freq: Four times a day (QID) | ORAL | 0 refills | 0 days | Status: CP | PRN
Start: 2016-12-13 — End: 2016-12-17

## 2016-12-13 MED ORDER — SULFAMETHOXAZOLE 800 MG-TRIMETHOPRIM 160 MG TABLET
ORAL_TABLET | Freq: Two times a day (BID) | ORAL | 0 refills | 0.00000 days | Status: CP
Start: 2016-12-13 — End: 2017-01-12

## 2016-12-13 NOTE — Unmapped (Addendum)
Continue daily dressing changes with silvadene, gauze, ace wrap to ankle    Continue Bactrim twice daily    Continue boot when out of the house to protect ankle

## 2016-12-13 NOTE — Unmapped (Signed)
ORTHOPAEDIC POSTOPERATIVE CLINIC NOTE [phone])    ASSESSMENT:  Rachel [name] is a 46 y.o. female status post revision TTC fusion nail R ankle on 11/28/2016  Presently 2 weeks post-op.  Appropriate progress.    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 Knee scooter for ambulation  - Small oxycodone refill provided today  - Follow-up plan: 4 weeks.  - X-rays/plan at next visit: 3 views R ankle.    SUBJECTIVE:  No complaints.  She has been taking Bactrim twice daily as prescribed. She has been compliant with her NWB status, ambulating with her knee scooter. She has been performing daily changing dressings to her ankle with silvadene and gauze, with a gooey area laterally. Pain is controlled; taking oxycodone at nighttime to help with sleep, mostly not needing it during the day. 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 No complaints.     OBJECTIVE:  General Appearance ?? well-nourished and no acute distress   Mood and Affect ?? alert, cooperative and pleasant   Cardiovascular ?? well-perfused distally   Sensation ?? Sensation decreased to light touch in remaining toes   MUSCULOSKELETAL    Lower extremity (right) ?? Incisions appear benign, although with the proximal aspect of her lateral incision slow to heal with fibrinous material in the wound. Sutures in place. No surrounding erythema  ?? Toes WWP  ?? Overall neutral hindfoot alignment maintained       Past Surgical History:   Procedure Laterality Date   ??? CARPAL TUNNEL RELEASE     ??? CATARACT EXTRACTION, BILATERAL     ??? FOOT SURGERY     ??? KIDNEY AND PANCREAS TRANSPLANT 2010     ??? KIDNEY TRANSPLANT 1998     ??? PR ARTHRODESIS,ANKLE,OPEN Right 11/28/2016    Procedure: ARTHRODESIS, ANKLE, OPEN;  Surgeon: Garen Grams, MD;  Location: MAIN OR [facility];  Service: Ortho Trauma   ??? PR DEBRIDEMENT, SKIN, SUB-Q TISSUE,MUSCLE,BONE,=<20 SQ CM Right 02/22/2013    Procedure: DEBRIDEMENT; SKIN, SUBCUTANEOUS TISSUE, MUSCLE, & BONE FOOT;  Surgeon: Alben Deeds, MD;  Location: MAIN OR [facility];  Service: Vascular   ??? PR DEBRIDEMENT, SKIN, SUB-Q TISSUE,MUSCLE,BONE,=<20 SQ CM Right 06/10/2013    Procedure: DEBRIDEMENT; SKIN, SUBCUTANEOUS TISSUE, MUSCLE, & BONE;  Surgeon: Madelaine Bhat, DPM;  Location: ASC OR [facility];  Service: Vascular   ??? PR DEBRIDEMENT, SKIN, SUB-Q TISSUE,MUSCLE,BONE,=<20 SQ CM Right 07/22/2013    Procedure: DEBRIDEMENT; SKIN, SUBCUTANEOUS TISSUE, MUSCLE, & BONE;  Surgeon: Madelaine Bhat, DPM;  Location: ASC OR [facility];  Service: Vascular   ??? PR INSERTION DRUG IMPLANT DEVICE Right 09/03/2016    Procedure: Insertion, Non-Biodegradable Drug Delivery Implant;  Surgeon: Garen Grams, MD;  Location: MAIN OR [facility];  Service: Ortho Trauma   ??? PR OSTEOTOMY TIBIA Right 11/28/2016    Procedure: Osteotomy; Tibia;  Surgeon: Garen Grams, MD;  Location: MAIN OR [facility];  Service: Ortho Trauma   ??? PR PARTIAL REMOVAL OF TIBIA Right 09/03/2016    Procedure: Partial Excision, Bone; Tibia;  Surgeon: Garen Grams, MD;  Location: MAIN [facility];  Service: Ortho Trauma   ??? PR REMOVAL OF ANKLE IMPLANT Right 09/03/2016    Procedure: REMOVAL OF ANKLE IMPLANT;  Surgeon: Garen Grams, MD;  Location: MAIN OR [facility];  Service: Ortho Trauma   ??? SKIN GRAFTS     ??? TOE AMPUTATION      h/o 5th toe. 02/22/13 Great toe Right due to osteo

## 2016-12-17 ENCOUNTER — Ambulatory Visit
Admission: RE | Admit: 2016-12-17 | Discharge: 2016-12-17 | Payer: MEDICARE | Attending: Family Medicine | Admitting: Family Medicine

## 2016-12-17 DIAGNOSIS — M25512 Pain in left shoulder: Principal | ICD-10-CM

## 2016-12-17 DIAGNOSIS — M65351 Trigger finger, right little finger: Secondary | ICD-10-CM

## 2016-12-17 MED ORDER — OXYCODONE 5 MG TABLET
ORAL_TABLET | Freq: Four times a day (QID) | ORAL | 0 refills | 0 days | Status: CP | PRN
Start: 2016-12-17 — End: 2016-12-22

## 2016-12-17 MED ORDER — METHYLPREDNISOLONE 4 MG TABLETS IN A DOSE PACK
PACK | 0 refills | 0 days | Status: SS
Start: 2016-12-17 — End: 2017-03-03

## 2016-12-17 NOTE — Unmapped (Signed)
Procedures  Procedure:  Reviewed indications and risks of trigger finger injection.  Pt expressed understanding and gave consent.Timeout procedure performed.  After sterile prep, injected total of 1/2cc  kenalog (20 mg) and 1/2 cc lidocaine into tendon sheath at level of nodule and A1 pulley R 5th (small) finger.  Pt tolerated procedure well without apparent complication.

## 2016-12-17 NOTE — Unmapped (Signed)
Rotator Cuff: Exercises  Your Care Instructions  Here are some examples of typical rehabilitation exercises for your condition. Start each exercise slowly. Ease off the exercise if you start to have pain.  Your doctor or physical therapist will tell you when you can start these exercises and which ones will work best for you.  How to do the exercises  Pendulum swing    1. Hold on to a table or the back of a chair with your good arm. Then bend forward a little and let your sore arm hang straight down. This exercise does not use the arm muscles. Rather, use your legs and your hips to create movement that makes your arm swing freely.  2. Use the movement from your hips and legs to guide the slightly swinging arm back and forth like a pendulum (or elephant trunk). Then guide it in circles that start small (about the size of a dinner plate). Make the circles a bit larger each day, as your pain allows.  3. Do this exercise for 5 minutes, 5 to 7 times each day.  4. As you have less pain, try bending over a little farther to do this exercise. This will increase the amount of movement at your shoulder.  Posterior stretching exercise    1. Hold the elbow of your injured arm with your other hand.  2. Use your hand to pull your injured arm gently up and across your body. You will feel a gentle stretch across the back of your injured shoulder.  3. Hold for at least 15 to 30 seconds. Then slowly lower your arm.  4. Repeat 2 to 4 times.  Up-the-back stretch    1. Put your hand in your back pocket. Let it rest there to stretch your shoulder.  2. With your other hand, hold your injured arm (palm outward) behind your back by the wrist. Pull your arm up gently to stretch your shoulder.  3. Next, put a towel over your other shoulder. Put the hand of your injured arm behind your back. Now hold the back end of the towel. With the other hand, hold the front end of the towel in front of your body. Pull gently on the front end of the towel. This will bring your hand farther up your back to stretch your shoulder.  Overhead stretch    1. Standing about an arm's length away, grasp onto a solid surface. You could use a countertop, a doorknob, or the back of a sturdy chair.  2. With your knees slightly bent, bend forward with your arms straight. Lower your upper body, and let your shoulders stretch.  3. As your shoulders are able to stretch farther, you may need to take a step or two backward.  4. Hold for at least 15 to 30 seconds. Then stand up and relax. If you had stepped back during your stretch, step forward so you can keep your hands on the solid surface.  5. Repeat 2 to 4 times.  Shoulder flexion (lying down)    1. Lie on your back, holding a wand with both hands. Your palms should face down as you hold the wand.  2. Keeping your elbows straight, slowly raise your arms over your head. Raise them until you feel a stretch in your shoulders, upper back, and chest.  3. Hold for 15 to 30 seconds.  4. Repeat 2 to 4 times.  Shoulder rotation (lying down)    1. Lie on your back. Hold  a wand with both hands with your elbows bent and palms up.  2. Keep your elbows close to your body, and move the wand across your body toward the sore arm.  3. Hold for 8 to 12 seconds.  4. Repeat 2 to 4 times.  Wall climbing (to the side)    1. Stand with your side to a wall so that your fingers can just touch it at an angle about 30 degrees toward the front of your body.  2. Walk the fingers of your injured arm up the wall as high as pain permits. Try not to shrug your shoulder up toward your ear as you move your arm up.  3. Hold that position for a count of at least 15 to 20.  4. Walk your fingers back down to the starting position.  5. Repeat at least 2 to 4 times. Try to reach higher each time.  Wall climbing (to the front)    1. Face a wall, and stand so your fingers can just touch it.  2. Keeping your shoulder down, walk the fingers of your injured arm up the wall as high as pain permits. (Don't shrug your shoulder up toward your ear.)  3. Hold your arm in that position for at least 15 to 30 seconds.  4. Slowly walk your fingers back down to where you started.  5. Repeat at least 2 to 4 times. Try to reach higher each time.  Shoulder blade squeeze    1. Stand with your arms at your sides, and squeeze your shoulder blades together. Do not raise your shoulders up as you squeeze.  2. Hold 6 seconds.  3. Repeat 8 to 12 times.  Scapular exercise: Arm reach    1. Lie flat on your back. This exercise is a very slight motion that starts with your arms raised (elbows straight, arms straight).  2. From this position, reach higher toward the sky or ceiling. Keep your elbows straight. All motion should be from your shoulder blade only.  3. Relax your arms back to where you started.  4. Repeat 8 to 12 times.  Arm raise to the side    1. Slowly raise your injured arm to the side, with your thumb facing up. Raise your arm 60 degrees at the most (shoulder level is 90 degrees).  2. Hold the position for 3 to 5 seconds. Then lower your arm back to your side. If you need to, bring your good arm across your body and place it under the elbow as you lower your injured arm. Use your good arm to keep your injured arm from dropping down too fast.  3. Repeat 8 to 12 times.  4. When you first start out, don't hold any extra weight in your hand. As you get stronger, you may use a 1-pound to 2-pound dumbbell or a small can of food.  Shoulder flexor and extensor exercise    1. Push forward (flex): Stand facing a wall or doorjamb, about 6 inches or less back. Hold your injured arm against your body. Make a closed fist with your thumb on top. Then gently push your hand forward into the wall with about 25% to 50% of your strength. Don't let your body move backward as you push. Hold for about 6 seconds. Relax for a few seconds. Repeat 8 to 12 times.  2. Push backward (extend): Stand with your back flat against a wall. Your upper arm should be against the wall, with  your elbow bent 90 degrees (your hand straight ahead). Push your elbow gently back against the wall with about 25% to 50% of your strength. Don't let your body move forward as you push. Hold for about 6 seconds. Relax for a few seconds. Repeat 8 to 12 times.  Scapular exercise: Wall push-ups    1. Stand facing a wall, about 12 inches to 18 inches away.  2. Place your hands on the wall at shoulder height.  3. Slowly bend your elbows and bring your face to the wall. Keep your back and hips straight.  4. Push back to where you started.  5. Repeat 8 to 12 times.  6. When you can do this exercise against a wall comfortably, you can try it against a counter. You can then slowly progress to the end of a couch, then to a sturdy chair, and finally to the floor.  Scapular exercise: Retraction    1. Put the band around a solid object at about waist level. (A bedpost will work well.) Each hand should hold an end of the band.  2. With your elbows at your sides and bent to 90 degrees, pull the band back. Your shoulder blades should move toward each other. Then move your arms back where you started.  3. Repeat 8 to 12 times.  4. If you have good range of motion in your shoulders, try this exercise with your arms lifted out to the sides. Keep your elbows at a 90-degree angle. Raise the elastic band up to about shoulder level. Pull the band back to move your shoulder blades toward each other. Then move your arms back where you started.  Internal rotator strengthening exercise    1. Start by tying a piece of elastic exercise material to a doorknob. You can use surgical tubing or Thera-Band.  2. Stand or sit with your shoulder relaxed and your elbow bent 90 degrees. Your upper arm should rest comfortably against your side. Squeeze a rolled towel between your elbow and your body for comfort. This will help keep your arm at your side.  3. Hold one end of the elastic band in the hand of the painful arm.  4. Slowly rotate your forearm toward your body until it touches your belly. Slowly move it back to where you started.  5. Keep your elbow and upper arm firmly tucked against the towel roll or at your side.  6. Repeat 8 to 12 times.  External rotator strengthening exercise    1. Start by tying a piece of elastic exercise material to a doorknob. You can use surgical tubing or Thera-Band. (You may also hold one end of the band in each hand.)  2. Stand or sit with your shoulder relaxed and your elbow bent 90 degrees. Your upper arm should rest comfortably against your side. Squeeze a rolled towel between your elbow and your body for comfort. This will help keep your arm at your side.  3. Hold one end of the elastic band with the hand of the painful arm.  4. Start with your forearm across your belly. Slowly rotate the forearm out away from your body. Keep your elbow and upper arm tucked against the towel roll or the side of your body until you begin to feel tightness in your shoulder. Slowly move your arm back to where you started.  5. Repeat 8 to 12 times.  Follow-up care is a key part of your treatment and safety. Be sure to make and  go to all appointments, and call your doctor if you are having problems. It's also a good idea to know your test results and keep a list of the medicines you take.  Where can you learn more?  Go to [facility] at [email or web address]  Select Preferences in the upper right hand corner, then select Health Library under Resources. Enter J005 in the search box to learn more about Rotator Cuff: Exercises.  Current as of: January 25, 2016  Content Version: 11.8  ?? 2006-2018 [employer]. Care instructions adapted under license by [facility]. If you have questions about a medical condition or this instruction, always ask your healthcare professional. [employer] disclaims any warranty or liability for your use of this information.

## 2016-12-17 NOTE — Unmapped (Addendum)
This is a prior for patient - 2013. Current pain and locking of right pinkie finger for 1 month. Improved with CSI in the past at ortho, relief lasted ~66yrs. CSI performed today. If no improvement with CSI, would refer to surgery for trigger finger release.

## 2016-12-17 NOTE — Unmapped (Addendum)
Pt endorses left arm pain and upper left back pain x3 days. Denies trauma/falls. Exam consistent with L rotator cuff tendonitis. Pt responded well to prednisone burst for R rotator cuff tendonitis ~06/2016. Script for oral steroid taper sent. Xray L shoulder ordered as area does appear possibly swollen. 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 She clarified that the only SEs from oxycodone are bad dreams (not confusion/dizziness as indicated in chart); updated her chart to reflect this. HEP provided w/theraband. If no improvement, could also consider CSI L shoulder and PT.

## 2016-12-17 NOTE — Unmapped (Signed)
Assessment & Plan        Time: Greater than 50% of this encounter was spent in direct consultation with the patient in evaluation and discussing the following.  Duration of encounter:39min      Problem List Items Addressed This Visit     Acute pain of left shoulder - Primary     Pt endorses left arm pain and upper left back pain x3 days. Denies trauma/falls. Exam consistent with L rotator cuff tendonitis. Pt responded well to prednisone burst for R rotator cuff tendonitis ~06/2016. Script for oral steroid taper sent. Xray L shoulder ordered as area does appear possibly swollen. 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 She clarified that the only SEs from oxycodone are bad dreams (not confusion/dizziness as indicated in chart); updated her chart to reflect this. HEP provided w/theraband. If no improvement, could also consider CSI L shoulder and PT.         Relevant Medications    oxyCODONE (ROXICODONE) 5 MG immediate release tablet    methylPREDNISolone (MEDROL DOSEPACK) 4 mg tablet    Other Relevant Orders    XR Shoulder 3 Or More Views Left    Trigger little finger of right hand     This is a prior for patient - 2013. Current pain and locking of right pinkie finger for 1 month. Improved with CSI in the past at ortho, relief lasted ~38yrs. CSI performed today. If no improvement with CSI, would refer to surgery for trigger finger release.         Relevant Medications    triamcinolone acetonide (KENALOG-40) injection 20 mg    lidocaine (XYLOCAINE) 20 mg/mL (2 %) injection 0.5 mL        Return if symptoms worsen or fail to improve.      Patient Instructions and After Visit Summary printed out and provided to patient as appropriate. Barriers to goals identified and addressed.  Pertinent handouts given today and reviewed with the patient.  Encouraged keeping regular logs for me to review at next visit as appropriate. No outside resources/referrals needed at this time unless stated otherwise above. The potential risks and benefits associated with any new medications that were prescribed today were discussed in full detail and patient expressed understanding.    Disposition: Barriers to goals identified and addressed. Pertinent handouts given today and reviewed with the patient. Encouraged keeping regular logs for me to review at next visit. No outside resources/referrals needed at this time unless stated otherwise above. The potential risks and benefits associated with any new medications that were prescribed today were discussed and patient expressed understanding. Patient expresses confidence in ability to manage personal health conditions. Patient has no visual, hearing or language needs unless otherwise stated.  Reviewed diagnosis, management options, expected outcomes and reasons for scheduled and emergent follow up.  Questions were adequately answered. For any new medications, MSE discussed and reviewed and patient voiced understanding.  Discussed any new diagnoses and the care plan with patient, and provided care plan in writing to family/pt/caregiver.  Patient expressed verbal understanding and agreement with stated plan.  If the patient's family member or friend was used as an Equities trader, this was done at the request of the patient.      Subjective:     Rachel [name] is a 46 y.o. female who presents for Arm Pain (pt c/o left arm pain. Denies falling and injury to  arm.)            History of Present Illness     HPI     Arm Pain    Additional comments: pt c/o left arm pain. Denies falling and injury to arm.       Last edited by Donneta Romberg, CMA on 12/17/2016  2:06 PM. (History)      Last OV with me was 07/02/2016. Pt was admitted to the hospital with acute cystitis from 4/19-4/20 to place [facility] to administer IV ertapenem. She had finished her course of antibiotics and was asymptomatic at our OV. Neg UA 06/27/2016. For acute pain of right shoulder, this was improved after prednisone burst, and she did not complain of pain. Full range of motion. Pt with hx of kidney transplant, stable, recently seen by transplant nephrology for her annual f/u (07/07/16). 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 For hyperlipidemia, she was continued on Atorvastatin 40mg  qd; continued this and ASA. She was hospitalized 08/25/2016- 09/24/2016. Hospital course: [pt with] recurrent UTI's who presented with fever, urinary frequency and trouble voiding. Strep bacteremia, likely 2/2 right foot/ankle wound. R plantar open foot wound that has grown MRSA, GBS, and reportedly pseudomonas in the past. Reported increased edema of foot for 3-4 days prior to admission. PVL's negative for DVT and R foot XR showing degenerative/Charcot changes, no definitive osteomyelitis. 1/2 blood cultures at OSH showed strep agalactiae. On HD 3, began having increased swelling and pain over the R ankle. MRI R foot showed fluid collection with underlying erosion concerning for inflammatory/infectious fluid collection. She was evaluated by vascular surgery for her plantar wound, recommended outpatient visit to wound care clinic. However, she was then evaluated by orthopedic surgery for her R ankle edema and erythema. Pt adamantly refused BKA, so she was instead taken to the OR for hardware exchange and placement of antibiotic beads. Cultures from ankle I&D as well as OR cultures showed only reactive cells as she had been on Abx for the below UTI prior to sampling. Per ICID, she will remain on vancomycin 750mg  daily and ceftriaxone 2g daily via home infusion until 8/20 (6 week total course). For the Auer term, she will need orthopedic shoes for Charcot joint on discharge. Urinary Tract Infection, resolved. Of note pt has had multiple UTI's in the past 6 months (including E. Coli, Proteus) necessitating IV antibiotics, including an E. Coli UTI a couple weeks prior to the present admission requiring IV ertapenem x 7 days. For the current episode, she initially presented to OSH and was treated with IV antibiotics (initially vanc/zosyn). UCX at OSH speciated Klebsiella pneumonia and she was transitioned to vanc/meropenum, then transitioned to vanc/ceftriaxone.  Prolonged Abx course due to septic joint, course sufficient for UTI completed. AKI on CKD versus worsening of CKD. On initial presentation, Cr 2.5 from baseline 1.5. Renal transplant ultrasound showed slightly increased resistive indices in main renal artery, though other resistive indices stable (mildly elevated), no hydronephrosis. However, her Cr did not significantly change during her admission despite interventions. Pt is followed by nephrology (Dr. Carlene Coria); last visit 11/06/2016. Revision TTC fusion nail R ankle was performed 11/28/2016. She is followed by orthopedic surgery (Dr. Eloise Levels); last visit with them was 12/13/2016. 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 Continue daily dressing changes to lateral ankle incision until wound fully heals. Knee scooter for ambulation. Small oxycodone refill was provided. Health maintenance is UTD.  Left arm pain  She woke up Friday night/Saturday (~3 days ago) with left arm pain. Her entire arm continues to hurt but mainly in shoulder. The pain is a soreness. Concentrated in her upper arm and left upper back. Pain with elevating the arm over her head. Denies numbness/tingling/weakness in LUE. Pain nonexertional. Denies running fevers or feeling sick otherwise. Denies neck pain. Denies falling/trauma. Pt is right handed. Taking a baking class at school which she had to skip today due to pain.    Trigger finger  This is a chronic issue for patient since 2013. Always fingers of the right hand - ring/pinkie finger. Currently, the pinkie finger is painful and locking. Exacerbation for the last month. Improved with CSI in the past 2013 at ortho. Denies trauma.     Review of Systems     History obtained from the patient and chart review     Unless otherwise stated in the HPI:  CONSTITUTIONAL: no f/c/s  HEENT: Eyes: No diplopia or blurred vision. ENT: No earache, sore throat or runny nose.   CARDIOVASCULAR: No pressure, squeezing, strangling, tightness, heaviness or aching about the chest, neck, axilla or epigastrium.   RESPIRATORY: No cough, shortness of breath, PND or orthopnea.   GASTROINTESTINAL: No nausea, vomiting or diarrhea.   GENITOURINARY: No dysuria, frequency or urgency.   MUSCULOSKELETAL: As per HPI. Left arm pain. Right finger pain.    SKIN: No change in skin, hair or nails.   NEUROLOGIC: No paresthesias, fasciculations, seizures or weakness.   PSYCHIATRIC: No disorder of thought or mood.   ENDOCRINE: No heat or cold intolerance, polyuria or polydipsia.   HEMATOLOGICAL: No easy bruising or bleeding.            Review of History     MEDICATIONS:        Current Outpatient Prescriptions on File Prior to Visit   Medication Sig Dispense Refill   ??? albuterol (PROVENTIL HFA;VENTOLIN HFA) 90 mcg/actuation inhaler Inhale 2 puffs every six (6) hours as needed for wheezing or shortness of breath. 1 Inhaler 0   ??? amitriptyline (ELAVIL) 25 MG tablet Take 1 tablet (25 mg total) by mouth three (3) times a day (at 6am, noon and 6pm). 90 tablet 3   ??? aspirin (ADULT LOW DOSE ASPIRIN) 81 MG tablet Take 81 mg by mouth daily.      ??? atorvastatin (LIPITOR) 40 MG tablet TAKE ONE TABLET BY MOUTH DAILY (Patient taking differently: TAKE ONE TABLET BY MOUTH DAILY AT NIGHT) 30 tablet 11   ??? calcium-vitamin D (CALCIUM-VITAMIN D) 500 mg(1,250mg ) -200 unit per tablet Take 500 mg by mouth daily.      ??? cyclobenzaprine (FLEXERIL) 5 MG tablet Take 1-2 tablets (5-10 mg total) by mouth Three (3) times a day as needed for muscle spasms. 60 tablet 0   ??? DUREZOL 0.05 % Drop daily as needed.      ??? furosemide (LASIX) 20 MG tablet Take 1 tablet (20 mg total) by mouth daily as needed. 30 tablet 11   ??? multivitamin (PHARMACIST FAVORITE MULTI-VIT) per tablet Take by mouth. Frequency:QD   Dosage:1   TABLET  Instructions:  Note:Dose: 1 TABLET     ??? mycophenolate (MYFORTIC) 180 MG EC tablet Take 2 tablets (360 mg total) by mouth Two (2) times a day. Z94.0, DAW1, brand medically necessary 180 tablet 5   ??? omeprazole (PRILOSEC) 40 MG capsule TAKE ONE CAPSULE BY MOUTH TWICE A DAY 180 capsule 3   ??? polyethylene glycol (MIRALAX)  17 gram/dose powder Take 17 g by mouth daily. (Patient taking differently: Take 17 g by mouth daily as needed. ) 255 g 3   ??? PROGRAF 1 mg capsule 4mg  in the am and 3mg  in the pm, DAW1, brand medically necessary, tx date 01/31/09, Z94.0, [facility] Transfer 270 capsule 5   ??? promethazine (PHENERGAN) 25 mg/mL injection One mL Every 8 hours as needed for nausea 1 mL 6   ??? sodium bicarbonate 650 mg tablet Take 2 tablets (1,300 mg total) by mouth Two (2) times a day. 120 tablet 11   ??? sulfamethoxazole-trimethoprim (BACTRIM DS) 800-160 mg per tablet Take 1 tablet (160 mg of trimethoprim total) by mouth Two (2) times a day. 60 tablet 0   ??? VIGAMOX 0.5 % ophthalmic solution daily as needed.      ??? [DISCONTINUED] oxyCODONE (ROXICODONE) 5 MG immediate release tablet Take 1 tablet (5 mg total) by mouth every six (6) hours as needed for pain. 20 tablet 0   ??? gabapentin (NEURONTIN) 100 MG capsule Take 1 capsule (100 mg total) by mouth Three (3) times a day. for 10 days Begin after surgery 30 capsule 0     Current Facility-Administered Medications on File Prior to Visit   Medication Dose Route Frequency Provider Last Rate Last Dose   ??? ferumoxytol (FERAHEME) injection 510 mg  510 mg Intravenous Once Brayton Caves, MD            ALLERGIES:      Oxycodone-acetaminophen         PAST MEDICAL HISTORY:     Past Medical History:   Diagnosis Date   ??? Anemia    ??? Chronic kidney disease    ??? CTS (carpal tunnel syndrome)    ??? Diabetes mellitus (CMS-HCC)     history prior to pancreas transplant-a1c 5.2 2016 without DM meds   ??? GERD (gastroesophageal reflux disease)    ??? Hyperlipidemia 01/31/2009   ??? Hypertension    ??? Kidney transplanted    ??? Pancreas transplanted (CMS-HCC)    ??? Pneumonia 2010       PAST SURGICAL HISTORY:     Past Surgical History:   Procedure Laterality Date   ??? CARPAL TUNNEL RELEASE     ??? CATARACT EXTRACTION, BILATERAL     ??? FOOT SURGERY     ??? KIDNEY AND PANCREAS TRANSPLANT 2010     ??? KIDNEY TRANSPLANT 1998     ??? PR ARTHRODESIS,ANKLE,OPEN Right 11/28/2016    Procedure: ARTHRODESIS, ANKLE, OPEN;  Surgeon: Garen Grams, MD;  Location: MAIN OR [facility];  Service: Ortho Trauma   ??? PR DEBRIDEMENT, SKIN, SUB-Q TISSUE,MUSCLE,BONE,=<20 SQ CM Right 02/22/2013    Procedure: DEBRIDEMENT; SKIN, SUBCUTANEOUS TISSUE, MUSCLE, & BONE FOOT;  Surgeon: Alben Deeds, MD;  Location: MAIN OR [facility];  Service: Vascular   ??? PR DEBRIDEMENT, SKIN, SUB-Q TISSUE,MUSCLE,BONE,=<20 SQ CM Right 06/10/2013    Procedure: DEBRIDEMENT; SKIN, SUBCUTANEOUS TISSUE, MUSCLE, & BONE;  Surgeon: Madelaine Bhat, DPM;  Location: ASC OR [facility];  Service: Vascular   ??? PR DEBRIDEMENT, SKIN, SUB-Q TISSUE,MUSCLE,BONE,=<20 SQ CM Right 07/22/2013    Procedure: DEBRIDEMENT; SKIN, SUBCUTANEOUS TISSUE, MUSCLE, & BONE;  Surgeon: Madelaine Bhat, DPM;  Location: ASC OR [facility];  Service: Vascular   ??? PR INSERTION DRUG IMPLANT DEVICE Right 09/03/2016    Procedure: Insertion, Non-Biodegradable Drug Delivery Implant;  Surgeon: Garen Grams, MD;  Location: MAIN OR [facility];  Service: Ortho Trauma   ??? PR OSTEOTOMY TIBIA Right 11/28/2016  Procedure: Osteotomy; Tibia;  Surgeon: Garen Grams, MD;  Location: MAIN OR [facility];  Service: Ortho Trauma   ??? PR PARTIAL REMOVAL OF TIBIA Right 09/03/2016    Procedure: Partial Excision, Bone; Tibia;  Surgeon: Garen Grams, MD;  Location: MAIN OR [facility];  Service: Ortho Trauma   ??? PR REMOVAL OF ANKLE IMPLANT Right 09/03/2016    Procedure: REMOVAL OF ANKLE IMPLANT;  Surgeon: Garen Grams, MD;  Location: MAIN OR [facility];  Service: Ortho Trauma   ??? SKIN GRAFTS     ??? TOE AMPUTATION      h/o 5th toe. 02/22/13 Great toe Right due to osteo       PROBLEM LIST:     Patient Active Problem List    Diagnosis Date Noted   ??? Charcot foot due to diabetes mellitus (CMS-HCC) 03/09/2014     Priority: Medium   ??? Osteomyelitis (CMS-HCC) 07/31/2013     Priority: Medium   ??? 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 Sacroiliitis (CMS-HCC) 04/29/2012     Priority: Medium   ??? 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 History of diabetes as a child 01/09/1991     Priority: Medium   ??? Acute pain of left shoulder 12/17/2016   ??? Closed fracture of ankle with malunion, right 11/20/2016   ??? Sinusitis 08/14/2016   ??? Right hip pain 04/27/2016   ??? Acute cystitis without hematuria 04/10/2016   ??? Acute pain of right shoulder 04/10/2016   ??? Acute viral conjunctivitis of left eye 04/10/2016   ??? Urinary tract infection 04/06/2016   ??? LGSIL on Pap smear of cervix 02/17/2016   ??? Screening for malignant neoplasm of cervix 02/06/2016   ??? Routine physical examination 02/05/2016   ??? Cervical paraspinal muscle spasm 01/11/2016   ??? Pain, dental 12/03/2015   ??? Cough 07/27/2015   ??? Pelvic pain in female 06/08/2015   ??? Muscle spasm 05/18/2015   ??? PAD (peripheral artery disease) (CMS-HCC) 09/03/2014   ??? Cellulitis and abscess of hand 08/09/2014   ??? Anemia in stage 3 chronic kidney disease (CMS-HCC) 05/01/2014   ??? Nausea 04/20/2014   ??? Ulcer of finger (CMS-HCC) 02/12/2014   ??? External hemorrhoid 01/25/2014   ??? Edema 01/01/2014   ??? Pain in limb 01/01/2014   ??? Ulcer of heel and midfoot (CMS-HCC) 09/11/2013   ??? Tension headache 09/07/2013   ??? GERD (gastroesophageal reflux disease) 06/09/2013   ??? Hypertension 06/09/2013   ??? Hypoxia 03/01/2013   ??? Toe osteomyelitis, right (CMS-HCC) 03/01/2013   ??? Hyponatremia 03/01/2013   ??? Left lumbar radiculitis 07/01/2012   ??? Dyskinesia of esophagus 04/26/2011   ??? Trigger little finger of right hand 04/16/2011   ??? Dysphagia 07/21/2010   ??? Gastroparesis 01/31/2009   ??? Hyperlipidemia 01/31/2009   ??? History of kidney transplant 10/13/1998           SOCIAL HISTORY:      reports that she has never smoked. She has never used smokeless tobacco. She reports that she does not drink alcohol or use drugs.   FAMILY HISTORY:     family history includes Arrhythmia in her brother; Breast cancer in her sister; Cancer in her maternal grandmother; Diabetes in her maternal aunt, maternal uncle, and mother; Hypertension in her brother and sister; No Known Problems in her maternal grandfather, paternal aunt, paternal grandfather, paternal grandmother, and paternal uncle.  Objective:    BP 143/76  - Pulse 86  - Temp 36.4 ??C (97.6 ??F) (Oral)  - Resp 18  - Ht 157.5 cm (5' 2.01)  - Wt 70.3 kg (155 lb)  - SpO2 100%  - BMI 28.34 kg/m??  - Body mass index is 28.34 kg/m??.   Physical exam:    General:  Well appearing, well nourished in no distress.   Skin: no obvious rash or  prominent lesions    Cardiovascular: no obvious JVD  Eyes:  conjunctiva clear, sclera non-icteric   Ears/nose/throat: no obvious external deformities  Respiratory: no distress    Neurologic: moves extremities symmetrically, cranial nerves grossly intact   LUE NVI w/1+ radial pulse , brisk cap rf, hand warm/dry, no cyanosis, nl light touch, 2+ DTRs biceps/triceps symmetric, 1+ BR DTR symmetric, 5/5 strength symmetric throughout UEs.  Hematologic: no obvious ecchymosis     Psychiatric:  Oriented X3, intact judgement and insight, normal mood and affect.   Musculoskeletal: using scooter to support left knee/leg    Neck-FAROM, NT, neg L spurlings   L shoulder- possible swelling around joint, no erythema/warmth. AROM limited- 110 deg abduction w/painful arc, 110 deg forward flexion, int rotation to L4 (R to T10), ext rotation 30deg (60 deg R). NT, 5/5 strength all directions. + empty can test, + hawkins/neers tests, neg apprehension test, no pain or click w/axial load and rotation ,neg crossover test   L elbow-NT, FAROM   L wrist/hand-NT, FAROM all joints.    R small finger-no deformity, NT, FAROM, 5/5 strength w/flexion and extension, + active triggering on exam w/AROM    No results found for this or any previous visit (from the past 24 hour(s)).      The documentation recorded by the scribe accurately reflects the service I personally performed and the decisions made by me.    Dr. Rosemarie Ax, MD    I attest that I, Ignacia Bayley, personally documented this note while acting as scribe for Dr. Rosemarie Ax, MD.      Ignacia Bayley, Scribe.  December 17, 2016 2:32 PM

## 2016-12-18 ENCOUNTER — Ambulatory Visit
Admission: RE | Admit: 2016-12-18 | Discharge: 2016-12-18 | Disposition: A | Discharge status: Home / Self Care | Payer: MEDICARE

## 2016-12-18 DIAGNOSIS — M25512 Pain in left shoulder: Principal | ICD-10-CM

## 2016-12-21 NOTE — Unmapped (Signed)
[facility] Refill Coordination Note  Specialty Medication(s): Prograf 1mg  & Myfortic 180mg     Rachel [name], DOB: 1970/09/25  Phone: [phone] (home) , Alternate phone contact: N/A  Phone or address changes today?: No  All above HIPAA information was verified with patient.  Shipping Address: [address]  [place] [address]   Insurance changes? No    Completed refill call assessment today to schedule patient's medication shipment from the [facility] [phone]).      Confirmed the medication and dosage are correct and have not changed: Yes, regimen is correct and unchanged.    Confirmed patient started or stopped the following medications in the past month:  No, there are no changes reported at this time.    Are you tolerating your medication?:  Chancy reports tolerating the medication.    ADHERENCE    (Below is required for Medicare Part B or Transplant patients only - per drug):   How many tablets were dispensed last month:   Myfortic 180 mg   Quantity filled last month: 180   # of tablets left on hand: 8 days  Prograf 1 mg   Quantity filled last month: 270   # of tablets left on hand: 8 days     Did you miss any doses in the past 4 weeks? Yes.  Numa reports missing 1 days of medication therapy in the last 4 weeks.  Amoree reports she missed doses of both medication last saturday due to her not feeling well (Nausea & Vomitting) as the cause of their non-adherance.    FINANCIAL/SHIPPING    Delivery Scheduled: Yes, Expected medication delivery date: 12/25/2016     [name] did not have any additional questions at this time.    Delivery address validated in [facility] scheduling system: Yes, address listed in [facility] is correct.    We will follow up with patient monthly for standard refill processing and delivery.      Thank you,  Tamala Fothergill   [facility] Specialty Technician

## 2016-12-24 MED FILL — PROGRAF/1MG/CAP: PROGRAF/1MG/CAP | 30 days supply | Qty: 270 | Fill #5

## 2016-12-24 MED FILL — MYFORTIC/180MG/TAB: MYFORTIC/180MG/TAB | 30 days supply | Qty: 180 | Fill #5

## 2017-01-01 NOTE — Unmapped (Signed)
I released this to her back on 10/23 in Mychart. You can let her know what I said-Your shoulder xray showed only mild arthritis in the shoulder joint. Let's see how it feels with the steroid taper pack and the home exercises.

## 2017-01-01 NOTE — Unmapped (Signed)
Pt lvm requesting xray results of shoulder. Please advise.

## 2017-01-01 NOTE — Unmapped (Signed)
Was unable to reach pt and was unable to leave voicemail. Will try to contact pt again.

## 2017-01-02 NOTE — Unmapped (Signed)
Spoke w/ pt and pt is requesting referral to be sent to [facility] on [address] or [facility] in [place]. Pt states this is more convenient for her w/ transportation. Pt also scheduled an OV for 12/5 for cortisone shot.

## 2017-01-02 NOTE — Unmapped (Signed)
Spoke w/ pt and relayed results. Pt states she did the steroid taper pack and home exercises but states shoulder is still hurting. She would like to know if there is something else she can do. Please advise.

## 2017-01-02 NOTE — Unmapped (Signed)
Redid referral to [facility] in [place]. cc'ing Janelle.  Janelle-please send my last visit note with referral thx

## 2017-01-02 NOTE — Unmapped (Signed)
Next steps are cortisone injection and PT. I've put in a referral for PT (they will call her to schedule). If she wants to try that 1st that is fine. If she wants to get a cortisone injxn in her shoulder, she can schedule an OV w/me for that.

## 2017-01-03 NOTE — Unmapped (Signed)
Faxed referral and clinical notes to [facility]. Patient will be contacted for scheduling.

## 2017-01-03 NOTE — Unmapped (Signed)
Called and informed pt that Dr. Merilynn Finland has sent a referral to [facility].

## 2017-01-10 ENCOUNTER — Ambulatory Visit
Admission: RE | Admit: 2017-01-10 | Discharge: 2017-01-10 | Disposition: A | Discharge status: Home / Self Care | Payer: MEDICARE

## 2017-01-10 ENCOUNTER — Ambulatory Visit
Admission: RE | Admit: 2017-01-10 | Discharge: 2017-01-10 | Disposition: A | Discharge status: Home / Self Care | Payer: MEDICARE | Admitting: Orthopaedic Surgery

## 2017-01-10 DIAGNOSIS — S82891P Other fracture of right lower leg, subsequent encounter for closed fracture with malunion: Principal | ICD-10-CM

## 2017-01-10 LAB — C-REACTIVE PROTEIN: C reactive protein:MCnc:Pt:Ser/Plas:Qn:: <5

## 2017-01-10 LAB — ERYTHROCYTE SEDIMENTATION RATE: Lab: 66 — ABNORMAL HIGH

## 2017-01-10 NOTE — Unmapped (Addendum)
Orthopedic Trauma Return Note  Attending: Brion Aliment MD    January 10, 2017 9:43 AM    ASSESSMENT::  Rachel [name] is a 46 y.o. female status post revision TTC fusion nail right ankle on 11/28/16 for MRSA osteomyelitis     PLAN:  Overall doing well. We have ordered some lab work today to see where she stands from an infection standpoint and we'll determine antibiotic course hollowing this. She continues to be on Bactrim which we would like her to continue for now. She can also advance to 50% weightbearing with a walker which she has at home. Hopefully this will provide some consolidation across the ankle fusion. We will see her back in around 4 weeks with repeat x-rays. All questions answered.   Follow up in 4 weeks  XR at follow up: XR right ankle weight bearing      SUBJECTIVE:  Chief Complaint: Post op  Overall does not have any complaints. No drainage from the incisions. She has been taking Bactrim twice a day as prescribed. She has been compliant with her nonweightbearing status though did put some weight on it today trying to get out to her car. Her pain is well-controlled. She notes no fevers or chills. No additional complaints. Happy with the result so far.    ROS: Negative for fevers or chest pain      EXAM:   GENERAL: The patient is a well-appearing in no acute distress.  EXTREMITIES:   Incisions are healing well. No drainage  Foot is warm and well perfused.  Wiggles remaining toes  Decreased sensation throughout foot      Test Results:  Imaging: 3 views of the right ankle were ordered and reviewed today and demonstrate TTC fusion with no evidence of hwr failure. Maintained alignment

## 2017-01-14 MED ORDER — MYCOPHENOLATE SODIUM 180 MG TABLET,DELAYED RELEASE
ORAL_TABLET | Freq: Two times a day (BID) | ORAL | 5 refills | 0.00000 days | Status: CP
Start: 2017-01-14 — End: 2017-05-05

## 2017-01-14 MED ORDER — PROGRAF 1 MG CAPSULE
ORAL_CAPSULE | 5 refills | 0.00000 days | Status: SS
Start: 2017-01-14 — End: 2017-03-13

## 2017-01-14 MED ORDER — PROGRAF 1 MG CAPSULE: capsule | 5 refills | 0 days | Status: SS

## 2017-01-14 MED ORDER — MYFORTIC 180 MG TABLET,DELAYED RELEASE
ORAL_TABLET | 7 refills | 0 days
Start: 2017-01-14 — End: 2017-01-14

## 2017-01-14 NOTE — Unmapped (Signed)
iab

## 2017-01-14 NOTE — Unmapped (Signed)
[facility] Refill Coordination Note  Specialty Medication(s): PROGRAF 1MG  AND MYFORTIC 180MG   Additional Medications shipped: NO    Rachel [name], DOB: [birth year]  Phone: [phone] (home) , Alternate phone contact: N/A  Phone or address changes today?: No  All above HIPAA information was verified with patient.  Shipping Address: [address]  [place] [address]   Insurance changes? No    Completed refill call assessment today to schedule patient's medication shipment from the [facility] [phone]).      Confirmed the medication and dosage are correct and have not changed: Yes, regimen is correct and unchanged.    Confirmed patient started or stopped the following medications in the past month:  No, there are no changes reported at this time.    Are you tolerating your medication?:  Rachel [name] reports tolerating the medication.    ADHERENCE    Prograf 1 mg   Quantity filled last month: 270    # of tablets left on hand: 126      Myfortic 180 mg   Quantity filled last month: 180   # of tablets left on hand: 84    Did you miss any doses in the past 4 weeks? Yes.  Rachel [name] reports missing 3 doses of medication therapy in the last 4 weeks.  Rachel [name] reports feeling sick/nauseated as the cause of their non-adherance. Patient reports these were not consecutive doses.    FINANCIAL/SHIPPING    Delivery Scheduled: Yes, Expected medication delivery date: 01-24-17.  However, Rx request for refills was sent to the provider as there are none remaining.     Rachel [name] did not have any additional questions at this time.    Delivery address validated in FSI scheduling system: Yes, address listed in FSI is correct.    We will follow up with patient monthly for standard refill processing and delivery.      Thank you,  Rachel [name]   [facility] Shared [facility] Specialty Pharmacist

## 2017-01-22 NOTE — Unmapped (Signed)
Received plan of care forms from [facility]. Placed in Dr. Merilynn Finland folder for signature.

## 2017-01-23 MED FILL — MYFORTIC/180MG/TAB: MYFORTIC/180MG/TAB | 30 days supply | Qty: 120 | Fill #0

## 2017-01-23 MED FILL — PROGRAF/1MG/CAP: PROGRAF/1MG/CAP | 30 days supply | Qty: 210 | Fill #0

## 2017-01-29 NOTE — Unmapped (Deleted)
Assessment & Plan        Time: Greater than 50% of this encounter was spent in direct consultation with the patient in evaluation and discussing the following.  Duration of encounter: ***           Problem List Items Addressed This Visit     Acute pain of right shoulder - Primary    Acute pain of left shoulder           No Follow-up on file.      Patient Instructions and After Visit Summary printed out and provided to patient as appropriate. Barriers to goals identified and addressed.  Pertinent handouts given today and reviewed with the patient.  Encouraged keeping regular logs for me to review at next visit as appropriate. No outside resources/referrals needed at this time unless stated otherwise above. The potential risks and benefits associated with any new medications that were prescribed today were discussed in full detail and patient expressed understanding.    Disposition: Barriers to goals identified and addressed. Pertinent handouts given today and reviewed with the patient. Encouraged keeping regular logs for me to review at next visit. No outside resources/referrals needed at this time unless stated otherwise above. The potential risks and benefits associated with any new medications that were prescribed today were discussed and patient expressed understanding. Patient expresses confidence in ability to manage personal health conditions. Patient has no visual, hearing or language needs unless otherwise stated.  Reviewed diagnosis, management options, expected outcomes and reasons for scheduled and emergent follow up.  Questions were adequately answered. For any new medications, MSE discussed and reviewed and patient voiced understanding.  Discussed any new diagnoses and the care plan with patient, and provided care plan in writing to family/pt/caregiver.  Patient expressed verbal understanding and agreement with stated plan.  If the patient's family member or friend was used as an Equities trader, this was done at the request of the patient.      Subjective:     Rachel [name] is a 46 y.o. female who presents for No chief complaint on file.            History of Present Illness     Last OV with me on 12/17/2016. Acute pain of L shoulder c/w L rotator cuff tendonitis. Pt responded well to prednisone burst for R rotator cuff tendonitis ~06/2016. Script for oral steroid taper sent. Xray L shoulder ordered as area does appear possibly swollen. 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 She clarified that the only SEs from oxycodone are bad dreams (not confusion/dizziness as indicated in chart); updated her chart to reflect this. HEP provided w/theraband. If no improvement, planned for CSI L shoulder and PT. Trigger little finger of right hand. Improved with CSI in the past at ortho, relief lasted ~80yrs. CSI performed at visit. If no improvement with CSI, would refer to surgery for trigger finger release. XR L shoulder showed No acute fracture or dislocation Mild left acromioclavicular joint osteoarthrosis. I had staff contact pt relaying the results of the L shoulder XR and recommended next steps to be CSI and PT(referral sent to [facility] in [place]). 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 Cont'd Bactrim. Sed rate abnormal. XR R ankle showed Status post interval hindfoot fusion. No evidence of immediate hardware complication. A comminuted, nondisplaced fracture of the  base of the medial distal tibia is more apparent on current examination. Due for retinal eye exam and foot exam.        Review of Systems     History obtained from {source of history:310783} and chart review     Unless otherwise stated in the HPI:  CONSTITUTIONAL: no f/c/s  HEENT: Eyes: No diplopia or blurred vision. ENT: No earache, sore throat or runny nose.   CARDIOVASCULAR: No pressure, squeezing, strangling, tightness, heaviness or aching about the chest, neck, axilla or epigastrium.   RESPIRATORY: No cough, shortness of breath, PND or orthopnea.   GASTROINTESTINAL: No nausea, vomiting or diarrhea.   GENITOURINARY: No dysuria, frequency or urgency.   MUSCULOSKELETAL: As per HPI.   SKIN: No change in skin, hair or nails.   NEUROLOGIC: No paresthesias, fasciculations, seizures or weakness.   PSYCHIATRIC: No disorder of thought or mood.   ENDOCRINE: No heat or cold intolerance, polyuria or polydipsia.   HEMATOLOGICAL: No easy bruising or bleeding.            Review of History     MEDICATIONS:        Current Outpatient Prescriptions on File Prior to Visit   Medication Sig Dispense Refill   ??? albuterol (PROVENTIL HFA;VENTOLIN HFA) 90 mcg/actuation inhaler Inhale 2 puffs every six (6) hours as needed for wheezing or shortness of breath. 1 Inhaler 0   ??? amitriptyline (ELAVIL) 25 MG tablet Take 1 tablet (25 mg total) by mouth three (3) times a day (at 6am, noon and 6pm). 90 tablet 3   ??? aspirin (ADULT LOW DOSE ASPIRIN) 81 MG tablet Take 81 mg by mouth daily.      ??? atorvastatin (LIPITOR) 40 MG tablet TAKE ONE TABLET BY MOUTH DAILY (Patient taking differently: TAKE ONE TABLET BY MOUTH DAILY AT NIGHT) 30 tablet 11   ??? calcium-vitamin D (CALCIUM-VITAMIN D) 500 mg(1,250mg ) -200 unit per tablet Take 500 mg by mouth daily.      ??? cyclobenzaprine (FLEXERIL) 5 MG tablet Take 1-2 tablets (5-10 mg total) by mouth Three (3) times a day as needed for muscle spasms. 60 tablet 0   ??? DUREZOL 0.05 % Drop daily as needed.      ??? furosemide (LASIX) 20 MG tablet Take 1 tablet (20 mg total) by mouth daily as needed. 30 tablet 11   ??? gabapentin (NEURONTIN) 100 MG capsule Take 1 capsule (100 mg total) by mouth Three (3) times a day. for 10 days Begin after surgery 30 capsule 0   ??? methylPREDNISolone (MEDROL DOSEPACK) 4 mg tablet follow package directions 1 Package 0   ??? multivitamin (PHARMACIST FAVORITE MULTI-VIT) per tablet Take by mouth. Frequency:QD   Dosage:1   TABLET  Instructions:  Note:Dose: 1 TABLET     ??? mycophenolate (MYFORTIC) 180 MG EC tablet Take 2 tablets (360 mg total) by mouth Two (2) times a day. Z94.0, DAW1, brand medically necessary 180 tablet 5   ??? omeprazole (PRILOSEC) 40 MG capsule TAKE ONE CAPSULE BY MOUTH TWICE A DAY 180 capsule 3   ??? polyethylene glycol (MIRALAX) 17 gram/dose powder Take 17 g by mouth daily. (Patient taking differently: Take 17 g by mouth daily as needed. ) 255 g 3   ??? PROGRAF 1 mg capsule 4mg  in the am and 3mg  in the pm, DAW1, brand medically necessary, tx date 01/31/09, Z94.0, [facility] Transfer 270 capsule 5   ??? promethazine (PHENERGAN) 25 mg/mL injection One mL Every 8 hours as needed for nausea 1  mL 6   ??? sodium bicarbonate 650 mg tablet Take 2 tablets (1,300 mg total) by mouth Two (2) times a day. 120 tablet 11   ??? VIGAMOX 0.5 % ophthalmic solution daily as needed.        Current Facility-Administered Medications on File Prior to Visit   Medication Dose Route Frequency Provider Last Rate Last Dose   ??? ferumoxytol (FERAHEME) injection 510 mg  510 mg Intravenous Once Brayton Caves, MD       ??? lidocaine (XYLOCAINE) 20 mg/mL (2 %) injection 0.5 mL  0.5 mL Other Once Lucille Passy, MD       ??? triamcinolone acetonide (KENALOG-40) injection 20 mg  20 mg Other Once Lucille Passy, MD            ALLERGIES:      Oxycodone-acetaminophen         PAST MEDICAL HISTORY:     Past Medical History:   Diagnosis Date   ??? Anemia    ??? Chronic kidney disease    ??? CTS (carpal tunnel syndrome)    ??? Diabetes mellitus (CMS-HCC)     history prior to pancreas transplant-a1c 5.2 2016 without DM meds   ??? GERD (gastroesophageal reflux disease)    ??? Hyperlipidemia 01/31/2009   ??? Hypertension    ??? Kidney transplanted    ??? Pancreas transplanted (CMS-HCC)    ??? Pneumonia 2010       PAST SURGICAL HISTORY:     Past Surgical History:   Procedure Laterality Date   ??? CARPAL TUNNEL RELEASE     ??? CATARACT EXTRACTION, BILATERAL     ??? FOOT SURGERY     ??? KIDNEY AND PANCREAS TRANSPLANT 2010     ??? KIDNEY TRANSPLANT 1998     ??? PR ARTHRODESIS,ANKLE,OPEN Right 11/28/2016    Procedure: ARTHRODESIS, ANKLE, OPEN;  Surgeon: Garen Grams, MD;  Location: MAIN OR [facility];  Service: Ortho Trauma   ??? PR DEBRIDEMENT, SKIN, SUB-Q TISSUE,MUSCLE,BONE,=<20 SQ CM Right 02/22/2013    Procedure: DEBRIDEMENT; SKIN, SUBCUTANEOUS TISSUE, MUSCLE, & BONE FOOT;  Surgeon: Alben Deeds, MD;  Location: MAIN OR [facility];  Service: Vascular   ??? PR DEBRIDEMENT, SKIN, SUB-Q TISSUE,MUSCLE,BONE,=<20 SQ CM Right 06/10/2013    Procedure: DEBRIDEMENT; SKIN, SUBCUTANEOUS TISSUE, MUSCLE, & BONE;  Surgeon: Madelaine Bhat, DPM;  Location: ASC OR [facility];  Service: Vascular   ??? PR DEBRIDEMENT, SKIN, SUB-Q TISSUE,MUSCLE,BONE,=<20 SQ CM Right 07/22/2013    Procedure: DEBRIDEMENT; SKIN, SUBCUTANEOUS TISSUE, MUSCLE, & BONE;  Surgeon: Madelaine Bhat, DPM;  Location: ASC OR [facility];  Service: Vascular   ??? PR INSERTION DRUG IMPLANT DEVICE Right 09/03/2016    Procedure: Insertion, Non-Biodegradable Drug Delivery Implant;  Surgeon: Garen Grams, MD;  Location: MAIN OR [facility];  Service: Ortho Trauma   ??? PR OSTEOTOMY TIBIA Right 11/28/2016    Procedure: Osteotomy; Tibia;  Surgeon: Garen Grams, MD;  Location: MAIN OR [facility];  Service: Ortho Trauma   ??? PR PARTIAL REMOVAL OF TIBIA Right 09/03/2016    Procedure: Partial Excision, Bone; Tibia;  Surgeon: Garen Grams, MD;  Location: MAIN OR [facility];  Service: Ortho Trauma   ??? PR REMOVAL OF ANKLE IMPLANT Right 09/03/2016    Procedure: REMOVAL OF ANKLE IMPLANT;  Surgeon: Garen Grams, MD;  Location: MAIN OR [facility];  Service: Ortho Trauma   ??? SKIN GRAFTS     ??? TOE AMPUTATION      h/o 5th toe. 02/22/13 Great toe Right due  to osteo       PROBLEM LIST:     Patient Active Problem List    Diagnosis Date Noted   ??? Acute pain of left shoulder 12/17/2016   ??? Closed fracture of ankle with malunion, right 11/20/2016   ??? Sinusitis 08/14/2016   ??? Right hip pain 04/27/2016   ??? Acute cystitis without hematuria 04/10/2016   ??? Acute pain of right shoulder 04/10/2016   ??? Acute viral conjunctivitis of left eye 04/10/2016   ??? Urinary tract infection 04/06/2016   ??? LGSIL on Pap smear of cervix 02/17/2016   ??? Screening for malignant neoplasm of cervix 02/06/2016   ??? Routine physical examination 02/05/2016   ??? Cervical paraspinal muscle spasm 01/11/2016   ??? Pain, dental 12/03/2015   ??? Cough 07/27/2015   ??? Pelvic pain in female 06/08/2015   ??? Muscle spasm 05/18/2015   ??? PAD (peripheral artery disease) (CMS-HCC) 09/03/2014   ??? Cellulitis and abscess of hand 08/09/2014   ??? Anemia in stage 3 chronic kidney disease (CMS-HCC) 05/01/2014   ??? Nausea 04/20/2014   ??? Charcot foot due to diabetes mellitus (CMS-HCC) 03/09/2014   ??? Ulcer of finger (CMS-HCC) 02/12/2014   ??? External hemorrhoid 01/25/2014   ??? Edema 01/01/2014   ??? Pain in limb 01/01/2014   ??? Ulcer of heel and midfoot (CMS-HCC) 09/11/2013   ??? Tension headache 09/07/2013   ??? Osteomyelitis (CMS-HCC) 07/31/2013   ??? 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 GERD (gastroesophageal reflux disease) 06/09/2013   ??? Hypertension 06/09/2013   ??? Hypoxia 03/01/2013   ??? Toe osteomyelitis, right (CMS-HCC) 03/01/2013   ??? Hyponatremia 03/01/2013   ??? Left lumbar radiculitis 07/01/2012   ??? Sacroiliitis (CMS-HCC) 04/29/2012   ??? Dyskinesia of esophagus 04/26/2011   ??? Trigger little finger of right hand 04/16/2011   ??? Dysphagia 07/21/2010   ??? 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 Gastroparesis 01/31/2009   ??? Hyperlipidemia 01/31/2009   ??? History of kidney transplant 10/13/1998   ??? History of diabetes as a child 01/09/1991           SOCIAL HISTORY:      reports that she has never smoked. She has never used smokeless tobacco. She reports that she does not drink alcohol or use drugs.   FAMILY HISTORY:     family history includes Arrhythmia in her brother; Breast cancer in her sister; Cancer in her maternal grandmother; Diabetes in her maternal aunt, maternal uncle, and mother; Hypertension in her brother and sister; No Known Problems in her maternal grandfather, paternal aunt, paternal grandfather, paternal grandmother, and paternal uncle.       Objective:    There were no vitals taken for this visit. - There is no height or weight on file to calculate BMI.   Physical exam:    ***         No results found for this or any previous visit (from the past 24 hour(s)).     ***attestation

## 2017-02-07 ENCOUNTER — Ambulatory Visit
Admission: RE | Admit: 2017-02-07 | Discharge: 2017-02-07 | Disposition: A | Discharge status: Home / Self Care | Payer: MEDICARE

## 2017-02-07 ENCOUNTER — Ambulatory Visit
Admission: RE | Admit: 2017-02-07 | Discharge: 2017-02-07 | Disposition: A | Discharge status: Home / Self Care | Payer: MEDICARE | Admitting: Orthopaedic Surgery

## 2017-02-07 DIAGNOSIS — S82891P Other fracture of right lower leg, subsequent encounter for closed fracture with malunion: Principal | ICD-10-CM

## 2017-02-07 LAB — ERYTHROCYTE SEDIMENTATION RATE: Lab: 48 — ABNORMAL HIGH

## 2017-02-07 LAB — C-REACTIVE PROTEIN: C reactive protein:MCnc:Pt:Ser/Plas:Qn:: 6.3

## 2017-02-07 NOTE — Unmapped (Addendum)
Orthopedic Trauma Return Note  Attending: Brion Aliment MD    February 07, 2017 1:45 PM    ASSESSMENT::  Rachel [name] is a 46 y.o. 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  She seems as though her infection is resolved as her inflammatory markers continue to trend down.  Her sedimentation rate today was 48 and her C-reactive protein is normal.  Her sedimentation rate is still a bit high but is lower than before.  At this point we think it is probably reasonable for her to discontinue her antibiotics after she is done with her current course.  She may continue to weight-bear as tolerated.  We would like to see her back in around 2 months at which time we can get some repeat x-rays of her right ankle.  She may continue to wear her boot if she needs it but may also come out of the boot if she does not need it.  Follow up in 2 months  XR at follow up: XR RIGHT ANKLE    SUBJECTIVE:  Chief Complaint: Follow up TTC fusion nail right  Rachel [name] returns to clinic today for evaluation of the above.  She was last seen in clinic several weeks ago and has been continuing to take Bactrim.  Her wounds are well-healed.  She continues to weight-bear as tolerated in her boot and her pain is pretty well controlled.  No wound breakdown or evidence of infection.  No fevers or chills.  She is overall pretty happy with the results of her surgical procedure.    ROS: Negative for fevers or chest pain      EXAM:   GENERAL: The patient is a well-appearing in no acute distress.  EXTREMITIES:   Incisions are well-healed.  Her foot is in a good plantigrade position.  No significant swelling.  No calf tenderness.  Gait is well coordinated and overall minimally antalgic.  Foot is warm and well perfused.  Well coordinated.   +TA/GS/EHL  SILT throughout foot      Test Results:  Imaging: 3 views of the right ankle were ordered and reviewed in clinic today and demonstrates sequelae of TTC nail with good alignment.  There appears to be bony formation across the ankle as well as subtalar joints.  No evidence of hardware failure.  Still not fully healed.

## 2017-02-07 NOTE — Unmapped (Signed)
Addended by: Jacklynn Barnacle on: 02/07/2017 01:49 PM     Modules accepted: Level of Service

## 2017-02-12 NOTE — Unmapped (Signed)
[facility] Refill Coordination Note  Specialty Medication(s): Myfortic 180mg  & Prograf 1mg      Rachel [name], DOB: 04-03-1970  Phone: [phone] (home) , Alternate phone contact: N/A  Phone or address changes today?: No  All above HIPAA information was verified with patient.  Shipping Address: [address]  [place] [address]   Insurance changes? No    Completed refill call assessment today to schedule patient's medication shipment from the [facility] [phone]).      Confirmed the medication and dosage are correct and have not changed: Yes, regimen is correct and unchanged.    Confirmed patient started or stopped the following medications in the past month:  No, there are no changes reported at this time.    Are you tolerating your medication?:  Rachel [name] reports tolerating the medication.    ADHERENCE    (Below is required for Medicare Part B or Transplant patients only - per drug):   How many tablets were dispensed last month:     Myfortic 180 mg   Quantity filled last month: 120   # of tablets left on hand: 10 Days    Prograf 1 mg   Quantity filled last month: 210   # of tablets left on hand: 10 Days    Did you miss any doses in the past 4 weeks? No missed doses reported.    FINANCIAL/SHIPPING    Delivery Scheduled: Yes, Expected medication delivery date: 02/21/2017     Rachel [name] did not have any additional questions at this time.    Delivery address validated in [facility]SI scheduling system: Yes, address listed in FSI is correct.    We will follow up with patient monthly for standard refill processing and delivery.      Thank you,  Tamala Fothergill   [facility] Shared [facility] Specialty Technician

## 2017-02-20 MED FILL — MYFORTIC/180MG/TAB: MYFORTIC/180MG/TAB | 30 days supply | Qty: 120 | Fill #1

## 2017-02-20 MED FILL — PROGRAF/1MG/CAP: PROGRAF/1MG/CAP | 30 days supply | Qty: 210 | Fill #1

## 2017-02-28 NOTE — Unmapped (Signed)
Transplant Nephrology Clinic Visit    History of Present Illness    Patient is a 47 y.o. 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 She previously had a living related renal transplant in 1998, which failed in 2008. She presents today for a routine follow up. Her post transplant course with this transplant has been without rejection or recurrent disease. Patient does not have evidence of donor specific antibodies as of 08/02/2015. Her previous baseline creatinine had been around 1.0-1.2, then between 1.2 and 1.5 after her right foot infection.  Most recent baseline creatinine has been between 1.5 and 1.7.    She had been in a boot since January 2016 for her nonhealing right foot wound. She continued with wound care without success. Ultimately she went to see Dr. Neill Loft at [facility] who performed a flap surgery on 09/17/2014. She required another surgery on 10/24/2014 involving pins. She was in a halo cast for a period of time. She required a knee scooter for over 6 months. Remarkably, the flap is healed.    She was admitted from 4/19 through 06/15/2016 for placement of tunneled catheter for IV antibiotics for a multi-drug-resistant UTI. She was treated with 7 days of ertapenem as an outpatient following that. During that hospitalization, wound service and vascular surgery were consulted for her right lower extremity wound. They recommended no surgical intervention at that time and gave dressing supplies and instruction. Patient completed course of IV antibiotics, and power line was removed on 07/04/2016.    She was seen by vascular surgery on 07/05/16 who felt that the plantar aspect of her right foot was healed and felt she could return on an as-needed basis.    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 They recommended surveillance to which she agreed.    She was again admitted to [facility] from 6/30 through 09/10/2016. She presented with fever, urinary frequency and dysuria. She had increased edema of the right foot for 3-4 days prior to admission. PVLs were negative for DVT and right foot x-ray showed degenerative/Charcot changes with no definitive osteomyelitis. One of 2 blood cultures from the outside hospital grew strep agalactiae. MRI of the right foot showed a fluid collection with underlying erosion concerning for an inflammatory/infectious fluid collection. She was seen by vascular surgery for the plantar wound who recommended outpatient visit to wound care clinic. She was then evaluated by orthopedic surgery for her right ankle edema and erythema. Right BKA was recommended, which she refused. She was taken to the operating room for hardware exchange and placement of antibiotic beads. Immunocompromised ID service recommended vancomycin 750 mg daily and ceftriaxone 2 g daily for a 6 week total course ending 10/15/2016. Her urine culture from the outside hospital grew Klebsiella which was felt to be adequately covered by her IV antibiotics. Her creatinine on admission was 2.5 from her most recent baseline of 1.6-1.7. Was down to 1.65 on discharge.    She was seen by immunocompromised infectious disease on 10/05/16 who recommended continuation of IV antibiotics through 8/20 as well as urogynecology visit for recurrent UTIs. Her tunneled catheter was removed on 10/16/2016.        Interval history since last visit on 11/06/16    Following her surgery in July, she had some right ankle instability.  For that reason, she underwent arthrodesis of her right ankle on 11/28/16  without complications. She tolerated the procedure well and was discharged after an overnight stay on 11/29/16. She also began Physical Therapy for her ankle on 11/29/16.    Patient then began physical therapy for chronic shoulder pain on 01/02/17.    Today she presents for a follow up visit. She reports having had a cold for about 3 weeks. She has had a productive cough with green phlegm, a runny nose, and stuffy sinuses.  No fever.  Denies shortness of breath.    She has otherwise been feeling well. She reports that her ankle has been feeling good since her last surgery. She endorses only occasionally needing her lasix. She endorses having no taste for food. Nothing tastes good. She endorses this happening on and off for several years. But she also reports eating enough food and getting adequate nutrition.    Review of Systems    Otherwise on review of systems patient denies fever or chills, chest pain, SOB, PND or orthopnea. Denies N/V/abdominal pain. No dysuria, hematuria or difficulty voiding. Bowel movements normal. Denies rash. All other systems are reviewed and are negative.    Medications    Current Outpatient Prescriptions   Medication Sig Dispense Refill   ??? albuterol (PROVENTIL HFA;VENTOLIN HFA) 90 mcg/actuation inhaler Inhale 2 puffs every six (6) hours as needed for wheezing or shortness of breath. 1 Inhaler 0   ??? amitriptyline (ELAVIL) 25 MG tablet Take 1 tablet (25 mg total) by mouth three (3) times a day (at 6am, noon and 6pm). 90 tablet 3   ??? aspirin (ADULT LOW DOSE ASPIRIN) 81 MG tablet Take 81 mg by mouth daily.      ??? atorvastatin (LIPITOR) 40 MG tablet TAKE ONE TABLET BY MOUTH DAILY (Patient taking differently: TAKE ONE TABLET BY MOUTH DAILY AT NIGHT) 30 tablet 11   ??? azithromycin (ZITHROMAX Z-PAK) 250 MG tablet Take 2 tablets (500 mg) on  Day 1,  followed by 1 tablet (250 mg) once daily on Days 2 through 5. 1 Package 0   ??? calcium-vitamin D (CALCIUM-VITAMIN D) 500 mg(1,250mg ) -200 unit per tablet Take 500 mg by mouth daily.      ??? cyclobenzaprine (FLEXERIL) 5 MG tablet Take 1-2 tablets (5-10 mg total) by mouth Three (3) times a day as needed for muscle spasms. 60 tablet 0   ??? DUREZOL 0.05 % Drop daily as needed.      ??? furosemide (LASIX) 20 MG tablet Take 1 tablet (20 mg total) by mouth daily as needed. 30 tablet 11   ??? gabapentin (NEURONTIN) 100 MG capsule Take 1 capsule (100 mg total) by mouth Three (3) times a day. for 10 days Begin after surgery 30 capsule 0   ??? methylPREDNISolone (MEDROL DOSEPACK) 4 mg tablet follow package directions 1 Package 0   ??? multivitamin (PHARMACIST FAVORITE MULTI-VIT) per tablet Take by mouth. Frequency:QD   Dosage:1   TABLET  Instructions:  Note:Dose: 1 TABLET     ??? mycophenolate (MYFORTIC) 180 MG EC tablet Take 2 tablets (360 mg total) by mouth Two (2) times a day. Z94.0, DAW1, brand medically necessary 180 tablet 5   ??? omeprazole (PRILOSEC) 40 MG capsule TAKE ONE CAPSULE BY MOUTH TWICE A DAY 180 capsule 3   ??? polyethylene glycol (MIRALAX) 17 gram/dose powder Take 17 g by mouth daily. (Patient taking differently: Take 17 g by mouth daily as needed. ) 255 g 3   ??? PROGRAF 1 mg capsule 4mg  in the am and 3mg  in the pm,  DAW1, brand medically necessary, tx date 01/31/09, Z94.0, [facility] Transfer 270 capsule 5   ??? promethazine (PHENERGAN) 25 mg/mL injection One mL Every 8 hours as needed for nausea 1 mL 6   ??? sodium bicarbonate 650 mg tablet Take 2 tablets (1,300 mg total) by mouth Two (2) times a day. 120 tablet 11   ??? VIGAMOX 0.5 % ophthalmic solution daily as needed.        Current Facility-Administered Medications   Medication Dose Route Frequency Provider Last Rate Last Dose   ??? ferumoxytol (FERAHEME) injection 510 mg  510 mg Intravenous Once Brayton Caves, MD       ??? lidocaine (XYLOCAINE) 20 mg/mL (2 %) injection 0.5 mL  0.5 mL Other Once Lucille Passy, MD       ??? triamcinolone acetonide (KENALOG-40) injection 20 mg  20 mg Other Once Lucille Passy, MD           Physical Exam    BP 124/62 (BP Site: L Arm, BP Position: Sitting, BP Cuff Size: Medium)  - Pulse 72  - Temp 35.8 ??C (96.5 ??F) (Temporal)  - Ht 157.5 cm (5' 2)  - Wt 69.9 kg (154 lb 3.2 oz)  - BMI 28.20 kg/m??   General: Patient is a pleasant Rachel [name] in no apparent distress.  Eyes: Sclera anicteric.  ENT: Oropharynx without lesions.   Neck: Supple without LAD/JVD/bruits.  Lungs: Clear to auscultation bilaterally, no wheezes/rales/rhonchi.  Cardiovascular: Regular rate and rhythm without murmurs, rubs or gallops.  Abdomen: Soft, notender/nondistended. Positive bowel sounds. No tenderness over the graft  Extremities: No edema on the left. 1-2+ nonpitting edema on the right. Her right foot is in a hard soled shoe.  Skin: Without rash.  Neurological: Grossly nonfocal.  Psychiatric: Mood and affect appropriate.    Laboratory Results    Recent Results (from the past 170 hour(s))   Phosphorus Level    Collection Time: 03/01/17  7:33 AM   Result Value Ref Range    Phosphorus 4.6 2.9 - 4.7 mg/dL   Magnesium Level    Collection Time: 03/01/17  7:33 AM   Result Value Ref Range    Magnesium 2.3 (H) 1.6 - 2.2 mg/dL   Comprehensive Metabolic Panel    Collection Time: 03/01/17  7:33 AM   Result Value Ref Range    Sodium 140 135 - 145 mmol/L    Potassium 4.4 3.5 - 5.0 mmol/L    Chloride 123 (H) 98 - 107 mmol/L    CO2 10.0 (LL) 22.0 - 30.0 mmol/L    BUN 35 (H) 7 - 21 mg/dL    Creatinine 1.61 (H) 0.60 - 1.00 mg/dL    BUN/Creatinine Ratio 16     EGFR MDRD Non Af Amer 24 (L) >=60 mL/min/1.73m2    EGFR MDRD Af Amer 29 (L) >=60 mL/min/1.40m2    Anion Gap 7 (L) 9 - 15 mmol/L    Glucose 85 65 - 99 mg/dL    Calcium 9.1 8.5 - 09.6 mg/dL    Albumin 3.7 3.5 - 5.0 g/dL    Total Protein 6.7 6.5 - 8.3 g/dL    Total Bilirubin 0.4 0.0 - 1.2 mg/dL    AST 17 14 - 38 U/L    ALT 14 (L) 15 - 48 U/L    Alkaline Phosphatase 137 (H) 38 - 126 U/L   Amylase Level    Collection Time: 03/01/17  7:33 AM   Result Value Ref Range    Amylase 329 (  H) 30 - 110 U/L   Lipase Level    Collection Time: 03/01/17  7:33 AM   Result Value Ref Range    Lipase 1,925 (H) 44 - 232 U/L   Hemoglobin A1c    Collection Time: 03/01/17  7:33 AM   Result Value Ref Range    Hemoglobin A1C 4.9 4.8 - 5.6 %    Estimated Average Glucose 94 mg/dL   CBC w/ Differential Collection Time: 03/01/17  7:33 AM   Result Value Ref Range    WBC 11.3 (H) 4.5 - 11.0 10*9/L    RBC 3.03 (L) 4.00 - 5.20 10*12/L    HGB 9.3 (L) 12.0 - 16.0 g/dL    HCT 09.8 (L) 11.9 - 46.0 %    MCV 96.4 80.0 - 100.0 fL    MCH 30.6 26.0 - 34.0 pg    MCHC 31.8 31.0 - 37.0 g/dL    RDW 14.7 (H) 82.9 - 15.0 %    MPV 8.1 7.0 - 10.0 fL    Platelet 282 150 - 440 10*9/L    Absolute Neutrophils 5.4 2.0 - 7.5 10*9/L    Absolute Lymphocytes 1.8 1.5 - 5.0 10*9/L    Absolute Monocytes 0.5 0.2 - 0.8 10*9/L    Absolute Eosinophils 3.4 (H) 0.0 - 0.4 10*9/L    Absolute Basophils 0.0 0.0 - 0.1 10*9/L    Large Unstained Cells 1 0 - 4 %    Macrocytosis Slight (A) Not Present    Anisocytosis Slight (A) Not Present    Hypochromasia Marked (A) Not Present   Morphology Review    Collection Time: 03/01/17  7:33 AM   Result Value Ref Range    Smear Review Comments See Comment (A) Undefined   Urinalysis    Collection Time: 03/01/17  7:54 AM   Result Value Ref Range    Color, UA Yellow     Clarity, UA Clear     Specific Gravity, UA 1.013 1.003 - 1.030    pH, UA 6.5 5.0 - 9.0    Leukocyte Esterase, UA Negative Negative    Nitrite, UA Negative Negative    Protein, UA Trace (A) Negative    Glucose, UA Negative Negative    Ketones, UA Negative Negative    Urobilinogen, UA 0.2 mg/dL 0.2 mg/dL, 1.0 mg/dL    Bilirubin, UA Negative Negative    Blood, UA Negative Negative    RBC, UA <1 <4 /HPF    WBC, UA 3 0 - 5 /HPF    Squam Epithel, UA 1 0 - 5 /HPF    Bacteria, UA Rare (A) None Seen /HPF   Protein/Creatinine Ratio, Urine    Collection Time: 03/01/17  7:54 AM   Result Value Ref Range    Creat U 69.8 Undefined mg/dL    Protein, Ur 56.2 Undefined mg/dL    Protein/Creatinine Ratio, Urine 0.503 Undefined       Assessment and Plan    1. Status post kidney-pancreas transplant.  Her creatinine today is well above her baseline at 2.2.  Additionally, her pancreas labs are quite abnormal with amylase of 329 and lipase of 1925.  Her liver function tests are unremarkable.  I am unsure what has caused her lab derangements as she appeared well on physical exam.  Prior to today, she had not had any labs since 11/06/2017.  All of these labs returned after she had already left the clinic.  Will contact patient and admit her directly for hydration and evaluation of  elevated amylase and lipase.  Would consider empiric treatment for pancreas rejection, possibly with him to put from the transplant surgery service [facility]).  If creatinine does not improve, may require renal biopsy.    2. Right foot wound. She had surgery on 11/28/16 to reinstall the rod in her ankle for support. The surgical site has healed well and she has had no issues.    3.  Productive cough for 3 weeks.  I prescribed her a Z-Pak while she was in the clinic, would consider treating her for bronchitis while in the hospital.    4. Frequent UTIs. Currently asymptomatic.  Urinalysis today is reassuring.    5. Metabolic acidosis and mild hyperkalemia.  CO2 down to 10 today.  She has not been taking her bicarb supplementation.  Potassium normal at 4.4.    6. Anemia.  She received 2 doses of IV iron back in September.  Her hemoglobin has improved from the high 7s/low 8s up to 9.3 today.    7. Health maintenance. She received a flu shot in fall 2018.    8. Will see patient back in 4 months, or sooner if needed depending on her hospital course.     Scribe's Attestation: Lisbeth Ply, MD obtained and performed the history, physical exam and medical decision making elements that were entered into the chart.  Signed by [place] Ormond Foster, Scribe, on March 01, 2017 8:15 AM.    ----------------------------------------------------------------------------------------------------------------------  March 01, 2017 1:59 PM. Documentation assistance provided by the Scribe. I was present during the time the encounter was recorded. The information recorded by the Scribe was done at my direction and has been reviewed and validated by me.  ----------------------------------------------------------------------------------------------------------------------

## 2017-03-01 ENCOUNTER — Ambulatory Visit
Admit: 2017-03-01 | Discharge: 2017-03-13 | Disposition: A | Discharge status: Home / Self Care | Payer: MEDICAID | Source: Ambulatory Visit | Admitting: Nephrology

## 2017-03-01 ENCOUNTER — Ambulatory Visit
Admit: 2017-03-01 | Discharge: 2017-03-13 | Disposition: A | Discharge status: Home / Self Care | Payer: MEDICAID | Source: Ambulatory Visit | Attending: Nephrology | Admitting: Nephrology

## 2017-03-01 ENCOUNTER — Other Ambulatory Visit
Admit: 2017-03-01 | Discharge: 2017-03-13 | Disposition: A | Discharge status: Home / Self Care | Payer: MEDICAID | Source: Ambulatory Visit | Admitting: Nephrology

## 2017-03-01 DIAGNOSIS — Z79899 Other long term (current) drug therapy: Secondary | ICD-10-CM

## 2017-03-01 DIAGNOSIS — T8611 Kidney transplant rejection: Principal | ICD-10-CM

## 2017-03-01 DIAGNOSIS — Z9483 Pancreas transplant status: Secondary | ICD-10-CM

## 2017-03-01 DIAGNOSIS — Z940 Kidney transplant status: Principal | ICD-10-CM

## 2017-03-01 DIAGNOSIS — D899 Disorder involving the immune mechanism, unspecified: Secondary | ICD-10-CM

## 2017-03-01 DIAGNOSIS — N1831 Chronic kidney disease, stage 3a: Secondary | ICD-10-CM | POA: Diagnosis present

## 2017-03-01 LAB — CBC W/ AUTO DIFF
BASOPHILS ABSOLUTE COUNT: 0 10*9/L (ref 0.0–0.1)
BASOPHILS ABSOLUTE COUNT: 0.1 10*9/L (ref 0.0–0.1)
EOSINOPHILS ABSOLUTE COUNT: 3.4 10*9/L — ABNORMAL HIGH (ref 0.0–0.4)
EOSINOPHILS ABSOLUTE COUNT: 4.3 10*9/L — ABNORMAL HIGH (ref 0.0–0.4)
HEMATOCRIT: 29.2 % — ABNORMAL LOW (ref 36.0–46.0)
HEMATOCRIT: 28.9 % — ABNORMAL LOW (ref 36.0–46.0)
HEMOGLOBIN: 9.3 g/dL — ABNORMAL LOW (ref 12.0–16.0)
LARGE UNSTAINED CELLS: 1 % (ref 0–4)
LYMPHOCYTES ABSOLUTE COUNT: 2 10*9/L (ref 1.5–5.0)
MEAN CORPUSCULAR HEMOGLOBIN CONC: 31.8 g/dL (ref 31.0–37.0)
MEAN CORPUSCULAR HEMOGLOBIN CONC: 31.3 g/dL (ref 31.0–37.0)
MEAN CORPUSCULAR HEMOGLOBIN: 30.6 pg (ref 26.0–34.0)
MEAN CORPUSCULAR HEMOGLOBIN: 30.5 pg (ref 26.0–34.0)
MEAN CORPUSCULAR VOLUME: 96.4 fL (ref 80.0–100.0)
MEAN CORPUSCULAR VOLUME: 97.4 fL (ref 80.0–100.0)
MEAN PLATELET VOLUME: 8.1 fL (ref 7.0–10.0)
MONOCYTES ABSOLUTE COUNT: 0.5 10*9/L (ref 0.2–0.8)
NEUTROPHILS ABSOLUTE COUNT: 5.4 10*9/L (ref 2.0–7.5)
NEUTROPHILS ABSOLUTE COUNT: 5.8 10*9/L (ref 2.0–7.5)
PLATELET COUNT: 276 10*9/L (ref 150–440)
RED BLOOD CELL COUNT: 3.03 10*12/L — ABNORMAL LOW (ref 4.00–5.20)
RED BLOOD CELL COUNT: 2.96 10*12/L — ABNORMAL LOW (ref 4.00–5.20)
RED CELL DISTRIBUTION WIDTH: 16.3 % — ABNORMAL HIGH (ref 12.0–15.0)
WBC ADJUSTED: 11.3 10*9/L — ABNORMAL HIGH (ref 4.5–11.0)
WBC ADJUSTED: 12.9 10*9/L — ABNORMAL HIGH (ref 4.5–11.0)

## 2017-03-01 LAB — URINALYSIS
BILIRUBIN UA: NEGATIVE
BLOOD UA: NEGATIVE
GLUCOSE UA: NEGATIVE
KETONES UA: NEGATIVE
LEUKOCYTE ESTERASE UA: NEGATIVE
NITRITE UA: NEGATIVE
PH UA: 6.5 (ref 5.0–9.0)
RBC UA: <1 /HPF (ref <4)
SPECIFIC GRAVITY UA: 1.013 (ref 1.003–1.030)
UROBILINOGEN UA: 0.2
WBC UA: 3 /HPF (ref 0–5)

## 2017-03-01 LAB — AMYLASE
AMYLASE: 329 U/L — ABNORMAL HIGH (ref 30–110)
Chemistry studies:Cmplx:-:^Patient:Set:: 326 — ABNORMAL HIGH
Chemistry studies:Cmplx:-:^Patient:Set:: 329 — ABNORMAL HIGH

## 2017-03-01 LAB — COMPREHENSIVE METABOLIC PANEL
ALBUMIN: 3.7 g/dL (ref 3.5–5.0)
ALBUMIN: 3.6 g/dL (ref 3.5–5.0)
ALKALINE PHOSPHATASE: 137 U/L — ABNORMAL HIGH (ref 38–126)
ALKALINE PHOSPHATASE: 133 U/L — ABNORMAL HIGH (ref 38–126)
ALT (SGPT): 14 U/L — ABNORMAL LOW (ref 15–48)
ALT (SGPT): 21 U/L (ref 15–48)
ANION GAP: 7 mmol/L — ABNORMAL LOW (ref 9–15)
ANION GAP: 9 mmol/L (ref 9–15)
AST (SGOT): 15 U/L (ref 14–38)
BILIRUBIN TOTAL: 0.4 mg/dL (ref 0.0–1.2)
BLOOD UREA NITROGEN: 34 mg/dL — ABNORMAL HIGH (ref 7–21)
BUN / CREAT RATIO: 16
BUN / CREAT RATIO: 16
CALCIUM: 9.1 mg/dL (ref 8.5–10.2)
CALCIUM: 9.2 mg/dL (ref 8.5–10.2)
CHLORIDE: 123 mmol/L — ABNORMAL HIGH (ref 98–107)
CHLORIDE: 124 mmol/L — ABNORMAL HIGH (ref 98–107)
CO2: 10 mmol/L — CL (ref 22.0–30.0)
CO2: 11 mmol/L — CL (ref 22.0–30.0)
CREATININE: 2.22 mg/dL — ABNORMAL HIGH (ref 0.60–1.00)
CREATININE: 2.1 mg/dL — ABNORMAL HIGH (ref 0.60–1.00)
EGFR MDRD AF AMER: 29 mL/min/{1.73_m2} — ABNORMAL LOW (ref >=60)
EGFR MDRD AF AMER: 31 mL/min/{1.73_m2} — ABNORMAL LOW (ref >=60)
EGFR MDRD NON AF AMER: 24 mL/min/{1.73_m2} — ABNORMAL LOW (ref >=60)
EGFR MDRD NON AF AMER: 25 mL/min/{1.73_m2} — ABNORMAL LOW (ref >=60)
GLUCOSE RANDOM: 85 mg/dL (ref 65–99)
GLUCOSE RANDOM: 82 mg/dL (ref 65–179)
POTASSIUM: 4.4 mmol/L (ref 3.5–5.0)
POTASSIUM: 4.5 mmol/L (ref 3.5–5.0)
PROTEIN TOTAL: 6.7 g/dL (ref 6.5–8.3)
PROTEIN TOTAL: 6.7 g/dL (ref 6.5–8.3)
SODIUM: 140 mmol/L (ref 135–145)

## 2017-03-01 LAB — GAMMA GLUTAMYL TRANSFERASE: Gamma glutamyl transferase:CCnc:Pt:Ser/Plas:Qn:: 14

## 2017-03-01 LAB — TACROLIMUS, TROUGH: Lab: 4.3

## 2017-03-01 LAB — HEMOGLOBIN A1C
ESTIMATED AVERAGE GLUCOSE: 94 mg/dL
HEMOGLOBIN A1C: 4.9 % (ref 4.8–5.6)
Hemoglobin A1c/Hemoglobin.total:MFr:Pt:Bld:Qn:: 4.9

## 2017-03-01 LAB — MEAN PLATELET VOLUME: Lab: 8

## 2017-03-01 LAB — SPECIFIC GRAVITY UA: Lab: 1.013

## 2017-03-01 LAB — ANION GAP: Anion gap 3:SCnc:Pt:Ser/Plas:Qn:: 7 — ABNORMAL LOW

## 2017-03-01 LAB — CREATININE, URINE: Lab: 69.8

## 2017-03-01 LAB — CMV VIRAL LD: Lab: NOT DETECTED

## 2017-03-01 LAB — EGFR MDRD NON AF AMER
Glomerular filtration rate/1.73 sq M.predicted.non black:ArVRat:Pt:Ser/Plas/Bld:Qn:Creatinine-based formula (MDRD): 25 — ABNORMAL LOW

## 2017-03-01 LAB — PHOSPHORUS: Phosphate:MCnc:Pt:Ser/Plas:Qn:: 4.6

## 2017-03-01 LAB — CMV DNA, QUANTITATIVE, PCR: CMV VIRAL LD: NOT DETECTED

## 2017-03-01 LAB — WBC ADJUSTED: Lab: 11.3 — ABNORMAL HIGH

## 2017-03-01 LAB — LIPASE
LIPASE: 1925 U/L — ABNORMAL HIGH (ref 44–232)
Triacylglycerol lipase:CCnc:Pt:Ser/Plas:Qn:: 1923 — ABNORMAL HIGH
Triacylglycerol lipase:CCnc:Pt:Ser/Plas:Qn:: 1925 — ABNORMAL HIGH

## 2017-03-01 LAB — MAGNESIUM: Magnesium:MCnc:Pt:Ser/Plas:Qn:: 2.3 — ABNORMAL HIGH

## 2017-03-01 LAB — SMEAR REVIEW

## 2017-03-01 MED ORDER — AZITHROMYCIN 250 MG TABLET
PACK | 0 refills | 0 days | Status: CP
Start: 2017-03-01 — End: 2017-03-06

## 2017-03-01 NOTE — Unmapped (Signed)
Cough and cold symptoms for last 3 weeks.

## 2017-03-01 NOTE — Unmapped (Signed)
Pt was told if she did not get a call about a room being ready she should come to the ER to begin treatment.  She agreed with the plan.

## 2017-03-02 DIAGNOSIS — T8611 Kidney transplant rejection: Principal | ICD-10-CM

## 2017-03-02 LAB — CBC W/ AUTO DIFF
BASOPHILS ABSOLUTE COUNT: 0 10*9/L (ref 0.0–0.1)
EOSINOPHILS ABSOLUTE COUNT: 3.7 10*9/L — ABNORMAL HIGH (ref 0.0–0.4)
HEMATOCRIT: 27.6 % — ABNORMAL LOW (ref 36.0–46.0)
HEMOGLOBIN: 8.6 g/dL — ABNORMAL LOW (ref 12.0–16.0)
LARGE UNSTAINED CELLS: 2 % (ref 0–4)
LYMPHOCYTES ABSOLUTE COUNT: 1.8 10*9/L (ref 1.5–5.0)
MEAN CORPUSCULAR HEMOGLOBIN CONC: 31.2 g/dL (ref 31.0–37.0)
MEAN CORPUSCULAR VOLUME: 96.3 fL (ref 80.0–100.0)
MEAN PLATELET VOLUME: 8.3 fL (ref 7.0–10.0)
NEUTROPHILS ABSOLUTE COUNT: 4.4 10*9/L (ref 2.0–7.5)
PLATELET COUNT: 244 10*9/L (ref 150–440)
RED BLOOD CELL COUNT: 2.86 10*12/L — ABNORMAL LOW (ref 4.00–5.20)

## 2017-03-02 LAB — COMPREHENSIVE METABOLIC PANEL
ALBUMIN: 3 g/dL — ABNORMAL LOW (ref 3.5–5.0)
ALKALINE PHOSPHATASE: 112 U/L (ref 38–126)
ALT (SGPT): 14 U/L — ABNORMAL LOW (ref 15–48)
ANION GAP: 6 mmol/L — ABNORMAL LOW (ref 9–15)
AST (SGOT): 14 U/L (ref 14–38)
BLOOD UREA NITROGEN: 32 mg/dL — ABNORMAL HIGH (ref 7–21)
BUN / CREAT RATIO: 17
CALCIUM: 8.6 mg/dL (ref 8.5–10.2)
CO2: 12 mmol/L — ABNORMAL LOW (ref 22.0–30.0)
CREATININE: 1.92 mg/dL — ABNORMAL HIGH (ref 0.60–1.00)
EGFR MDRD AF AMER: 34 mL/min/{1.73_m2} — ABNORMAL LOW (ref >=60)
EGFR MDRD NON AF AMER: 28 mL/min/{1.73_m2} — ABNORMAL LOW (ref >=60)
POTASSIUM: 4.5 mmol/L (ref 3.5–5.0)
PROTEIN TOTAL: 5.8 g/dL — ABNORMAL LOW (ref 6.5–8.3)
SODIUM: 143 mmol/L (ref 135–145)

## 2017-03-02 LAB — ESTIMATED AVERAGE GLUCOSE: Estimated average glucose:MCnc:Pt:Bld:Qn:Estimated from glycated hemoglobin: 91

## 2017-03-02 LAB — AMYLASE: Chemistry studies:Cmplx:-:^Patient:Set:: 289 — ABNORMAL HIGH

## 2017-03-02 LAB — ALT (SGPT): Alanine aminotransferase:CCnc:Pt:Ser/Plas:Qn:: 14 — ABNORMAL LOW

## 2017-03-02 LAB — HEMOGLOBIN A1C
ESTIMATED AVERAGE GLUCOSE: 91 mg/dL
HEMOGLOBIN A1C: 4.8 % (ref 4.8–5.6)

## 2017-03-02 LAB — MEAN CORPUSCULAR HEMOGLOBIN: Lab: 30.1

## 2017-03-02 LAB — CYCLOSPORINE (FPIA) BLOOD: Lab: <10

## 2017-03-02 LAB — PHOSPHORUS: Phosphate:MCnc:Pt:Ser/Plas:Qn:: 4.5

## 2017-03-02 LAB — MAGNESIUM: Magnesium:MCnc:Pt:Ser/Plas:Qn:: 2.2

## 2017-03-02 LAB — TACROLIMUS, TROUGH: Lab: 3.7

## 2017-03-02 LAB — C-REACTIVE PROTEIN: C reactive protein:MCnc:Pt:Ser/Plas:Qn:: 14.6 — ABNORMAL HIGH

## 2017-03-02 LAB — LIPASE: Triacylglycerol lipase:CCnc:Pt:Ser/Plas:Qn:: 1864 — ABNORMAL HIGH

## 2017-03-02 NOTE — Unmapped (Signed)
Medicine Daily Progress Note  Interval Events/Subjective:  No acute events overnight, Patient states that she is doing well. She still mentions a minor cough but no other issues. Including no diarrhea, constipation, abdominal pain, or SOB.     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 *      Rachel [name] is a 47 y.o. female s/p deceased donor renal and pancreas transplant in 2010, history of UTI, history of chronic right foot wound who presented to [facility] from transplant nephrology clinic due to elevated amylase and lipase, concerning for pancreatic rejection.  ??  Elevated Amylase/Lipase - Concern for pancrease rejection: Amylase 329 and Lipase 1925 in clinic on 1/4.  Patient with no abdominal pain, no nausea/vomiting, no distention, no worsening constipation, no hematuria, no elevated blood sugars, no fevers.  Abdomen soft and non-tender.  A1C yesterday was 4.9.    Creatinine is also elevated. Slight leukocytosis on CBC.    -- Follow Amylase/Lipase-- Downtrending  -- C-peptide: pending  -- Consult to Transplant Surgery: Pending   -- Pancreatic U/S: Pending  -- Chest/Abd/Pelv CT scan w/o contrast: Pending   -- Solumedrol 500mg  x 3 days  -- HLA Ab's: pending  ??  AKI: More recently, Creatinine Baseline has been ~1.5-1.7. Creatinine above baseline in clinic today at 2.2.  Reports normal urine output, no hematuria, no dysuria.  UA with trace protein.    -- NS at 100 ml/hr x 10 hours  -- Monitor creatinine, avoid nephrotoxins  -- Transplant Renal Ultrasound: pending read  -- Bicarb 1950mg  TID  ??  S/p Kidney-Pancreas Transplant: s/p deceased donor kidney and pancreas transplant on 01/31/2009, secondary to Type 1 Diabetes mellitus.  No prior history of rejection or recurrent disease.  Creatinine is elevated above baseline, Amylase/Lipase also newly elevated.    -- Continue home Myfortic 360 mg BID  -- Continue home Tacrolimus 4 mg in the AM and 3 mg qhs  ??  Cough: Reports almost three weeks of a cough with sinus congestion and post-nasal drip.  Denies any fevers or chills at home.  Non-toxic appearing, lungs clear to auscultation.    -- CXR: Lungs clear  -- Rapid Flu-negative. RVP: Pending  -- Azithromycin 500 mg x 1, then 250 mg x 4 days  -- CT Chest: Pending  ??  Metabolic Acidosis: Bicarb more recently has been 13.  11 On admission.  Had not been taking home bicarbonate.  -- Sodium Bicarbonate 1950 mg TID as above  ??  Anemia: Received IV Iron in September 2018 with improvement in Hemoglobin.  Hemoglobin stable and at baseline  ??  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  Wounds have now all healed.  Patient follows with orthopedic surgery as an outpatient.  No increased swelling beyond baseline, no pain, no erythema.      Daily Checklist:  Diet: NPO and Regular Diet (NPO for U/S)  DVT PPx: Heparin 5000mg  q8h   GI PPx:  PPI  Code Status: Full Code  Dispo: Admit to [facility]  ___________________________________________________________________    Labs/Studies:  Labs and Studies from the last 24hrs per EMR and Reviewed    Objective:  Temp:  [36 ??C-37.4 ??C] 37.4 ??C  Heart Rate:  [77] 77  Resp:  [16] 16  BP: (134-145)/(47-67) 134/47  SpO2:  [99 %-100 %] 99 %, BP 134/47  - Pulse 77  - Temp 37.4 ??C (Oral)  - Resp 16  - Ht 157.5 cm (5' 2)  - Wt 69.9 kg (154 lb)  - SpO2 99%  - BMI 28.17 kg/m??     General Appearance: Well appearing. In no acute distress.  HEENT: Head is atraumatic and normocephalic. Sclera anicteric without injection. Oropharyngeal membranes are moist with no erythema or exudate.  Neck: Supple.  Lungs: Normal work of breathing. Clear to auscultation in anterior fields. No wheezes or crackles.  Heart: Regular rate and rhythm. No murmurs, rubs, or gallops.  Abdomen: Soft, nontender, nondistended.  Extremities: No clubbing, cyanosis, Swelling Right > Left lower extremity (Chronic issue)    Helyn App, MD  PGY-1 Resident

## 2017-03-02 NOTE — Unmapped (Signed)
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 *      Rachel [name] is a 47 y.o. female s/p deceased donor renal and pancreas transplant in 2010, history of UTI, history of chronic right foot wound who presented to [facility] from transplant nephrology clinic due to elevated amylase and lipase, concerning for pancreatic rejection.    Elevated Amylase/Lipase - Concern for pancrease rejection: Amylase 329 and Lipase 1925 in clinic on 1/4.  Patient with no abdominal pain, no nausea/vomiting, no distention, no worsening constipation, no hematuria, no elevated blood sugars, no fevers.  Abdomen soft and non-tender.  A1C yesterday was 4.9.  UA with trace protein.  Tac level 4.3.  Only reported symptom is she has felt generally more tired for about the past month.  Creatinine is also elevated.  Slight leukocytosis on CBC.    -- Follow Amylase/Lipase  -- C-peptide pending  -- Consult to Transplant Surgery, discussed patient with them overnight, they requested pancreas ultrasound to r/o splenic artery thrombosis.  -- Discussed pancreas ultrasound with radiologist and [place] tech, who both felt that this exam would be very difficult this far out from the patient's transplant.  Radiology recommended trying a limited RUQ ultrasound to eval for thrombosis.  Want to avoid contrast right now given AKI in transplanted kidney, so did not pursue CT overnight.  -- Discuss in the morning need for biopsy v. Empiric treatment for rejection    AKI: More recently, Creatinine Baseline has been ~1.5-1.7. Creatinine above baseline in clinic today at 2.2.  Reports normal urine output, no hematuria, no dysuria.  UA with trace protein.    -- 500 cc IVF bolus  -- NS at 100 ml/hr x 10 hours  -- Monitor creatinine, avoid nephrotoxins  -- Transplant Renal Ultrasound    S/p Kidney-Pancreas Transplant: s/p deceased donor kidney and pancreas transplant on 01/31/2009, secondary to Type 1 Diabetes mellitus.  No prior history of rejection or recurrent disease.  Creatinine is elevated above baseline, Amylase/Lipase also newly elevated.    -- Addressing AKI and Concern for Pancreas Rejection   -- Renal US and Pancreas US  -- Continue home Myfortic 360 mg BID  -- Continue home Tacrolimus 4 mg in the AM and 3 mg qhs  -- Will follow Tac level in the morning given elevated creatinine    Cough: Reports almost three weeks of a cough with sinus congestion and post-nasal drip.  Denies any fevers or chills at home.  Non-toxic appearing, lungs clear to auscultation.    -- CXR  -- Rapid Flu/RSV  -- Respiratory Viral Panel  -- Azithromycin 500 mg x 1, then 250 mg x 4 days    Metabolic Acidosis: Bicarb more recently has been 13.  11 On admission.  Had not been taking home bicarbonate.  -- Sodium Bicarbonate 1300 mg TID    Anemia: Received IV Iron in September 2018 with improvement in Hemoglobin.  Hemoglobin stable and at baseline    Hx of Multi-Drug Resistant UTI:  Required IV Ertapenem and hospitalization in April 2018 for UTI.  No symptoms of UTI and Urinalysis without evidence of infection.    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  Wounds have now all healed.  Patient follows with orthopedic surgery as an outpatient.  No increased swelling beyond baseline, no pain, no erythema.      FEN/GI/Proph:  -- 500 cc IVF, 100 ml/hr x 10 hours, replace lytes PRN, Regular Diet  -- Home Protonix  -- Heparin [place]    Code Status: Full Code ___________________________________________________________________    Chief Complaint:  Elevated Amylase/Lipase    HPI:    Rachel [name] is a 47 y.o. female s/p deceased donor renal and pancreas transplant in 2010, history of UTI, history of chronic right foot wound who presented to [facility] from transplant nephrology clinic due to elevated amylase and lipase, concerning for pancreatic rejection. Patient underwent deceased donor simultaneous kidney and pancreas transplant on 01/31/2009 secondary to type 1 diabetes mellitus.  Prior to that had a living donor renal transplant in 1998, which failed in 2008.  She was seen in the transplant nephrology clinic on the day of admission for routine follow up.  She has not had any past history of rejection.  Most recent creatinine baseline has been between 1.5 to 1.7.  [name] denies any abdominal pain, no nausea or vomiting, no abdominal distention.  She has some constipation at baseline, but it is stable and not worsened.  No hematuria, no change in urine.  No fevers or chills.  She does report a cold for 3 weeks with productive cough, runny nose, congestion.  She has not had any fevers/chills.  She has had no chest pain, no dyspnea.      In terms of her other past medical conditions.  Since January 2016 she had a nonhealing right foot wound.  She had a flap Surgery performed 09/17/2014 and another surgery 09/17/2014, and then again 10/24/2014. She was admitted to [facility] from 6/30 - 09/10/2016 due to charcot changes of foot, no definitive osteomyelitis.  Taken to OR for hardware exchange and antibiotic bead placement.  She was on Vancomycin and Ceftriaxone for a total of 6 weeks, which ended 10/15/2016.  Some ankle instability and had arthrodesis of right ankle on 11/28/2016 without complications.  Her wound is now healed.  She follows with Orthopedics as an outpatient.  She has not had any increased swelling or pain of her right ankle joint.  She also with a history of multi-drug-resistant UTI and was admitted from 4/19 to 06/15/2016.  She was treated with 7 days of ertapenem.  She has had no dysuria, no frequency, no hematuria, no other signs of UTI.    Allergies:  Oxycodone-acetaminophen    Medications:   Prior to Admission medications    Medication Dose, Route, Frequency   albuterol (PROVENTIL HFA;VENTOLIN HFA) 90 mcg/actuation inhaler 2 puffs, Inhalation, Every 6 hours PRN   amitriptyline (ELAVIL) 25 MG tablet 25 mg, Oral, 3 times a day   aspirin (ADULT LOW DOSE ASPIRIN) 81 MG tablet 81 mg   atorvastatin (LIPITOR) 40 MG tablet TAKE ONE TABLET BY MOUTH DAILY  Patient taking differently: TAKE ONE TABLET BY MOUTH DAILY AT NIGHT   azithromycin (ZITHROMAX Z-PAK) 250 MG tablet Take 2 tablets (500 mg) on  Day 1,  followed by 1 tablet (250 mg) once daily on Days 2 through 5.  calcium-vitamin D (CALCIUM-VITAMIN D) 500 mg(1,250mg ) -200 unit per tablet 500 mg   cyclobenzaprine (FLEXERIL) 5 MG tablet 5-10 mg, Oral, 3 times a day PRN   DUREZOL 0.05 % Drop Daily PRN   furosemide (LASIX) 20 MG tablet 20 mg, Oral, Daily PRN   gabapentin (NEURONTIN) 100 MG capsule 100 mg, Oral, 3 times a day (standard), Begin after surgery   methylPREDNISolone (MEDROL DOSEPACK) 4 mg tablet follow package directions   multivitamin (PHARMACIST FAVORITE MULTI-VIT) per tablet Take by mouth. Frequency:QD   Dosage:1   TABLET  Instructions:  Note:Dose: 1 TABLET   mycophenolate (MYFORTIC) 180 MG EC tablet 360 mg, Oral, 2 times a day (standard), Z94.0, DAW1, brand medically necessary   omeprazole (PRILOSEC) 40 MG capsule TAKE ONE CAPSULE BY MOUTH TWICE A DAY   polyethylene glycol (MIRALAX) 17 gram/dose powder 17 g, Oral, Daily (standard)  Patient taking differently: Take 17 g by mouth daily as needed.    PROGRAF 1 mg capsule 4mg  in the am and 3mg  in the pm, DAW1, brand medically necessary, tx date 01/31/09, Z94.0, [facility]   promethazine (PHENERGAN) 25 mg/mL injection One mL Every 8 hours as needed for nausea sodium bicarbonate 650 mg tablet 1,300 mg, Oral, 2 times a day (standard)   VIGAMOX 0.5 % ophthalmic solution Daily PRN       Medical History:  Past Medical History:   Diagnosis Date   ??? Anemia    ??? Chronic kidney disease    ??? CTS (carpal tunnel syndrome)    ??? Diabetes mellitus (CMS-HCC)     history prior to pancreas transplant-a1c 5.2 2016 without DM meds   ??? GERD (gastroesophageal reflux disease)    ??? Hyperlipidemia 01/31/2009   ??? Hypertension    ??? Kidney transplanted    ??? Pancreas transplanted (CMS-HCC)    ??? Pneumonia 2010       Surgical History:  Past Surgical History:   Procedure Laterality Date   ??? CARPAL TUNNEL RELEASE     ??? CATARACT EXTRACTION, BILATERAL     ??? FOOT SURGERY     ??? KIDNEY AND PANCREAS TRANSPLANT 2010     ??? KIDNEY TRANSPLANT 1998     ??? PR ARTHRODESIS,ANKLE,OPEN Right 11/28/2016    Procedure: ARTHRODESIS, ANKLE, OPEN;  Surgeon: Garen Grams, MD;  Location: MAIN OR [facility];  Service: Ortho Trauma   ??? PR DEBRIDEMENT, SKIN, SUB-Q TISSUE,MUSCLE,BONE,=<20 SQ CM Right 02/22/2013    Procedure: DEBRIDEMENT; SKIN, SUBCUTANEOUS TISSUE, MUSCLE, & BONE FOOT;  Surgeon: Alben Deeds, MD;  Location: MAIN OR [facility];  Service: Vascular   ??? PR DEBRIDEMENT, SKIN, SUB-Q TISSUE,MUSCLE,BONE,=<20 SQ CM Right 06/10/2013    Procedure: DEBRIDEMENT; SKIN, SUBCUTANEOUS TISSUE, MUSCLE, & BONE;  Surgeon: Madelaine Bhat, DPM;  Location: ASC OR [facility];  Service: Vascular   ??? PR DEBRIDEMENT, SKIN, SUB-Q TISSUE,MUSCLE,BONE,=<20 SQ CM Right 07/22/2013    Procedure: DEBRIDEMENT; SKIN, SUBCUTANEOUS TISSUE, MUSCLE, & BONE;  Surgeon: Madelaine Bhat, DPM;  Location: ASC OR [facility];  Service: Vascular   ??? PR INSERTION DRUG IMPLANT DEVICE Right 09/03/2016    Procedure: Insertion, Non-Biodegradable Drug Delivery Implant;  Surgeon: Garen Grams, MD;  Location: MAIN OR [facility];  Service: Ortho Trauma   ??? PR OSTEOTOMY TIBIA Right 11/28/2016    Procedure: Osteotomy; Tibia;  Surgeon: Garen Grams, MD;  Location: MAIN OR [facility];  Service: Ortho Trauma   ??? PR PARTIAL REMOVAL OF TIBIA Right 09/03/2016    Procedure: Partial Excision, Bone; Tibia;  Surgeon: Molly Maduro  Olegario Shearer, MD;  Location: MAIN OR [place];  Service: Ortho Trauma   ??? PR REMOVAL OF ANKLE IMPLANT Right 09/03/2016    Procedure: REMOVAL OF ANKLE IMPLANT;  Surgeon: Garen Grams, MD;  Location: MAIN OR [facility];  Service: Ortho Trauma   ??? SKIN GRAFTS     ??? TOE AMPUTATION      h/o 5th toe. 02/22/13 Great toe Right due to osteo       Social History:  Social History     Social History   ??? Marital status: Single     Spouse name: N/A   ??? Number of children: N/A   ??? Years of education: N/A     Occupational History   ??? Not on file.     Social History Main Topics   ??? Smoking status: Never Smoker   ??? Smokeless tobacco: Never Used   ??? Alcohol use No   ??? Drug use: No   ??? Sexual activity: No     Other Topics Concern   ??? Do You Use Sunscreen? Yes   ??? Tanning Bed Use? No   ??? Are You Easily Burned? No   ??? Excessive Sun Exposure? No   ??? Blistering Sunburns? No     Social History Narrative   ??? No narrative on file       Family History:  Family History   Problem Relation Age of Onset   ??? Diabetes Mother    ??? Breast cancer Sister         Paternal 1/2 sister   ??? Hypertension Sister    ??? Hypertension Brother    ??? Arrhythmia Brother    ??? Diabetes Maternal Aunt    ??? Diabetes Maternal Uncle    ??? Cancer Maternal Grandmother    ??? No Known Problems Paternal Aunt    ??? No Known Problems Paternal Uncle    ??? No Known Problems Maternal Grandfather    ??? No Known Problems Paternal Grandmother    ??? No Known Problems Paternal Grandfather    ??? Clotting disorder Neg Hx    ??? Anesthesia problems Neg Hx    ??? Broken bones Neg Hx    ??? Collagen disease Neg Hx    ??? Dislocations Neg Hx    ??? Fibromyalgia Neg Hx    ??? Gout Neg Hx    ??? Hemophilia Neg Hx    ??? Osteoporosis Neg Hx    ??? Rheumatologic disease Neg Hx    ??? Scoliosis Neg Hx    ??? Severe sprains Neg Hx    ??? Sickle cell anemia Neg Hx    ??? Spinal Compression Fracture Neg Hx    ??? Alcohol abuse Neg Hx    ??? Depression Neg Hx    ??? Drug abuse Neg Hx    ??? Mental illness Neg Hx    ??? Stroke Neg Hx        Review of Systems:  10 systems reviewed and are negative unless otherwise mentioned in HPI    Labs/Studies:  Labs and Studies from the last 24hrs per EMR and Reviewed    Physical Exam:  Temp:  [35.8 ??C-36 ??C] 36 ??C  Heart Rate:  [72-77] 77  Resp:  [16] 16  BP: (124-145)/(62-67) 145/67  SpO2:  [100 %] 100 %    GEN: NAD, sitting in bed, well-appearing, pleasant  EYES: EOMI, conjunctivae clear  ENT: MMM  CV: RRR, no murmurs appreciated  PULM: CTAB, normal work of breathing, on  room air  ABD: soft, non-tender, non-distended, + BS  EXT: R ankle larger than Left ankle.  Right ankle with limited mobility.  No pitting edema.  No erythema.  NEURO: No focal deficits  PSYCH: A+Ox3, appropriate  GU: No CVA tenderness  MSK: No spinal tenderness

## 2017-03-02 NOTE — Unmapped (Signed)
Problem: Patient Care Overview  Goal: Plan of Care Review  Outcome: Progressing      Comments: Pt alert and oriented. Upon arrival to the floor Pt was going off the floor to chest x-ray and then for renal US. Denies pain. Hourly rounds noted. Will continue to monitor.

## 2017-03-02 NOTE — Unmapped (Signed)
Problem: Patient Care Overview  Goal: Plan of Care Review  Outcome: Progressing  Patient has remained free of falls and injuries so far this shift. Patient is oriented and cooperative with her plan of care. She has a flat affect. She denied pain and nausea, endorsed a lower than normal appetite. All kidney/pancreas transplant meds given as ordered. Vital signs stable. Continuing to monitor.  Goal: Individualization and Mutuality  Outcome: Progressing    Goal: Discharge Needs Assessment  Outcome: Progressing    Goal: Interprofessional Rounds/Family Conf  Outcome: Progressing      Problem: Fall Risk (Adult)  Goal: Identify Related Risk Factors and Signs and Symptoms  Related risk factors and signs and symptoms are identified upon initiation of Human Response Clinical Practice Guideline (CPG).   Outcome: Progressing    Goal: Absence of Fall  Patient will demonstrate the desired outcomes by discharge/transition of care.   Outcome: Progressing      Problem: Kidney Transplant (Adult)  Goal: Signs and Symptoms of Listed Potential Problems Will be Absent, Minimized or Managed (Kidney Transplant)  Signs and symptoms of listed potential problems will be absent, minimized or managed by discharge/transition of care (reference Kidney Transplant (Adult) CPG).   Outcome: Progressing    Goal: Anesthesia/Sedation Recovery  Outcome: Progressing      Problem: Self-Care Deficit (Adult,Obstetrics,Pediatric)  Goal: Identify Related Risk Factors and Signs and Symptoms  Related risk factors and signs and symptoms are identified upon initiation of Human Response Clinical Practice Guideline (CPG).   Outcome: Progressing    Goal: Improved Ability to Perform BADL and IADL  Patient will demonstrate the desired outcomes by discharge/transition of care.   Outcome: Progressing

## 2017-03-02 NOTE — Unmapped (Signed)
TransplantSurgery Consult Note    Requesting Attending Physician:  Clayborne Artist, MD  Service Requesting Consult:  Nephrology (MDB)  Service Providing Consult: Transplant Surgery  Consulting Attending: Allyn Kenner, MD      History of Present Illness:     Reason for Consultation: Evaluate for Pancreas rejection    Rachel [name] is 47 y.o. female versus she then underwent with history of live donor kidney transplant in 1998, which failed in 2008.  She then underwent a deceased donor simultaneous kidney and pancreas transplant on 01/31/2009 secondary to T1DM.  She has been followed by the transplant effort nephrology clinic.  She had a clinic visit today and had lab values concerning for an elevation in her creatinine of 2.2. (baseline 1.5-1.7) and elevated amylase (329) and lipase (1925).  She was then admitted to the hospital for IV hydration, and further evaluation of elevated creatinine, amylase, and lipase.  She denies any fever, chills, chest pain, nausea, or shortness of breath.  She reports her urine output has been fine.    Past Medical History:   Past Medical History:   Diagnosis Date   ??? Anemia    ??? Chronic kidney disease    ??? CTS (carpal tunnel syndrome)    ??? Diabetes mellitus (CMS-HCC)     history prior to pancreas transplant-a1c 5.2 2016 without DM meds   ??? GERD (gastroesophageal reflux disease)    ??? Hyperlipidemia 01/31/2009   ??? Hypertension    ??? Kidney transplanted    ??? Pancreas transplanted (CMS-HCC)    ??? Pneumonia 2010       Medications:  Reviewed in EPIC    Allergies:  Allergies   Allergen Reactions   ??? Oxycodone-Acetaminophen Other (See Comments)      crazy dreams       Past Surgical History:  Past Surgical History:   Procedure Laterality Date   ??? CARPAL TUNNEL RELEASE     ??? CATARACT EXTRACTION, BILATERAL     ??? FOOT SURGERY     ??? KIDNEY AND PANCREAS TRANSPLANT 2010     ??? KIDNEY TRANSPLANT 1998     ??? PR ARTHRODESIS,ANKLE,OPEN Right 11/28/2016    Procedure: ARTHRODESIS, ANKLE, OPEN; Surgeon: Garen Grams, MD;  Location: MAIN OR [facility];  Service: Ortho Trauma   ??? PR DEBRIDEMENT, SKIN, SUB-Q TISSUE,MUSCLE,BONE,=<20 SQ CM Right 02/22/2013    Procedure: DEBRIDEMENT; SKIN, SUBCUTANEOUS TISSUE, MUSCLE, & BONE FOOT;  Surgeon: Alben Deeds, MD;  Location: MAIN OR [facility];  Service: Vascular   ??? PR DEBRIDEMENT, SKIN, SUB-Q TISSUE,MUSCLE,BONE,=<20 SQ CM Right 06/10/2013    Procedure: DEBRIDEMENT; SKIN, SUBCUTANEOUS TISSUE, MUSCLE, & BONE;  Surgeon: Madelaine Bhat, DPM;  Location: ASC OR [facility];  Service: Vascular   ??? PR DEBRIDEMENT, SKIN, SUB-Q TISSUE,MUSCLE,BONE,=<20 SQ CM Right 07/22/2013    Procedure: DEBRIDEMENT; SKIN, SUBCUTANEOUS TISSUE, MUSCLE, & BONE;  Surgeon: Madelaine Bhat, DPM;  Location: ASC OR [facility];  Service: Vascular   ??? PR INSERTION DRUG IMPLANT DEVICE Right 09/03/2016    Procedure: Insertion, Non-Biodegradable Drug Delivery Implant;  Surgeon: Garen Grams, MD;  Location: MAIN OR [facility];  Service: Ortho Trauma   ??? PR OSTEOTOMY TIBIA Right 11/28/2016    Procedure: Osteotomy; Tibia;  Surgeon: Garen Grams, MD;  Location: MAIN OR [facility];  Service: Ortho Trauma   ??? PR PARTIAL REMOVAL OF TIBIA Right 09/03/2016    Procedure: Partial Excision, Bone; Tibia;  Surgeon: Garen Grams, MD;  Location: MAIN OR [facility];  Service: Ortho Trauma   ???  PR REMOVAL OF ANKLE IMPLANT Right 09/03/2016    Procedure: REMOVAL OF ANKLE IMPLANT;  Surgeon: Garen Grams, MD;  Location: MAIN OR [facility];  Service: Ortho Trauma   ??? SKIN GRAFTS     ??? TOE AMPUTATION      h/o 5th toe. 02/22/13 Great toe Right due to osteo       Family History:  Reviewed in EPIC    Social History:   Social History   Substance Use Topics   ??? Smoking status: Never Smoker   ??? Smokeless tobacco: Never Used   ??? Alcohol use No     Review of Systems  10 systems were reviewed and are negative except as noted specifically in the HPI.    Objective  Vitals:   Temp:  [35.8 ??C (96.5 ??F)-36 ??C (96.8 ??F)] 36 ??C (96.8 ??F)  Heart Rate:  [72-77] 77  Resp:  [16] 16  BP: (124-145)/(62-67) 145/67  SpO2:  [100 %] 100 %  BMI (Calculated):  [28.2] 28.2      Intake/Output last 24 hours:  No intake or output data in the 24 hours ending 03/01/17 2313      Physical Exam:   CONSTITUTIONAL: No acute distress. Well-appearing consistent with stated age.   EYES: Anicteric. Extraocular movements are grossly intact.   EAR, NOSE, MOUTH AND THROAT: Mucous membranes moist.Trachea midline.   ABDOMEN/GASTROINTESTINAL: Soft,  nondistended without palpable organomegaly or mass. Well-healed mid-line surgical incision scar. Palpable kidney transplant on left side of abdomen  SKIN: Warm. Well perfused without cyanosis or edema.   NEUROLOGIC: Alert and oriented x 3.    Pertinent Diagnostic Tests:  Creatinine elevated to 2.1, amylase 326, lipase 1923.    Imaging:  No results found.    Assessment/Recommendations:  Rachel [name] is a 47 y.o. female with a history of failed living kidney donor transplant 01-Sep-1997 and deceased donor kidney and pancreas transplant on 01/31/2017 was admitted to the hospital with elevated creatinine, amylase, and lipase concerning for rejection.     -Recommend pancreatic transplant ultrasound (US Pancrease Transplant) to evaluate for splenic vein thrombosis.  -Recommend obtaining Hg A1c and C-peptide to evaluate pancrease function  -We will continue to follow the patient.    Patient discussed with Dr. Norma Fredrickson who is in agreement with this plan.

## 2017-03-03 LAB — CBC W/ AUTO DIFF
BASOPHILS ABSOLUTE COUNT: 0 10*9/L (ref 0.0–0.1)
EOSINOPHILS ABSOLUTE COUNT: 0.1 10*9/L (ref 0.0–0.4)
HEMATOCRIT: 29.4 % — ABNORMAL LOW (ref 36.0–46.0)
HEMOGLOBIN: 9 g/dL — ABNORMAL LOW (ref 12.0–16.0)
LARGE UNSTAINED CELLS: 1 % (ref 0–4)
LYMPHOCYTES ABSOLUTE COUNT: 1.8 10*9/L (ref 1.5–5.0)
MEAN CORPUSCULAR HEMOGLOBIN: 29.4 pg (ref 26.0–34.0)
MEAN CORPUSCULAR VOLUME: 96.1 fL (ref 80.0–100.0)
MEAN PLATELET VOLUME: 7.9 fL (ref 7.0–10.0)
MONOCYTES ABSOLUTE COUNT: 0.4 10*9/L (ref 0.2–0.8)
PLATELET COUNT: 241 10*9/L (ref 150–440)
RED BLOOD CELL COUNT: 3.06 10*12/L — ABNORMAL LOW (ref 4.00–5.20)
RED CELL DISTRIBUTION WIDTH: 16.2 % — ABNORMAL HIGH (ref 12.0–15.0)
WBC ADJUSTED: 14.1 10*9/L — ABNORMAL HIGH (ref 4.5–11.0)

## 2017-03-03 LAB — COMPREHENSIVE METABOLIC PANEL
ALBUMIN: 3.2 g/dL — ABNORMAL LOW (ref 3.5–5.0)
ALKALINE PHOSPHATASE: 122 U/L (ref 38–126)
ALT (SGPT): 16 U/L (ref 15–48)
ANION GAP: 7 mmol/L — ABNORMAL LOW (ref 9–15)
BILIRUBIN TOTAL: 0.3 mg/dL (ref 0.0–1.2)
BLOOD UREA NITROGEN: 37 mg/dL — ABNORMAL HIGH (ref 7–21)
BUN / CREAT RATIO: 18
CALCIUM: 9 mg/dL (ref 8.5–10.2)
CHLORIDE: 121 mmol/L — ABNORMAL HIGH (ref 98–107)
CREATININE: 2.11 mg/dL — ABNORMAL HIGH (ref 0.60–1.00)
EGFR MDRD AF AMER: 30 mL/min/{1.73_m2} — ABNORMAL LOW (ref >=60)
EGFR MDRD NON AF AMER: 25 mL/min/{1.73_m2} — ABNORMAL LOW (ref >=60)
GLUCOSE RANDOM: 198 mg/dL — ABNORMAL HIGH (ref 65–179)
POTASSIUM: 4.2 mmol/L (ref 3.5–5.0)
PROTEIN TOTAL: 6.3 g/dL — ABNORMAL LOW (ref 6.5–8.3)
SODIUM: 141 mmol/L (ref 135–145)

## 2017-03-03 LAB — MONOCYTES ABSOLUTE COUNT: Lab: 0.4

## 2017-03-03 LAB — PHOSPHORUS: Phosphate:MCnc:Pt:Ser/Plas:Qn:: 3.4

## 2017-03-03 LAB — EGFR MDRD AF AMER
Glomerular filtration rate/1.73 sq M.predicted.black:ArVRat:Pt:Ser/Plas/Bld:Qn:Creatinine-based formula (MDRD): 30 — ABNORMAL LOW

## 2017-03-03 LAB — AMYLASE: Chemistry studies:Cmplx:-:^Patient:Set:: 171 — ABNORMAL HIGH

## 2017-03-03 LAB — MAGNESIUM: Magnesium:MCnc:Pt:Ser/Plas:Qn:: 2.2

## 2017-03-03 LAB — LIPASE: Triacylglycerol lipase:CCnc:Pt:Ser/Plas:Qn:: 718 — ABNORMAL HIGH

## 2017-03-03 NOTE — Unmapped (Signed)
Problem: Patient Care Overview  Goal: Plan of Care Review  Outcome: Progressing    Goal: Individualization and Mutuality  Outcome: Progressing    Goal: Discharge Needs Assessment  Outcome: Progressing    Goal: Interprofessional Rounds/Family Conf  Outcome: Progressing      Problem: Fall Risk (Adult)  Goal: Identify Related Risk Factors and Signs and Symptoms  Related risk factors and signs and symptoms are identified upon initiation of Human Response Clinical Practice Guideline (CPG).   Outcome: Progressing    Goal: Absence of Fall  Patient will demonstrate the desired outcomes by discharge/transition of care.   Outcome: Progressing      Problem: Kidney Transplant (Adult)  Goal: Signs and Symptoms of Listed Potential Problems Will be Absent, Minimized or Managed (Kidney Transplant)  Signs and symptoms of listed potential problems will be absent, minimized or managed by discharge/transition of care (reference Kidney Transplant (Adult) CPG).   Outcome: Progressing    Goal: Anesthesia/Sedation Recovery  Outcome: Progressing      Problem: Self-Care Deficit (Adult,Obstetrics,Pediatric)  Goal: Identify Related Risk Factors and Signs and Symptoms  Related risk factors and signs and symptoms are identified upon initiation of Human Response Clinical Practice Guideline (CPG).   Outcome: Progressing    Goal: Improved Ability to Perform BADL and IADL  Patient will demonstrate the desired outcomes by discharge/transition of care.   Outcome: Progressing      Comments: VSS. Pt went down to CT early in shift. Denies pain and is tolerating a regular diet. Up to bathroom independently. Continues to be on contact/droplet isolation. Coping well with hospitalization.

## 2017-03-03 NOTE — Unmapped (Signed)
TRANSPLANT SURGERY CONSULT NOTES    Assessment/Plan:   84 F w/ h/o kidney/pancreas txp in 2010 for T1DM presents w/ elevated lipase/amylase/creatinine and concern for rejection versus vascular obstruction.  Pancreatic and renal transplant duplex ultrasonography demonstrates patent vasculature with normal parenchyma.  No indication for surgical revision or management of transplanted organs at this time.  We would recommend mgmt of possible rejection per txp nephrology.  We will continue to follow.    Subjective:   NAEON.  No pain/nausea or other symptoms.    Objective:     Vital signs in last 24 hours:  Temp:  [36.6 ??C-37.4 ??C] 37.2 ??C  Heart Rate:  [81-92] 88  Resp:  [16-18] 18  BP: (154-169)/(59-75) 154/75  MAP (mmHg):  [91] 91  SpO2:  [95 %-100 %] 99 %    Intake/Output last 24 hours:  I/O last 3 completed shifts:  In: 400 [P.O.:400]  Out: -     Physical Exam:  Gen - no acute distress, lying in bed  Neuro - no focal deficits  Pulm - normal work of breathing on room air  Abdomen - nontender, nondistended, soft  Extremities - warm and well perfused      Data Review:    I have reviewed the labs and studies from the last 24 hours.      Imaging: Radiology studies were personally reviewed

## 2017-03-03 NOTE — Unmapped (Signed)
Care Management  Initial Transition Planning Assessment    Per H&P: Rachel [name] is a 47 y.o. 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              General  Care Manager assessed the patient by : Medical record review, Telephone conversation with family, Discussion with Clinical Care team  Orientation Level: Oriented X4  Who provides care at home?: N/A    Contact/Decision Maker:    Contact Details  [name] (mother) [phone]  Greig Castilla (sister) [phone]    Advance Directive (Medical Treatment)  Does patient have an advance directive covering medical treatment?: Patient does not have advance directive covering medical treatment.  Reason patient does not have an advance directive covering medical treatment:: Patient does not wish to complete one at this time  Surrogate decision maker appointed:: No  Reason there is not a surrogate decision maker appointed:: Patient does not wish to appoint a surrogate decision maker at this time  Information provided on advance directive:: No  Patient requests assistance:: No         Medical Provider(s): Arlyss Gandy, MD  Reason for Admission: Admitting Diagnosis:  s/p transplant, pancreas rejection  Past Medical History:   has a past medical history of Anemia; Chronic kidney disease; CTS (carpal tunnel syndrome); Diabetes mellitus (CMS-HCC); GERD (gastroesophageal reflux disease); Hyperlipidemia (01/31/2009); Hypertension; Kidney transplanted; Pancreas transplanted (CMS-HCC); and Pneumonia (2010).  Past Surgical History:   has a past surgical history that includes Cataract extraction, bilateral; KIDNEY TRANSPLANT 1998; KIDNEY AND PANCREAS TRANSPLANT 2010; Toe amputation; SKIN GRAFTS; pr debridement, skin, sub-q tissue,muscle,bone,=<20 sq cm (Right, 02/22/2013); pr debridement, skin, sub-q tissue,muscle,bone,=<20 sq cm (Right, 06/10/2013); pr debridement, skin, sub-q tissue,muscle,bone,=<20 sq cm (Right, 07/22/2013); Carpal tunnel release; Foot surgery; pr removal of ankle implant (Right, 09/03/2016); pr insertion drug implant device (Right, 09/03/2016); pr partial removal of tibia (Right, 09/03/2016); pr arthrodesis,ankle,open (Right, 11/28/2016); and pr osteotomy tibia (Right, 11/28/2016).   Previous admit date: 08/25/2016    Primary Insurance- Payor: MEDICARE / Plan: MEDICARE PART A AND PART B / Product Type: *No Product type* /   Secondary Insurance ??? Medicaid  Prescription Coverage ??? Yes  Preferred Pharmacy - [facility] - [place], [place] - [address]  [id number] - [place], [place] - [address] AT [facility]  [facility] - [place], [place] - [address]    Transportation home: Private vehicle  Level of function prior to admission: Independent         Patient Information:    Lives with: Family members, Parent    Type of Residence: Private residence  Location/Detail: [address] [place]  [address]    Support Systems: Family Members, Friends/Neighbors    Responsibilities/Dependents at home?: No    Home Care services in place prior to admission?: No      Outpatient/Community Resources in place prior to admission: Clinic  Agency detail (Name/Phone #): [facility]- Dr. Lucienne Minks True    Equipment Currently Used at Home: walker, rolling       Currently receiving outpatient dialysis?: No       Financial Information:     Patient source of income: Medicaid    Need for financial assistance?: No       Discharge Needs Assessment:  Concerns to be Addressed: denies needs/concerns at this time, adjustment to diagnosis/illness    Clinical Risk Factors: New Diagnosis, Multiple Diagnoses (Chronic)    Barriers to taking medications: No    Prior overnight hospital stay or ED visit in last 90 days: Yes    Readmission Within the Last 30 Days: no previous admission in last 30 days         Anticipated Changes Related to Illness: none    Equipment Needed After Discharge: none    Discharge Facility/Level of Care Needs: other (see comments) (home with self care)    Patient at risk for readmission?: Yes    Discharge Plan:    Screen findings are: Care Manager reviewed the plan of the patient's care with the Multidisciplinary Team. No discharge planning needs identified at this time. Care Manager will continue to manage plan and monitor patient's progress with the team.    Expected Discharge Date: 03/04/17    Patient and/or family were provided with choice of facilities / services that are available and appropriate to meet post hospital care needs?: Yes   List choices in order highest to lowest preferred, if applicable. : [facility]    Initial Assessment complete?: Yes

## 2017-03-03 NOTE — Unmapped (Signed)
Problem: Patient Care Overview  Goal: Plan of Care Review  Outcome: Progressing  Patient has remained free of falls and injuries so far this shift. She denied pain, endorsed a slightly less than normal appetite. All renal meds given as ordered. Falls precautions in place. Patient is oriented and cooperative with her plan of care with a flat affect. Vital signs stable. Continuing to monitor.   Goal: Individualization and Mutuality  Outcome: Progressing    Goal: Discharge Needs Assessment  Outcome: Progressing    Goal: Interprofessional Rounds/Family Conf  Outcome: Progressing      Problem: Fall Risk (Adult)  Goal: Identify Related Risk Factors and Signs and Symptoms  Related risk factors and signs and symptoms are identified upon initiation of Human Response Clinical Practice Guideline (CPG).   Outcome: Progressing    Goal: Absence of Fall  Patient will demonstrate the desired outcomes by discharge/transition of care.   Outcome: Progressing      Problem: Kidney Transplant (Adult)  Goal: Signs and Symptoms of Listed Potential Problems Will be Absent, Minimized or Managed (Kidney Transplant)  Signs and symptoms of listed potential problems will be absent, minimized or managed by discharge/transition of care (reference Kidney Transplant (Adult) CPG).   Outcome: Progressing    Goal: Anesthesia/Sedation Recovery  Outcome: Progressing      Problem: Self-Care Deficit (Adult,Obstetrics,Pediatric)  Goal: Identify Related Risk Factors and Signs and Symptoms  Related risk factors and signs and symptoms are identified upon initiation of Human Response Clinical Practice Guideline (CPG).   Outcome: Progressing    Goal: Improved Ability to Perform BADL and IADL  Patient will demonstrate the desired outcomes by discharge/transition of care.   Outcome: Progressing

## 2017-03-03 NOTE — Unmapped (Signed)
Medicine Daily Progress Note  Interval Events/Subjective:  No concerns overnight. Cough seems to be improved. Expressing concern about missing her first day of culinary classes tomorrow, but understanding of need for further evaluation.     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 *    Rachel [name] is a 47 y.o. 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 Plan for renal biopsy tomorrow.   ??  Elevated Amylase/Lipase - Concern for pancrease rejection: Amylase 329 and Lipase 1925 in clinic on 1/4, now improving in setting of pulse-dose steroids. Pancreas u/s with patent vasculature. One addl dose of solumedrol tomorrow to complete 3 days. C-peptide and A1C normal.   -- Follow Amylase/Lipase-- Downtrending  -- Transplant surgery following   -- Solumedrol 500mg  x 3 days (last day tomorrow)  -- HLA Ab's: pending  ??  AKI: Creatinine has been creeping up for the past few years from a baseline of about 1.3-1.4, now >2 with proteinuria. Ultrasound unremarkable.  -- Monitor creatinine, avoid nephrotoxins  -- Plan for renal biopsy in AM to evaluate for rejection  -- Bicarb 1950mg  TID  ??  S/p Kidney-Pancreas Transplant: s/p deceased donor kidney and pancreas transplant on 01/31/2009, secondary to Type 1 Diabetes mellitus. Also with prior kidney transplant in 1998 that failed in 2008.  No prior history of rejection or recurrent disease in most recent transplant.   -- Continue home Myfortic 360 mg BID  -- Continue home Tacrolimus 4 mg in the AM and 3 mg qhs  ??  Cough - improving. Reports almost three weeks of a cough with sinus congestion and post-nasal drip.  Denies any fevers or chills at home.  Non-toxic appearing, lungs clear to auscultation. CT chest unremarkable. Plan for azithro x5 days.  -- Azithromycin 500 mg x 1, then 250 mg x 4 days  ??  Metabolic Acidosis: Bicarb more recently has been 13.  11 On admission.  Had not been taking home bicarbonate.  -- Sodium Bicarbonate 1950 mg TID as above  ??  Anemia: Received IV Iron in September 2018 with improvement in Hemoglobin.  Hemoglobin stable and at baseline  ??  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  Wounds have now all healed.  Patient follows with orthopedic surgery as an outpatient.  No increased swelling beyond baseline, no pain, no erythema.      Daily Checklist:  Diet: NPO and Regular Diet (NPO at MN for bx)  DVT PPx: hold heparin for bx in AM   GI PPx: PPI  Code Status: Full Code  Dispo: Admit to [facility]  ___________________________________________________________________    Labs/Studies:  Labs and Studies from the last 24hrs per EMR and  Reviewed    Objective:  Temp:  [36.6 ??C-37.2 ??C] 37.2 ??C  Heart Rate:  [88-92] 91  Resp:  [16-18] 16  BP: (154-169)/(59-75) 156/65  SpO2:  [95 %-100 %] 100 %, BP 156/65  - Pulse 91  - Temp 37.2 ??C (Oral)  - Resp 16  - Ht 157.5 cm (5' 2)  - Wt 69.9 kg (154 lb)  - SpO2 100%  - BMI 28.17 kg/m??     General: eating breakfast, appears comfortable and in NAD.   HEENT: Head is atraumatic and normocephalic. Sclera anicteric without injection.   Neck: Supple.  Lungs: Lungs CTA throughout without crackles or wheezing. No coughing.   Heart: Regular rate and rhythm. Soft systolic murmur.   Abdomen: Soft, nontender, nondistended.  Extremities: No clubbing, cyanosis, Swelling Right > Left lower extremity (Chronic issue)

## 2017-03-04 DIAGNOSIS — T8611 Kidney transplant rejection: Principal | ICD-10-CM

## 2017-03-04 LAB — EGFR MDRD AF AMER
Glomerular filtration rate/1.73 sq M.predicted.black:ArVRat:Pt:Ser/Plas/Bld:Qn:Creatinine-based formula (MDRD): 30 — ABNORMAL LOW

## 2017-03-04 LAB — COMPREHENSIVE METABOLIC PANEL
ALBUMIN: 3.3 g/dL — ABNORMAL LOW (ref 3.5–5.0)
ALKALINE PHOSPHATASE: 120 U/L (ref 38–126)
ALT (SGPT): 18 U/L (ref 15–48)
ANION GAP: 9 mmol/L (ref 9–15)
AST (SGOT): 13 U/L — ABNORMAL LOW (ref 14–38)
BILIRUBIN TOTAL: 0.3 mg/dL (ref 0.0–1.2)
BUN / CREAT RATIO: 16
CALCIUM: 9.4 mg/dL (ref 8.5–10.2)
CHLORIDE: 117 mmol/L — ABNORMAL HIGH (ref 98–107)
CO2: 16 mmol/L — ABNORMAL LOW (ref 22.0–30.0)
CREATININE: 2.12 mg/dL — ABNORMAL HIGH (ref 0.60–1.00)
EGFR MDRD AF AMER: 30 mL/min/{1.73_m2} — ABNORMAL LOW (ref >=60)
GLUCOSE RANDOM: 126 mg/dL (ref 65–179)
POTASSIUM: 4.1 mmol/L (ref 3.5–5.0)
PROTEIN TOTAL: 6.5 g/dL (ref 6.5–8.3)
SODIUM: 142 mmol/L (ref 135–145)

## 2017-03-04 LAB — LYMPHOCYTES ABSOLUTE COUNT: Lab: 1.4 — ABNORMAL LOW

## 2017-03-04 LAB — CBC W/ AUTO DIFF
BASOPHILS ABSOLUTE COUNT: 0 10*9/L (ref 0.0–0.1)
EOSINOPHILS ABSOLUTE COUNT: 0 10*9/L (ref 0.0–0.4)
HEMOGLOBIN: 9.1 g/dL — ABNORMAL LOW (ref 12.0–16.0)
LARGE UNSTAINED CELLS: 1 % (ref 0–4)
LYMPHOCYTES ABSOLUTE COUNT: 1.4 10*9/L — ABNORMAL LOW (ref 1.5–5.0)
MEAN CORPUSCULAR HEMOGLOBIN CONC: 29.8 g/dL — ABNORMAL LOW (ref 31.0–37.0)
MEAN CORPUSCULAR HEMOGLOBIN: 29 pg (ref 26.0–34.0)
MEAN CORPUSCULAR VOLUME: 97.3 fL (ref 80.0–100.0)
MEAN PLATELET VOLUME: 8.6 fL (ref 7.0–10.0)
NEUTROPHILS ABSOLUTE COUNT: 16.3 10*9/L — ABNORMAL HIGH (ref 2.0–7.5)
PLATELET COUNT: 258 10*9/L (ref 150–440)
RED BLOOD CELL COUNT: 3.15 10*12/L — ABNORMAL LOW (ref 4.00–5.20)
RED CELL DISTRIBUTION WIDTH: 16.1 % — ABNORMAL HIGH (ref 12.0–15.0)
WBC ADJUSTED: 18.6 10*9/L — ABNORMAL HIGH (ref 4.5–11.0)

## 2017-03-04 LAB — CBC
HEMATOCRIT: 35.4 % — ABNORMAL LOW (ref 36.0–46.0)
HEMATOCRIT: 30.5 % — ABNORMAL LOW (ref 36.0–46.0)
MEAN CORPUSCULAR HEMOGLOBIN CONC: 30.5 g/dL — ABNORMAL LOW (ref 31.0–37.0)
MEAN CORPUSCULAR HEMOGLOBIN CONC: 31.4 g/dL (ref 31.0–37.0)
MEAN CORPUSCULAR HEMOGLOBIN: 30.5 pg (ref 26.0–34.0)
MEAN CORPUSCULAR HEMOGLOBIN: 30.3 pg (ref 26.0–34.0)
MEAN CORPUSCULAR VOLUME: 99.9 fL (ref 80.0–100.0)
MEAN CORPUSCULAR VOLUME: 96.6 fL (ref 80.0–100.0)
MEAN PLATELET VOLUME: 10 fL (ref 7.0–10.0)
MEAN PLATELET VOLUME: 8.2 fL (ref 7.0–10.0)
PLATELET COUNT: 176 10*9/L (ref 150–440)
PLATELET COUNT: 241 10*9/L (ref 150–440)
RED BLOOD CELL COUNT: 3.55 10*12/L — ABNORMAL LOW (ref 4.00–5.20)
RED BLOOD CELL COUNT: 3.16 10*12/L — ABNORMAL LOW (ref 4.00–5.20)
RED CELL DISTRIBUTION WIDTH: 16.1 % — ABNORMAL HIGH (ref 12.0–15.0)
RED CELL DISTRIBUTION WIDTH: 16 % — ABNORMAL HIGH (ref 12.0–15.0)
WBC ADJUSTED: 15 10*9/L — ABNORMAL HIGH (ref 4.5–11.0)

## 2017-03-04 LAB — RED CELL DISTRIBUTION WIDTH: Lab: 16 — ABNORMAL HIGH

## 2017-03-04 LAB — AMYLASE: Chemistry studies:Cmplx:-:^Patient:Set:: 166 — ABNORMAL HIGH

## 2017-03-04 LAB — MEAN PLATELET VOLUME: Lab: 10

## 2017-03-04 LAB — MAGNESIUM: Magnesium:MCnc:Pt:Ser/Plas:Qn:: 2.3 — ABNORMAL HIGH

## 2017-03-04 LAB — PROTIME-INR: PROTIME: 11.1 s (ref 10.2–12.8)

## 2017-03-04 LAB — LIPASE: Triacylglycerol lipase:CCnc:Pt:Ser/Plas:Qn:: 783 — ABNORMAL HIGH

## 2017-03-04 LAB — PHOSPHORUS: Phosphate:MCnc:Pt:Ser/Plas:Qn:: 3.7

## 2017-03-04 LAB — APTT: HEPARIN CORRELATION: <0.2

## 2017-03-04 LAB — HEPARIN CORRELATION: Lab: <0.2

## 2017-03-04 LAB — PROTIME: Lab: 11.1

## 2017-03-04 NOTE — Unmapped (Signed)
Order was placed for PIV by Venous Access Team (VAT).  Patient was assessed for placement of a PIV. Access was obtained. Blood return noted.  Dressing intact and device well secured.  Flushed with normal saline.  Pt advised to inform RN of any s/s of discomfort at the PIV site.    Workup / Procedure Time:  15 minutes      The primary RN was notified.       Thank you,     Oris Drone RN Venous Access Team

## 2017-03-04 NOTE — Unmapped (Signed)
Problem: Patient Care Overview  Goal: Plan of Care Review  Outcome: Progressing  Patient had a quiet night. Denies pain. Voided clear yellow urine. Patient has been NPO after MN for renal biopsy this am. Patient remains on droplet precautions pending results of Respiratory viral panel.   Goal: Individualization and Mutuality  Outcome: Progressing   03/04/17 0621   Individualization   Patient Specific Preferences likes ice water   Patient Specific Goals (Include Timeframe) improved renal/ pancreas function   Patient Specific Interventions NPO after MN for renal Biopsy, administer steroids     Goal: Discharge Needs Assessment  Outcome: Progressing   03/04/17 0621   Discharge Needs Assessment   Concerns to be Addressed denies needs/concerns at this time   Readmission Within the Last 30 Days no previous admission in last 30 days   Patient/Family Anticipates Transition to home with family       Problem: Fall Risk (Adult)  Goal: Identify Related Risk Factors and Signs and Symptoms  Related risk factors and signs and symptoms are identified upon initiation of Human Response Clinical Practice Guideline (CPG).   Outcome: Progressing    Goal: Absence of Fall  Patient will demonstrate the desired outcomes by discharge/transition of care.   Outcome: Progressing   03/04/17 0621   Fall Risk (Adult)   Absence of Fall making progress toward outcome       Problem: Self-Care Deficit (Adult,Obstetrics,Pediatric)  Goal: Identify Related Risk Factors and Signs and Symptoms  Related risk factors and signs and symptoms are identified upon initiation of Human Response Clinical Practice Guideline (CPG).   Outcome: Progressing    Goal: Improved Ability to Perform BADL and IADL  Patient will demonstrate the desired outcomes by discharge/transition of care.   Outcome: Progressing   03/04/17 0621   Self-Care Deficit (Adult,Obstetrics,Pediatric)   Improved Ability to Perform BADL and IADL making progress toward outcome       Problem: VTE, DVT and PE (Adult)  Goal: Signs and Symptoms of Listed Potential Problems Will be Absent, Minimized or Managed (VTE, DVT and PE)  Signs and symptoms of listed potential problems will be absent, minimized or managed by discharge/transition of care (reference VTE, DVT and PE (Adult) CPG).   Outcome: Progressing   03/04/17 0621   VTE, DVT and PE (Adult)   Problems Assessed (VTE, DVT, PE) all   Problems Present (VTE, DVT, PE) none

## 2017-03-04 NOTE — Unmapped (Signed)
US Renal Transplant Biopsy Note  Date of Scheduled Biopsy: 03/04/17  Rush?: Yes  Patient Name: Rachel [name]  MR: [id number]  Age: 47 y.o.  Gender: Female  Race: African American  Procedures: Ultrasound Guided Percutaneous Transplant Kidney Biopsy under Moderate Sedation  Tissue Submitted: Kidney  Special Studies Required: LM, IF, EM    Please contact Dr. Margaretmary Bayley with the results. If unable to reach them, please contact the on call nephrology attending.  ----------------------------------------------------------------------------------------------------------------------  Date of allograft implantation: 01/31/09  Underlying native kidney disease: type 1 diabetes  Was the diagnosis established by biopsy? no   Previous transplant biopsies? no   If yes, what were the previous diagnoses?   Previous kidney transplants: yes If yes, this is #: 2   History/Clinical Diagnosis/Indication for Biopsy:   37F s/p kidney-pancrease transplant on 01/31/09. Baseline creatinine has steadily increased in the setting of diabetic foot infections, most currently now up > 2. She presented with abdominal pain with elevated lipase and amylase concerning for pancreatic transplant rejection. In light of her slowly rising creatinine, would like to also get kidney biopsy to rule out concomitant renal transplant rejection.  ----------------------------------------------------------------------------------------------------------------------  Current Baseline Immunosuppression: tacrolimus and mycophenolate  Specific anti-rejection treatment before biopsy: yes   If yes, what was the type of treatment? Bolus steroids  Patient off immunosuppression?: no   Patient seems compliant? yes   Patient is currently back on hemodialysis? no   ----------------------------------------------------------------------------------------------------------------------  Evidence of allo-antibodies? No    If yes, HLA type/MFI?:   Blood Pressure (mmHg):   BP Readings from Last 3 Encounters:   03/03/17 156/65   03/01/17 124/62   12/17/16 143/76     Urinalysis:   Lab Results   Component Value Date    COLORU Yellow 03/01/2017    COLORU Yellow 04/06/2016    COLORU Yellow 03/17/2014    SPECGRAV 1.013 03/01/2017    SPECGRAV 1.020 04/06/2016    PHUR 6.5 03/01/2017    PHUR 6.0 04/06/2016    PHUR 5.5 03/17/2014    GLUCOSEU Negative 03/01/2017    GLUCOSEU NEGATIVE 03/17/2014    KETONESU Negative 03/01/2017    KETONESU NEGATIVE 03/17/2014    BLOODU Negative 03/01/2017    BLOODU NEGATIVE 03/17/2014    NITRITE Negative 03/01/2017    NITRITE Positive 04/06/2016    LEUKOCYTESUR Negative 03/01/2017    LEUKOCYTESUR 1+ 04/06/2016    LEUKOCYTESUR +2 03/17/2014    UROBILINOGEN 0.2 mg/dL 47/82/9562    UROBILINOGEN 0.2 mg/dL 13/09/6576    UROBILINOGEN 1 03/17/2014    BILIRUBINUR Negative 03/01/2017    BILIRUBINUR Negative 04/06/2016    BILIRUBINUR NEGATIVE 03/17/2014    WBCU 11 (H) 03/17/2014    RBCU 1 03/17/2014     Urine protein/creatinine ratio: 0.503  Creatinine (present peak): 2.22  Creatinine (baseline): 1.5-1.7  Lab Results   Component Value Date    CREATININE 2.11 (H) 03/03/2017    CREATININE 1.92 (H) 03/02/2017    CREATININE 2.10 (H) 03/01/2017    CREATININE 2.22 (H) 03/01/2017    CREATININE 1.58 (H) 11/06/2016     ----------------------------------------------------------------------------------------------------------------------  Clinical signs of infections at time of current biopsy:  BK: no  BK blood viral load (if positive):  CMV: no  CMV viral load (if positive):  Herpes: no   Hepatitis B: no   Hepatitis C: no   Bacteria: no   Fungi: no   Urinary tract infection: no   ----------------------------------------------------------------------------------------------------------------------  Stenosis of renal artery: no   Obstruction of ureter: no  Lymphocele: no   ----------------------------------------------------------------------------------------------------------------------    4210 SURGICAL PATHOLOGY REQUEST FORM  DATE      [facility] Test#  CPT Code Description   4250  88331  FROZEN SECTION - SINGLE   4251  88332  FROZEN SECTIO - ADDITIONAL         4227  88300  GROSS ONLY (NO SECTION)   4228  88302  LEVEL II - GROSS & MICRO EXAM   4229  88304  LEVEL III - GROSS & MICRO EXAM   4230  88305  LEVEL IV - GROSS & MICRO EXAM   4231  88307  LEVEL V - GROSS & MICRO EXAM   4232  88309  LEVEL VI - GROSS & MICRO EXAM   4233  88311  DECALCIFICATION   4234  88321  CONSULT ON REFERRED SLIDES   4235  88323  CONSULT WITH SLIDE PREP   4236  88329  CONSULT DURING SURGERY   4237  [phone]  BONE MARROW ASPIRATION     DEPT. USE ONLY     CODE QTY MISCELLANEOUS (SPECIFY) AMOUNT

## 2017-03-04 NOTE — Unmapped (Signed)
2nd pass

## 2017-03-04 NOTE — Unmapped (Signed)
Procedure complete.  Band-aid and abdominal binder applied.

## 2017-03-04 NOTE — Unmapped (Signed)
1st pass

## 2017-03-04 NOTE — Unmapped (Addendum)
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 Hospital course by problem below.     AKI 2/2/ acute rejection: Creatinine has been creeping up for the past few years from a baseline of about 1.3-1.4, now >2 with proteinuria. Ultrasound unremarkable.   - Renal Biopsy complete: Acute tubulointerstitial cellular rejection, with mild focal transplant endarteritis, Mild arteriosclerosis, mild to moderate interstitial fibrosis and tubular atrophy.  -- Thymoglobulin 150mg  q24h x 10 days, day 9/10 (1/7- 1/16)    Elevated Amylase/Lipase - Concern for pancrease rejection:??Amylase 329 and Lipase 1925 in clinic on 1/4 w/ down trending values to the mid 700's.   - Chest/Abd/Pelv CT scan w/o contrast: No acute abnormalities  - Pancreatic Ultrasound: Patent venous and arterial vasculature to the pancreatic graft.  - Solumedrol 500mg  x 3 days  - C-peptide: wnl  - HLA Ab's- Neg    S/p Kidney-Pancreas Transplant: s/p deceased donor kidney and pancreas transplant on 01/31/2009, secondary to Type 1 Diabetes mellitus. Also with prior kidney transplant in 1998 that failed in 2008.????No prior history of rejection or recurrent disease in most recent transplant.??  -- Myfortic 180??mg BID (while taking Thymoglobulin) and transition back to 360mg  BID on discharge  -- Tacrolimus throughout stay; @ discharge Tacrolimus regimen 5mg /4mg  morning/evening  -- Valganciclovir 450mg  daily  -- Bactrim 1 tablet (M/W/F)  -- 10mg  Prednisone will be supplied upon discharge and will be continued until patient sees her nephrologist outpatient.   -- Bicarb 1950mg  TID??    Cough (Resolved):  three weeks of a cough with sinus congestion and post-nasal drip. ??Denies any fevers or chills at home. ??Non-toxic appearing, lungs clear to auscultation. CT chest unremarkable  - Azithromycin 500mg  x 1, 250mg  x 4days  Metabolic Acidosis:??Bicarb more recently has been 13. Admits to not having taken home bicarb in weeks d/t not having it.   - Sodium Bicarbonate 1950 mg TID

## 2017-03-04 NOTE — Unmapped (Signed)
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 *      Rachel [name]??is a 47 y.o.??female??s/p deceased donor renal and pancreas transplant in 2010, history of UTI, history of chronic right foot wound who presented to [facility] from transplant nephrology clinic due to elevated amylase and lipase, concerning for pancreatic rejection, now improving with IV solumedrol. 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 Plan for renal biopsy tomorrow.   ??  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 Pancreas u/s with patent vasculature. One addl dose of solumedrol tomorrow to complete 3 days. C-peptide and A1C normal.   -- Follow Amylase/Lipase  -- Transplant surgery following   -- Solumedrol 500mg  x 3 days (last day tomorrow)  -- HLA Ab's: pending  ??  AKI: Creatinine has been creeping up for the past few years from a baseline of about 1.3-1.4, now >2 with proteinuria. Ultrasound unremarkable.  -- Monitor creatinine, avoid nephrotoxins  -- Plan for renal biopsy in AM to evaluate for rejection: pending  -- Bicarb 1950mg  TID  ??  S/p Kidney-Pancreas Transplant: s/p deceased donor kidney and pancreas transplant on 01/31/2009, secondary to Type 1 Diabetes mellitus. Also with prior kidney transplant in 1998 that failed in 2008. ??No prior history of rejection or recurrent disease in most recent transplant.??  -- Continue home Myfortic 360 mg BID  -- Continue home Tacrolimus 4 mg in the AM and 3 mg qhs  ??  Cough - improving. Reports almost three weeks of a cough with sinus congestion and post-nasal drip. ??Denies any fevers or chills at home. ??Non-toxic appearing, lungs clear to auscultation. CT chest unremarkable. Plan for azithro x5 days.  -- Azithromycin 500 mg x 1, then 250 mg x 4 days  ??  Metabolic Acidosis:??Bicarb more recently has been 13. ??11 On admission. ??Had not been taking home bicarbonate.  -- Sodium Bicarbonate 1950 mg TID as above  ??  Anemia:??Received IV Iron in September 2018 with improvement in Hemoglobin. ??Hemoglobin stable and at baseline  ??  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 ??Wounds have now all healed. ??Patient follows with orthopedic surgery as an outpatient. ??No increased swelling beyond baseline, no pain, no erythema. ??  ??  Daily Checklist:  Diet: NPO and Regular Diet (NPO at [facility] for bx)  DVT PPx: hold heparin for bx in AM   GI PPx: PPI  Code Status: Full Code  Dispo: Admit to MDB  ___________________________________________________________________    Labs/Studies:  Labs and Studies from the last 24hrs per EMR and Reviewed    Objective:  Temp:  [37 ??C-37.2 ??C] 37.1 ??C  Heart Rate:  [90-93] 93  Resp:  [16-18] 18  BP: (156-166)/(64-75) 166/75  SpO2:  [97 %-100 %] 97 %, BP 166/75  - Pulse 93  - Temp 37.1 ??C (Oral)  - Resp 18  - Ht 157.5 cm (5' 2)  - Wt 69.9 kg (154 lb)  - SpO2 97%  - BMI 28.17 kg/m??     General: eating breakfast, appears comfortable and in NAD.   HEENT: Head is atraumatic and normocephalic. Sclera anicteric without injection.   Neck: Supple.  Lungs: Lungs CTA throughout without crackles or wheezing. No coughing.   Heart: Regular rate and rhythm. Soft systolic murmur.   Abdomen: Soft, nontender, nondistended.  Extremities: No clubbing, cyanosis, Swelling Right > Left lower extremity (Chronic issue)    Helyn App, MD  PGY-1 Resident

## 2017-03-04 NOTE — Unmapped (Signed)
Assessment/Plan:    [name] is a 47 y.o. female who will undergo Ultrasound-guided transplant kidney biopsy    1. Indications and risks/benefits of procedure reviewed with patient.    2. Consent signed and present on patient's charge.   3. No cardiopulmonary or other medical contraindications present therefore will proceed with biopsy.       CC: No chief complaint on file.      HPI: [name] is a 47 y.o. female who will undergo Ultrasound-guided transplant kidney biopsy with moderate sedation.    Allergies:   Allergies   Allergen Reactions   ??? Oxycodone-Acetaminophen Other (See Comments)      crazy dreams       Medications:   Current Facility-Administered Medications   Medication Dose Route Frequency Provider Last Rate Last Dose   ??? acetaminophen (TYLENOL) tablet 325 mg  325 mg Oral Q4H PRN Clance Boll, MD       ??? albuterol (PROVENTIL HFA;VENTOLIN HFA) 90 mcg/actuation inhaler 2 puff  2 puff Inhalation Q6H PRN Clance Boll, MD       ??? amitriptyline (ELAVIL) tablet 25 mg  25 mg Oral BID Alexis Goodell, MD       ??? azithromycin [facility]) tablet 250 mg  250 mg Oral Q24H [facility] Clance Boll, MD   250 mg at 03/03/17 0954   ??? calcium carbonate (OS-CAL) tablet 600 mg of elem calcium  600 mg of elem calcium Oral Daily Clance Boll, MD   600 mg of elem calcium at 03/03/17 0955   ??? cholecalciferol (vitamin D3) tablet 400 Units  400 Units Oral Daily Clance Boll, MD   400 Units at 03/03/17 0955   ??? methylPREDNISolone sodium succinate (PF) (Solu-MEDROL) 500 mg in sodium chloride (NS) 0.9 % 50 mL IVPB  500 mg Intravenous Daily Edson Snowball, MD 130 mL/hr at 03/03/17 0954 500 mg at 03/03/17 0954   ??? multivitamins, therapeutic with minerals tablet 1 tablet  1 tablet Oral Daily Clance Boll, MD   1 tablet at 03/03/17 0955   ??? mycophenolate (MYFORTIC) EC tablet 360 mg  360 mg Oral BID Clance Boll, MD   360 mg at 03/03/17 0955   ??? omeprazole (PriLOSEC) capsule 40 mg  40 mg Oral BID Clance Boll, MD   40 mg at 03/03/17 1331   ??? polyethylene glycol (GLYCOLAX) powder (BULK CONTAINER) 17 g  17 g Oral Daily PRN Clance Boll, MD       ??? sodium bicarbonate tablet 1,950 mg  1,950 mg Oral TID Edson Snowball, MD   1,950 mg at 03/03/17 1331   ??? tacrolimus (PROGRAF) capsule 3 mg  3 mg Oral Nightly (2000) Clance Boll, MD   3 mg at 03/02/17 2048   ??? tacrolimus (PROGRAF) capsule 4 mg  4 mg Oral Daily Clance Boll, MD   4 mg at 03/03/17 0954       PMH:   Past Medical History:   Diagnosis Date   ??? Anemia    ??? Chronic kidney disease    ??? CTS (carpal tunnel syndrome)    ??? Diabetes mellitus (CMS-HCC)     history prior to pancreas transplant-a1c 5.2 2016 without DM meds   ??? GERD (gastroesophageal reflux disease)    ??? Hyperlipidemia 01/31/2009   ??? Hypertension    ??? Kidney transplanted    ??? Pancreas transplanted (CMS-HCC)    ??? Pneumonia 2010       ASA  Grade: ASA 2 - Patient with mild systemic disease with no functional limitations    ROS:  General: Denies fever or chills.  Cardiovascular: Denies chest pain.   Pulmonary: Denies shortness of breath, snoring, sleep apnea, or respiratory infection.    Allergies:   Allergies   Allergen Reactions   ??? Oxycodone-Acetaminophen Other (See Comments)      crazy dreams           PE:    Vitals:    03/03/17 1140   BP: 156/65   Pulse: 91   Resp: 16   Temp: 37.2 ??C   SpO2: 100%     General:  Well-appearing female in NAD.  Airway assessment: Class 2 - Can visualize soft palate and fauces, tip of uvula is obscured  Cardiovascular:  Regular rate and rhythm.  No murmurs, gallops or rubs.   Lungs: respirations nonlabored; clear to auscultation bilaterally

## 2017-03-04 NOTE — Unmapped (Signed)
KIDNEY BIOPSY PROCEDURE NOTE    INDICATIONS:  Assess for rejection     CONSENT/TIME OUT:    Risks, benefits and alternatives including blood loss requiring transfusion, loss of kidney or kidney function, and death were discussed with patient.  Written informed consent was obtained prior to the procedure and is detailed in the medical record.  Prior to the start of the procedure, a time out was taken and the identity of the patient was confirmed via name, medical record number and date of birth.  The availability of the correct equipment was verified.    PROCEDURE:  Name: Transplant Kidney Biopsy  Description: Ultrasound guided transplant kidney biopsy     Pre-Procedure blood specimens were sent for CBC, PT/aPTT, and type & screen.     The patient was given 1 mg of midazolam and 25 mcg of fentanyl during the procedure, and 7 mL lidocaine 1% were used for local anesthesia. Under ultrasound guidance, a 16cm 16G biopsy needle was inserted into the left kidney for a total of 2 passes with 2 core specimens obtained for analysis.      COMPLICATIONS:  Complications:  Small bleed after first pass  Complications of the procedure: bleeding resolved after 5 mins pressure     The patient will be closely watched in the inpatient floor, kept flat for 6 hours while wearing an abdominal binder, and will have a repeat CBC in 4 hours to insure no hemodynamically significant bleeding post-biopsy.    SPECIMEN(S):  Core #1: 1.5 x 0.1 x 0.1 (LM 1.3, EM 0.2)  Core #2: 1.7 x 0.1 x 0.1 (IF 0.3, LM 1.4)    Rachel [name]  Nephrology and Hypertension, PGY5  Pager: [phone]  March 04, 2017 11:43 AM

## 2017-03-04 NOTE — Unmapped (Signed)
Problem: Patient Care Overview  Goal: Plan of Care Review  Outcome: Progressing  Pt aox4, vss, nad, resting well. Strict bedrest until ~1700 s/p renal bx. Denies pain or needs at this time. Bed locked in low position, call bell in reach, encouraged to call. Will continue to monitor.   Goal: Discharge Needs Assessment  Outcome: Progressing   03/02/17 1559 03/04/17 0621   OTHER   Equipment Currently Used at Home walker, rolling --    Patient and/or family were provided with choice of facilities / services that are available and appropriate to meet post hospital care needs? Yes --    Discharge Needs Assessment   Concerns to be Addressed --  denies needs/concerns at this time   Readmission Within the Last 30 Days --  no previous admission in last 30 days   Patient/Family Anticipates Transition to --  home with family   Anticipated Changes Related to Illness none --    Equipment Needed After Discharge none --    Discharge Facility/Level of Care Needs other (see comments)  (home with self care) --      Goal: Interprofessional Rounds/Family Conf  Outcome: Progressing      Problem: Fall Risk (Adult)  Goal: Absence of Fall  Patient will demonstrate the desired outcomes by discharge/transition of care.   Outcome: Progressing      Problem: Kidney Transplant (Adult)  Goal: Signs and Symptoms of Listed Potential Problems Will be Absent, Minimized or Managed (Kidney Transplant)  Signs and symptoms of listed potential problems will be absent, minimized or managed by discharge/transition of care (reference Kidney Transplant (Adult) CPG).   Outcome: Resolved Date Met: 03/04/17   03/03/17 1308   Kidney Transplant (Adult)   Problems Assessed (Kidney Transplant) all   Problems Present (Kidney Transplant) none     Goal: Anesthesia/Sedation Recovery  Outcome: Progressing      Problem: Self-Care Deficit (Adult,Obstetrics,Pediatric)  Goal: Improved Ability to Perform BADL and IADL  Patient will demonstrate the desired outcomes by discharge/transition of care.   Outcome: Progressing      Problem: VTE, DVT and PE (Adult)  Goal: Signs and Symptoms of Listed Potential Problems Will be Absent, Minimized or Managed (VTE, DVT and PE)  Signs and symptoms of listed potential problems will be absent, minimized or managed by discharge/transition of care (reference VTE, DVT and PE (Adult) CPG).   Outcome: Progressing

## 2017-03-05 LAB — CBC W/ AUTO DIFF
BASOPHILS ABSOLUTE COUNT: 0 10*9/L (ref 0.0–0.1)
EOSINOPHILS ABSOLUTE COUNT: 0 10*9/L (ref 0.0–0.4)
HEMATOCRIT: 30.2 % — ABNORMAL LOW (ref 36.0–46.0)
HEMOGLOBIN: 9.4 g/dL — ABNORMAL LOW (ref 12.0–16.0)
LARGE UNSTAINED CELLS: 0 % (ref 0–4)
LYMPHOCYTES ABSOLUTE COUNT: 0.2 10*9/L — ABNORMAL LOW (ref 1.5–5.0)
MEAN CORPUSCULAR HEMOGLOBIN: 30 pg (ref 26.0–34.0)
MEAN CORPUSCULAR VOLUME: 96.7 fL (ref 80.0–100.0)
MEAN PLATELET VOLUME: 8 fL (ref 7.0–10.0)
MONOCYTES ABSOLUTE COUNT: 0.7 10*9/L (ref 0.2–0.8)
NEUTROPHILS ABSOLUTE COUNT: 17.8 10*9/L — ABNORMAL HIGH (ref 2.0–7.5)
RED BLOOD CELL COUNT: 3.12 10*12/L — ABNORMAL LOW (ref 4.00–5.20)
RED CELL DISTRIBUTION WIDTH: 16.3 % — ABNORMAL HIGH (ref 12.0–15.0)
WBC ADJUSTED: 18.8 10*9/L — ABNORMAL HIGH (ref 4.5–11.0)

## 2017-03-05 LAB — FSAB CLASS 1 ANTIBODY SPECIFICITY: HLA CLASS 1 ANTIBODY RESULT: NEGATIVE

## 2017-03-05 LAB — HLA DS POST TRANSPLANT
ANTI-DONOR DRW #1 MFI: 34 MFI
ANTI-DONOR DRW #2 MFI: 55 MFI
ANTI-DONOR HLA-A #1 MFI: 7 MFI
ANTI-DONOR HLA-B #1 MFI: 0 MFI
ANTI-DONOR HLA-B #2 MFI: 11 MFI
ANTI-DONOR HLA-C #1 MFI: 6 MFI
ANTI-DONOR HLA-C #2 MFI: 49 MFI
ANTI-DONOR HLA-DQB #1 MFI: 64 MFI
ANTI-DONOR HLA-DR #2 MFI: 7 MFI
DONOR DRW ANTIGEN #1: 52
DONOR DRW ANTIGEN #2: 53
DONOR HLA-A ANTIGEN #1: 2
DONOR HLA-B ANTIGEN #1: 51
DONOR HLA-B ANTIGEN #2: 71
DONOR HLA-C ANTIGEN #2: 14
DONOR HLA-DQB ANTIGEN #1: 7
DONOR HLA-DR ANTIGEN #1: 4
DONOR HLA-DR ANTIGEN #2: 11

## 2017-03-05 LAB — MAGNESIUM: Magnesium:MCnc:Pt:Ser/Plas:Qn:: 2

## 2017-03-05 LAB — COMPREHENSIVE METABOLIC PANEL
ALBUMIN: 3 g/dL — ABNORMAL LOW (ref 3.5–5.0)
ALKALINE PHOSPHATASE: 113 U/L (ref 38–126)
ALT (SGPT): 20 U/L (ref 15–48)
ANION GAP: 8 mmol/L — ABNORMAL LOW (ref 9–15)
AST (SGOT): 18 U/L (ref 14–38)
BLOOD UREA NITROGEN: 37 mg/dL — ABNORMAL HIGH (ref 7–21)
BUN / CREAT RATIO: 19
CALCIUM: 8.6 mg/dL (ref 8.5–10.2)
CHLORIDE: 117 mmol/L — ABNORMAL HIGH (ref 98–107)
CO2: 17 mmol/L — ABNORMAL LOW (ref 22.0–30.0)
CREATININE: 1.91 mg/dL — ABNORMAL HIGH (ref 0.60–1.00)
EGFR MDRD AF AMER: 34 mL/min/{1.73_m2} — ABNORMAL LOW (ref >=60)
EGFR MDRD NON AF AMER: 28 mL/min/{1.73_m2} — ABNORMAL LOW (ref >=60)
GLUCOSE RANDOM: 140 mg/dL (ref 65–179)
POTASSIUM: 4.1 mmol/L (ref 3.5–5.0)
PROTEIN TOTAL: 5.8 g/dL — ABNORMAL LOW (ref 6.5–8.3)
SODIUM: 142 mmol/L (ref 135–145)

## 2017-03-05 LAB — HLA CL2 AB COMMENT: Lab: 0

## 2017-03-05 LAB — FSAB CLASS 2 ANTIBODY SPECIFICITY: HLA CL2 AB RESULT: NEGATIVE

## 2017-03-05 LAB — PHOSPHORUS: Phosphate:MCnc:Pt:Ser/Plas:Qn:: 3.7

## 2017-03-05 LAB — MEAN CORPUSCULAR HEMOGLOBIN CONC: Lab: 31.1

## 2017-03-05 LAB — DONOR HLA-C ANTIGEN #1: Lab: 7

## 2017-03-05 LAB — BUN / CREAT RATIO: Urea nitrogen/Creatinine:MRto:Pt:Ser/Plas:Qn:: 19

## 2017-03-05 LAB — HLA CLASS 1 ANTIBODY: Lab: 0

## 2017-03-05 LAB — C-PEPTIDE: C peptide:MCnc:Pt:Ser/Plas:Qn:: 1.3

## 2017-03-05 MED ORDER — VALGANCICLOVIR 450 MG TABLET: 450 mg | tablet | 2 refills | 0 days

## 2017-03-05 MED ORDER — VALGANCICLOVIR 450 MG TABLET
ORAL_TABLET | Freq: Every day | ORAL | 2 refills | 0.00000 days | Status: CP
Start: 2017-03-05 — End: 2017-03-05

## 2017-03-05 NOTE — Unmapped (Signed)
Problem: Patient Care Overview  Goal: Plan of Care Review  Outcome: Progressing    Goal: Individualization and Mutuality  Outcome: Progressing    Goal: Discharge Needs Assessment  Outcome: Progressing    Goal: Interprofessional Rounds/Family Conf  Outcome: Progressing      Problem: Fall Risk (Adult)  Goal: Absence of Fall  Patient will demonstrate the desired outcomes by discharge/transition of care.   Outcome: Progressing      Problem: Kidney Transplant (Adult)  Goal: Anesthesia/Sedation Recovery  Outcome: Progressing      Problem: Self-Care Deficit (Adult,Obstetrics,Pediatric)  Goal: Improved Ability to Perform BADL and IADL  Patient will demonstrate the desired outcomes by discharge/transition of care.   Outcome: Progressing      Problem: VTE, DVT and PE (Adult)  Goal: Signs and Symptoms of Listed Potential Problems Will be Absent, Minimized or Managed (VTE, DVT and PE)  Signs and symptoms of listed potential problems will be absent, minimized or managed by discharge/transition of care (reference VTE, DVT and PE (Adult) CPG).   Outcome: Progressing      Comments: Pt remain free from fall, able to call for assistance prn, pt denies pain, VSS, pt tolerated her first dose of thymo with no complications, no other distress s/s noted, will continue to monitor

## 2017-03-05 NOTE — Unmapped (Signed)
Medicine Daily Progress Note  Interval Events/Subjective:  No acute events overnight, Patient is doing well, she feels fatigued and somewhat warm. However she experiences no tenderness and feels overall okay. She understands the reason she has to stay in the hospital and will watch out for symptoms such as sweating, tenderness, or excessive ffatigue.    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 *      Rachel [name]??is a 47 y.o.??female??s/p deceased donor renal and pancreas transplant in 2010, history of UTI, history of chronic right foot wound who presented to [facility] from transplant nephrology clinic due to elevated amylase and lipase, concerning for pancreatic rejection, now improving with IV solumedrol. 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 Plan for renal biopsy tomorrow.   ??  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 Pancreas u/s with patent vasculature. One addl dose of solumedrol tomorrow to complete 3 days. C-peptide and A1C normal.   -- Follow Amylase/Lipase  -- Transplant surgery following   -- Solumedrol 500mg  x 3 days complete  -- HLA Ab's: pending  ??  AKI: Creatinine has been creeping up for the past few years from a baseline of about 1.3-1.4, now >2 with proteinuria. Ultrasound unremarkable.  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  -- Start Thymoglobulin 100mg  q24h x 7days (1/7- )  -- Bicarb 1950mg  TID  -- CBC w/ Diff daily looking at lymphocyte count   ??  S/p Kidney-Pancreas Transplant: s/p deceased donor kidney and pancreas transplant on 01/31/2009, secondary to Type 1 Diabetes mellitus. Also with prior kidney transplant in 1998 that failed in 2008.????No prior history of rejection or recurrent disease in most recent transplant.??  -- Continue home Myfortic 180 mg BID (while taking Thymoglobulin)   -- Continue home Tacrolimus 5 mg BID  -- Valganciclovir 450mg  Q48h  -- Bactrim 1 tablet (M/W/F)  ??  Cough - improving. Reports almost three weeks of a cough with sinus congestion and post-nasal drip. ??Denies any fevers or chills at home. ??Non-toxic appearing, lungs clear to auscultation. CT chest unremarkable. Plan for azithro x5 days.  -- Azithromycin 500 mg x 1, then 250 mg x 4 days  ??  Metabolic Acidosis:??Bicarb more recently has been 13. ??11 On admission. ??Had not been taking home bicarbonate.  -- Sodium Bicarbonate 1950 mg TID as above  ??  Anemia:??Received IV Iron in September 2018 with improvement in Hemoglobin. ??Hemoglobin stable and at baseline  ??  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 ??Wounds have now all healed. ??Patient follows with orthopedic surgery as an outpatient. ??No increased swelling beyond baseline, no pain, no erythema. ??  ??  Daily Checklist:  Diet:??NPO and Regular Diet??(NPO at [facility] for bx)  DVT PPx:??hold heparin for bx in AM   GI PPx:  PPI  Code Status: Full Code  Dispo:??Admit to [facility]  ___________________________________________________________________    Labs/Studies:  Labs and Studies from the last 24hrs per EMR and Reviewed    Objective:  Temp:  [36.2 ??C-37.6 ??C] 37.6 ??C  Heart Rate:  [82-100] 88  Resp:  [16-26] 18  BP: (132-170)/(59-92) 150/62  SpO2:  [95 %-99 %] 97 %, BP 150/62  - Pulse 88  - Temp 37.6 ??C (Oral)  - Resp 18  - Ht 157.5 cm (5' 2)  - Wt 69.9 kg (154 lb)  - SpO2 97%  - BMI 28.17 kg/m??     General: eating breakfast, appears comfortable and in NAD.   HEENT: Head is atraumatic and normocephalic. Sclera anicteric without injection.   Neck: Supple.  Lungs: Lungs CTA throughout without crackles or wheezing. No coughing.   Heart: Regular rate and rhythm. Soft systolic murmur.   Abdomen: Soft, nontender, nondistended. clean back, no redness or tenderness  Extremities: No clubbing, cyanosis, Swelling Right > Left lower extremity (Chronic issue)      Helyn App, MD  PGY-1 Resident

## 2017-03-06 LAB — CBC W/ AUTO DIFF
BASOPHILS ABSOLUTE COUNT: 0 10*9/L (ref 0.0–0.1)
EOSINOPHILS ABSOLUTE COUNT: 0 10*9/L (ref 0.0–0.4)
HEMATOCRIT: 30.9 % — ABNORMAL LOW (ref 36.0–46.0)
HEMOGLOBIN: 9.8 g/dL — ABNORMAL LOW (ref 12.0–16.0)
LYMPHOCYTES ABSOLUTE COUNT: 0.4 10*9/L — ABNORMAL LOW (ref 1.5–5.0)
MEAN CORPUSCULAR HEMOGLOBIN CONC: 31.6 g/dL (ref 31.0–37.0)
MEAN CORPUSCULAR HEMOGLOBIN: 30.1 pg (ref 26.0–34.0)
MEAN CORPUSCULAR VOLUME: 95.3 fL (ref 80.0–100.0)
MEAN PLATELET VOLUME: 8.4 fL (ref 7.0–10.0)
MONOCYTES ABSOLUTE COUNT: 0.4 10*9/L (ref 0.2–0.8)
NEUTROPHILS ABSOLUTE COUNT: 9.1 10*9/L — ABNORMAL HIGH (ref 2.0–7.5)
PLATELET COUNT: 141 10*9/L — ABNORMAL LOW (ref 150–440)
RED BLOOD CELL COUNT: 3.24 10*12/L — ABNORMAL LOW (ref 4.00–5.20)
RED CELL DISTRIBUTION WIDTH: 16.1 % — ABNORMAL HIGH (ref 12.0–15.0)
WBC ADJUSTED: 10 10*9/L (ref 4.5–11.0)

## 2017-03-06 LAB — COMPREHENSIVE METABOLIC PANEL
ALBUMIN: 2.7 g/dL — ABNORMAL LOW (ref 3.5–5.0)
ALKALINE PHOSPHATASE: 102 U/L (ref 38–126)
ALT (SGPT): 20 U/L (ref 15–48)
AST (SGOT): 13 U/L — ABNORMAL LOW (ref 14–38)
BILIRUBIN TOTAL: 0.4 mg/dL (ref 0.0–1.2)
BLOOD UREA NITROGEN: 46 mg/dL — ABNORMAL HIGH (ref 7–21)
CALCIUM: 8.6 mg/dL (ref 8.5–10.2)
CHLORIDE: 116 mmol/L — ABNORMAL HIGH (ref 98–107)
CO2: 20 mmol/L — ABNORMAL LOW (ref 22.0–30.0)
CREATININE: 2.06 mg/dL — ABNORMAL HIGH (ref 0.60–1.00)
EGFR MDRD AF AMER: 31 mL/min/{1.73_m2} — ABNORMAL LOW (ref >=60)
EGFR MDRD NON AF AMER: 26 mL/min/{1.73_m2} — ABNORMAL LOW (ref >=60)
GLUCOSE RANDOM: 166 mg/dL (ref 65–179)
POTASSIUM: 4.1 mmol/L (ref 3.5–5.0)
PROTEIN TOTAL: 5.5 g/dL — ABNORMAL LOW (ref 6.5–8.3)
SODIUM: 141 mmol/L (ref 135–145)

## 2017-03-06 LAB — BILIRUBIN TOTAL: Bilirubin:MCnc:Pt:Ser/Plas:Qn:: 0.4

## 2017-03-06 LAB — ANISOCYTOSIS

## 2017-03-06 LAB — AMYLASE: Chemistry studies:Cmplx:-:^Patient:Set:: 158 — ABNORMAL HIGH

## 2017-03-06 LAB — PHOSPHORUS: Phosphate:MCnc:Pt:Ser/Plas:Qn:: 4

## 2017-03-06 LAB — LIPASE: Triacylglycerol lipase:CCnc:Pt:Ser/Plas:Qn:: 509 — ABNORMAL HIGH

## 2017-03-06 LAB — MAGNESIUM: Magnesium:MCnc:Pt:Ser/Plas:Qn:: 2

## 2017-03-06 NOTE — Unmapped (Signed)
Medicine Daily Progress Note  Interval Events/Subjective:  No acute events overnight, Patient was doing well and has no complaints of fevers, sweating or fatigue. She mentions a mild amount of left eye pain but that this is a chronic issue that she has had in the past and it is quite dull at the moment. She has no SOB or any other notable complaints.     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 *    Rachel [name]??is a 47 y.o.??female??s/p deceased donor renal and pancreas transplant in 2010, history of UTI, history of chronic right foot wound who presented to [facility] from transplant nephrology clinic due to elevated amylase and lipase, concerning for pancreatic rejection, now improving with IV solumedrol. 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 Plan for renal biopsy tomorrow.     AKI 2/2/ acute rejection: Creatinine has been creeping up for the past few years from a baseline of about 1.3-1.4, now >2 with proteinuria. Ultrasound unremarkable. 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  -- Monitor creatinine, avoid nephrotoxins  -- Start Thymoglobulin 150mg  q24h x 7days (1/7- )  -- Bicarb 1950mg  TID  -- CBC w/ Diff daily looking at lymphocyte count   ??  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 Pancreas u/s with patent vasculature. One addl dose of solumedrol tomorrow to complete 3 days. C-peptide and A1C normal.   -- Follow Amylase/Lipase: 158/509 (downtrending nicely)   -- Solumedrol 500mg  x 3 days complete; prednisone 40mg x2days, 20mg x2days, followed by 10mg  daily  -- HLA Ab's: pending  ??  S/p Kidney-Pancreas Transplant: s/p deceased donor kidney and pancreas transplant on 01/31/2009, secondary to Type 1 Diabetes mellitus. Also with prior kidney transplant in 1998 that failed in 2008.????No prior history of rejection or recurrent disease in most recent transplant.??  -- Continue home Myfortic 180 mg BID (while taking Thymoglobulin)   -- Continue home Tacrolimus 5 mg BID  -- Valganciclovir 450mg  Q48h  -- Bactrim 1 tablet (M/W/F)  ??  Cough - improving. Reports almost three weeks of a cough with sinus congestion and post-nasal drip. ??Denies any fevers or chills at home. ??Non-toxic appearing, lungs clear to auscultation. CT chest unremarkable. Plan for azithro x5 days.  -- Azithromycin 500 mg x 1, then 250 mg x 4 days  ??  Metabolic Acidosis:??Bicarb more recently has been 13. ??11 On admission. ??Had not been taking home bicarbonate.  -- Sodium Bicarbonate 1950 mg TID as above  ??  Anemia:??Received IV Iron in September 2018 with improvement in Hemoglobin. ??Hemoglobin stable and at baseline  ??  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 ??Wounds have now all healed. ??Patient follows with orthopedic surgery as an outpatient. ??No increased swelling beyond baseline, no pain, no erythema. ??  ??  Daily Checklist:  Diet:??NPO and Regular Diet??(NPO at  [facility] for bx)  DVT PPx:??hold heparin for bx in AM   GI PPx: PPI  Code Status: Full Code  Dispo:??Admit to MDB  ___________________________________________________________________    Labs/Studies:  Labs and Studies from the last 24hrs per EMR and Reviewed    Objective:  Temp:  [37 ??C-37.6 ??C] 37.5 ??C  Heart Rate:  [86-96] 93  Resp:  [16-18] 16  BP: (121-165)/(58-86) 165/68  SpO2:  [94 %-100 %] 96 %, BP 165/68  - Pulse 93  - Temp 37.5 ??C (Oral)  - Resp 16  - Ht 157.5 cm (5' 2)  - Wt 69.9 kg (154 lb)  - SpO2 96%  - BMI 28.17 kg/m??     General: eating breakfast, appears comfortable and in NAD.   HEENT: Head is atraumatic and normocephalic. Sclera anicteric without injection.   Neck: Supple.  Lungs: Lungs CTA throughout without crackles or wheezing. No coughing.   Heart: Regular rate and rhythm. Soft systolic murmur.   Abdomen: Soft, nontender, nondistended. clean back, no redness or tenderness  Extremities: No clubbing, cyanosis, Swelling Right > Left lower extremity (Chronic issue)      Helyn App, MD  PGY-1 Resident

## 2017-03-06 NOTE — Unmapped (Signed)
Problem: Patient Care Overview  Goal: Plan of Care Review  Outcome: Progressing  Patient has remained free of falls and injuries so far this shift. DVT prevention is ambulation and fluid promotion. All renal meds given as ordered. Vital signs stable. Continuing to monitor.   Goal: Individualization and Mutuality  Outcome: Progressing    Goal: Discharge Needs Assessment  Outcome: Progressing    Goal: Interprofessional Rounds/Family Conf  Outcome: Progressing      Problem: Fall Risk (Adult)  Goal: Absence of Fall  Patient will demonstrate the desired outcomes by discharge/transition of care.   Outcome: Progressing      Problem: Kidney Transplant (Adult)  Goal: Anesthesia/Sedation Recovery  Outcome: Progressing      Problem: Self-Care Deficit (Adult,Obstetrics,Pediatric)  Goal: Improved Ability to Perform BADL and IADL  Patient will demonstrate the desired outcomes by discharge/transition of care.   Outcome: Progressing      Problem: VTE, DVT and PE (Adult)  Goal: Signs and Symptoms of Listed Potential Problems Will be Absent, Minimized or Managed (VTE, DVT and PE)  Signs and symptoms of listed potential problems will be absent, minimized or managed by discharge/transition of care (reference VTE, DVT and PE (Adult) CPG).   Outcome: Progressing

## 2017-03-06 NOTE — Unmapped (Signed)
Problem: Patient Care Overview  Goal: Plan of Care Review  Outcome: Progressing    Goal: Individualization and Mutuality  Outcome: Progressing    Goal: Discharge Needs Assessment  Outcome: Progressing    Goal: Interprofessional Rounds/Family Conf  Outcome: Progressing      Problem: Fall Risk (Adult)  Goal: Absence of Fall  Patient will demonstrate the desired outcomes by discharge/transition of care.   Outcome: Progressing      Problem: Kidney Transplant (Adult)  Goal: Anesthesia/Sedation Recovery  Outcome: Progressing      Problem: Self-Care Deficit (Adult,Obstetrics,Pediatric)  Goal: Improved Ability to Perform BADL and IADL  Patient will demonstrate the desired outcomes by discharge/transition of care.   Outcome: Progressing      Problem: VTE, DVT and PE (Adult)  Goal: Signs and Symptoms of Listed Potential Problems Will be Absent, Minimized or Managed (VTE, DVT and PE)  Signs and symptoms of listed potential problems will be absent, minimized or managed by discharge/transition of care (reference VTE, DVT and PE (Adult) CPG).   Outcome: Progressing      Comments: Pt remain free from fall, able to call for assistance prn, pt tolerated 2/7 thymo, VSS, pt denies pain or other discomfort, no other distress s/s noted, will continue to monitor

## 2017-03-07 LAB — LIPASE: Triacylglycerol lipase:CCnc:Pt:Ser/Plas:Qn:: 618 — ABNORMAL HIGH

## 2017-03-07 LAB — COMPREHENSIVE METABOLIC PANEL
ALKALINE PHOSPHATASE: 81 U/L (ref 38–126)
ALT (SGPT): 20 U/L (ref 15–48)
ANION GAP: 6 mmol/L — ABNORMAL LOW (ref 9–15)
AST (SGOT): 9 U/L — ABNORMAL LOW (ref 14–38)
BILIRUBIN TOTAL: 0.4 mg/dL (ref 0.0–1.2)
BLOOD UREA NITROGEN: 48 mg/dL — ABNORMAL HIGH (ref 7–21)
BUN / CREAT RATIO: 24
CALCIUM: 8.4 mg/dL — ABNORMAL LOW (ref 8.5–10.2)
CHLORIDE: 112 mmol/L — ABNORMAL HIGH (ref 98–107)
CO2: 22 mmol/L (ref 22.0–30.0)
CREATININE: 2.01 mg/dL — ABNORMAL HIGH (ref 0.60–1.00)
EGFR MDRD NON AF AMER: 27 mL/min/{1.73_m2} — ABNORMAL LOW (ref >=60)
GLUCOSE RANDOM: 111 mg/dL (ref 65–179)
POTASSIUM: 3.8 mmol/L (ref 3.5–5.0)
PROTEIN TOTAL: 5 g/dL — ABNORMAL LOW (ref 6.5–8.3)
SODIUM: 140 mmol/L (ref 135–145)

## 2017-03-07 LAB — CBC W/ AUTO DIFF
BASOPHILS ABSOLUTE COUNT: 0 10*9/L (ref 0.0–0.1)
EOSINOPHILS ABSOLUTE COUNT: 0 10*9/L (ref 0.0–0.4)
HEMOGLOBIN: 9.9 g/dL — ABNORMAL LOW (ref 12.0–16.0)
LYMPHOCYTES ABSOLUTE COUNT: 0.3 10*9/L — ABNORMAL LOW (ref 1.5–5.0)
MEAN CORPUSCULAR HEMOGLOBIN: 29.7 pg (ref 26.0–34.0)
MEAN CORPUSCULAR VOLUME: 95.9 fL (ref 80.0–100.0)
MEAN PLATELET VOLUME: 9 fL (ref 7.0–10.0)
MONOCYTES ABSOLUTE COUNT: 0.2 10*9/L (ref 0.2–0.8)
NEUTROPHILS ABSOLUTE COUNT: 6 10*9/L (ref 2.0–7.5)
PLATELET COUNT: 121 10*9/L — ABNORMAL LOW (ref 150–440)
RED BLOOD CELL COUNT: 3.34 10*12/L — ABNORMAL LOW (ref 4.00–5.20)
RED CELL DISTRIBUTION WIDTH: 16 % — ABNORMAL HIGH (ref 12.0–15.0)
WBC ADJUSTED: 6.7 10*9/L (ref 4.5–11.0)

## 2017-03-07 LAB — AMYLASE
AMYLASE: 189 U/L — ABNORMAL HIGH (ref 30–110)
Chemistry studies:Cmplx:-:^Patient:Set:: 189 — ABNORMAL HIGH

## 2017-03-07 LAB — MAGNESIUM: Magnesium:MCnc:Pt:Ser/Plas:Qn:: 1.9

## 2017-03-07 LAB — PHOSPHORUS: Phosphate:MCnc:Pt:Ser/Plas:Qn:: 3.3

## 2017-03-07 LAB — ANION GAP: Anion gap 3:SCnc:Pt:Ser/Plas:Qn:: 6 — ABNORMAL LOW

## 2017-03-07 LAB — NEUTROPHILS ABSOLUTE COUNT: Lab: 6

## 2017-03-07 NOTE — Unmapped (Signed)
Problem: Patient Care Overview  Goal: Plan of Care Review  Outcome: Progressing  Patient denies any pain,vs are stable.Third dose of thymo is infusing,tolerating well.patient is been oob with no problem,will continue to monitor.

## 2017-03-07 NOTE — Unmapped (Signed)
VENOUS ACCESS ULTRASOUND PROCEDURE NOTE    Indications:   Poor venous access.    The Venous Access Team has assessed this patient for the placement of a PIV. Ultrasound guidance was necessary to obtain access.     Procedure Details:  Risks, benefits and alternatives discussed with patient. Identity of the patient was confirmed via name, medical record number and date of birth. The availability of the correct equipment was verified.    The vein was identified for ultrasound catheter insertion.  Field was prepared with necessary supplies and equipment.  Probe cover and sterile gel utilized.  Insertion site was prepped with chlorhexidine solution and allowed to dry.  The catheter extension was primed with normal saline.A(n) 20 g x 2.25 inch (this is a 29 day dwell catheter) catheter was placed in the right forearm with 1 attempt(s).     Catheter aspirated, 3 mL blood return present. The catheter was then flushed with 10 mL of normal saline. Insertion site cleansed, and dressing applied per manufacturer guidelines. The catheter was inserted without difficulty by Gillie Manners RN.    The primary RN was notified.     Thank you,     Gillie Manners RN Venous Access Team   [phone]     Workup / Procedure Time:  30 minutes    See vein image below:

## 2017-03-07 NOTE — Unmapped (Signed)
Problem: Patient Care Overview  Goal: Plan of Care Review  Outcome: Progressing      Comments: Pt alert and oriented. Tolerated day #3 Thymo during the night. No complaints during the night. No falls during the night. Hourly rounds noted. Will continue to monitor.

## 2017-03-07 NOTE — Unmapped (Signed)
Medicine Daily Progress Note  Interval Events/Subjective:  No acute events overnight, Denies CP or SOB, patient feels well and tolerated increase in thymoglobulin well. 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 *      Rachel [name]??is a 47 y.o.??female??s/p deceased donor renal and pancreas transplant in 2010, history of UTI, history of chronic right foot wound who presented to [facility] from transplant nephrology clinic due to elevated amylase and lipase, concerning for pancreatic rejection, now improving with IV solumedrol. 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 Plan for renal biopsy tomorrow.   ??  AKI 2/2/ acute rejection: Creatinine has been creeping up for the past few years from a baseline of about 1.3-1.4, now >2 with proteinuria. Ultrasound unremarkable. 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  -- Monitor creatinine, avoid nephrotoxins  -- Start Thymoglobulin 150mg  q24h x 7days (1/7- )  -- Bicarb 1950mg  TID  -- CBC w/ Diff daily looking at lymphocyte count   ??  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 Pancreas u/s with patent vasculature. One addl dose of solumedrol tomorrow to complete 3 days. C-peptide and A1C normal.   -- Follow Amylase/Lipase: 158/509 (downtrending nicely)   -- Solumedrol 500mg  x 3 days complete; prednisone 40mg x2days, 20mg x2days, followed by 10mg  daily  -- HLA Ab's: pending  ??  S/p Kidney-Pancreas Transplant: s/p deceased donor kidney and pancreas transplant on 01/31/2009, secondary to Type 1 Diabetes mellitus. Also with prior kidney transplant in 1998 that failed in 2008.????No prior history of rejection or recurrent disease in most recent transplant.??  -- Continue home Myfortic 180??mg BID (while taking Thymoglobulin)   -- Continue home Tacrolimus 5??mg BID  -- Valganciclovir 450mg  Q48h  -- Bactrim 1 tablet (M/W/F)  ??  Cough - improving. Reports almost three weeks of a cough with sinus congestion and post-nasal drip. ??Denies any fevers or chills at home. ??Non-toxic appearing, lungs clear to auscultation. CT chest unremarkable. Plan for azithro x5 days.  -- Azithromycin 500 mg x 1, then 250 mg x 4 days  ??  Metabolic Acidosis:??Bicarb more recently has been 13. ??11 On admission. ??Had not been taking home bicarbonate.  -- Sodium Bicarbonate 1950 mg TID as above  ??  Anemia:??Received IV Iron in September 2018 with improvement in Hemoglobin. ??Hemoglobin stable and at baseline  ??  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 ??Wounds have now all healed. ??Patient follows with orthopedic surgery as an outpatient. ??No increased swelling beyond baseline, no pain, no erythema. ??  ??  Daily Checklist:  Diet:??NPO and Regular Diet??(NPO at [facility] for bx)  DVT PPx:??hold heparin for bx in AM   GI PPx: PPI  Code Status: Full Code  Dispo:??Admit to MDB  ___________________________________________________________________    Labs/Studies:  Labs and Studies from  the last 24hrs per EMR and Reviewed    Objective:  Temp:  [37 ??C-37.6 ??C] 37 ??C  Heart Rate:  [76-95] 79  Resp:  [16-18] 18  BP: (124-169)/(64-94) 144/66  SpO2:  [96 %-99 %] 99 %, BP 144/66  - Pulse 79  - Temp 37 ??C (Oral)  - Resp 18  - Ht 157.5 cm (5' 2)  - Wt 69.9 kg (154 lb)  - SpO2 99%  - BMI 28.17 kg/m??   ??  General: eating breakfast, appears comfortable and in NAD.   HEENT: Head is atraumatic and normocephalic. Sclera anicteric without injection.   Neck: Supple.  Lungs: Lungs CTA throughout without crackles or wheezing. No coughing.   Heart: Regular rate and rhythm. Soft systolic murmur.   Abdomen: Soft, nontender, nondistended. clean back, no redness or tenderness  Extremities: No clubbing, cyanosis, Swelling Right > Left lower extremity (Chronic issue)    Helyn App, MD  PGY-1 Resident

## 2017-03-08 LAB — CBC W/ AUTO DIFF
BASOPHILS ABSOLUTE COUNT: 0 10*9/L (ref 0.0–0.1)
EOSINOPHILS ABSOLUTE COUNT: 0 10*9/L (ref 0.0–0.4)
HEMATOCRIT: 30.8 % — ABNORMAL LOW (ref 36.0–46.0)
HEMOGLOBIN: 9.5 g/dL — ABNORMAL LOW (ref 12.0–16.0)
LARGE UNSTAINED CELLS: 1 % (ref 0–4)
LYMPHOCYTES ABSOLUTE COUNT: 0.2 10*9/L — ABNORMAL LOW (ref 1.5–5.0)
MEAN CORPUSCULAR HEMOGLOBIN CONC: 30.8 g/dL — ABNORMAL LOW (ref 31.0–37.0)
MEAN CORPUSCULAR VOLUME: 95.9 fL (ref 80.0–100.0)
MEAN PLATELET VOLUME: 9.6 fL (ref 7.0–10.0)
MONOCYTES ABSOLUTE COUNT: 0.2 10*9/L (ref 0.2–0.8)
PLATELET COUNT: 111 10*9/L — ABNORMAL LOW (ref 150–440)
RED BLOOD CELL COUNT: 3.21 10*12/L — ABNORMAL LOW (ref 4.00–5.20)
RED CELL DISTRIBUTION WIDTH: 15.9 % — ABNORMAL HIGH (ref 12.0–15.0)
WBC ADJUSTED: 7.3 10*9/L (ref 4.5–11.0)

## 2017-03-08 LAB — COMPREHENSIVE METABOLIC PANEL
ALBUMIN: 2.5 g/dL — ABNORMAL LOW (ref 3.5–5.0)
ALKALINE PHOSPHATASE: 81 U/L (ref 38–126)
ALT (SGPT): 22 U/L (ref 15–48)
ANION GAP: 6 mmol/L — ABNORMAL LOW (ref 9–15)
AST (SGOT): 13 U/L — ABNORMAL LOW (ref 14–38)
BILIRUBIN TOTAL: 0.4 mg/dL (ref 0.0–1.2)
BLOOD UREA NITROGEN: 46 mg/dL — ABNORMAL HIGH (ref 7–21)
CALCIUM: 8.4 mg/dL — ABNORMAL LOW (ref 8.5–10.2)
CHLORIDE: 115 mmol/L — ABNORMAL HIGH (ref 98–107)
CO2: 20 mmol/L — ABNORMAL LOW (ref 22.0–30.0)
CREATININE: 1.93 mg/dL — ABNORMAL HIGH (ref 0.60–1.00)
EGFR MDRD AF AMER: 34 mL/min/{1.73_m2} — ABNORMAL LOW (ref >=60)
EGFR MDRD NON AF AMER: 28 mL/min/{1.73_m2} — ABNORMAL LOW (ref >=60)
GLUCOSE RANDOM: 206 mg/dL — ABNORMAL HIGH (ref 65–179)
POTASSIUM: 3.6 mmol/L (ref 3.5–5.0)
PROTEIN TOTAL: 5 g/dL — ABNORMAL LOW (ref 6.5–8.3)
SODIUM: 141 mmol/L (ref 135–145)

## 2017-03-08 LAB — SMEAR REVIEW

## 2017-03-08 LAB — TACROLIMUS BLOOD
Lab: 7.5
Lab: 7.6

## 2017-03-08 LAB — MAGNESIUM: Magnesium:MCnc:Pt:Ser/Plas:Qn:: 1.9

## 2017-03-08 LAB — LIPASE: Triacylglycerol lipase:CCnc:Pt:Ser/Plas:Qn:: 539 — ABNORMAL HIGH

## 2017-03-08 LAB — PHOSPHORUS: Phosphate:MCnc:Pt:Ser/Plas:Qn:: 2.7 — ABNORMAL LOW

## 2017-03-08 LAB — CHLORIDE: Chloride:SCnc:Pt:Ser/Plas:Qn:: 115 — ABNORMAL HIGH

## 2017-03-08 LAB — AMYLASE: Chemistry studies:Cmplx:-:^Patient:Set:: 145 — ABNORMAL HIGH

## 2017-03-08 LAB — HEMATOCRIT: Lab: 30.8 — ABNORMAL LOW

## 2017-03-08 NOTE — Unmapped (Signed)
Problem: Patient Care Overview  Goal: Plan of Care Review  Outcome: Progressing      Comments: Pt alert and oriented. Tolerated  Thymo day 4.  Denies pain.  No falls during the night. Hourly rounds noted. Will continue to monitor.

## 2017-03-08 NOTE — Unmapped (Signed)
Problem: Patient Care Overview  Goal: Plan of Care Review  Outcome: Progressing  Patient denies any pain today,vs are stable,she's on her 4th dose of thymo,tolerating well.Patient is been ambulating in the room with no problem,will continue to monitor.

## 2017-03-08 NOTE — Unmapped (Signed)
Medicine Daily Progress Note  Interval Events/Subjective:  No acute events overnight, Denies CP or SOB, Pt denies fevers or issues. She mentions a few episodes of diarrhea starting this morning. Somewhat loose. She mentions no blood in the stools.  By the time of rounds patient felt that these have resolved.     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 *    Angi Harpster??is a 47 y.o.??female??s/p deceased donor renal and pancreas transplant in 2010, history of UTI, history of chronic right foot wound who presented to [facility] from transplant nephrology clinic due to elevated amylase and lipase, concerning for pancreatic rejection, now improving with IV solumedrol. 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 Plan for renal biopsy tomorrow.   ??  AKI 2/2/ acute rejection: Creatinine has been creeping up for the past few years from a baseline of about 1.3-1.4, now >2 with proteinuria. Ultrasound unremarkable. 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  -- Monitor creatinine, avoid nephrotoxins  -- Start Thymoglobulin 150mg  q24h x 7days (1/7- )  -- Bicarb 1950mg  TID  -- CBC w/ Diff daily looking at lymphocyte count   ??  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 Pancreas u/s with patent vasculature. One addl dose of solumedrol tomorrow to complete 3 days. C-peptide and A1C normal.   -- Follow Amylase/Lipase: 158/509 (downtrending nicely)   -- Solumedrol 500mg  x 3 days complete; prednisone 40mg x2days, 20mg x2days, followed by 10mg  daily  -- HLA Ab's: pending  ??  S/p Kidney-Pancreas Transplant: s/p deceased donor kidney and pancreas transplant on 01/31/2009, secondary to Type 1 Diabetes mellitus. Also with prior kidney transplant in 1998 that failed in 2008.????No prior history of rejection or recurrent disease in most recent transplant.??  -- Continue home Myfortic 180??mg BID (while taking Thymoglobulin)   -- Continue home Tacrolimus 5??mg BID  -- Valganciclovir 450mg  Q48h  -- Bactrim 1 tablet (M/W/F)  ??  Cough - improving. Reports almost three weeks of a cough with sinus congestion and post-nasal drip. ??Denies any fevers or chills at home. ??Non-toxic appearing, lungs clear to auscultation. CT chest unremarkable. Plan for azithro x5 days.  -- Azithromycin 500 mg x 1, then 250 mg x 4 days  ??  Metabolic Acidosis:??Bicarb more recently has been 13. ??11 On admission. ??Had not been taking home bicarbonate.  -- Sodium Bicarbonate 1950 mg TID as above  ??  Anemia:??Received IV Iron in September 2018 with improvement in Hemoglobin. ??Hemoglobin stable and at baseline  ??  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 ??Wounds have now all healed. ??Patient follows with orthopedic surgery as an outpatient. ??No increased swelling beyond baseline, no pain, no erythema. ??  ??  Daily Checklist:  Diet: Regular Diet  DVT PPx: Heparin 5000mg  q8h   GI PPx: PPI  Code Status: Full Code  Dispo: Admit to MDB  ___________________________________________________________________    Labs/Studies:  Labs and Studies from the last 24hrs per EMR and Reviewed    Objective:  Temp:  [37.1 ??C-37.8 ??C] 37.8 ??C  Heart Rate:  [90-100] 100  Resp:  [16-18] 18  BP: (107-164)/(57-78) 148/57  SpO2:  [97 %-100 %] 98 %, BP 148/57  - Pulse 100  - Temp 37.8 ??C (Oral)  - Resp 18  - Ht 157.5 cm (5' 2)  - Wt 69.9 kg (154 lb)  - SpO2 98%  - BMI 28.17 kg/m??     General: appears comfortable and in NAD.   HEENT: Head is atraumatic and normocephalic. Sclera anicteric without injection.   Neck: Supple.  Lungs: Lungs CTA throughout without crackles or wheezing. No coughing.   Heart: Regular rate and rhythm. Soft systolic murmur.   Abdomen: Soft, nontender, nondistended. clean back, no redness or tenderness  Extremities: No clubbing, cyanosis, Swelling Right > Left lower extremity (Chronic issue)    Helyn App, MD  PGY-1 Resident

## 2017-03-09 LAB — CBC W/ AUTO DIFF
BASOPHILS ABSOLUTE COUNT: 0 10*9/L (ref 0.0–0.1)
EOSINOPHILS ABSOLUTE COUNT: 0.1 10*9/L (ref 0.0–0.4)
HEMATOCRIT: 30.1 % — ABNORMAL LOW (ref 36.0–46.0)
LARGE UNSTAINED CELLS: 1 % (ref 0–4)
LYMPHOCYTES ABSOLUTE COUNT: 0.2 10*9/L — ABNORMAL LOW (ref 1.5–5.0)
MEAN CORPUSCULAR HEMOGLOBIN CONC: 31.1 g/dL (ref 31.0–37.0)
MEAN CORPUSCULAR HEMOGLOBIN: 29.6 pg (ref 26.0–34.0)
MEAN CORPUSCULAR VOLUME: 95.2 fL (ref 80.0–100.0)
MEAN PLATELET VOLUME: 11.2 fL — ABNORMAL HIGH (ref 7.0–10.0)
MONOCYTES ABSOLUTE COUNT: 0.2 10*9/L (ref 0.2–0.8)
NEUTROPHILS ABSOLUTE COUNT: 4.4 10*9/L (ref 2.0–7.5)
RED BLOOD CELL COUNT: 3.16 10*12/L — ABNORMAL LOW (ref 4.00–5.20)
RED CELL DISTRIBUTION WIDTH: 15.9 % — ABNORMAL HIGH (ref 12.0–15.0)
WBC ADJUSTED: 4.9 10*9/L (ref 4.5–11.0)

## 2017-03-09 LAB — COMPREHENSIVE METABOLIC PANEL
ALBUMIN: 2.3 g/dL — ABNORMAL LOW (ref 3.5–5.0)
ALT (SGPT): 18 U/L (ref 15–48)
ANION GAP: 4 mmol/L — ABNORMAL LOW (ref 9–15)
AST (SGOT): 11 U/L — ABNORMAL LOW (ref 14–38)
BILIRUBIN TOTAL: 0.4 mg/dL (ref 0.0–1.2)
BUN / CREAT RATIO: 23
CALCIUM: 8.2 mg/dL — ABNORMAL LOW (ref 8.5–10.2)
CHLORIDE: 113 mmol/L — ABNORMAL HIGH (ref 98–107)
CO2: 22 mmol/L (ref 22.0–30.0)
CREATININE: 1.66 mg/dL — ABNORMAL HIGH (ref 0.60–1.00)
EGFR MDRD AF AMER: 40 mL/min/{1.73_m2} — ABNORMAL LOW (ref >=60)
EGFR MDRD NON AF AMER: 33 mL/min/{1.73_m2} — ABNORMAL LOW (ref >=60)
GLUCOSE RANDOM: 109 mg/dL (ref 65–179)
POTASSIUM: 3.9 mmol/L (ref 3.5–5.0)
PROTEIN TOTAL: 4.6 g/dL — ABNORMAL LOW (ref 6.5–8.3)
SODIUM: 139 mmol/L (ref 135–145)

## 2017-03-09 LAB — CO2: Carbon dioxide:SCnc:Pt:Ser/Plas:Qn:: 22

## 2017-03-09 LAB — TACROLIMUS BLOOD: Lab: 11.2

## 2017-03-09 LAB — PHOSPHORUS: Phosphate:MCnc:Pt:Ser/Plas:Qn:: 3.2

## 2017-03-09 LAB — AMYLASE: Chemistry studies:Cmplx:-:^Patient:Set:: 81

## 2017-03-09 LAB — MAGNESIUM: Magnesium:MCnc:Pt:Ser/Plas:Qn:: 1.7

## 2017-03-09 LAB — MEAN CORPUSCULAR HEMOGLOBIN CONC: Lab: 31.1

## 2017-03-09 LAB — LIPASE: Triacylglycerol lipase:CCnc:Pt:Ser/Plas:Qn:: 233 — ABNORMAL HIGH

## 2017-03-09 NOTE — Unmapped (Signed)
Order was placed for PIV by Venous Access Team (VAT).  Patient was assessed for placement of a PIV. Access was obtained. Blood return noted.  Dressing intact and device well secured.  Flushed with normal saline.  Pt advised to inform RN of any s/s of discomfort at the PIV site.    Workup / Procedure Time:  30 minutes      The primary RN was notified.       Thank you,     Madelyn Brunner RN Venous Access Team

## 2017-03-09 NOTE — Unmapped (Signed)
Medicine Daily Progress Note  Interval Events/Subjective:  No acute events overnight, Denies CP or SOB, Tolerating diet without difficulty, patient states that her diarrhea has resolved and that she has not felt febrile nor chills.     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 *      Rachel [name]??is a 47 y.o.??female??s/p deceased donor renal and pancreas transplant in 2010, history of UTI, history of chronic right foot wound who presented to [facility] from transplant nephrology clinic due to elevated amylase and lipase, concerning for pancreatic rejection, now improving with IV solumedrol. 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 Plan for renal biopsy tomorrow.   ??  AKI 2/2/ acute rejection: Creatinine has been creeping up for the past few years from a baseline of about 1.3-1.4, now >2 with proteinuria. Ultrasound unremarkable. 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  -- Monitor creatinine, avoid nephrotoxins  -- Start Thymoglobulin 150mg  q24h x 7days (1/7- 1/16)  -- Bicarb 1950mg  TID  -- CBC w/ Diff daily looking at lymphocyte count   ??  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 Pancreas u/s with patent vasculature. One addl dose of solumedrol tomorrow to complete 3 days. C-peptide and A1C normal.   -- Follow Amylase/Lipase: 158/509 (downtrending nicely)   -- Solumedrol 500mg  x 3 days complete; prednisone 40mg x2days, 20mg x2days, followed by 10mg  daily  -- HLA Ab's: pending  ??  S/p Kidney-Pancreas Transplant: s/p deceased donor kidney and pancreas transplant on 01/31/2009, secondary to Type 1 Diabetes mellitus. Also with prior kidney transplant in 1998 that failed in 2008.????No prior history of rejection or recurrent disease in most recent transplant.??  -- Continue home Myfortic 180??mg BID (while taking Thymoglobulin)   -- Continue home Tacrolimus 5??mg BID  -- Valganciclovir 450mg  Q48h  -- Bactrim 1 tablet (M/W/F)  ??  Cough - improving. Reports almost three weeks of a cough with sinus congestion and post-nasal drip. ??Denies any fevers or chills at home. ??Non-toxic appearing, lungs clear to auscultation. CT chest unremarkable. Plan for azithro x5 days.  -- Azithromycin 500 mg x 1, then 250 mg x 4 days  ??  Metabolic Acidosis:??Bicarb more recently has been 13. ??11 On admission. ??Had not been taking home bicarbonate.  -- Sodium Bicarbonate 1950 mg TID as above  ??  Anemia:??Received IV Iron in September 2018 with improvement in Hemoglobin. ??Hemoglobin stable and at baseline  ??  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 ??Wounds have now all healed. ??Patient follows with orthopedic surgery as an outpatient. ??No increased swelling beyond baseline, no pain, no erythema. ??  ??  Daily Checklist:  Diet: Regular Diet  DVT PPx: Heparin 5000mg  q8h   GI PPx: PPI  Code Status: Full Code  Dispo: Admit to MDB  ___________________________________________________________________    Labs/Studies:  Labs and Studies  from the last 24hrs per EMR and Reviewed    Objective:  Temp:  [37.1 ??C-38 ??C] 37.1 ??C  Heart Rate:  [89-98] 89  Resp:  [16] 16  BP: (123-156)/(55-75) 152/75  SpO2:  [97 %-99 %] 98 %, BP 152/75  - Pulse 89  - Temp 37.1 ??C (Oral)  - Resp 16  - Ht 157.5 cm (5' 2)  - Wt 69.9 kg (154 lb)  - SpO2 98%  - BMI 28.17 kg/m??     General: appears comfortable and in NAD.   HEENT: Head is atraumatic and normocephalic. Sclera anicteric without injection.   Neck: Supple.  Lungs: Lungs CTA throughout without crackles or wheezing. No coughing.   Heart: Regular rate and rhythm. Soft systolic murmur.   Abdomen: Soft, nontender, nondistended. clean back, no redness or tenderness  Extremities: No clubbing, cyanosis, Swelling Right > Left lower extremity (Chronic issue)      Helyn App, MD  PGY-1 Resident

## 2017-03-09 NOTE — Unmapped (Signed)
Problem: Patient Care Overview  Goal: Plan of Care Review  Outcome: Progressing  Patient has remained free from falls and injuries; independent with ADL's. VSS. Patient has no complaints of pain. Completed 5th Thymo treatment. Patient tolerated well. She is safe with call bell and personal items within reach. Will continue to monitor closely.   Goal: Individualization and Mutuality  Outcome: Progressing    Goal: Discharge Needs Assessment  Outcome: Progressing    Goal: Interprofessional Rounds/Family Conf  Outcome: Progressing      Problem: Fall Risk (Adult)  Goal: Absence of Fall  Patient will demonstrate the desired outcomes by discharge/transition of care.   Outcome: Progressing      Problem: Kidney Transplant (Adult)  Goal: Signs and Symptoms of Listed Potential Problems Will be Absent, Minimized or Managed (Kidney Transplant)  Signs and symptoms of listed potential problems will be absent, minimized or managed by discharge/transition of care (reference Kidney Transplant (Adult) CPG).    Outcome: Progressing    Goal: Anesthesia/Sedation Recovery  Outcome: Progressing      Problem: Self-Care Deficit (Adult,Obstetrics,Pediatric)  Goal: Improved Ability to Perform BADL and IADL  Patient will demonstrate the desired outcomes by discharge/transition of care.   Outcome: Progressing      Problem: VTE, DVT and PE (Adult)  Goal: Signs and Symptoms of Listed Potential Problems Will be Absent, Minimized or Managed (VTE, DVT and PE)  Signs and symptoms of listed potential problems will be absent, minimized or managed by discharge/transition of care (reference VTE, DVT and PE (Adult) CPG).   Outcome: Progressing

## 2017-03-09 NOTE — Unmapped (Signed)
Problem: Patient Care Overview  Goal: Plan of Care Review  Outcome: Progressing    Goal: Individualization and Mutuality  Outcome: Progressing  Pt free of falls. Mild fever,provider notified and tylenol provided. All other VS stable. Thyroglobulin provided per order, pt tolerated well. VTE risk manged with ambulation.Will continue to monitor.   Goal: Discharge Needs Assessment  Outcome: Progressing      Problem: Fall Risk (Adult)  Goal: Absence of Fall  Patient will demonstrate the desired outcomes by discharge/transition of care.   Outcome: Progressing      Problem: Kidney Transplant (Adult)  Goal: Signs and Symptoms of Listed Potential Problems Will be Absent, Minimized or Managed (Kidney Transplant)  Signs and symptoms of listed potential problems will be absent, minimized or managed by discharge/transition of care (reference Kidney Transplant (Adult) CPG).   Outcome: Progressing    Goal: Anesthesia/Sedation Recovery  Outcome: Progressing      Problem: Self-Care Deficit (Adult,Obstetrics,Pediatric)  Goal: Improved Ability to Perform BADL and IADL  Patient will demonstrate the desired outcomes by discharge/transition of care.   Outcome: Progressing      Problem: VTE, DVT and PE (Adult)  Goal: Signs and Symptoms of Listed Potential Problems Will be Absent, Minimized or Managed (VTE, DVT and PE)  Signs and symptoms of listed potential problems will be absent, minimized or managed by discharge/transition of care (reference VTE, DVT and PE (Adult) CPG).   Outcome: Progressing

## 2017-03-10 LAB — MONOCYTES ABSOLUTE COUNT: Lab: 0.1 — ABNORMAL LOW

## 2017-03-10 LAB — PHOSPHORUS: Phosphate:MCnc:Pt:Ser/Plas:Qn:: 2.6 — ABNORMAL LOW

## 2017-03-10 LAB — CBC W/ AUTO DIFF
BASOPHILS ABSOLUTE COUNT: 0 10*9/L (ref 0.0–0.1)
EOSINOPHILS ABSOLUTE COUNT: 0.2 10*9/L (ref 0.0–0.4)
HEMATOCRIT: 28.2 % — ABNORMAL LOW (ref 36.0–46.0)
HEMOGLOBIN: 8.8 g/dL — ABNORMAL LOW (ref 12.0–16.0)
LARGE UNSTAINED CELLS: 1 % (ref 0–4)
LYMPHOCYTES ABSOLUTE COUNT: 0.2 10*9/L — ABNORMAL LOW (ref 1.5–5.0)
MEAN CORPUSCULAR HEMOGLOBIN CONC: 31.4 g/dL (ref 31.0–37.0)
MEAN CORPUSCULAR VOLUME: 94.7 fL (ref 80.0–100.0)
MONOCYTES ABSOLUTE COUNT: 0.1 10*9/L — ABNORMAL LOW (ref 0.2–0.8)
NEUTROPHILS ABSOLUTE COUNT: 4.8 10*9/L (ref 2.0–7.5)
PLATELET COUNT: 84 10*9/L — ABNORMAL LOW (ref 150–440)
RED CELL DISTRIBUTION WIDTH: 15.8 % — ABNORMAL HIGH (ref 12.0–15.0)
WBC ADJUSTED: 5.3 10*9/L (ref 4.5–11.0)

## 2017-03-10 LAB — COMPREHENSIVE METABOLIC PANEL
ALBUMIN: 2.2 g/dL — ABNORMAL LOW (ref 3.5–5.0)
ALKALINE PHOSPHATASE: 75 U/L (ref 38–126)
ALT (SGPT): 25 U/L (ref 15–48)
ANION GAP: 6 mmol/L — ABNORMAL LOW (ref 9–15)
AST (SGOT): 13 U/L — ABNORMAL LOW (ref 14–38)
BILIRUBIN TOTAL: 0.3 mg/dL (ref 0.0–1.2)
BLOOD UREA NITROGEN: 38 mg/dL — ABNORMAL HIGH (ref 7–21)
BUN / CREAT RATIO: 25
CALCIUM: 8.2 mg/dL — ABNORMAL LOW (ref 8.5–10.2)
CHLORIDE: 112 mmol/L — ABNORMAL HIGH (ref 98–107)
CO2: 22 mmol/L (ref 22.0–30.0)
CREATININE: 1.54 mg/dL — ABNORMAL HIGH (ref 0.60–1.00)
EGFR MDRD NON AF AMER: 36 mL/min/{1.73_m2} — ABNORMAL LOW (ref >=60)
GLUCOSE RANDOM: 148 mg/dL (ref 65–179)
POTASSIUM: 4.1 mmol/L (ref 3.5–5.0)
PROTEIN TOTAL: 4.5 g/dL — ABNORMAL LOW (ref 6.5–8.3)
SODIUM: 140 mmol/L (ref 135–145)

## 2017-03-10 LAB — LIPASE: Triacylglycerol lipase:CCnc:Pt:Ser/Plas:Qn:: 319 — ABNORMAL HIGH

## 2017-03-10 LAB — EGFR MDRD AF AMER
Glomerular filtration rate/1.73 sq M.predicted.black:ArVRat:Pt:Ser/Plas/Bld:Qn:Creatinine-based formula (MDRD): 44 — ABNORMAL LOW

## 2017-03-10 LAB — TACROLIMUS, TROUGH: Lab: 6.7

## 2017-03-10 LAB — AMYLASE: Chemistry studies:Cmplx:-:^Patient:Set:: 86

## 2017-03-10 LAB — MAGNESIUM: Magnesium:MCnc:Pt:Ser/Plas:Qn:: 1.7

## 2017-03-10 NOTE — Unmapped (Signed)
Problem: Patient Care Overview  Goal: Plan of Care Review  Outcome: Progressing  Patient's vs are stable denies any pain,ambulating in the room with no issues,pt is not compliant with intake and output,everytime this nurse checks the bathroom hat is off the toilet,reeducated.Sixth dose of thymo is currently infusing,tolerating well.

## 2017-03-10 NOTE — Unmapped (Signed)
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  ??  AKI 2/2/ acute rejection: Creatinine has been creeping up for the past few years from a baseline of about 1.3-1.4, now >2 with proteinuria. Ultrasound unremarkable. 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  -- Monitor creatinine, avoid nephrotoxins  -- Start Thymoglobulin 150mg  q24h x 10days (1/7- 1/16)  -- Bicarb 1950mg  TID  -- CBC w/ Diff daily looking at lymphocyte count   ??  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 Pancreas u/s with patent vasculature. One addl dose of solumedrol tomorrow to complete 3 days. C-peptide and A1C normal.   -- Follow Amylase/Lipase: 86/319 (downtrending nicely)   -- Solumedrol 500mg  x 3 days complete; prednisone 40mg x2days, 20mg x2days, followed by 10mg  daily  ??  S/p Kidney-Pancreas Transplant: s/p deceased donor kidney and pancreas transplant on 01/31/2009, secondary to Type 1 Diabetes mellitus. Also with prior kidney transplant in 1998 that failed in 2008.????No prior history of rejection or recurrent disease in most recent transplant.??  -- Continue home Myfortic 180??mg BID (while taking Thymoglobulin)   -- Continue home Tacrolimus 5??mg BID  -- Valganciclovir 450mg  Q48h  -- Bactrim 1 tablet (M/W/F)  ??  Cough(resolved). Reports almost three weeks of a cough with sinus congestion and post-nasal drip. ??Denies any fevers or chills at home. ??Non-toxic appearing, lungs clear to auscultation. CT chest unremarkable. Plan for azithro x5 days.  -- Azithromycin 500 mg x 1, then 250 mg x 4 days  Metabolic Acidosis (resolved):??Bicarb more recently has been 13. ??11 On admission. ??Had not been taking home bicarbonate.  -- Sodium Bicarbonate 1950 mg TID as above  Anemia:??Received IV Iron in September 2018 with improvement in Hemoglobin. ??Hemoglobin stable and at baseline  History of Right Foot wound/Osteomeylitis/s/p Revision TTC fusion nail of right ankle: Right ankle chronically more swollen and immobile compared to left. ??Wounds have now all healed. ??Patient follows with orthopedic surgery as an outpatient. ??No increased swelling beyond baseline, no pain, no erythema. ??  ??  Daily Checklist:  Diet:??Regular Diet  DVT PPx:??Heparin 5000mg  q8h??  GI PPx: PPI  Code Status: Full Code  Dispo:??Admit to MDB  ___________________________________________________________________    Labs/Studies:  Labs and Studies from the last 24hrs per EMR and Reviewed    Objective:  Temp:  [37.3 ??C-37.5 ??C] 37.3 ??C  Heart Rate:  [90-101] 95  Resp:  [16-18] 16  BP: (120-150)/(46-77) 130/46  SpO2:  [94 %-97 %] 96 %, BP  130/46  - Pulse 95  - Temp 37.3 ??C (Oral)  - Resp 16  - Ht 157.5 cm (5' 2)  - Wt 69.8 kg (153 lb 14.1 oz)  - SpO2 96%  - BMI 28.15 kg/m??     General: appears comfortable and in NAD.   HEENT: Head is atraumatic and normocephalic. Sclera anicteric without injection.   Neck: Supple.  Lungs: Lungs CTA throughout without crackles or wheezing. No coughing.   Heart: Regular rate and rhythm. Soft systolic murmur.   Abdomen: Soft, nontender, nondistended. clean back, no redness or tenderness  Extremities: No clubbing, cyanosis, Swelling Right > Left lower extremity (Chronic issue)  ??  Helyn App, MD  PGY-1 Resident

## 2017-03-11 LAB — COMPREHENSIVE METABOLIC PANEL
ALBUMIN: 2.2 g/dL — ABNORMAL LOW (ref 3.5–5.0)
ALKALINE PHOSPHATASE: 71 U/L (ref 38–126)
ALT (SGPT): 23 U/L (ref 15–48)
ANION GAP: 5 mmol/L — ABNORMAL LOW (ref 9–15)
AST (SGOT): 12 U/L — ABNORMAL LOW (ref 14–38)
BLOOD UREA NITROGEN: 32 mg/dL — ABNORMAL HIGH (ref 7–21)
BUN / CREAT RATIO: 23
CALCIUM: 8.1 mg/dL — ABNORMAL LOW (ref 8.5–10.2)
CHLORIDE: 112 mmol/L — ABNORMAL HIGH (ref 98–107)
CO2: 24 mmol/L (ref 22.0–30.0)
CREATININE: 1.4 mg/dL — ABNORMAL HIGH (ref 0.60–1.00)
EGFR MDRD AF AMER: 49 mL/min/{1.73_m2} — ABNORMAL LOW (ref >=60)
EGFR MDRD NON AF AMER: 40 mL/min/{1.73_m2} — ABNORMAL LOW (ref >=60)
GLUCOSE RANDOM: 120 mg/dL (ref 65–179)
PROTEIN TOTAL: 4.6 g/dL — ABNORMAL LOW (ref 6.5–8.3)
SODIUM: 141 mmol/L (ref 135–145)

## 2017-03-11 LAB — SMEAR REVIEW

## 2017-03-11 LAB — MAGNESIUM: Magnesium:MCnc:Pt:Ser/Plas:Qn:: 1.6

## 2017-03-11 LAB — CBC W/ AUTO DIFF
EOSINOPHILS ABSOLUTE COUNT: 0.2 10*9/L (ref 0.0–0.4)
HEMOGLOBIN: 8.8 g/dL — ABNORMAL LOW (ref 12.0–16.0)
LARGE UNSTAINED CELLS: 1 % (ref 0–4)
LYMPHOCYTES ABSOLUTE COUNT: 0.2 10*9/L — ABNORMAL LOW (ref 1.5–5.0)
MEAN CORPUSCULAR HEMOGLOBIN CONC: 30.5 g/dL — ABNORMAL LOW (ref 31.0–37.0)
MEAN CORPUSCULAR HEMOGLOBIN: 29.4 pg (ref 26.0–34.0)
MEAN CORPUSCULAR VOLUME: 96.6 fL (ref 80.0–100.0)
MEAN PLATELET VOLUME: 10 fL (ref 7.0–10.0)
MONOCYTES ABSOLUTE COUNT: 0.2 10*9/L (ref 0.2–0.8)
NEUTROPHILS ABSOLUTE COUNT: 5.3 10*9/L (ref 2.0–7.5)
PLATELET COUNT: 110 10*9/L — ABNORMAL LOW (ref 150–440)
RED BLOOD CELL COUNT: 2.99 10*12/L — ABNORMAL LOW (ref 4.00–5.20)
WBC ADJUSTED: 5.9 10*9/L (ref 4.5–11.0)

## 2017-03-11 LAB — TACROLIMUS, TROUGH: Lab: 9

## 2017-03-11 LAB — PHOSPHORUS: Phosphate:MCnc:Pt:Ser/Plas:Qn:: 3

## 2017-03-11 LAB — BILIRUBIN TOTAL: Bilirubin:MCnc:Pt:Ser/Plas:Qn:: 0.4

## 2017-03-11 LAB — LIPASE: Triacylglycerol lipase:CCnc:Pt:Ser/Plas:Qn:: 242 — ABNORMAL HIGH

## 2017-03-11 LAB — AMYLASE: Chemistry studies:Cmplx:-:^Patient:Set:: 76

## 2017-03-11 LAB — MONOCYTES ABSOLUTE COUNT: Lab: 0.2

## 2017-03-11 NOTE — Unmapped (Signed)
Problem: Patient Care Overview  Goal: Plan of Care Review  Outcome: Progressing   03/11/17 1353   OTHER   Plan of Care Reviewed With patient       Problem: Fall Risk (Adult)  Goal: Absence of Fall  Patient will demonstrate the desired outcomes by discharge/transition of care.   Outcome: Progressing   03/11/17 1353   Fall Risk (Adult)   Absence of Fall making progress toward outcome       Problem: Self-Care Deficit (Adult,Obstetrics,Pediatric)  Intervention: Promote Patient Activity Focusing on Functional Independence  Pt is alert and Oriented-on room air.POc Updated with the pt at the bed side and all Qs Answered.Pt receiving thymo No 8 of 10 Infusing to RFA PIV with no Difficulties-VSS monitored closely.as remain free from any Falls and Falls Precautions in place maintained.performed her ADL self,will cont to monitor.    Goal: Improved Ability to Perform BADL and IADL  Patient will demonstrate the desired outcomes by discharge/transition of care.   Outcome: Progressing   03/11/17 1353   Self-Care Deficit (Adult,Obstetrics,Pediatric)   Improved Ability to Perform BADL and IADL making progress toward outcome

## 2017-03-11 NOTE — Unmapped (Signed)
Medicine Daily Progress Note  Interval Events/Subjective:  No acute events overnight, Denies CP or SOB, Patient continues to do very well and feel well. She has had no subjective fevers or increased fatigue.     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  ??  AKI 2/2/ acute rejection: Creatinine has been creeping up for the past few years from a baseline of about 1.3-1.4, now >2 with proteinuria. Ultrasound unremarkable. 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  -- Monitor creatinine, avoid nephrotoxins  -- Start Thymoglobulin 150mg  q24h x 10days (1/7- 1/16)  -- Bicarb 1950mg  TID  -- CBC w/ Diff daily looking at lymphocyte count   ??  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 Pancreas u/s with patent vasculature. One addl dose of solumedrol tomorrow to complete 3 days. C-peptide and A1C normal.   -- Follow Amylase/Lipase: 76/242 (downtrending nicely)   -- Solumedrol 500mg  x 3 days complete; prednisone 40mg x2days, 20mg x2days, followed by 10mg  daily  ??  S/p Kidney-Pancreas Transplant: s/p deceased donor kidney and pancreas transplant on 01/31/2009, secondary to Type 1 Diabetes mellitus. Also with prior kidney transplant in 1998 that failed in 2008.????No prior history of rejection or recurrent disease in most recent transplant.??  -- Continue home Myfortic 180??mg BID (while taking Thymoglobulin)   -- Continue home Tacrolimus 5??mg BID  -- Valganciclovir 450mg  Q48h  -- Bactrim 1 tablet (M/W/F)  ??  Cough(resolved). Reports almost three weeks of a cough with sinus congestion and post-nasal drip. ??Denies any fevers or chills at home. ??Non-toxic appearing, lungs clear to auscultation. CT chest unremarkable. Plan for azithro x5 days.  -- Azithromycin 500 mg x 1, then 250 mg x 4 days  Metabolic Acidosis (resolved):??Bicarb more recently has been 13. ??11 On admission. ??Had not been taking home bicarbonate.  -- Sodium Bicarbonate 1950 mg TID as above  Anemia:??Received IV Iron in September 2018 with improvement in Hemoglobin. ??Hemoglobin stable and at baseline  History of Right Foot wound/Osteomeylitis/s/p Revision TTC fusion nail of right ankle: Right ankle chronically more swollen and immobile compared to left. ??Wounds have now all healed. ??Patient follows with orthopedic surgery as an outpatient. ??No increased swelling beyond baseline, no pain, no erythema. ??  ??  Daily Checklist:  Diet:??Regular Diet  DVT PPx:??Heparin 5000mg  q8h??  GI PPx: PPI  Code Status: Full Code  Dispo:??Admit to MDB  ___________________________________________________________________    Labs/Studies:  Labs and Studies from the last 24hrs per EMR and Reviewed    Objective:  Temp:  [37.3 ??C-37.4 ??C] 37.3 ??C  Heart Rate:  [89-95] 92  Resp:  [17-20] 20  BP: (129-164)/(53-67) 164/59  SpO2:  [94 %-100 %] 100 %, BP 164/59  - Pulse 92  - Temp 37.3 ??C (Oral)  - Resp 20  - Ht 157.5 cm (5' 2)  - Wt 70.9 kg (156 lb 4.9 oz)  - SpO2 100%  - BMI 28.59 kg/m??     General: appears comfortable and in NAD.   HEENT: Head is atraumatic and normocephalic. Sclera anicteric without injection.   Neck: Supple.  Lungs: Lungs CTA throughout without crackles or wheezing. No coughing.   Heart: Regular rate and rhythm. Soft systolic murmur.   Abdomen: Soft, nontender, nondistended. clean back, no redness or tenderness  Extremities: No clubbing, cyanosis, Swelling Right > Left lower extremity (Chronic issue)    Helyn App, MD  PGY-1 Resident

## 2017-03-11 NOTE — Unmapped (Signed)
VENOUS ACCESS ULTRASOUND PROCEDURE NOTE    Indications:   Poor venous access.    The Venous Access Team has assessed this patient for the placement of a PIV. Ultrasound guidance was necessary to obtain access.     Procedure Details:  Risks, benefits and alternatives discussed with patient. Identity of the patient was confirmed via name, medical record number and date of birth. The availability of the correct equipment was verified.    The vein was identified for ultrasound catheter insertion.  Field was prepared with necessary supplies and equipment.  Probe cover and sterile gel utilized.  Insertion site was prepped with chlorhexidine solution and allowed to dry.  The catheter extension was primed with normal saline.A(n) 22 g x 1 3/4 inch catheter was placed in the right forearm with 1 attempt(s).     Catheter aspirated,5 mL blood return present. The catheter was then flushed with 10 mL of normal saline. Insertion site cleansed, and dressing applied per manufacturer guidelines. The catheter was inserted without difficulty by Franchot Gallo RN.    The primary RN was notified.     Thank you,     Franchot Gallo RN Venous Access Team   [phone]     Workup / Procedure Time:  45 minutes    See vein image below:

## 2017-03-11 NOTE — Unmapped (Signed)
Problem: Patient Care Overview  Goal: Plan of Care Review  Outcome: Progressing

## 2017-03-11 NOTE — Unmapped (Signed)
Problem: Patient Care Overview  Goal: Plan of Care Review  Outcome: Progressing  Patient's vs are stable,no c/o pain,7th dose of thymo currently infusing,no IV infiltration noted,will continue to monitor.

## 2017-03-12 LAB — COMPREHENSIVE METABOLIC PANEL
ALBUMIN: 2.2 g/dL — ABNORMAL LOW (ref 3.5–5.0)
ALKALINE PHOSPHATASE: 69 U/L (ref 38–126)
ANION GAP: 7 mmol/L — ABNORMAL LOW (ref 9–15)
AST (SGOT): 11 U/L — ABNORMAL LOW (ref 14–38)
BILIRUBIN TOTAL: 0.4 mg/dL (ref 0.0–1.2)
BLOOD UREA NITROGEN: 30 mg/dL — ABNORMAL HIGH (ref 7–21)
CALCIUM: 8.1 mg/dL — ABNORMAL LOW (ref 8.5–10.2)
CHLORIDE: 111 mmol/L — ABNORMAL HIGH (ref 98–107)
CO2: 24 mmol/L (ref 22.0–30.0)
CREATININE: 1.47 mg/dL — ABNORMAL HIGH (ref 0.60–1.00)
EGFR MDRD AF AMER: 46 mL/min/{1.73_m2} — ABNORMAL LOW (ref >=60)
EGFR MDRD NON AF AMER: 38 mL/min/{1.73_m2} — ABNORMAL LOW (ref >=60)
GLUCOSE RANDOM: 108 mg/dL (ref 65–179)
POTASSIUM: 4.4 mmol/L (ref 3.5–5.0)
PROTEIN TOTAL: 4.5 g/dL — ABNORMAL LOW (ref 6.5–8.3)
SODIUM: 142 mmol/L (ref 135–145)

## 2017-03-12 LAB — CBC W/ AUTO DIFF
BASOPHILS ABSOLUTE COUNT: 0 10*9/L (ref 0.0–0.1)
EOSINOPHILS ABSOLUTE COUNT: 0.1 10*9/L (ref 0.0–0.4)
HEMATOCRIT: 27.8 % — ABNORMAL LOW (ref 36.0–46.0)
HEMOGLOBIN: 8.5 g/dL — ABNORMAL LOW (ref 12.0–16.0)
LYMPHOCYTES ABSOLUTE COUNT: 0.2 10*9/L — ABNORMAL LOW (ref 1.5–5.0)
MEAN CORPUSCULAR HEMOGLOBIN CONC: 30.7 g/dL — ABNORMAL LOW (ref 31.0–37.0)
MEAN CORPUSCULAR HEMOGLOBIN: 29.9 pg (ref 26.0–34.0)
MEAN CORPUSCULAR VOLUME: 97.5 fL (ref 80.0–100.0)
MEAN PLATELET VOLUME: 10.5 fL — ABNORMAL HIGH (ref 7.0–10.0)
MONOCYTES ABSOLUTE COUNT: 0.1 10*9/L — ABNORMAL LOW (ref 0.2–0.8)
NEUTROPHILS ABSOLUTE COUNT: 5.7 10*9/L (ref 2.0–7.5)
PLATELET COUNT: 123 10*9/L — ABNORMAL LOW (ref 150–440)
RED BLOOD CELL COUNT: 2.86 10*12/L — ABNORMAL LOW (ref 4.00–5.20)
WBC ADJUSTED: 6.1 10*9/L (ref 4.5–11.0)

## 2017-03-12 LAB — BUN / CREAT RATIO: Urea nitrogen/Creatinine:MRto:Pt:Ser/Plas:Qn:: 20

## 2017-03-12 LAB — LIPASE: Triacylglycerol lipase:CCnc:Pt:Ser/Plas:Qn:: 242 — ABNORMAL HIGH

## 2017-03-12 LAB — MAGNESIUM: Magnesium:MCnc:Pt:Ser/Plas:Qn:: 1.6

## 2017-03-12 LAB — AMYLASE: Chemistry studies:Cmplx:-:^Patient:Set:: 89

## 2017-03-12 LAB — HEMATOCRIT: Lab: 27.8 — ABNORMAL LOW

## 2017-03-12 LAB — PHOSPHORUS: Phosphate:MCnc:Pt:Ser/Plas:Qn:: 3.2

## 2017-03-12 MED ORDER — VALGANCICLOVIR 450 MG TABLET
ORAL_TABLET | ORAL | 3 refills | 0 days
Start: 2017-03-12 — End: 2017-03-12

## 2017-03-12 MED ORDER — VALGANCICLOVIR 450 MG TABLET: 450 mg | tablet | Freq: Every day | 3 refills | 0 days | Status: AC

## 2017-03-12 NOTE — Unmapped (Signed)
Medicine Daily Progress Note  Interval Events/Subjective:  No events overnight. Looking forward to going home tomorrow. Creatinine improved to 1.47, lipase 242, amylase 89.    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 *    Rachel [name]??is a 47 y.o.??female??s/p deceased donor renal and pancreas transplant in 2010, history of UTI, history of chronic right foot wound who presented to [facility] from transplant nephrology clinic due to elevated amylase and lipase, concerning for pancreatic rejection with subsequent renal biopsy concerning for rejection. She is now s/p IV solumedrol x3 days and thymoglobulin (day 9/10 today) with improvement in creatinine and lipase.  ??  AKI 2/2/ acute rejection: Creatinine has been creeping up for the past few years from a baseline of about 1.3-1.4, now >2 with proteinuria. Ultrasound unremarkable. 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  -- Monitor creatinine, avoid nephrotoxins  -- Continue thymoglobulin 150mg  q24h x 10 days, day 9/10 (1/7- 1/16)  -- daily dCBC    -- prednisone 10mg  daily  ??  Elevated Amylase/Lipase - Concern for pancreatic rejection:??Amylase 329 and Lipase 1925 in clinic on 1/4, now significantly improved. Pancreas u/s with patent vasculature.  -- Follow daily Amylase/Lipase  -- Thymoglobulin as above  -- prednisone 10mg  daily   -- HLA Ab's: negative  ??  S/p Kidney-Pancreas Transplant: s/p deceased donor kidney and pancreas transplant on 01/31/2009, secondary to Type 1 Diabetes mellitus. Also with prior kidney transplant in 1998 that failed in 2008.????No prior history of rejection or recurrent disease in most recent transplant.??  -- Continue home Myfortic 180??mg BID (while taking Thymoglobulin)   -- Continue home Tacrolimus 5??mg BID  -- Valganciclovir 450mg . Change to daily (from every other) now that creatinine function improved   -- Bactrim 1 tablet (M/W/F)  -- pred and thymoglobulin as above   -- Bicarb 1950mg  TID??    Anemia:??Received IV Iron in September 2018 with improvement in Hemoglobin. ??Hemoglobin stable and at baseline.  ??  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 ??Wounds have now all healed. ??Patient follows with orthopedic surgery as an outpatient. ??No increased swelling beyond baseline, no pain, no erythema. ??  ??  Daily Checklist:  Diet: Regular Diet  DVT PPx: Heparin 5000mg  q8h   GI PPx: PPI  Code Status: Full Code  Dispo: Admit to MDB  ___________________________________________________________________    Labs/Studies:  Labs and Studies from the last 24hrs per EMR and Reviewed    Objective:  Temp:  [36.6 ??C-37.5 ??C] 37.4 ??C  Heart Rate:  [91-99] 92  Resp:  [16-18] 16  BP: (114-151)/(60-74) 140/69  SpO2:  [94 %-100 %] 98 %, BP 140/69  - Pulse 92  - Temp 37.4 ??C (Oral)  - Resp 16  - Ht 157.5 cm (5' 2)  - Wt 81.5 kg (179 lb 10.8 oz)  - SpO2 98%  - BMI 32.86 kg/m??     General: sleeping, in NAD. Awakens easily, very pleasant.  HEENT: Head is atraumatic and normocephalic. Sclera anicteric without injection.   Lungs: Lungs CTA throughout without crackles or wheezing. No coughing.   Heart: Regular rate and rhythm. Soft systolic murmur.   Abdomen: Soft, nontender, nondistended. clean back, no redness or tenderness  Extremities: No clubbing, cyanosis. Warm and well perfused

## 2017-03-12 NOTE — Unmapped (Signed)
Problem: Kidney Transplant (Adult)  Goal: Signs and Symptoms of Listed Potential Problems Will be Absent, Minimized or Managed (Kidney Transplant)  Signs and symptoms of listed potential problems will be absent, minimized or managed by discharge/transition of care (reference Kidney Transplant (Adult) CPG).    Outcome: Progressing    Goal: Anesthesia/Sedation Recovery  Outcome: Progressing      Problem: Self-Care Deficit (Adult,Obstetrics,Pediatric)  Goal: Improved Ability to Perform BADL and IADL  Patient will demonstrate the desired outcomes by discharge/transition of care.   Outcome: Progressing      Problem: VTE, DVT and PE (Adult)  Goal: Signs and Symptoms of Listed Potential Problems Will be Absent, Minimized or Managed (VTE, DVT and PE)  Signs and symptoms of listed potential problems will be absent, minimized or managed by discharge/transition of care (reference VTE, DVT and PE (Adult) CPG).   Outcome: Progressing      Comments: Pt had tolerated the thymo.vital signs stable.

## 2017-03-13 LAB — SODIUM: Sodium:SCnc:Pt:Ser/Plas:Qn:: 141

## 2017-03-13 LAB — MONOCYTES ABSOLUTE COUNT: Lab: 0.2

## 2017-03-13 LAB — COMPREHENSIVE METABOLIC PANEL
ALBUMIN: 2.3 g/dL — ABNORMAL LOW (ref 3.5–5.0)
ALKALINE PHOSPHATASE: 73 U/L (ref 38–126)
ALT (SGPT): 28 U/L (ref 15–48)
ANION GAP: 6 mmol/L — ABNORMAL LOW (ref 9–15)
AST (SGOT): 12 U/L — ABNORMAL LOW (ref 14–38)
BILIRUBIN TOTAL: 0.4 mg/dL (ref 0.0–1.2)
BLOOD UREA NITROGEN: 31 mg/dL — ABNORMAL HIGH (ref 7–21)
BUN / CREAT RATIO: 21
CALCIUM: 8.4 mg/dL — ABNORMAL LOW (ref 8.5–10.2)
CHLORIDE: 111 mmol/L — ABNORMAL HIGH (ref 98–107)
CREATININE: 1.46 mg/dL — ABNORMAL HIGH (ref 0.60–1.00)
EGFR MDRD AF AMER: 47 mL/min/{1.73_m2} — ABNORMAL LOW (ref >=60)
EGFR MDRD NON AF AMER: 38 mL/min/{1.73_m2} — ABNORMAL LOW (ref >=60)
GLUCOSE RANDOM: 113 mg/dL (ref 65–179)
POTASSIUM: 4.7 mmol/L (ref 3.5–5.0)
PROTEIN TOTAL: 4.5 g/dL — ABNORMAL LOW (ref 6.5–8.3)

## 2017-03-13 LAB — CBC W/ AUTO DIFF
BASOPHILS ABSOLUTE COUNT: 0 10*9/L (ref 0.0–0.1)
HEMATOCRIT: 27.1 % — ABNORMAL LOW (ref 36.0–46.0)
HEMOGLOBIN: 8.2 g/dL — ABNORMAL LOW (ref 12.0–16.0)
LARGE UNSTAINED CELLS: 0 % (ref 0–4)
LYMPHOCYTES ABSOLUTE COUNT: 0.1 10*9/L — ABNORMAL LOW (ref 1.5–5.0)
MEAN CORPUSCULAR HEMOGLOBIN: 29.2 pg (ref 26.0–34.0)
MEAN CORPUSCULAR VOLUME: 96.3 fL (ref 80.0–100.0)
MEAN PLATELET VOLUME: 10.2 fL — ABNORMAL HIGH (ref 7.0–10.0)
MONOCYTES ABSOLUTE COUNT: 0.2 10*9/L (ref 0.2–0.8)
NEUTROPHILS ABSOLUTE COUNT: 7.1 10*9/L (ref 2.0–7.5)
PLATELET COUNT: 115 10*9/L — ABNORMAL LOW (ref 150–440)
RED CELL DISTRIBUTION WIDTH: 15.9 % — ABNORMAL HIGH (ref 12.0–15.0)
WBC ADJUSTED: 7.6 10*9/L (ref 4.5–11.0)

## 2017-03-13 LAB — PHOSPHORUS: Phosphate:MCnc:Pt:Ser/Plas:Qn:: 3.1

## 2017-03-13 LAB — MAGNESIUM: Magnesium:MCnc:Pt:Ser/Plas:Qn:: 1.6

## 2017-03-13 LAB — LIPASE: Triacylglycerol lipase:CCnc:Pt:Ser/Plas:Qn:: 221

## 2017-03-13 LAB — TACROLIMUS, TROUGH: Lab: 8.8

## 2017-03-13 LAB — AMYLASE: Chemistry studies:Cmplx:-:^Patient:Set:: 70

## 2017-03-13 MED ORDER — PROGRAF 1 MG CAPSULE: capsule | 5 refills | 0 days | Status: AC

## 2017-03-13 MED ORDER — PROGRAF 1 MG CAPSULE
ORAL_CAPSULE | ORAL | 5 refills | 0.00000 days | Status: SS
Start: 2017-03-13 — End: 2017-04-28

## 2017-03-13 MED ORDER — VALGANCICLOVIR 450 MG TABLET
ORAL_TABLET | Freq: Every day | ORAL | 2 refills | 0.00000 days | Status: CP
Start: 2017-03-13 — End: 2017-06-11

## 2017-03-13 MED ORDER — SULFAMETHOXAZOLE 400 MG-TRIMETHOPRIM 80 MG TABLET: 1 | tablet | 2 refills | 0 days | Status: AC

## 2017-03-13 MED ORDER — SULFAMETHOXAZOLE 400 MG-TRIMETHOPRIM 80 MG TABLET
ORAL_TABLET | ORAL | 2 refills | 0.00000 days | Status: CP
Start: 2017-03-13 — End: 2017-03-13

## 2017-03-13 MED ORDER — PROGRAF 1 MG CAPSULE: capsule | 5 refills | 0 days

## 2017-03-13 MED ORDER — SULFAMETHOXAZOLE 400 MG-TRIMETHOPRIM 80 MG TABLET: 1 | tablet | 2 refills | 0 days

## 2017-03-13 MED ORDER — PREDNISONE 10 MG TABLET: 10 mg | tablet | Freq: Every day | 1 refills | 0 days | Status: AC

## 2017-03-13 MED ORDER — PREDNISONE 10 MG TABLET
ORAL_TABLET | ORAL | 1 refills | 0 days
Start: 2017-03-13 — End: 2017-03-13

## 2017-03-13 NOTE — Unmapped (Signed)
The Venous Access Team (VAT) received a request from care RN to place a PIV. Attempted x2 but unsuccessful. Sent 2nd VAT member for 2nd look but orders to not place another PIV so that pt. Can be discharged occurred before VAT could arrive.          PIV Workup / Procedure Time:  30 minutes      The primary RN was notified.       Thank you,     Elizabeth Palau RN Venous Access Team

## 2017-03-13 NOTE — Unmapped (Signed)
Problem: Patient Care Overview  Goal: Plan of Care Review  Outcome: Progressing  Patient is awaiting discharge this afternoon. Right arm has mild edema from infiltrated IV, patient denies any pain at the site. Patient will contact family to provide transportation home today.   Goal: Individualization and Mutuality  Outcome: Progressing    Goal: Discharge Needs Assessment  Outcome: Progressing    Goal: Interprofessional Rounds/Family Conf  Outcome: Progressing      Problem: Fall Risk (Adult)  Goal: Absence of Fall  Patient will demonstrate the desired outcomes by discharge/transition of care.   Outcome: Progressing      Problem: Kidney Transplant (Adult)  Goal: Signs and Symptoms of Listed Potential Problems Will be Absent, Minimized or Managed (Kidney Transplant)  Signs and symptoms of listed potential problems will be absent, minimized or managed by discharge/transition of care (reference Kidney Transplant (Adult) CPG).    Outcome: Progressing      Problem: Self-Care Deficit (Adult,Obstetrics,Pediatric)  Goal: Improved Ability to Perform BADL and IADL  Patient will demonstrate the desired outcomes by discharge/transition of care.   Outcome: Progressing      Problem: VTE, DVT and PE (Adult)  Goal: Signs and Symptoms of Listed Potential Problems Will be Absent, Minimized or Managed (VTE, DVT and PE)  Signs and symptoms of listed potential problems will be absent, minimized or managed by discharge/transition of care (reference VTE, DVT and PE (Adult) CPG).   Outcome: Progressing

## 2017-03-13 NOTE — Unmapped (Signed)
Problem: Patient Care Overview  Goal: Plan of Care Review  Outcome: Progressing  Pt calm and cooperative during shift. She ambulates in room independently. No report of pain. VS stable. No falls or injuries. Pt aware of plan for last dose of thymo to start early in am. Will continue to monitor.   Goal: Individualization and Mutuality  Outcome: Progressing    Goal: Discharge Needs Assessment  Outcome: Progressing    Goal: Interprofessional Rounds/Family Conf  Outcome: Progressing      Problem: Fall Risk (Adult)  Goal: Absence of Fall  Patient will demonstrate the desired outcomes by discharge/transition of care.   Outcome: Progressing      Problem: Kidney Transplant (Adult)  Goal: Signs and Symptoms of Listed Potential Problems Will be Absent, Minimized or Managed (Kidney Transplant)  Signs and symptoms of listed potential problems will be absent, minimized or managed by discharge/transition of care (reference Kidney Transplant (Adult) CPG).    Outcome: Progressing    Goal: Anesthesia/Sedation Recovery  Outcome: Progressing      Problem: Self-Care Deficit (Adult,Obstetrics,Pediatric)  Goal: Improved Ability to Perform BADL and IADL  Patient will demonstrate the desired outcomes by discharge/transition of care.   Outcome: Progressing      Problem: VTE, DVT and PE (Adult)  Goal: Signs and Symptoms of Listed Potential Problems Will be Absent, Minimized or Managed (VTE, DVT and PE)  Signs and symptoms of listed potential problems will be absent, minimized or managed by discharge/transition of care (reference VTE, DVT and PE (Adult) CPG).   Outcome: Progressing

## 2017-03-13 NOTE — Unmapped (Signed)
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 *      Outpatient Provider Follow Up Issues:   1) Patient discharged on home dosage of mycophenalate @ 360mg  BID. This may be worth considering increasing as outpatient. Until that decision has been made we have continued prednisone 10mg  daily.   2) Recommend a Tacrolimus trough next week to follow new Tacrolimus dosage.     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 Hospital course by problem below.     AKI 2/2/ acute rejection: Creatinine has been creeping up for the past few years from a baseline of about 1.3-1.4, now >2 with proteinuria. Ultrasound unremarkable.   - Renal Biopsy complete: Acute tubulointerstitial cellular rejection, with mild focal transplant endarteritis, Mild arteriosclerosis, mild to moderate interstitial fibrosis and tubular atrophy.  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   - Chest/Abd/Pelv CT scan w/o contrast: No acute abnormalities  - Pancreatic Ultrasound: Patent venous and arterial vasculature to the pancreatic graft.  - Solumedrol 500mg  x 3 days  - C-peptide: wnl  - HLA Ab's- Neg    S/p Kidney-Pancreas Transplant: s/p deceased donor kidney and pancreas transplant on 01/31/2009, secondary to Type 1 Diabetes mellitus. Also with prior kidney transplant in 1998 that failed in 2008.????No prior history of rejection or recurrent disease in most recent transplant.??  -- Myfortic 180??mg BID (while taking Thymoglobulin) and transition back to 360mg  BID on discharge  -- Tacrolimus throughout stay; @ discharge Tacrolimus regimen 5mg /4mg  morning/evening  -- Valganciclovir 450mg  daily  -- Bactrim 1 tablet (M/W/F)  -- 10mg  Prednisone will be supplied upon discharge and will be continued until patient sees her nephrologist outpatient.   -- Bicarb 1950mg  TID??    Cough (Resolved):  three weeks of a cough with sinus congestion and post-nasal drip. ??Denies any fevers or chills at home. ??Non-toxic appearing, lungs clear to auscultation. CT chest unremarkable  - Azithromycin 500mg  x 1, 250mg  x 4days  Metabolic Acidosis:??Bicarb more recently has been 13. Admits to not having taken home bicarb in weeks d/t not having it.   - Sodium Bicarbonate 1950 mg TID    Procedures:  Thymoglobulin therapy   u/s, steroids  ______________________________________________________________________    Discharge Day Services:  BP 134/58  -  Pulse 94  - Temp 37.4 ??C (Oral)  - Resp 16  - Ht 157.5 cm (5' 2)  - Wt 81.3 kg (179 lb 3.7 oz)  - SpO2 98%  - BMI 32.78 kg/m??   Pt seen on the day of discharge and determined appropriate for discharge.    Condition at Discharge: good  ______________________________________________________________________  Discharge Medications:     Your Medication List      START taking these medications predniSONE 10 MG tablet  Commonly known as:  DELTASONE  Take 1 tablet (10 mg total) by mouth daily.  Start taking on:  03/14/2017     sulfamethoxazole-trimethoprim 400-80 mg per tablet  Commonly known as:  BACTRIM,SEPTRA  Take 1 tablet (80 mg of trimethoprim total) by mouth 3 (three) times a week.     valGANciclovir 450 mg tablet  Commonly known as:  VALCYTE  Take 1 tablet (450 mg total) by mouth daily.        CHANGE how you take these medications    amitriptyline 25 MG tablet  Commonly known as:  ELAVIL  Take 1 tablet (25 mg total) by mouth three (3) times a day (at 6am, noon and 6pm).  What changed:  when to take this     atorvastatin 40 MG tablet  Commonly known as:  LIPITOR  TAKE ONE TABLET BY MOUTH DAILY  What changed:  ?? how much to take  ?? how to take this  ?? when to take this     polyethylene glycol 17 gram/dose powder  Commonly known as:  MIRALAX  Take 17 g by mouth daily.  What changed:  ?? when to take this  ?? reasons to take this     PROGRAF 1 MG capsule  Generic drug:  tacrolimus  5mg  in the am and 4mg  in the pm, DAW1, brand medically necessary, tx date 01/31/09, Z94.0, [facility]  What changed:  additional instructions        CONTINUE taking these medications    ADULT LOW DOSE ASPIRIN 81 MG tablet  Generic drug:  aspirin  Take 81 mg by mouth daily.     albuterol 90 mcg/actuation inhaler  Commonly known as:  PROVENTIL HFA;VENTOLIN HFA  Inhale 2 puffs every six (6) hours as needed for wheezing or shortness of breath.     calcium-vitamin D 500 mg(1,250mg ) -200 unit per tablet  Generic drug:  calcium-vitamin D  Take 500 mg by mouth daily.     cyclobenzaprine 5 MG tablet  Commonly known as:  FLEXERIL  Take 1-2 tablets (5-10 mg total) by mouth Three (3) times a day as needed for muscle spasms.     DUREZOL 0.05 % Drop  Generic drug:  difluprednate  Administer 1 drop to both eyes daily as needed.     mycophenolate 180 MG EC tablet  Commonly known as:  MYFORTIC  Take 2 tablets (360 mg total) by mouth Two (2) times a day. Z94.0, DAW1, brand medically necessary     omeprazole 40 MG capsule  Commonly known as:  PriLOSEC  TAKE ONE CAPSULE BY MOUTH TWICE A DAY     PHARMACIST FAVORITE MULTI-VIT per tablet  Generic drug:  multivitamin  Take 1 tablet by mouth daily. Frequency:QD   Dosage:1   TABLET  Instructions:  Note:Dose: 1 TABLET     sodium bicarbonate 650 mg tablet  Take 2 tablets (1,300 mg total) by mouth Two (2) times a day.     VIGAMOX 0.5 % ophthalmic solution  Generic drug:  moxifloxacin  Administer 1 drop to both eyes daily as needed.        ASK your doctor about these medications    azithromycin 250 MG tablet  Commonly known as:  ZITHROMAX Z-PAK  Take 2 tablets (500 mg) on  Day 1,  followed by 1 tablet (250 mg) once daily on Days 2 through 5.  Ask about: Should I take this medication?          ______________________________________________________________________  Pending Test Results (if blank, then none):      Most Recent Labs:  Microbiology Results (last day)     ** No results found for the last 24 hours. **          Lab Results   Component Value Date    WBC 7.6 03/13/2017    HGB 8.2 (L) 03/13/2017    HCT 27.1 (L) 03/13/2017    PLT 115 (L) 03/13/2017       Lab Results   Component Value Date    NA 141 03/13/2017    K 4.7 03/13/2017    CL 111 (H) 03/13/2017    CO2 24.0 03/13/2017    BUN 31 (H) 03/13/2017    CREATININE 1.46 (H) 03/13/2017    CALCIUM 8.4 (L) 03/13/2017    MG 1.6 03/13/2017    PHOS 3.1 03/13/2017       Lab Results   Component Value Date    ALKPHOS 73 03/13/2017    BILITOT 0.4 03/13/2017    BILIDIR <0.10 07/02/2016    PROT 4.5 (L) 03/13/2017    ALBUMIN 2.3 (L) 03/13/2017    ALT 28 03/13/2017    AST 12 (L) 03/13/2017    GGT 14 03/01/2017       Lab Results   Component Value Date    PT 11.1 03/04/2017    INR 0.97 03/04/2017    APTT 20.8 (L) 03/04/2017     Hospital Radiology:  Ct Abdomen Pelvis Wo Contrast    Result Date: 03/03/2017  EXAM: CT Chest, Abdomen & Pelvis without contrast DATE: 03/02/2017 10:20 PM ACCESSION: [id number] Bethann Humble [id number] UN DICTATED: 03/02/2017 10:29 PM INTERPRETATION LOCATION: [facility] CLINICAL INDICATION: 47 years old Female with Other-cough in IC patient-  COMPARISON: CT of the abdomen and pelvis dated 06/17/2015 TECHNIQUE: A spiral CT scan was obtained without IV contrast from the thoracic inlet through the pubic symphysis. Images were reconstructed in the axial plane.  Coronal and sagittal reformatted images were also provided for further evaluation. Oral contrast was given. FINDINGS: LINES/DEVICES: None. CHEST: Vessels: Calcifications of the thoracic aorta and coronary vessels. There is a metallic stent within the right subclavian vein and within the upper right extremity Mediastinum: Cardiomegaly.  No pericardial effusion. Esophagus is dilated (1:43). Airways: Central airways are clear. Lungs/Pleura: No consolidation. Lymph Nodes: No mediastinal, hilar, or axillary, adenopathy by size criteria. ABDOMEN/PELVIS: HEPATOBILIARY: Unremarkable liver. No biliary ductal dilatation. GALLBLADDER: Unremarkable. PANCREAS: Right lower quadrant pancreatic transplant. SPLEEN: Unremarkable. ADRENAL GLANDS: Unremarkable. KIDNEYS/URETERS: The native kidneys are atrophic. No hydronephrosis. There is a left lower quadrant transplant kidney. BLADDER: Unremarkable. BOWEL/PERITONEUM/RETROPERITONEUM: Moderate rectal stool burden. No acute inflammatory process. No free fluid. VASCULATURE: Limited evaluation of the abdominal vessels in the absence of IV contrast. There is calcification of the abdominal aorta and extensive calcifications of the branch vessels consistent with medical renal disease. REPRODUCTIVE ORGANS: IUD in the uterus. LYMPH NODES: No adenopathy by size criteria. BONES/SOFT TISSUES: Unremarkable.      No consolidation. Cardiomegaly. No acute abnormalities  in the abdomen or pelvis.    Ct Chest Wo Contrast    Result Date: 03/03/2017  EXAM: CT Chest, Abdomen & Pelvis without contrast DATE: 03/02/2017 10:20 PM ACCESSION: [id number] Bethann Humble [id number] UN DICTATED: 03/02/2017 10:29 PM INTERPRETATION LOCATION: [facility] CLINICAL INDICATION: 47 years old Female with Other-cough in IC patient-  COMPARISON: CT of the abdomen and pelvis dated 06/17/2015 TECHNIQUE: A spiral CT scan was obtained without IV contrast from the thoracic inlet through the pubic symphysis. Images were reconstructed in the axial plane.  Coronal and sagittal reformatted images were also provided for further evaluation. Oral contrast was given. FINDINGS: LINES/DEVICES: None. CHEST: Vessels: Calcifications of the thoracic aorta and coronary vessels. There is a metallic stent within the right subclavian vein and within the upper right extremity Mediastinum: Cardiomegaly.  No pericardial effusion. Esophagus is dilated (1:43). Airways: Central airways are clear. Lungs/Pleura: No consolidation. Lymph Nodes: No mediastinal, hilar, or axillary, adenopathy by size criteria. ABDOMEN/PELVIS: HEPATOBILIARY: Unremarkable liver. No biliary ductal dilatation. GALLBLADDER: Unremarkable. PANCREAS: Right lower quadrant pancreatic transplant. SPLEEN: Unremarkable. ADRENAL GLANDS: Unremarkable. KIDNEYS/URETERS: The native kidneys are atrophic. No hydronephrosis. There is a left lower quadrant transplant kidney. BLADDER: Unremarkable. BOWEL/PERITONEUM/RETROPERITONEUM: Moderate rectal stool burden. No acute inflammatory process. No free fluid. VASCULATURE: Limited evaluation of the abdominal vessels in the absence of IV contrast. There is calcification of the abdominal aorta and extensive calcifications of the branch vessels consistent with medical renal disease. REPRODUCTIVE ORGANS: IUD in the uterus. LYMPH NODES: No adenopathy by size criteria. BONES/SOFT TISSUES: Unremarkable.      No consolidation. Cardiomegaly. No acute abnormalities in the abdomen or pelvis.    US Renal Transplant W Doppler    Result Date: 03/02/2017  EXAM: US RENAL TRANSPLANT W DOPPLER DATE: 03/01/2017 9:45 PM ACCESSION: [id number] UN DICTATED: 03/01/2017 10:03 PM INTERPRETATION LOCATION: [facility] CLINICAL INDICATION: 47 years old Female with transplant patient with AKI-- COMPARISON: 08/25/2016 TECHNIQUE:  Ultrasound views of the renal transplant were obtained using gray scale and color and spectral Doppler imaging. Views of the urinary bladder were obtained using gray scale imaging. FINDINGS: TRANSPLANTED KIDNEY: The renal transplant was located in the left lower quadrant. Normal size and echogenicity.  No solid masses or calculi. No perinephric collections identified. No hydronephrosis. VESSELS: - Perfusion: Using power Doppler, normal perfusion was seen throughout the renal parenchyma. - Resistive indices in the renal transplant are decreased compared with prior examination. - Main renal artery/iliac artery: Patent - Main renal vein/iliac vein: Patent BLADDER: Unremarkable.      Interval decrease in resistive indices, which are now largely within normal limits. Persistently elevated resistive indices within the main renal artery. Please see below for data measurements: LLQ kidney: length 10.4 cm; width 5.9 cm; height 5.8 cm Arcuate artery superior resistive indices: .68 Arcuate artery mid resistive indices: .69 Arcuate artery inferior resistive indices: .74 Segmental artery superior resistive indices: .77 Segmental artery mid resistive indices: .80 Segmental artery inferior resistive indices: .72 Main renal artery resistive indices: .81-.89 Main renal vein: Patent Iliac artery: Patent Iliac vein: Patent Bladder volume: 62.05 ml      Xr Chest 2 Views    Result Date: 03/02/2017  EXAM: XR CHEST 2 VIEWS DATE: 03/01/2017 8:21 PM ACCESSION: [id number] UN DICTATED: 03/02/2017 9:44 AM INTERPRETATION LOCATION: [facility] CLINICAL INDICATION: 47 years old Female with COUGH--  COMPARISON:  06/08/2016   TECHNIQUE:   PA and lateral views of the chest  FINDINGS: Cardiac size and silhouette and mediastinal contours are normal. Vascular stents overlying the right chest  wall and right upper arm unchanged.  No new consolidation, edema, pneumothorax or evidence for failure.      Lungs clear.    US Renal (limited)    Result Date: 03/04/2017  EXAM: Limited Renal DATE: 03/04/2017 5:36 PM ACCESSION: [id number] UN DICTATED: 03/04/2017 5:39 PM INTERPRETATION LOCATION: [facility] CLINICAL INDICATION: 47 years old Female with Hematoma post renal biopsy-- COMPARISON: Earlier same day renal transplant ultrasound biopsy TECHNIQUE: Ultrasound images of the recently biopsied  left lower quadrant renal transplant in sagittal and transverse planes were obtained with grayscale and limited color Doppler imaging. FINDINGS: Trace fluid seen along the upper pole left lower quadrant transplant kidney. There is no evidence of active bleeding at the biopsy site. No hydronephrosis is seen.      Trace fluid seen along the upper pole transplant kidney, which may have been present on earlier same day ultrasound prior to biopsy versus small 1 cm hematoma.      US Renal Biopsy    Result Date: 03/04/2017  EXAM: US RENAL BIOPSY DATE: 03/04/2017 10:30 AM ACCESSION: [id number] UN DICTATED: 03/04/2017 10:32 AM INTERPRETATION LOCATION: [facility] CLINICAL INDICATION: 24 years year old Female: evaluate for rejection--  TECHNIQUE: Ultrasound, retroperitoneal, real time with image documentation, limited.  Ultrasonic guidance for needle placement, imaging supervision and interpretation. COMPARISON: 03/01/2017 BIOPSY PROCEDURE:  Images of the left lower quadrant renal transplant were obtained in transverse and longitudinal planes. No contraindication to biopsy was seen. No overlying bowel or vessel was seen along the planned biopsy route. Using sterile technique, the upper pole of the left lower quadrant renal transplant was localized for Dr. Galen Manila of the Nephrology service. 2 passes were made using a spring loaded biopsy needle under real time sonographic guidance.  [Following the first pass mild post biopsy bleeding was identified, 5 minutes of pressure were held at the biopsy site and no evidence of remaining bleed was observed. ] No obvious post-biopsy hydronephrosis, hematoma or active bleeding was demonstrated with gray scale or color doppler following the completion of the biopsy A sonographer on the Radiology staff assisted with visualization of the kidney.  No radiologist was present during the procedure.      --Real time sonographic guidance for renal transplant biopsy. [ Immediate post biopsy bleeding which resolved with pressure. No hematoma identified.]      US Pancreas Transplant    Result Date: 03/02/2017  EXAM: US PANCREAS TRANSPLANT DATE: 03/02/2017 12:48 PM ACCESSION: [id number] UN DICTATED: 03/02/2017 12:50 PM INTERPRETATION LOCATION: [facility] CLINICAL INDICATION: 47 years old Female with pancreatic transplant, r/o thrombosis-- COMPARISON: None TECHNIQUE:  Ultrasound views of the pancreas transplant were obtained using gray scale and color and spectral Doppler imaging. FINDINGS: TRANSPLANTED PANCREAS: The pancreas transplant was located in the right lower quadrant. There is homogenous hypoechogenicity consistent with normal pancreatic parenchyma.  No solid masses or calculi. No peripancreatic collection. No ductal dilatation. VESSELS: - Perfusion: Using power Doppler, normal perfusion was seen throughout the parenchyma. - Resistive indices in the transplant are listed below - Main artery/iliac artery: Patent - Main vein/iliac vein: Patent      Patent venous and arterial vasculature to the pancreatic graft. No peripancreatic fluid collection or ductal dilatation. The above findings of this study were discussed via telephone with Dr. Irwin Brakeman by Dr. Casper Harrison at 03/02/2017 7:16 PM. Please see below for data measurements: Pancreatic head: 2.03 cm Pancreatic body: 1.57 cm Pancreatic tail: 1.47 cm Main pancreatic artery anastomosis resistive indices: 0.9 Main pancreatic artery head resistive indices: 0.77  Main pancreatic artery body resistive indices: 0.78 Main pancreatic artery tail resistive indices: 0.84 Main pancreatic vein anastomosis: Patent Main pancreatic vein head: Patent Main pancreatic vein body: patent Main pancreatic vein tail: patent Parenchymal arterial head resistive indices: 0.65 Parenchymal arterial body resistive indices: 0.78 Parenchymal arterial tail resistive indices: 0.78 Parenchymal venous head: patent Parenchymal venous body: patent Parenchymal venous tail: patent Iliac artery: patent Iliac vein: patent        ______________________________________________________________________    Discharge Instructions:   Activity Instructions     Activity as tolerated             Diet Instructions     Discharge diet (specify)       Discharge Nutrition Therapy:  General          Other Instructions     No dressing needed             Follow Up instructions and Outpatient Referrals     Call MD for:  difficulty breathing, headache or visual disturbances       Call MD for:  extreme fatigue       Call MD for:  persistent dizziness or light-headedness       Call MD for:  persistent nausea or vomiting       Call MD for:  redness, tenderness, or signs of infection (pain, swelling, redness, odor or green/yellow discharge around incision site)       Call MD for:  temperature >38.5 Celsius       Discharge instructions       You were in the hospital for         Discharge instructions       You have been admitted to [facility] for evaluation and treatment of Acute graft rejection. When you leave the hospital:    Please, take your medications as prescribed.   - We recommend going to get your Tacrolimus level drawn next week to follow the new dosing regimen.    Please, make all follow up appointments and be sure to take all your medications with you so your provider can better guide your care.     If after leaving the hospital you notice new or worsening symptoms such as changes in your mental status, worsening abdominal pain, nausea and vomiting that prevents you from remaining hydrated or taking your medication, chest pain, shortness of breath, fevers or chills weakness/paralysis, difficulty walking, or seizure-like activity, please seek medical attention.               Appointments which have been scheduled for you    Mar 15, 2017  2:20 PM EST  (Arrive by 2:05 PM)  OFFICE VISIT with Lucille Passy, MD  [facility] FAMILY MEDIC[facility] REGION) [address] [place] [address]  [phone]   Apr 11, 2017  8:20 AM EST  RETURN  TRAUMA with Garen Grams, MD  [facility] ORTHOPAEDICS [facility] REGION) [address]  [place] [address]  [phone]   Jun 27, 2017 10:00 AM EDT  (Arrive by 9:45 AM)  MEDICARE Fenton Malling with Theora Master, LCSW  [facility] FAMILY MEDICINE [facility] REGION) [address] [place] [address]  [phone]          Length of Discharge: I spent greater than 30 mins in the discharge of this patient.    Helyn App, MD  PGY-1 Resident

## 2017-03-13 NOTE — Unmapped (Signed)
Problem: Patient Care Overview  Goal: Plan of Care Review  Outcome: Progressing  Patient receiving Thymo 6/7 today, VSS. Patient has been ambulating in her room during the day. Patient was bale to complete self care in the morning prior to starting infusion.   Goal: Individualization and Mutuality  Outcome: Progressing    Goal: Discharge Needs Assessment  Outcome: Progressing    Goal: Interprofessional Rounds/Family Conf  Outcome: Progressing      Problem: Fall Risk (Adult)  Goal: Absence of Fall  Patient will demonstrate the desired outcomes by discharge/transition of care.   Outcome: Progressing      Problem: Self-Care Deficit (Adult,Obstetrics,Pediatric)  Goal: Improved Ability to Perform BADL and IADL  Patient will demonstrate the desired outcomes by discharge/transition of care.   Outcome: Progressing      Problem: VTE, DVT and PE (Adult)  Goal: Signs and Symptoms of Listed Potential Problems Will be Absent, Minimized or Managed (VTE, DVT and PE)  Signs and symptoms of listed potential problems will be absent, minimized or managed by discharge/transition of care (reference VTE, DVT and PE (Adult) CPG).   Outcome: Progressing

## 2017-03-14 MED ORDER — PREDNISONE 10 MG TABLET
ORAL_TABLET | Freq: Every day | ORAL | 1 refills | 0.00000 days | Status: CP
Start: 2017-03-14 — End: 2017-03-13

## 2017-03-14 NOTE — Unmapped (Deleted)
Assessment & Plan        Time: Greater than 50% of this encounter was spent in direct consultation with the patient in evaluation and discussing the following.  Duration of encounter: ***           Problem List Items Addressed This Visit     None           No Follow-up on file.      Patient Instructions and After Visit Summary printed out and provided to patient as appropriate. Barriers to goals identified and addressed.  Pertinent handouts given today and reviewed with the patient.  Encouraged keeping regular logs for me to review at next visit as appropriate. No outside resources/referrals needed at this time unless stated otherwise above. The potential risks and benefits associated with any new medications that were prescribed today were discussed in full detail and patient expressed understanding.    Disposition: Barriers to goals identified and addressed. Pertinent handouts given today and reviewed with the patient. Encouraged keeping regular logs for me to review at next visit. No outside resources/referrals needed at this time unless stated otherwise above. The potential risks and benefits associated with any new medications that were prescribed today were discussed and patient expressed understanding. Patient expresses confidence in ability to manage personal health conditions. Patient has no visual, hearing or language needs unless otherwise stated.  Reviewed diagnosis, management options, expected outcomes and reasons for scheduled and emergent follow up.  Questions were adequately answered. For any new medications, MSE discussed and reviewed and patient voiced understanding.  Discussed any new diagnoses and the care plan with patient, and provided care plan in writing to family/pt/caregiver.  Patient expressed verbal understanding and agreement with stated plan.  If the patient's family member or friend was used as an Equities trader, this was done at the request of the patient.      Subjective: Rachel [name] is a 47 y.o. female who presents for No chief complaint on file.            History of Present Illness     Last OV with me on 12/17/2016. Acute pain of L shoulder like rotator cuff tendonitis-exam reassuring.Pt responded well to prednisone burst for R rotator cuff tendonitis ~06/2016. Script for oral steroid taper sent. Xray L shoulder ordered as area does appear possibly swollen. 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 She clarified that the only SEs from oxycodone are bad dreams (not confusion/dizziness as indicated in chart); updated her chart to reflect this. HEP provided w/theraband. If no improvement, could also consider CSI L shoulder and PT. Trigger little finger of R hand is a prior for pt in ~2013. Improved with CSI in past, therefore repeat CSI performed at clinic. If no improvement, planned to refer to ortho for possible surgical trigger finger release. XR L shoulder showed no acute fracture or dislocation. Mild L AC joint OA. Seen by ortho on 01/10/2017 for closed fracture of R ankle with malunion. XR ordered. Results showed Status post interval hindfoot fusion. No evidence of immediate hardware complication. A comminuted, nondisplaced fracture of the base of the medial distal tibia is more apparent on current examination. Seen by ortho on 02/07/2017. Repeat XR ordered. Results showed s/p triple arthrodesis with no evidence of acute hardware complication. Improved alignment of the tibiotalar joint and subacute medial malleolus fracture. Seen by nephro on 03/01/2017 for routine f/u. Creatinine  was well above baseline at 2.2. Pancreas labs were quite abnormal with amylase of 329 and lipase of 1925.  Her liver function tests were unremarkable. Planned to admit her directly to hospital for hydration and elevated amylase and lipase. Prescribed Z-pak due to persistent cough for ~3wks. Hx of frequent UTIs. UA was unremarkable.Pt admitted to hospital on 03/01/2017 and discharged home on 03/13/2017. Renal biopsy had shown  Acute tubulointerstitial cellular rejection, with mild focal transplant endarteritis, Mild arteriosclerosis, mild to moderate interstitial fibrosis and tubular atrophy.Thymoglobulin 150mg  q24h x 10 days, day 9/10??(1/7- 1/16). Chest/Abd/Pelv CT scan w/o contrast: No acute abnormalities. Pancreatic Ultrasound: Patent venous and arterial vasculature to the pancreatic graft. Pt was sent home with prednisone 10mg  qd, Bactrim 400-80mg  3x/wk and valganciclovir 450mg  qd. Changed amitriptyline 25mg  TID, atorvastatin 40mg , polyethelene glycol 17g/dose power qd and prograf 1mg  qd. Due for retinal eye exam and foot exam.        Review of Systems     History obtained from {source of history:310783} and chart review     Unless otherwise stated in the HPI:  CONSTITUTIONAL: no f/c/s  HEENT: Eyes: No diplopia or blurred vision. ENT: No earache, sore throat or runny nose.   CARDIOVASCULAR: No pressure, squeezing, strangling, tightness, heaviness or aching about the chest, neck, axilla or epigastrium.   RESPIRATORY: No cough, shortness of breath, PND or orthopnea.   GASTROINTESTINAL: No nausea, vomiting or diarrhea.   GENITOURINARY: No dysuria, frequency or urgency.   MUSCULOSKELETAL: As per HPI.   SKIN: No change in skin, hair or nails.   NEUROLOGIC: No paresthesias, fasciculations, seizures or weakness.   PSYCHIATRIC: No disorder of thought or mood.   ENDOCRINE: No heat or cold intolerance, polyuria or polydipsia.   HEMATOLOGICAL: No easy bruising or bleeding.            Review of History     MEDICATIONS:        Current Outpatient Prescriptions on File Prior to Visit   Medication Sig Dispense Refill   ??? albuterol (PROVENTIL HFA;VENTOLIN HFA) 90 mcg/actuation inhaler Inhale 2 puffs every six (6) hours as needed for wheezing or shortness of breath. 1 Inhaler 0   ??? amitriptyline (ELAVIL) 25 MG tablet Take 1 tablet (25 mg total) by mouth three (3) times a day (at 6am, noon and 6pm). (Patient taking differently: Take 25 mg by mouth Two (2) times a day. ) 90 tablet 3   ??? aspirin (ADULT LOW DOSE ASPIRIN) 81 MG tablet Take 81 mg by mouth daily.      ??? atorvastatin (LIPITOR) 40 MG tablet TAKE ONE TABLET BY MOUTH DAILY (Patient taking differently: TAKE ONE TABLET BY MOUTH DAILY AT NIGHT) 30 tablet 11   ??? calcium-vitamin D (CALCIUM-VITAMIN D) 500 mg(1,250mg ) -200 unit per tablet Take 500 mg by mouth daily.      ??? cyclobenzaprine (FLEXERIL) 5 MG tablet Take 1-2 tablets (5-10 mg total) by mouth Three (3) times a day as needed for muscle spasms. 60 tablet 0   ??? DUREZOL 0.05 % Drop Administer 1 drop to both eyes daily as needed.      ??? multivitamin (PHARMACIST FAVORITE MULTI-VIT) per tablet Take 1 tablet by mouth daily. Frequency:QD   Dosage:1   TABLET  Instructions:  Note:Dose: 1 TABLET     ??? mycophenolate (MYFORTIC) 180 MG EC tablet Take 2 tablets (360 mg total) by mouth Two (2) times a day. Z94.0, DAW1, brand medically necessary 180 tablet 5   ??? omeprazole (PRILOSEC) 40  MG capsule TAKE ONE CAPSULE BY MOUTH TWICE A DAY 180 capsule 3   ??? polyethylene glycol (MIRALAX) 17 gram/dose powder Take 17 g by mouth daily. (Patient taking differently: Take 17 g by mouth daily as needed. ) 255 g 3   ??? predniSONE (DELTASONE) 10 MG tablet Take 1 tablet (10 mg total) by mouth daily. 30 tablet 1   ??? PROGRAF 1 mg capsule 5mg  in the am and 4mg  in the pm, DAW1, brand medically necessary, tx date 01/31/09, Z94.0, [facility] Transfer 270 capsule 5   ??? sodium bicarbonate 650 mg tablet Take 2 tablets (1,300 mg total) by mouth Two (2) times a day. 120 tablet 11   ??? sulfamethoxazole-trimethoprim (BACTRIM,SEPTRA) 400-80 mg per tablet Take 1 tablet (80 mg of trimethoprim total) by mouth 3 (three) times a week. 30 tablet 2   ??? valGANciclovir (VALCYTE) 450 mg tablet Take 1 tablet (450 mg total) by mouth daily. 30 tablet 2   ??? VIGAMOX 0.5 % ophthalmic solution Administer 1 drop to both eyes daily as needed.        Current Facility-Administered Medications on File Prior to Visit   Medication Dose Route Frequency Provider Last Rate Last Dose   ??? [DISCONTINUED] acetaminophen (TYLENOL) tablet 1,000 mg  1,000 mg Oral Q8H PRN Robert Bellow, MD       ??? [DISCONTINUED] albuterol (PROVENTIL HFA;VENTOLIN HFA) 90 mcg/actuation inhaler 2 puff  2 puff Inhalation Q6H PRN Clance Boll, MD       ??? [DISCONTINUED] amitriptyline (ELAVIL) tablet 25 mg  25 mg Oral BID Alexis Goodell, MD   25 mg at 03/13/17 0827   ??? [DISCONTINUED] calcium carbonate (OS-CAL) tablet 600 mg of elem calcium  600 mg of elem calcium Oral Daily Clance Boll, MD   600 mg of elem calcium at 03/13/17 0827   ??? [DISCONTINUED] cholecalciferol (vitamin D3) tablet 400 Units  400 Units Oral Daily Clance Boll, MD   400 Units at 03/13/17 0827   ??? [DISCONTINUED] diphenhydrAMINE (BENADRYL) capsule/tablet 25 mg  25 mg Oral Nightly PRN Randal Ewing Schlein, MD       ??? [DISCONTINUED] EPINEPHrine (EPIPEN) injection 0.3 mg  0.3 mg Intramuscular Daily PRN Randal Ewing Schlein, MD       ??? [DISCONTINUED] famotidine (PEPCID) injection 20 mg  20 mg Intravenous Q4H PRN Randal Ewing Schlein, MD       ??? [DISCONTINUED] methylPREDNISolone sodium succinate (PF) (Solu-MEDROL) injection 125 mg  125 mg Intravenous Q4H PRN Randal Ewing Schlein, MD       ??? [DISCONTINUED] multivitamins, therapeutic with minerals tablet 1 tablet  1 tablet Oral Daily Clance Boll, MD   1 tablet at 03/13/17 0827   ??? [DISCONTINUED] mycophenolate (MYFORTIC) EC tablet 180 mg  180 mg Oral BID Clayborne Artist, MD   180 mg at 03/13/17 1610   ??? [DISCONTINUED] omeprazole (PriLOSEC) capsule 40 mg  40 mg Oral BID Clance Boll, MD   40 mg at 03/13/17 0533   ??? [DISCONTINUED] polyethylene glycol (GLYCOLAX) powder (BULK CONTAINER) 17 g  17 g Oral Daily PRN Clance Boll, MD       ??? [DISCONTINUED] predniSONE (DELTASONE) tablet 10 mg  10 mg Oral Daily Robert Bellow, MD   10 mg at 03/13/17 9604   ??? [DISCONTINUED] sodium bicarbonate tablet 1,950 mg  1,950 mg Oral TID Edson Snowball, MD   1,950 mg at 03/13/17 0828   ??? [DISCONTINUED] sodium chloride (NS) 0.9 % infusion  20 mL/hr Intravenous Continuous PRN Randal Ewing Schlein, MD       ??? [DISCONTINUED] sodium chloride 0.9% (NS BOLUS) bolus 1,000 mL  1,000 mL Intravenous Daily PRN Randal Ewing Schlein, MD       ??? [DISCONTINUED] sulfamethoxazole-trimethoprim (BACTRIM,SEPTRA) 400-80 mg tablet 80 mg of trimethoprim  1 tablet Oral Once per day on Mon Wed Fri Clayborne Artist, MD   80 mg of trimethoprim at 03/13/17 6578   ??? [DISCONTINUED] tacrolimus (PROGRAF) capsule 4 mg  4 mg Oral Nightly (2000) Robert Bellow, MD   4 mg at 03/12/17 2009   ??? [DISCONTINUED] tacrolimus (PROGRAF) capsule 5 mg  5 mg Oral Daily Robert Bellow, MD   5 mg at 03/13/17 4696   ??? [DISCONTINUED] valGANciclovir (VALCYTE) tablet 450 mg  450 mg Oral Daily Alexis Goodell, MD   450 mg at 03/13/17 2952        ALLERGIES:      Oxycodone-acetaminophen         PAST MEDICAL HISTORY:     Past Medical History:   Diagnosis Date   ??? Anemia    ??? Chronic kidney disease    ??? CTS (carpal tunnel syndrome)    ??? Diabetes mellitus (CMS-HCC)     history prior to pancreas transplant-a1c 5.2 2016 without DM meds   ??? GERD (gastroesophageal reflux disease)    ??? Hyperlipidemia 01/31/2009   ??? Hypertension    ??? Kidney transplanted    ??? Pancreas transplanted (CMS-HCC)    ??? Pneumonia 2010       PAST SURGICAL HISTORY:     Past Surgical History:   Procedure Laterality Date   ??? CARPAL TUNNEL RELEASE     ??? CATARACT EXTRACTION, BILATERAL     ??? FOOT SURGERY     ??? KIDNEY AND PANCREAS TRANSPLANT 2010     ??? KIDNEY TRANSPLANT 1998     ??? PR ARTHRODESIS,ANKLE,OPEN Right 11/28/2016    Procedure: ARTHRODESIS, ANKLE, OPEN;  Surgeon: Garen Grams, MD;  Location: MAIN OR [facility];  Service: Ortho Trauma   ??? PR DEBRIDEMENT, SKIN, SUB-Q TISSUE,MUSCLE,BONE,=<20 SQ CM Right 02/22/2013 Procedure: DEBRIDEMENT; SKIN, SUBCUTANEOUS TISSUE, MUSCLE, & BONE FOOT;  Surgeon: Alben Deeds, MD;  Location: MAIN OR [facility];  Service: Vascular   ??? PR DEBRIDEMENT, SKIN, SUB-Q TISSUE,MUSCLE,BONE,=<20 SQ CM Right 06/10/2013    Procedure: DEBRIDEMENT; SKIN, SUBCUTANEOUS TISSUE, MUSCLE, & BONE;  Surgeon: Madelaine Bhat, DPM;  Location: ASC OR [facility];  Service: Vascular   ??? PR DEBRIDEMENT, SKIN, SUB-Q TISSUE,MUSCLE,BONE,=<20 SQ CM Right 07/22/2013    Procedure: DEBRIDEMENT; SKIN, SUBCUTANEOUS TISSUE, MUSCLE, & BONE;  Surgeon: Madelaine Bhat, DPM;  Location: ASC OR [facility];  Service: Vascular   ??? PR INSERTION DRUG IMPLANT DEVICE Right 09/03/2016    Procedure: Insertion, Non-Biodegradable Drug Delivery Implant;  Surgeon: Garen Grams, MD;  Location: MAIN OR [facility];  Service: Ortho Trauma   ??? PR OSTEOTOMY TIBIA Right 11/28/2016    Procedure: Osteotomy; Tibia;  Surgeon: Garen Grams, MD;  Location: MAIN OR [facility];  Service: Ortho Trauma   ??? PR PARTIAL REMOVAL OF TIBIA Right 09/03/2016    Procedure: Partial Excision, Bone; Tibia;  Surgeon: Garen Grams, MD;  Location: MAIN OR [facility];  Service: Ortho Trauma   ??? PR REMOVAL OF ANKLE IMPLANT Right 09/03/2016    Procedure: REMOVAL OF ANKLE IMPLANT;  Surgeon: Garen Grams, MD;  Location: MAIN OR [facility];  Service: Ortho Trauma   ??? SKIN GRAFTS     ???  TOE AMPUTATION      h/o 5th toe. 02/22/13 Great toe Right due to osteo       PROBLEM LIST:     Patient Active Problem List    Diagnosis Date Noted   ??? Metabolic acidosis 03/01/2017   ??? AKI (acute kidney injury) (CMS-HCC) 03/01/2017   ??? Elevated amylase and lipase 03/01/2017   ??? Acute pain of left shoulder 12/17/2016   ??? Closed fracture of ankle with malunion, right 11/20/2016   ??? Sinusitis 08/14/2016   ??? Right hip pain 04/27/2016   ??? Acute cystitis without hematuria 04/10/2016   ??? Acute pain of right shoulder 04/10/2016   ??? Acute viral conjunctivitis of left eye 04/10/2016   ??? Urinary tract infection 04/06/2016 ??? LGSIL on Pap smear of cervix 02/17/2016   ??? Screening for malignant neoplasm of cervix 02/06/2016   ??? Routine physical examination 02/05/2016   ??? Cervical paraspinal muscle spasm 01/11/2016   ??? Pain, dental 12/03/2015   ??? Cough 07/27/2015   ??? Pelvic pain in female 06/08/2015   ??? Muscle spasm 05/18/2015   ??? PAD (peripheral artery disease) (CMS-HCC) 09/03/2014   ??? Cellulitis and abscess of hand 08/09/2014   ??? Anemia in stage 3 chronic kidney disease (CMS-HCC) 05/01/2014   ??? Nausea 04/20/2014   ??? Charcot foot due to diabetes mellitus (CMS-HCC) 03/09/2014   ??? Ulcer of finger (CMS-HCC) 02/12/2014   ??? External hemorrhoid 01/25/2014   ??? Edema 01/01/2014   ??? Pain in limb 01/01/2014   ??? Ulcer of heel and midfoot (CMS-HCC) 09/11/2013   ??? Tension headache 09/07/2013   ??? Osteomyelitis (CMS-HCC) 07/31/2013   ??? 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 GERD (gastroesophageal reflux disease) 06/09/2013   ??? Hypertension 06/09/2013   ??? Hypoxia 03/01/2013   ??? Toe osteomyelitis, right (CMS-HCC) 03/01/2013   ??? Hyponatremia 03/01/2013   ??? Left lumbar radiculitis 07/01/2012   ??? Sacroiliitis (CMS-HCC) 04/29/2012   ??? Dyskinesia of esophagus 04/26/2011   ??? Trigger little finger of right hand 04/16/2011   ??? Dysphagia 07/21/2010   ??? 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 Gastroparesis 01/31/2009   ??? Hyperlipidemia 01/31/2009   ??? History of kidney transplant 10/13/1998   ??? History of diabetes as a child 01/09/1991           SOCIAL HISTORY:      reports that she has never smoked. She has never used smokeless tobacco. She reports that she does not drink alcohol or use drugs.   FAMILY HISTORY:     family history includes Arrhythmia in her brother; Breast cancer in her sister; Cancer in her maternal grandmother; Diabetes in her maternal aunt, maternal uncle, and mother; Hypertension in her brother and sister; No Known Problems in her maternal grandfather, paternal aunt, paternal grandfather, paternal grandmother, and paternal uncle.       Objective:    There were no vitals taken for this visit. - There is no height or weight on file to calculate BMI.   Physical exam:    ***         No results found for this or any previous visit (from the past 24 hour(s)).     ***attestation

## 2017-03-14 NOTE — Unmapped (Signed)
Patient still has plenty of medications - at least 2 1/2 weeks.  Rescheduled call for next week.

## 2017-03-19 ENCOUNTER — Other Ambulatory Visit
Admission: RE | Admit: 2017-03-19 | Discharge: 2017-03-19 | Disposition: A | Discharge status: Home / Self Care | Payer: Medicare Other | Source: Ambulatory Visit | Attending: Nephrology | Admitting: Nephrology

## 2017-03-19 DIAGNOSIS — E1129 Type 2 diabetes mellitus with other diabetic kidney complication: Secondary | ICD-10-CM | POA: Insufficient documentation

## 2017-03-19 DIAGNOSIS — Z789 Other specified health status: Secondary | ICD-10-CM | POA: Insufficient documentation

## 2017-03-19 DIAGNOSIS — B259 Cytomegaloviral disease, unspecified: Secondary | ICD-10-CM | POA: Insufficient documentation

## 2017-03-19 DIAGNOSIS — E559 Vitamin D deficiency, unspecified: Secondary | ICD-10-CM | POA: Insufficient documentation

## 2017-03-19 DIAGNOSIS — N390 Urinary tract infection, site not specified: Secondary | ICD-10-CM | POA: Diagnosis not present

## 2017-03-19 DIAGNOSIS — Z940 Kidney transplant status: Secondary | ICD-10-CM | POA: Insufficient documentation

## 2017-03-19 DIAGNOSIS — N189 Chronic kidney disease, unspecified: Secondary | ICD-10-CM | POA: Diagnosis not present

## 2017-03-19 DIAGNOSIS — Z09 Encounter for follow-up examination after completed treatment for conditions other than malignant neoplasm: Secondary | ICD-10-CM | POA: Insufficient documentation

## 2017-03-19 DIAGNOSIS — Z79899 Other long term (current) drug therapy: Secondary | ICD-10-CM | POA: Diagnosis not present

## 2017-03-19 DIAGNOSIS — D899 Disorder involving the immune mechanism, unspecified: Secondary | ICD-10-CM | POA: Insufficient documentation

## 2017-03-19 DIAGNOSIS — D631 Anemia in chronic kidney disease: Secondary | ICD-10-CM | POA: Insufficient documentation

## 2017-03-19 DIAGNOSIS — Z114 Encounter for screening for human immunodeficiency virus [HIV]: Secondary | ICD-10-CM | POA: Insufficient documentation

## 2017-03-19 DIAGNOSIS — Z9483 Pancreas transplant status: Secondary | ICD-10-CM | POA: Diagnosis not present

## 2017-03-19 LAB — CBC WITH DIFFERENTIAL/PLATELET
BASOS ABS: 0.1 10*3/uL (ref 0–0.1)
Basophils Relative: 1 %
EOS PCT: 2 %
Eosinophils Absolute: 0.1 10*3/uL (ref 0–0.7)
HCT: 22.9 % — ABNORMAL LOW (ref 35.0–47.0)
HEMOGLOBIN: 7.2 g/dL — AB (ref 12.0–16.0)
LYMPHS ABS: 0 10*3/uL — AB (ref 1.0–3.6)
Lymphocytes Relative: 1 %
MCH: 30 pg (ref 26.0–34.0)
MCHC: 31.6 g/dL — ABNORMAL LOW (ref 32.0–36.0)
MCV: 94.7 fL (ref 80.0–100.0)
Monocytes Absolute: 0.3 10*3/uL (ref 0.2–0.9)
Monocytes Relative: 4 %
NEUTROS PCT: 92 %
Neutro Abs: 6.8 10*3/uL — ABNORMAL HIGH (ref 1.4–6.5)
PLATELETS: 208 10*3/uL (ref 150–440)
RBC: 2.42 MIL/uL — AB (ref 3.80–5.20)
RDW: 15.6 % — ABNORMAL HIGH (ref 11.5–14.5)
WBC: 7.3 10*3/uL (ref 3.6–11.0)

## 2017-03-19 LAB — BASIC METABOLIC PANEL
ANION GAP: 8 (ref 5–15)
BUN: 26 mg/dL — ABNORMAL HIGH (ref 6–20)
CO2: 16 mmol/L — ABNORMAL LOW (ref 22–32)
Calcium: 8.3 mg/dL — ABNORMAL LOW (ref 8.9–10.3)
Chloride: 114 mmol/L — ABNORMAL HIGH (ref 101–111)
Creatinine, Ser: 1.35 mg/dL — ABNORMAL HIGH (ref 0.44–1.00)
GFR, EST AFRICAN AMERICAN: 53 mL/min — AB (ref >60)
GFR, EST NON AFRICAN AMERICAN: 46 mL/min — AB (ref >60)
GLUCOSE: 97 mg/dL (ref 65–99)
POTASSIUM: 4.9 mmol/L (ref 3.5–5.1)
Sodium: 138 mmol/L (ref 135–145)

## 2017-03-19 LAB — MAGNESIUM: MAGNESIUM: 1.7 mg/dL (ref 1.7–2.4)

## 2017-03-19 LAB — PHOSPHORUS: Phosphorus: 3.2 mg/dL (ref 2.5–4.6)

## 2017-03-19 NOTE — Unmapped (Signed)
[place] [facility] ([facility])                                  RECRUITMENT SUMMARY NOTE     ?? Called patient today to introduce [facility] as part of [facility]  ?? Referral:Yes TOC  ?? Reason for Referral:Transitions of Care Med Rec  ?? Call attempt:1st attempt  ?? Outcome of call: patient answered and requested call at another time  ?? Discussed the following: Program Services  ?? Program status: Outreach  ??    Cathleen Corti, CPhT  Clinical Operations Specialist   [place] [facility] - [facility]

## 2017-03-20 LAB — CBC W/ DIFFERENTIAL
BASOPHILS ABSOLUTE COUNT: 0.1 10*9/L
BASOPHILS RELATIVE PERCENT: 1 %
EOSINOPHILS ABSOLUTE COUNT: 0.1 10*9/L
EOSINOPHILS RELATIVE PERCENT: 2 %
HEMATOCRIT: 22.9 % — ABNORMAL LOW
LYMPHOCYTES ABSOLUTE COUNT: 0 10*9/L — ABNORMAL LOW
LYMPHOCYTES RELATIVE PERCENT: 1 %
MEAN CORPUSCULAR HEMOGLOBIN CONC: 31.6 g/dL — ABNORMAL LOW
MEAN CORPUSCULAR HEMOGLOBIN: 30 pg
MONOCYTES ABSOLUTE COUNT: 0.3 10*9/L
MONOCYTES RELATIVE PERCENT: 4 %
NEUTROPHILS ABSOLUTE COUNT: 6.8 10*9/L — ABNORMAL HIGH
NEUTROPHILS RELATIVE PERCENT: 92 %
PLATELET COUNT: 208 10*9/L
RED BLOOD CELL COUNT: 2.42 10*12/L — ABNORMAL LOW
RED CELL DISTRIBUTION WIDTH: 15.6 % — ABNORMAL HIGH
WBC ADJUSTED: 7.3 10*9/L

## 2017-03-20 LAB — BASIC METABOLIC PANEL
CALCIUM: 8.3 mg/dL — ABNORMAL LOW
CO2: 16 mmol/L — ABNORMAL LOW
EGFR MDRD AF AMER: 53 mL/min/{1.73_m2} — ABNORMAL LOW
GLUCOSE RANDOM: 97 mg/dL
POTASSIUM: 4.9 mmol/L

## 2017-03-20 LAB — MAGNESIUM: Lab: 1.7

## 2017-03-20 LAB — CHLORIDE: Lab: 114 — ABNORMAL HIGH

## 2017-03-20 LAB — PHOSPHORUS: Lab: 3.2

## 2017-03-20 LAB — SLIDE SCAN: Lab: 0

## 2017-03-20 NOTE — Unmapped (Signed)
[place] [facility] ([facility])                                  RECRUITMENT SUMMARY NOTE     ?? Called patient today to introduce [facility] as part of [facility]  ?? Referral:Yes TOC  ?? Reason for Referral:Transitions of Care Med Rec  ?? Call attempt:2nd attempt  ?? Outcome of call: no answer, voicemail left to return call  ?? Discussed the following: Program Services  ?? Program status: Unable to contact  ??    Rachel [name] A. Rachel [name]   Clinical Operations Specialist/CPHT  [place] [facility] ([facility])

## 2017-03-20 NOTE — Unmapped (Signed)
Pt is going to come in on 04/04/17 for a repeat kidney biopsy.  She will come to the [facility] hospital between 7 and 730 am .  She will have a driver and be NPO after midnight and hold any aspirin.    US Renal Transplant Biopsy Note  Date of Scheduled Biopsy: 04/04/2017  Rush?: Yes  Patient Name: Rachel [name]  MR: [id number]  Age: 47 y.o.  Gender: Female  Race: African American  Procedures: Ultrasound Guided Percutaneous Transplant Kidney Biopsy under Moderate Sedation  Tissue Submitted: Kidney  Special Studies Required: LM, IF, EM  ----------------------------------------------------------------------------------------------------------------------  Date of allograft implantation: 01/31/2009  Underlying native kidney disease: DM  Was the diagnosis established by biopsy? yes   Previous transplant biopsies? yes   If yes, what were the previous diagnoses?   Previous kidney transplants: yes If yes, this is #:   History/Clinical Diagnosis/Indication for Biopsy: hx of rejection, follow up  ----------------------------------------------------------------------------------------------------------------------  Current Baseline Immunosuppression: FK506/Tacrolimus  Specific anti-rejection treatment before biopsy: no   If yes, what was the type of treatment?   Patient off immunosuppression?: no   Patient seems compliant? yes   Patient is currently back on hemodialysis? no   ----------------------------------------------------------------------------------------------------------------------  Evidence of allo-antibodies? no   If yes, HLA type/MFI?:   Blood Pressure (mmHg):   BP Readings from Last 3 Encounters:   03/13/17 134/58   03/01/17 124/62   12/17/16 143/76     Urinalysis:   Lab Results   Component Value Date    COLORU Yellow 03/01/2017    COLORU Yellow 04/06/2016    COLORU Yellow 03/17/2014    SPECGRAV 1.013 03/01/2017    SPECGRAV 1.020 04/06/2016    PHUR 6.5 03/01/2017    PHUR 6.0 04/06/2016    PHUR 5.5 03/17/2014 GLUCOSEU Negative 03/01/2017    GLUCOSEU NEGATIVE 03/17/2014    KETONESU Negative 03/01/2017    KETONESU NEGATIVE 03/17/2014    BLOODU Negative 03/01/2017    BLOODU NEGATIVE 03/17/2014    NITRITE Negative 03/01/2017    NITRITE Positive 04/06/2016    LEUKOCYTESUR Negative 03/01/2017    LEUKOCYTESUR 1+ 04/06/2016    LEUKOCYTESUR +2 03/17/2014    UROBILINOGEN 0.2 mg/dL 81/19/1478    UROBILINOGEN 0.2 mg/dL 29/56/2130    UROBILINOGEN 1 03/17/2014    BILIRUBINUR Negative 03/01/2017    BILIRUBINUR Negative 04/06/2016    BILIRUBINUR NEGATIVE 03/17/2014    WBCU 11 (H) 03/17/2014    RBCU 1 03/17/2014     Urine protein/creatinine ratio: 0.4  Creatinine (present peak): 1.4  Creatinine (baseline): 1.4  Lab Results   Component Value Date    CREATININE 1.35 (H) 03/19/2017    CREATININE 1.46 (H) 03/13/2017    CREATININE 1.47 (H) 03/12/2017    CREATININE 1.40 (H) 03/11/2017    CREATININE 1.54 (H) 03/10/2017     ----------------------------------------------------------------------------------------------------------------------  Clinical signs of infections at time of current biopsy:  BK: no  BK blood viral load:   BK urine viral load:   CMV: no  CMV viral load:   Herpes: no   Hepatitis B: no   Hepatitis C: no   Bacteria: no   Fungi: no   Urinary tract infection: no   Other:   ----------------------------------------------------------------------------------------------------------------------  Stenosis of renal artery: no   Obstruction of ureter: no   Lymphocele: no   ----------------------------------------------------------------------------------------------------------------------  Donor Information (if available)  Age of donor:   Gender:   Race:  Type of donor: Cadaveric  Ischemia time:   Delayed graft function: no  If yes, how many days on hemodialysis:     4210 SURGICAL PATHOLOGY REQUEST FORM  DATE      [facility] Test#  CPT Code Description   4250  88331  FROZEN SECTION - SINGLE   4251  88332  FROZEN SECTIO - ADDITIONAL 4227  88300  GROSS ONLY (NO SECTION)   4228  88302  LEVEL II - GROSS & MICRO EXAM   4229  88304  LEVEL III - GROSS & MICRO EXAM   4230  88305  LEVEL IV - GROSS & MICRO EXAM   4231  88307  LEVEL V - GROSS & MICRO EXAM   4232  88309  LEVEL VI - GROSS & MICRO EXAM   4233  88311  DECALCIFICATION   4234  88321  CONSULT ON REFERRED SLIDES   4235  88323  CONSULT WITH SLIDE PREP   4236  88329  CONSULT DURING SURGERY   4237  [phone]  BONE MARROW ASPIRATION     DEPT. USE ONLY     CODE QTY MISCELLANEOUS (SPECIFY) AMOUNT

## 2017-03-21 ENCOUNTER — Other Ambulatory Visit
Admission: RE | Admit: 2017-03-21 | Discharge: 2017-03-21 | Disposition: A | Discharge status: Home / Self Care | Payer: Medicare Other | Source: Ambulatory Visit | Attending: Nephrology | Admitting: Nephrology

## 2017-03-21 DIAGNOSIS — E559 Vitamin D deficiency, unspecified: Secondary | ICD-10-CM | POA: Insufficient documentation

## 2017-03-21 DIAGNOSIS — B259 Cytomegaloviral disease, unspecified: Secondary | ICD-10-CM | POA: Insufficient documentation

## 2017-03-21 DIAGNOSIS — E1129 Type 2 diabetes mellitus with other diabetic kidney complication: Secondary | ICD-10-CM | POA: Diagnosis not present

## 2017-03-21 DIAGNOSIS — Z789 Other specified health status: Secondary | ICD-10-CM | POA: Diagnosis not present

## 2017-03-21 DIAGNOSIS — Z114 Encounter for screening for human immunodeficiency virus [HIV]: Secondary | ICD-10-CM | POA: Diagnosis not present

## 2017-03-21 DIAGNOSIS — N390 Urinary tract infection, site not specified: Secondary | ICD-10-CM | POA: Diagnosis not present

## 2017-03-21 DIAGNOSIS — Z9483 Pancreas transplant status: Secondary | ICD-10-CM | POA: Insufficient documentation

## 2017-03-21 DIAGNOSIS — T8610 Unspecified complication of kidney transplant: Secondary | ICD-10-CM | POA: Insufficient documentation

## 2017-03-21 DIAGNOSIS — Z09 Encounter for follow-up examination after completed treatment for conditions other than malignant neoplasm: Secondary | ICD-10-CM | POA: Diagnosis not present

## 2017-03-21 DIAGNOSIS — Z940 Kidney transplant status: Secondary | ICD-10-CM | POA: Insufficient documentation

## 2017-03-21 DIAGNOSIS — D899 Disorder involving the immune mechanism, unspecified: Secondary | ICD-10-CM | POA: Insufficient documentation

## 2017-03-21 DIAGNOSIS — D631 Anemia in chronic kidney disease: Secondary | ICD-10-CM | POA: Diagnosis not present

## 2017-03-21 DIAGNOSIS — N189 Chronic kidney disease, unspecified: Secondary | ICD-10-CM | POA: Insufficient documentation

## 2017-03-21 DIAGNOSIS — Z79899 Other long term (current) drug therapy: Secondary | ICD-10-CM | POA: Insufficient documentation

## 2017-03-21 LAB — CBC WITH DIFFERENTIAL/PLATELET
BASOS ABS: 0 10*3/uL (ref 0–0.1)
BASOS PCT: 1 %
EOS ABS: 0.2 10*3/uL (ref 0–0.7)
EOS PCT: 4 %
HCT: 23.3 % — ABNORMAL LOW (ref 35.0–47.0)
Hemoglobin: 7.5 g/dL — ABNORMAL LOW (ref 12.0–16.0)
LYMPHS ABS: 0 10*3/uL — AB (ref 1.0–3.6)
Lymphocytes Relative: 0 %
MCH: 30.5 pg (ref 26.0–34.0)
MCHC: 32.2 g/dL (ref 32.0–36.0)
MCV: 94.6 fL (ref 80.0–100.0)
Monocytes Absolute: 0.2 10*3/uL (ref 0.2–0.9)
Monocytes Relative: 5 %
NEUTROS PCT: 90 %
Neutro Abs: 3.5 10*3/uL (ref 1.4–6.5)
PLATELETS: 204 10*3/uL (ref 150–440)
RBC: 2.47 MIL/uL — ABNORMAL LOW (ref 3.80–5.20)
RDW: 15.6 % — ABNORMAL HIGH (ref 11.5–14.5)
WBC: 3.9 10*3/uL (ref 3.6–11.0)

## 2017-03-21 LAB — BASIC METABOLIC PANEL
ANION GAP: 5 (ref 5–15)
BUN: 24 mg/dL — ABNORMAL HIGH (ref 6–20)
CALCIUM: 8.6 mg/dL — AB (ref 8.9–10.3)
CALCIUM: 8.6 mg/dL — ABNORMAL LOW
CHLORIDE: 116 mmol/L — ABNORMAL HIGH
CO2: 19 mmol/L — ABNORMAL LOW (ref 22–32)
CO2: 19 mmol/L — ABNORMAL LOW
Chloride: 116 mmol/L — ABNORMAL HIGH (ref 101–111)
Creatinine, Ser: 1.49 mg/dL — ABNORMAL HIGH (ref 0.44–1.00)
EGFR MDRD AF AMER: 47 mL/min/{1.73_m2} — ABNORMAL LOW
GFR, EST AFRICAN AMERICAN: 47 mL/min — AB (ref >60)
GFR, EST NON AFRICAN AMERICAN: 41 mL/min — AB (ref >60)
GLUCOSE: 93 mg/dL (ref 65–99)
POTASSIUM: 5.7 mmol/L — AB (ref 3.5–5.1)
POTASSIUM: 5.7 mmol/L — ABNORMAL HIGH
SODIUM: 140 mmol/L (ref 135–145)
SODIUM: 140 mmol/L

## 2017-03-21 LAB — MAGNESIUM
Lab: 1.8
MAGNESIUM: 1.8 mg/dL (ref 1.7–2.4)

## 2017-03-21 LAB — LIPASE, BLOOD: LIPASE: 45 U/L (ref 11–51)

## 2017-03-21 LAB — AMYLASE
AMYLASE: 78 U/L (ref 28–100)
Lab: 78

## 2017-03-21 LAB — PHOSPHORUS
Lab: 3.7
PHOSPHORUS: 3.7 mg/dL (ref 2.5–4.6)

## 2017-03-21 LAB — CBC W/ DIFFERENTIAL
BASOPHILS ABSOLUTE COUNT: 0 10*9/L
BASOPHILS RELATIVE PERCENT: 1 %
EOSINOPHILS ABSOLUTE COUNT: 0.2 10*9/L
EOSINOPHILS RELATIVE PERCENT: 4 %
HEMATOCRIT: 23.3 % — ABNORMAL LOW
HEMOGLOBIN: 7.5 g/dL — ABNORMAL LOW
LYMPHOCYTES ABSOLUTE COUNT: 0 10*9/L — ABNORMAL LOW
LYMPHOCYTES RELATIVE PERCENT: 0 %
MEAN CORPUSCULAR HEMOGLOBIN CONC: 32.2 g/dL
MEAN CORPUSCULAR HEMOGLOBIN: 30.5 pg
MEAN CORPUSCULAR VOLUME: 94.6 fL
MONOCYTES ABSOLUTE COUNT: 0.2 10*9/L
NEUTROPHILS ABSOLUTE COUNT: 3.5 10*9/L
NEUTROPHILS RELATIVE PERCENT: 90 %
PLATELET COUNT: 204 10*9/L
RED BLOOD CELL COUNT: 2.47 10*12/L — ABNORMAL LOW
RED CELL DISTRIBUTION WIDTH: 15.6 % — ABNORMAL HIGH
WHITE BLOOD CELL COUNT: 3.9 10*9/L

## 2017-03-21 LAB — ANISOCYTOSIS: Lab: 0

## 2017-03-21 LAB — LIPASE: Lab: 45

## 2017-03-21 LAB — CO2: Lab: 19 — ABNORMAL LOW

## 2017-03-26 ENCOUNTER — Encounter: Admit: 2017-03-26 | Discharge: 2017-03-26 | Payer: MEDICAID | Attending: Nephrology | Primary: Nephrology

## 2017-03-26 ENCOUNTER — Other Ambulatory Visit
Admission: RE | Admit: 2017-03-26 | Discharge: 2017-03-26 | Disposition: A | Discharge status: Home / Self Care | Payer: Medicare Other | Source: Ambulatory Visit | Attending: Nephrology | Admitting: Nephrology

## 2017-03-26 DIAGNOSIS — Z09 Encounter for follow-up examination after completed treatment for conditions other than malignant neoplasm: Secondary | ICD-10-CM | POA: Insufficient documentation

## 2017-03-26 DIAGNOSIS — D631 Anemia in chronic kidney disease: Secondary | ICD-10-CM | POA: Diagnosis not present

## 2017-03-26 DIAGNOSIS — X58XXXA Exposure to other specified factors, initial encounter: Secondary | ICD-10-CM | POA: Insufficient documentation

## 2017-03-26 DIAGNOSIS — Z789 Other specified health status: Secondary | ICD-10-CM | POA: Diagnosis not present

## 2017-03-26 DIAGNOSIS — Z940 Kidney transplant status: Secondary | ICD-10-CM | POA: Insufficient documentation

## 2017-03-26 DIAGNOSIS — T8610 Unspecified complication of kidney transplant: Secondary | ICD-10-CM | POA: Diagnosis not present

## 2017-03-26 DIAGNOSIS — N390 Urinary tract infection, site not specified: Secondary | ICD-10-CM | POA: Diagnosis not present

## 2017-03-26 DIAGNOSIS — N189 Chronic kidney disease, unspecified: Secondary | ICD-10-CM | POA: Insufficient documentation

## 2017-03-26 DIAGNOSIS — Z114 Encounter for screening for human immunodeficiency virus [HIV]: Secondary | ICD-10-CM | POA: Diagnosis not present

## 2017-03-26 DIAGNOSIS — E559 Vitamin D deficiency, unspecified: Secondary | ICD-10-CM | POA: Insufficient documentation

## 2017-03-26 DIAGNOSIS — Z79899 Other long term (current) drug therapy: Secondary | ICD-10-CM | POA: Insufficient documentation

## 2017-03-26 DIAGNOSIS — E1129 Type 2 diabetes mellitus with other diabetic kidney complication: Secondary | ICD-10-CM | POA: Diagnosis not present

## 2017-03-26 LAB — CBC WITH DIFFERENTIAL/PLATELET
Basophils Absolute: 0 10*3/uL (ref 0–0.1)
Basophils Relative: 1 %
Eosinophils Absolute: 0.5 10*3/uL (ref 0–0.7)
Eosinophils Relative: 16 %
HEMATOCRIT: 25.8 % — AB (ref 35.0–47.0)
HEMOGLOBIN: 8.2 g/dL — AB (ref 12.0–16.0)
LYMPHS ABS: 0 10*3/uL — AB (ref 1.0–3.6)
Lymphocytes Relative: 1 %
MCH: 30.7 pg (ref 26.0–34.0)
MCHC: 31.7 g/dL — AB (ref 32.0–36.0)
MCV: 97.1 fL (ref 80.0–100.0)
MONOS PCT: 5 %
Monocytes Absolute: 0.2 10*3/uL (ref 0.2–0.9)
NEUTROS ABS: 2.6 10*3/uL (ref 1.4–6.5)
Neutrophils Relative %: 77 %
Platelets: 195 10*3/uL (ref 150–440)
RBC: 2.66 MIL/uL — ABNORMAL LOW (ref 3.80–5.20)
RDW: 17.1 % — ABNORMAL HIGH (ref 11.5–14.5)
WBC: 3.4 10*3/uL — ABNORMAL LOW (ref 3.6–11.0)

## 2017-03-26 LAB — BASIC METABOLIC PANEL
Anion gap: 6 (ref 5–15)
BUN: 40 mg/dL — AB (ref 6–20)
CHLORIDE: 118 mmol/L — AB (ref 101–111)
CO2: 16 mmol/L — ABNORMAL LOW (ref 22–32)
Calcium: 8.5 mg/dL — ABNORMAL LOW (ref 8.9–10.3)
Creatinine, Ser: 1.68 mg/dL — ABNORMAL HIGH (ref 0.44–1.00)
GFR calc Af Amer: 41 mL/min — ABNORMAL LOW (ref >60)
GFR calc non Af Amer: 35 mL/min — ABNORMAL LOW (ref >60)
GLUCOSE: 84 mg/dL (ref 65–99)
POTASSIUM: 4.8 mmol/L (ref 3.5–5.1)
Sodium: 140 mmol/L (ref 135–145)

## 2017-03-26 LAB — LIPASE, BLOOD: Lipase: 69 U/L — ABNORMAL HIGH (ref 11–51)

## 2017-03-26 LAB — AMYLASE: Amylase: 88 U/L (ref 28–100)

## 2017-03-26 LAB — MAGNESIUM: Magnesium: 2 mg/dL (ref 1.7–2.4)

## 2017-03-26 LAB — PHOSPHORUS: Phosphorus: 4 mg/dL (ref 2.5–4.6)

## 2017-03-27 LAB — CBC W/ DIFFERENTIAL
BASOPHILS ABSOLUTE COUNT: 0 10*9/L
BASOPHILS RELATIVE PERCENT: 1 %
EOSINOPHILS ABSOLUTE COUNT: 0.5 10*9/L
EOSINOPHILS RELATIVE PERCENT: 16 %
HEMOGLOBIN: 8.2 g/dL — ABNORMAL LOW
LYMPHOCYTES ABSOLUTE COUNT: 0 10*9/L — ABNORMAL LOW
LYMPHOCYTES RELATIVE PERCENT: 1 %
MEAN CORPUSCULAR HEMOGLOBIN CONC: 31.7 g/dL — ABNORMAL LOW
MEAN CORPUSCULAR HEMOGLOBIN: 30.7 pg
MEAN CORPUSCULAR VOLUME: 97.1 fL
MONOCYTES ABSOLUTE COUNT: 0.2 10*9/L
MONOCYTES RELATIVE PERCENT: 5 %
NEUTROPHILS ABSOLUTE COUNT: 2.6 10*9/L
PLATELET COUNT: 195 10*9/L
RED BLOOD CELL COUNT: 2.66 10*12/L — ABNORMAL LOW
WBC ADJUSTED: 3.4 10*9/L — ABNORMAL LOW

## 2017-03-27 LAB — BASIC METABOLIC PANEL
BLOOD UREA NITROGEN: 40 mg/dL — ABNORMAL HIGH
CALCIUM: 8.5 mg/dL — ABNORMAL LOW
CHLORIDE: 118 mmol/L — ABNORMAL HIGH
CO2: 16 mmol/L — ABNORMAL LOW
CREATININE: 1.68 mg/dL — ABNORMAL HIGH
POTASSIUM: 4.8 mmol/L
SODIUM: 140 mmol/L

## 2017-03-27 LAB — MAGNESIUM: Lab: 2

## 2017-03-27 LAB — AMYLASE
AMYLASE: 88 U/L
Lab: 88

## 2017-03-27 LAB — CHLORIDE: Lab: 118 — ABNORMAL HIGH

## 2017-03-27 LAB — SLIDE SCAN: Lab: 0

## 2017-03-27 LAB — PHOSPHORUS: Lab: 4

## 2017-03-27 LAB — LIPASE: Lab: 69 — ABNORMAL HIGH

## 2017-03-28 ENCOUNTER — Encounter: Admit: 2017-03-28 | Discharge: 2017-03-28 | Payer: MEDICAID | Attending: Nephrology | Primary: Nephrology

## 2017-03-28 ENCOUNTER — Other Ambulatory Visit
Admission: RE | Admit: 2017-03-28 | Discharge: 2017-03-28 | Disposition: A | Discharge status: Home / Self Care | Payer: Medicare Other | Source: Ambulatory Visit | Attending: Nephrology | Admitting: Nephrology

## 2017-03-28 DIAGNOSIS — E1129 Type 2 diabetes mellitus with other diabetic kidney complication: Secondary | ICD-10-CM | POA: Diagnosis not present

## 2017-03-28 DIAGNOSIS — D631 Anemia in chronic kidney disease: Secondary | ICD-10-CM | POA: Insufficient documentation

## 2017-03-28 DIAGNOSIS — Z789 Other specified health status: Secondary | ICD-10-CM | POA: Insufficient documentation

## 2017-03-28 DIAGNOSIS — Z114 Encounter for screening for human immunodeficiency virus [HIV]: Secondary | ICD-10-CM | POA: Diagnosis not present

## 2017-03-28 DIAGNOSIS — Z79899 Other long term (current) drug therapy: Secondary | ICD-10-CM | POA: Diagnosis present

## 2017-03-28 DIAGNOSIS — Z940 Kidney transplant status: Secondary | ICD-10-CM | POA: Diagnosis present

## 2017-03-28 DIAGNOSIS — E559 Vitamin D deficiency, unspecified: Secondary | ICD-10-CM | POA: Insufficient documentation

## 2017-03-28 DIAGNOSIS — N390 Urinary tract infection, site not specified: Secondary | ICD-10-CM | POA: Diagnosis not present

## 2017-03-28 LAB — CBC WITH DIFFERENTIAL/PLATELET
Basophils Absolute: 0 10*3/uL (ref 0–0.1)
Basophils Relative: 1 %
Eosinophils Absolute: 0.6 10*3/uL (ref 0–0.7)
Eosinophils Relative: 16 %
HCT: 25.9 % — ABNORMAL LOW (ref 35.0–47.0)
HEMOGLOBIN: 8.2 g/dL — AB (ref 12.0–16.0)
LYMPHS ABS: 0.1 10*3/uL — AB (ref 1.0–3.6)
LYMPHS PCT: 2 %
MCH: 30.6 pg (ref 26.0–34.0)
MCHC: 31.7 g/dL — ABNORMAL LOW (ref 32.0–36.0)
MCV: 96.8 fL (ref 80.0–100.0)
Monocytes Absolute: 0.2 10*3/uL (ref 0.2–0.9)
Monocytes Relative: 6 %
NEUTROS PCT: 75 %
Neutro Abs: 2.7 10*3/uL (ref 1.4–6.5)
Platelets: 200 10*3/uL (ref 150–440)
RBC: 2.67 MIL/uL — AB (ref 3.80–5.20)
RDW: 18.1 % — ABNORMAL HIGH (ref 11.5–14.5)
WBC: 3.6 10*3/uL (ref 3.6–11.0)

## 2017-03-28 LAB — BASIC METABOLIC PANEL
ANION GAP: 7 (ref 5–15)
BUN: 42 mg/dL — ABNORMAL HIGH (ref 6–20)
CALCIUM: 8.7 mg/dL — AB (ref 8.9–10.3)
CO2: 15 mmol/L — AB (ref 22–32)
Chloride: 118 mmol/L — ABNORMAL HIGH (ref 101–111)
Creatinine, Ser: 1.8 mg/dL — ABNORMAL HIGH (ref 0.44–1.00)
GFR calc Af Amer: 38 mL/min — ABNORMAL LOW (ref >60)
GFR calc non Af Amer: 32 mL/min — ABNORMAL LOW (ref >60)
GLUCOSE: 93 mg/dL (ref 65–99)
Potassium: 4.8 mmol/L (ref 3.5–5.1)
Sodium: 140 mmol/L (ref 135–145)

## 2017-03-28 LAB — PHOSPHORUS: Phosphorus: 4.5 mg/dL (ref 2.5–4.6)

## 2017-03-28 LAB — MAGNESIUM: Magnesium: 1.9 mg/dL (ref 1.7–2.4)

## 2017-04-01 DIAGNOSIS — Z940 Kidney transplant status: Principal | ICD-10-CM

## 2017-04-01 LAB — TACROLIMUS, TROUGH
Lab: 4.5
Lab: 6

## 2017-04-01 NOTE — Unmapped (Signed)
Received phone call from patient. Patient reported that she is on her way to an eye appointment, and she will contact this nurse on 04/02/17 to discuss medication reconciliation post discharge.          Documented by: Lacey Jensen, RN  [facility]

## 2017-04-01 NOTE — Unmapped (Signed)
First attempt made to contact patient to discuss medication reconciliation post discharge. Unable to leave a message due to no voicemail setup. Will contact patient again later.         Documented by: Lacey Jensen, RN  [facility]

## 2017-04-02 ENCOUNTER — Encounter: Admit: 2017-04-02 | Discharge: 2017-04-02 | Payer: MEDICAID | Attending: Nephrology | Primary: Nephrology

## 2017-04-02 ENCOUNTER — Other Ambulatory Visit
Admission: RE | Admit: 2017-04-02 | Discharge: 2017-04-02 | Disposition: A | Discharge status: Home / Self Care | Payer: Medicare Other | Source: Ambulatory Visit | Attending: Nephrology | Admitting: Nephrology

## 2017-04-02 DIAGNOSIS — E1129 Type 2 diabetes mellitus with other diabetic kidney complication: Secondary | ICD-10-CM | POA: Insufficient documentation

## 2017-04-02 DIAGNOSIS — D631 Anemia in chronic kidney disease: Secondary | ICD-10-CM | POA: Insufficient documentation

## 2017-04-02 DIAGNOSIS — Z79899 Other long term (current) drug therapy: Secondary | ICD-10-CM | POA: Diagnosis not present

## 2017-04-02 DIAGNOSIS — E559 Vitamin D deficiency, unspecified: Secondary | ICD-10-CM | POA: Insufficient documentation

## 2017-04-02 DIAGNOSIS — N189 Chronic kidney disease, unspecified: Secondary | ICD-10-CM | POA: Diagnosis not present

## 2017-04-02 DIAGNOSIS — Z940 Kidney transplant status: Secondary | ICD-10-CM | POA: Diagnosis not present

## 2017-04-02 LAB — CBC WITH DIFFERENTIAL/PLATELET
BASOS ABS: 0 10*3/uL (ref 0–0.1)
Basophils Relative: 1 %
EOS ABS: 0.3 10*3/uL (ref 0–0.7)
EOS PCT: 5 %
HCT: 25.4 % — ABNORMAL LOW (ref 35.0–47.0)
Hemoglobin: 8.1 g/dL — ABNORMAL LOW (ref 12.0–16.0)
LYMPHS PCT: 2 %
Lymphs Abs: 0.1 10*3/uL — ABNORMAL LOW (ref 1.0–3.6)
MCH: 30.5 pg (ref 26.0–34.0)
MCHC: 31.6 g/dL — ABNORMAL LOW (ref 32.0–36.0)
MCV: 96.3 fL (ref 80.0–100.0)
MONO ABS: 0.4 10*3/uL (ref 0.2–0.9)
Monocytes Relative: 8 %
Neutro Abs: 4.5 10*3/uL (ref 1.4–6.5)
Neutrophils Relative %: 84 %
PLATELETS: 238 10*3/uL (ref 150–440)
RBC: 2.64 MIL/uL — ABNORMAL LOW (ref 3.80–5.20)
RDW: 18.5 % — AB (ref 11.5–14.5)
WBC: 5.3 10*3/uL (ref 3.6–11.0)

## 2017-04-02 LAB — TRIGLYCERIDES: Triglycerides: 44 mg/dL (ref <150)

## 2017-04-02 LAB — AST: AST: 15 U/L (ref 15–41)

## 2017-04-02 LAB — GAMMA GT: GGT: 14 U/L (ref 7–50)

## 2017-04-02 LAB — LIPASE, BLOOD: Lipase: 68 U/L — ABNORMAL HIGH (ref 11–51)

## 2017-04-02 LAB — BASIC METABOLIC PANEL
Anion gap: 8 (ref 5–15)
BUN: 39 mg/dL — AB (ref 6–20)
CALCIUM: 8.7 mg/dL — AB (ref 8.9–10.3)
CO2: 15 mmol/L — ABNORMAL LOW (ref 22–32)
CREATININE: 1.82 mg/dL — AB (ref 0.44–1.00)
Chloride: 118 mmol/L — ABNORMAL HIGH (ref 101–111)
GFR calc Af Amer: 37 mL/min — ABNORMAL LOW (ref >60)
GFR, EST NON AFRICAN AMERICAN: 32 mL/min — AB (ref >60)
GLUCOSE: 92 mg/dL (ref 65–99)
POTASSIUM: 4.9 mmol/L (ref 3.5–5.1)
Sodium: 141 mmol/L (ref 135–145)

## 2017-04-02 LAB — ALKALINE PHOSPHATASE: ALK PHOS: 90 U/L (ref 38–126)

## 2017-04-02 LAB — CHOLESTEROL, TOTAL: CHOLESTEROL: 177 mg/dL (ref 0–200)

## 2017-04-02 LAB — HEMOGLOBIN A1C
HEMOGLOBIN A1C: 5.4 % (ref 4.8–5.6)
MEAN PLASMA GLUCOSE: 108.28 mg/dL

## 2017-04-02 LAB — HDL CHOLESTEROL: HDL: 74 mg/dL (ref >40)

## 2017-04-02 LAB — ALT: ALT: 16 U/L (ref 14–54)

## 2017-04-02 LAB — AMYLASE: Amylase: 90 U/L (ref 28–100)

## 2017-04-02 LAB — PHOSPHORUS: Phosphorus: 4.2 mg/dL (ref 2.5–4.6)

## 2017-04-02 LAB — BILIRUBIN, TOTAL: Total Bilirubin: 0.5 mg/dL (ref 0.3–1.2)

## 2017-04-02 LAB — ALBUMIN: Albumin: 3.5 g/dL (ref 3.5–5.0)

## 2017-04-02 NOTE — Unmapped (Signed)
Reviewed labs w/ MD True. Tac improved, but not at goal. Instructed pt to have labs drawn again in a week and she informed me she's getting them drawn Friday. I suggested she aim to get as true a 12hour trough as possible and she agreed she will try.

## 2017-04-03 DIAGNOSIS — Z940 Kidney transplant status: Principal | ICD-10-CM

## 2017-04-03 LAB — LDL CHOLESTEROL, DIRECT: Direct LDL: 92 mg/dL (ref 0–99)

## 2017-04-03 LAB — TACROLIMUS, TROUGH: Lab: 6.2

## 2017-04-03 NOTE — Unmapped (Signed)
Attempted second and final call regarding medication reconciliation post discharge. Unable to leave a message due to no voicemail setup.          Documented by: Lacey Jensen, RN  [facility]

## 2017-04-03 NOTE — Unmapped (Addendum)
3/13 Update: Patient received last medication shipment from [place] in Dec 2018. We have tried to reach patient multiple times with no return call, txp coordinator was notified on 2/6. Most recent rxs have been sent to outside pharmacy. Dis-enrolling patient from specialty calls at this time. Patient is still free to call in refills and we will re-enroll if needed.    [facility] Refill Coordination Note  Medication: myfortic, prograf    Unable to reach patient to schedule shipment for medication being filled at [facility]. mobile - no voicemail set up, home - left message.  As this is the 3rd unsuccessful attempt to reach the patient, no additional phone call attempts will be made at this time.      Phone numbers attempted: [phone], [phone]  Last scheduled delivery: 02/21/17    Please call the [facility] at [phone] (option [phone]) should you have any further questions.      Thanks,  [facility] Specialty Team

## 2017-04-04 ENCOUNTER — Encounter: Admit: 2017-04-04 | Discharge: 2017-04-05 | Payer: MEDICAID

## 2017-04-04 DIAGNOSIS — N179 Acute kidney failure, unspecified: Principal | ICD-10-CM

## 2017-04-04 LAB — CBC
HEMATOCRIT: 28.1 % — ABNORMAL LOW (ref 36.0–46.0)
HEMATOCRIT: 27.3 % — ABNORMAL LOW (ref 36.0–46.0)
HEMOGLOBIN: 8.5 g/dL — ABNORMAL LOW (ref 12.0–16.0)
HEMOGLOBIN: 8.2 g/dL — ABNORMAL LOW (ref 12.0–16.0)
MEAN CORPUSCULAR HEMOGLOBIN CONC: 30 g/dL — ABNORMAL LOW (ref 31.0–37.0)
MEAN CORPUSCULAR HEMOGLOBIN: 30.3 pg (ref 26.0–34.0)
MEAN CORPUSCULAR HEMOGLOBIN: 30.1 pg (ref 26.0–34.0)
MEAN CORPUSCULAR VOLUME: 100.8 fL — ABNORMAL HIGH (ref 80.0–100.0)
MEAN PLATELET VOLUME: 9 fL (ref 7.0–10.0)
MEAN PLATELET VOLUME: 8.2 fL (ref 7.0–10.0)
PLATELET COUNT: 310 10*9/L (ref 150–440)
RED BLOOD CELL COUNT: 2.79 10*12/L — ABNORMAL LOW (ref 4.00–5.20)
RED BLOOD CELL COUNT: 2.71 10*12/L — ABNORMAL LOW (ref 4.00–5.20)
RED CELL DISTRIBUTION WIDTH: 19 % — ABNORMAL HIGH (ref 12.0–15.0)
RED CELL DISTRIBUTION WIDTH: 18.8 % — ABNORMAL HIGH (ref 12.0–15.0)
WBC ADJUSTED: 6.4 10*9/L (ref 4.5–11.0)
WBC ADJUSTED: 5.2 10*9/L (ref 4.5–11.0)

## 2017-04-04 LAB — APTT: Coagulation surface induced:Time:Pt:PPP:Qn:Coag: 33.2

## 2017-04-04 LAB — MAGNESIUM: Lab: 1.9

## 2017-04-04 LAB — CBC W/ DIFFERENTIAL
BASOPHILS ABSOLUTE COUNT: 0 10*9/L
BASOPHILS ABSOLUTE COUNT: 0 10*9/L
BASOPHILS RELATIVE PERCENT: 1 %
BASOPHILS RELATIVE PERCENT: 1 %
EOSINOPHILS ABSOLUTE COUNT: 0.6 10*9/L
EOSINOPHILS ABSOLUTE COUNT: 0.3 10*9/L
EOSINOPHILS RELATIVE PERCENT: 16 %
EOSINOPHILS RELATIVE PERCENT: 5 %
HEMATOCRIT: 25.9 % — ABNORMAL LOW
HEMATOCRIT: 25.4 % — ABNORMAL LOW
HEMOGLOBIN: 8.2 g/dL — ABNORMAL LOW
HEMOGLOBIN: 8.1 g/dL — ABNORMAL LOW
LYMPHOCYTES ABSOLUTE COUNT: 0.1 10*9/L — ABNORMAL LOW
LYMPHOCYTES ABSOLUTE COUNT: 0.1 10*9/L — ABNORMAL LOW
LYMPHOCYTES RELATIVE PERCENT: 2 %
MEAN CORPUSCULAR HEMOGLOBIN CONC: 31.7 g/dL — ABNORMAL LOW
MEAN CORPUSCULAR HEMOGLOBIN CONC: 31.6 g/dL — ABNORMAL LOW
MEAN CORPUSCULAR HEMOGLOBIN: 30.6 pg
MEAN CORPUSCULAR HEMOGLOBIN: 30.5 pg
MEAN CORPUSCULAR VOLUME: 96.8 fL
MEAN CORPUSCULAR VOLUME: 96.3 fL
MONOCYTES ABSOLUTE COUNT: 0.2 10*9/L
MONOCYTES ABSOLUTE COUNT: 0.4 10*9/L
MONOCYTES RELATIVE PERCENT: 6 %
MONOCYTES RELATIVE PERCENT: 8 %
NEUTROPHILS ABSOLUTE COUNT: 2.7 10*9/L
NEUTROPHILS ABSOLUTE COUNT: 4.5 10*9/L
NEUTROPHILS RELATIVE PERCENT: 75 %
NEUTROPHILS RELATIVE PERCENT: 84 %
PLATELET COUNT: 200 10*9/L
PLATELET COUNT: 238 10*9/L
RED BLOOD CELL COUNT: 2.67 10*12/L — ABNORMAL LOW
RED CELL DISTRIBUTION WIDTH: 18.1 % — ABNORMAL HIGH
RED CELL DISTRIBUTION WIDTH: 18.5 % — ABNORMAL HIGH
WBC ADJUSTED: 5.3 10*9/L
WHITE BLOOD CELL COUNT: 3.6 10*9/L

## 2017-04-04 LAB — MACROCYTES: Lab: 0

## 2017-04-04 LAB — LIPID PANEL
CHOLESTEROL: 177 mg/dL
LDL CHOLESTEROL CALCULATED: 92 mg/dL

## 2017-04-04 LAB — COMPREHENSIVE METABOLIC PANEL
ANION GAP: 7 mmol/L — ABNORMAL LOW (ref 9–15)
BLOOD UREA NITROGEN: 40 mg/dL — ABNORMAL HIGH (ref 7–21)
BUN / CREAT RATIO: 20
CALCIUM: 8.7 mg/dL (ref 8.5–10.2)
CHLORIDE: 119 mmol/L — ABNORMAL HIGH (ref 98–107)
CO2: 14 mmol/L — ABNORMAL LOW (ref 22.0–30.0)
CREATININE: 1.97 mg/dL — ABNORMAL HIGH (ref 0.60–1.00)
EGFR MDRD AF AMER: 33 mL/min/{1.73_m2} — ABNORMAL LOW (ref >=60)
EGFR MDRD NON AF AMER: 27 mL/min/{1.73_m2} — ABNORMAL LOW (ref >=60)
GLUCOSE RANDOM: 84 mg/dL (ref 65–179)
SODIUM: 140 mmol/L (ref 135–145)

## 2017-04-04 LAB — BASIC METABOLIC PANEL
BLOOD UREA NITROGEN: 39 mg/dL — ABNORMAL HIGH
CHLORIDE: 118 mmol/L — ABNORMAL HIGH
CO2: 15 mmol/L — ABNORMAL LOW
CO2: 15 mmol/L — ABNORMAL LOW
CREATININE: 1.82 mg/dL — ABNORMAL HIGH
EGFR MDRD AF AMER: 38 mL/min/{1.73_m2} — ABNORMAL LOW
EGFR MDRD AF AMER: 67 mL/min/{1.73_m2} — ABNORMAL LOW
GLUCOSE RANDOM: 93 mg/dL
GLUCOSE RANDOM: 92 mg/dL
POTASSIUM: 4.9 mmol/L
SODIUM: 140 mmol/L
SODIUM: 141 mmol/L

## 2017-04-04 LAB — BLOOD UREA NITROGEN: Lab: 39 — ABNORMAL HIGH

## 2017-04-04 LAB — PHOSPHORUS
Lab: 4.2
Lab: 4.5

## 2017-04-04 LAB — GAMMA GLUTAMYL TRANSFERASE: Lab: 14

## 2017-04-04 LAB — PLATELET COUNT: Lab: 319

## 2017-04-04 LAB — HEPATIC FUNCTION PANEL
ALKALINE PHOSPHATASE: 90 U/L
AST (SGOT): 15 U/L
BILIRUBIN TOTAL: 0.5 mg/dL

## 2017-04-04 LAB — ESTIMATED AVERAGE GLUCOSE: Lab: 0

## 2017-04-04 LAB — MEAN PLATELET VOLUME: Lab: 8.2

## 2017-04-04 LAB — AMYLASE: Lab: 90

## 2017-04-04 LAB — PROTIME-INR: INR: 0.91

## 2017-04-04 LAB — CHOLESTEROL: Lab: 177

## 2017-04-04 LAB — PROTEIN TOTAL: Lab: 0

## 2017-04-04 LAB — GLUCOSE RANDOM: Glucose:MCnc:Pt:Ser/Plas:Qn:: 84

## 2017-04-04 LAB — LIPASE: Lab: 68 — ABNORMAL HIGH

## 2017-04-04 LAB — PROTIME: Lab: 10.4

## 2017-04-04 LAB — RED BLOOD CELL COUNT: Lab: 2.64 — ABNORMAL LOW

## 2017-04-04 LAB — ALBUMIN: Lab: 3.5

## 2017-04-04 LAB — POTASSIUM: Lab: 4.8

## 2017-04-04 NOTE — Unmapped (Signed)
4th pass. 4th core biopsy taken

## 2017-04-04 NOTE — Unmapped (Signed)
Assessment/Plan:    [name] is a 47 y.o. female who will undergo Ultrasound guided transplant kidney biopsy    1. Indications and risks/benefits of procedure reviewed with patient.    2. Consent signed and present on patient's charge.   3. No cardiopulmonary or other medical contraindications present therefore will proceed with biopsy.       CC: No chief complaint on file.      HPI: [name] is a 47 y.o. female who will undergo  Ultrasound guided transplant kidney biopsy with moderate sedation.    Allergies:   Allergies   Allergen Reactions   ??? Oxycodone-Acetaminophen Other (See Comments)      crazy dreams       Medications:   Current Outpatient Prescriptions   Medication Sig Dispense Refill   ??? albuterol (PROVENTIL HFA;VENTOLIN HFA) 90 mcg/actuation inhaler Inhale 2 puffs every six (6) hours as needed for wheezing or shortness of breath. 1 Inhaler 0   ??? amitriptyline (ELAVIL) 25 MG tablet Take 1 tablet (25 mg total) by mouth three (3) times a day (at 6am, noon and 6pm). (Patient taking differently: Take 25 mg by mouth Two (2) times a day. ) 90 tablet 3   ??? aspirin (ADULT LOW DOSE ASPIRIN) 81 MG tablet Take 81 mg by mouth daily.      ??? atorvastatin (LIPITOR) 40 MG tablet TAKE ONE TABLET BY MOUTH DAILY (Patient taking differently: TAKE ONE TABLET BY MOUTH DAILY AT NIGHT) 30 tablet 11   ??? calcium-vitamin D (CALCIUM-VITAMIN D) 500 mg(1,250mg ) -200 unit per tablet Take 500 mg by mouth daily.      ??? cyclobenzaprine (FLEXERIL) 5 MG tablet Take 1-2 tablets (5-10 mg total) by mouth Three (3) times a day as needed for muscle spasms. 60 tablet 0   ??? DUREZOL 0.05 % Drop Administer 1 drop to both eyes daily as needed.      ??? multivitamin (PHARMACIST FAVORITE MULTI-VIT) per tablet Take 1 tablet by mouth daily. Frequency:QD   Dosage:1   TABLET  Instructions:  Note:Dose: 1 TABLET     ??? mycophenolate (MYFORTIC) 180 MG EC tablet Take 2 tablets (360 mg total) by mouth Two (2) times a day. Z94.0, DAW1, brand medically necessary 180 tablet 5   ??? omeprazole (PRILOSEC) 40 MG capsule TAKE ONE CAPSULE BY MOUTH TWICE A DAY 180 capsule 3   ??? polyethylene glycol (MIRALAX) 17 gram/dose powder Take 17 g by mouth daily. (Patient taking differently: Take 17 g by mouth daily as needed. ) 255 g 3   ??? predniSONE (DELTASONE) 10 MG tablet Take 1 tablet (10 mg total) by mouth daily. 30 tablet 1   ??? PROGRAF 1 mg capsule 5mg  in the am and 4mg  in the pm, DAW1, brand medically necessary, tx date 01/31/09, Z94.0, [facility] Transfer 270 capsule 5   ??? sodium bicarbonate 650 mg tablet Take 2 tablets (1,300 mg total) by mouth Two (2) times a day. 120 tablet 11   ??? sulfamethoxazole-trimethoprim (BACTRIM,SEPTRA) 400-80 mg per tablet Take 1 tablet (80 mg of trimethoprim total) by mouth 3 (three) times a week. 30 tablet 2   ??? valGANciclovir (VALCYTE) 450 mg tablet Take 1 tablet (450 mg total) by mouth daily. 30 tablet 2   ??? VIGAMOX 0.5 % ophthalmic solution Administer 1 drop to both eyes daily as needed.        No current facility-administered medications for this encounter.        PMH:   Past Medical History:  Diagnosis Date   ??? Anemia    ??? Chronic kidney disease    ??? CTS (carpal tunnel syndrome)    ??? Diabetes mellitus (CMS-HCC)     history prior to pancreas transplant-a1c 5.2 2016 without DM meds   ??? GERD (gastroesophageal reflux disease)    ??? Hyperlipidemia 01/31/2009   ??? Hypertension    ??? Kidney transplanted    ??? Pancreas transplanted (CMS-HCC)    ??? Pneumonia 2010       ASA Grade: ASA 2 - Patient with mild systemic disease with no functional limitations    ROS:  General: Denies fever or chills.  Cardiovascular: Denies chest pain.   Pulmonary: Denies shortness of breath, snoring, sleep apnea, or respiratory infection.    Allergies:   Allergies   Allergen Reactions   ??? Oxycodone-Acetaminophen Other (See Comments)      crazy dreams           PE:    Vitals:    04/04/17 0843   BP: 140/71   Pulse: 80   Resp: 18   Temp: 36.4 ??C   SpO2: 100%     General:  Well appearing female in NAD.  Airway assessment: Class 2 - Can visualize soft palate and fauces, tip of uvula is obscured  Cardiovascular:  Regular rate and rhythm.  No murmurs, gallops or rubs.   Lungs: respirations nonlabored; clear to auscultation bilaterally

## 2017-04-04 NOTE — Unmapped (Signed)
Procedure complete . No bleeding noted. Bandaid applied to site and pressure binder applied

## 2017-04-04 NOTE — Unmapped (Signed)
KIDNEY BIOPSY PROCEDURE NOTE    INDICATIONS:  Assess for rejection     CONSENT/TIME OUT:    Risks, benefits and alternatives including blood loss requiring transfusion, loss of kidney or kidney function, and death were discussed with patient.  Written informed consent was obtained prior to the procedure and is detailed in the medical record.  Prior to the start of the procedure, a time out was taken and the identity of the patient was confirmed via name, medical record number and date of birth.  The availability of the correct equipment was verified.    PROCEDURE:  Name: Transplant Kidney Biopsy  Description: Ultrasound guided transplant kidney biopsy     Pre-Procedure blood specimens were sent for CBC, PT/aPTT, and type & screen.     The patient was given 1 mg of midazolam and 25 mcg of fentanyl during the procedure, and 20 mL lidocaine 1% were used for local anesthesia. Under ultrasound guidance, a 16cm 16G biopsy needle was inserted into the left kidney for a total of 2 passes with 2 core specimens obtained for analysis.      COMPLICATIONS:  Complications:  Post-procedure ultrasound shows no hematoma.   Complications of the procedure: none     The patient will be closely watched in the recovery area, kept flat for 6 hours while wearing an abdominal binder, and will have a repeat CBC and ultrasound in 4 hours to ensure no hemodynamically significant bleeding post-biopsy.    SPECIMEN(S):  Core #1: 1.2 x 0.1 x 0.1 (LM 0.9, IF 0.3)  Core #2: 0.9 x 0.1 x 0.1 (EM 0.2, LM 0.7)

## 2017-04-04 NOTE — Unmapped (Signed)
The Venous Access Team (VAT) received a request from care RN.   PIV placed.           PIV Workup / Procedure Time:  45 minutes      The primary RN was notified.       Thank you,     Elizabeth Palau RN Venous Access Team

## 2017-04-04 NOTE — Unmapped (Signed)
3rd pass 3rd core  taken

## 2017-04-04 NOTE — Unmapped (Signed)
1st pass 1st core biopsy taken

## 2017-04-04 NOTE — Unmapped (Signed)
2nd pass 2nd core biopsy taken

## 2017-04-05 NOTE — Unmapped (Signed)
Nephrology Initial Biopsy Result Note:  Prelim biopsy results with improvement in recently treated episode of rejection, though still with some evidence of smoldering rejection.    Patient okay to go home, we will call her with final results after pathology conference tomorrow, 2/8.    Verl Blalock  Nephrology and Hypertension, PGY5  Pager: [phone]  April 04, 2017 5:44 PM

## 2017-04-10 ENCOUNTER — Encounter: Admit: 2017-04-10 | Discharge: 2017-04-10 | Payer: MEDICAID | Attending: Nephrology | Primary: Nephrology

## 2017-04-10 ENCOUNTER — Other Ambulatory Visit
Admission: RE | Admit: 2017-04-10 | Discharge: 2017-04-10 | Disposition: A | Discharge status: Home / Self Care | Payer: Medicare Other | Source: Ambulatory Visit | Attending: Nephrology | Admitting: Nephrology

## 2017-04-10 DIAGNOSIS — Z79899 Other long term (current) drug therapy: Secondary | ICD-10-CM | POA: Insufficient documentation

## 2017-04-10 DIAGNOSIS — D899 Disorder involving the immune mechanism, unspecified: Secondary | ICD-10-CM | POA: Diagnosis present

## 2017-04-10 DIAGNOSIS — N390 Urinary tract infection, site not specified: Secondary | ICD-10-CM | POA: Insufficient documentation

## 2017-04-10 DIAGNOSIS — Z789 Other specified health status: Secondary | ICD-10-CM | POA: Insufficient documentation

## 2017-04-10 DIAGNOSIS — N189 Chronic kidney disease, unspecified: Secondary | ICD-10-CM | POA: Diagnosis not present

## 2017-04-10 DIAGNOSIS — Z114 Encounter for screening for human immunodeficiency virus [HIV]: Secondary | ICD-10-CM | POA: Diagnosis not present

## 2017-04-10 DIAGNOSIS — E559 Vitamin D deficiency, unspecified: Secondary | ICD-10-CM | POA: Diagnosis not present

## 2017-04-10 DIAGNOSIS — Z940 Kidney transplant status: Secondary | ICD-10-CM | POA: Diagnosis not present

## 2017-04-10 DIAGNOSIS — Z09 Encounter for follow-up examination after completed treatment for conditions other than malignant neoplasm: Secondary | ICD-10-CM | POA: Diagnosis not present

## 2017-04-10 DIAGNOSIS — Z9483 Pancreas transplant status: Secondary | ICD-10-CM | POA: Insufficient documentation

## 2017-04-10 DIAGNOSIS — E1122 Type 2 diabetes mellitus with diabetic chronic kidney disease: Secondary | ICD-10-CM | POA: Diagnosis not present

## 2017-04-10 DIAGNOSIS — D631 Anemia in chronic kidney disease: Secondary | ICD-10-CM | POA: Insufficient documentation

## 2017-04-10 DIAGNOSIS — E1129 Type 2 diabetes mellitus with other diabetic kidney complication: Secondary | ICD-10-CM | POA: Diagnosis not present

## 2017-04-10 DIAGNOSIS — B259 Cytomegaloviral disease, unspecified: Secondary | ICD-10-CM | POA: Insufficient documentation

## 2017-04-10 LAB — BASIC METABOLIC PANEL
Anion gap: 6 (ref 5–15)
BUN: 26 mg/dL — ABNORMAL HIGH (ref 6–20)
CALCIUM: 8.9 mg/dL (ref 8.9–10.3)
CO2: 15 mmol/L — ABNORMAL LOW (ref 22–32)
Chloride: 118 mmol/L — ABNORMAL HIGH (ref 101–111)
Creatinine, Ser: 1.5 mg/dL — ABNORMAL HIGH (ref 0.44–1.00)
GFR, EST AFRICAN AMERICAN: 47 mL/min — AB (ref >60)
GFR, EST NON AFRICAN AMERICAN: 40 mL/min — AB (ref >60)
Glucose, Bld: 86 mg/dL (ref 65–99)
POTASSIUM: 4.9 mmol/L (ref 3.5–5.1)
Sodium: 139 mmol/L (ref 135–145)

## 2017-04-10 LAB — CBC WITH DIFFERENTIAL/PLATELET
BASOS ABS: 0 10*3/uL (ref 0–0.1)
BASOS PCT: 1 %
EOS PCT: 3 %
Eosinophils Absolute: 0.2 10*3/uL (ref 0–0.7)
HCT: 27.3 % — ABNORMAL LOW (ref 35.0–47.0)
Hemoglobin: 8.6 g/dL — ABNORMAL LOW (ref 12.0–16.0)
Lymphocytes Relative: 3 %
Lymphs Abs: 0.2 10*3/uL — ABNORMAL LOW (ref 1.0–3.6)
MCH: 30.9 pg (ref 26.0–34.0)
MCHC: 31.3 g/dL — ABNORMAL LOW (ref 32.0–36.0)
MCV: 98.6 fL (ref 80.0–100.0)
MONO ABS: 0.4 10*3/uL (ref 0.2–0.9)
Monocytes Relative: 7 %
NEUTROS ABS: 5 10*3/uL (ref 1.4–6.5)
Neutrophils Relative %: 86 %
PLATELETS: 245 10*3/uL (ref 150–440)
RBC: 2.77 MIL/uL — AB (ref 3.80–5.20)
RDW: 19.4 % — AB (ref 11.5–14.5)
WBC: 5.8 10*3/uL (ref 3.6–11.0)

## 2017-04-10 LAB — MAGNESIUM: MAGNESIUM: 2.1 mg/dL (ref 1.7–2.4)

## 2017-04-10 LAB — LIPASE, BLOOD: LIPASE: 56 U/L — AB (ref 11–51)

## 2017-04-10 LAB — PHOSPHORUS: PHOSPHORUS: 4.1 mg/dL (ref 2.5–4.6)

## 2017-04-10 LAB — AMYLASE: Amylase: 78 U/L (ref 28–100)

## 2017-04-11 LAB — LIPASE: Lab: 56 — ABNORMAL HIGH

## 2017-04-11 LAB — BASIC METABOLIC PANEL
BLOOD UREA NITROGEN: 26 mg/dL — ABNORMAL HIGH
CALCIUM: 8.9 mg/dL
EGFR MDRD AF AMER: 47 mL/min/{1.73_m2} — ABNORMAL LOW
GLUCOSE RANDOM: 86 mg/dL
POTASSIUM: 4.9 mmol/L
SODIUM: 139 mmol/L

## 2017-04-11 LAB — CBC W/ DIFFERENTIAL
BASOPHILS ABSOLUTE COUNT: 0 10*9/L
BASOPHILS RELATIVE PERCENT: 1 %
EOSINOPHILS ABSOLUTE COUNT: 0.2 10*9/L
EOSINOPHILS RELATIVE PERCENT: 3 %
HEMATOCRIT: 27.3 % — ABNORMAL LOW
HEMOGLOBIN: 8.6 g/dL — ABNORMAL LOW
LYMPHOCYTES RELATIVE PERCENT: 3 %
MEAN CORPUSCULAR HEMOGLOBIN CONC: 31.3 g/dL — ABNORMAL LOW
MEAN CORPUSCULAR HEMOGLOBIN: 30.9 pg
MEAN CORPUSCULAR VOLUME: 98.6 fL
MONOCYTES ABSOLUTE COUNT: 0.4 10*9/L
MONOCYTES RELATIVE PERCENT: 7 %
NEUTROPHILS RELATIVE PERCENT: 86 %
PLATELET COUNT: 245 10*9/L
RED BLOOD CELL COUNT: 2.77 10*12/L — ABNORMAL LOW
RED CELL DISTRIBUTION WIDTH: 19.4 % — ABNORMAL HIGH
WHITE BLOOD CELL COUNT: 5.8 10*9/L

## 2017-04-11 LAB — AMYLASE: Lab: 78

## 2017-04-11 LAB — PHOSPHORUS: Lab: 4.1

## 2017-04-11 LAB — MAGNESIUM: Lab: 2.1

## 2017-04-11 LAB — HEMOGLOBIN: Lab: 8.6 — ABNORMAL LOW

## 2017-04-11 LAB — CREATININE: Lab: 1.5 — ABNORMAL HIGH

## 2017-04-11 MED ORDER — ASPIRIN 81 MG TABLET,DELAYED RELEASE
ORAL_TABLET | Freq: Every day | ORAL | 0 refills | 0 days | Status: CP
Start: 2017-04-11 — End: ?

## 2017-04-16 ENCOUNTER — Encounter: Admit: 2017-04-16 | Discharge: 2017-04-17 | Payer: MEDICAID

## 2017-04-16 ENCOUNTER — Other Ambulatory Visit
Admission: RE | Admit: 2017-04-16 | Discharge: 2017-04-16 | Disposition: A | Discharge status: Home / Self Care | Payer: Medicare Other | Source: Ambulatory Visit | Attending: Nephrology | Admitting: Nephrology

## 2017-04-16 DIAGNOSIS — Z9483 Pancreas transplant status: Secondary | ICD-10-CM

## 2017-04-16 DIAGNOSIS — Z940 Kidney transplant status: Principal | ICD-10-CM

## 2017-04-16 DIAGNOSIS — Z79899 Other long term (current) drug therapy: Secondary | ICD-10-CM

## 2017-04-16 DIAGNOSIS — D899 Disorder involving the immune mechanism, unspecified: Secondary | ICD-10-CM | POA: Diagnosis present

## 2017-04-16 DIAGNOSIS — Z114 Encounter for screening for human immunodeficiency virus [HIV]: Secondary | ICD-10-CM | POA: Insufficient documentation

## 2017-04-16 DIAGNOSIS — Z789 Other specified health status: Secondary | ICD-10-CM | POA: Insufficient documentation

## 2017-04-16 DIAGNOSIS — Z09 Encounter for follow-up examination after completed treatment for conditions other than malignant neoplasm: Secondary | ICD-10-CM | POA: Diagnosis not present

## 2017-04-16 DIAGNOSIS — E1129 Type 2 diabetes mellitus with other diabetic kidney complication: Secondary | ICD-10-CM | POA: Diagnosis not present

## 2017-04-16 DIAGNOSIS — D631 Anemia in chronic kidney disease: Secondary | ICD-10-CM | POA: Diagnosis not present

## 2017-04-16 DIAGNOSIS — E559 Vitamin D deficiency, unspecified: Secondary | ICD-10-CM | POA: Insufficient documentation

## 2017-04-16 DIAGNOSIS — N390 Urinary tract infection, site not specified: Secondary | ICD-10-CM | POA: Insufficient documentation

## 2017-04-16 DIAGNOSIS — B259 Cytomegaloviral disease, unspecified: Secondary | ICD-10-CM | POA: Insufficient documentation

## 2017-04-16 DIAGNOSIS — N189 Chronic kidney disease, unspecified: Secondary | ICD-10-CM | POA: Diagnosis not present

## 2017-04-16 LAB — BASIC METABOLIC PANEL
ANION GAP: 8 (ref 5–15)
BUN: 36 mg/dL — ABNORMAL HIGH (ref 6–20)
CALCIUM: 8.6 mg/dL — AB (ref 8.9–10.3)
CO2: 15 mmol/L — ABNORMAL LOW (ref 22–32)
CREATININE: 1.64 mg/dL — AB (ref 0.44–1.00)
Chloride: 118 mmol/L — ABNORMAL HIGH (ref 101–111)
GFR, EST AFRICAN AMERICAN: 42 mL/min — AB (ref >60)
GFR, EST NON AFRICAN AMERICAN: 36 mL/min — AB (ref >60)
GLUCOSE: 90 mg/dL (ref 65–99)
Potassium: 4.7 mmol/L (ref 3.5–5.1)
Sodium: 141 mmol/L (ref 135–145)

## 2017-04-16 LAB — CBC WITH DIFFERENTIAL/PLATELET
BASOS ABS: 0 10*3/uL (ref 0–0.1)
BASOS PCT: 0 %
EOS ABS: 0.1 10*3/uL (ref 0–0.7)
Eosinophils Relative: 3 %
HEMATOCRIT: 27.1 % — AB (ref 35.0–47.0)
Hemoglobin: 8.7 g/dL — ABNORMAL LOW (ref 12.0–16.0)
Lymphocytes Relative: 3 %
Lymphs Abs: 0.2 10*3/uL — ABNORMAL LOW (ref 1.0–3.6)
MCH: 31.4 pg (ref 26.0–34.0)
MCHC: 32.1 g/dL (ref 32.0–36.0)
MCV: 97.7 fL (ref 80.0–100.0)
MONO ABS: 0.5 10*3/uL (ref 0.2–0.9)
Monocytes Relative: 9 %
NEUTROS ABS: 5 10*3/uL (ref 1.4–6.5)
Neutrophils Relative %: 85 %
PLATELETS: 194 10*3/uL (ref 150–440)
RBC: 2.78 MIL/uL — ABNORMAL LOW (ref 3.80–5.20)
RDW: 19.5 % — AB (ref 11.5–14.5)
WBC: 5.8 10*3/uL (ref 3.6–11.0)

## 2017-04-16 LAB — LIPASE, BLOOD: LIPASE: 64 U/L — AB (ref 11–51)

## 2017-04-16 LAB — MAGNESIUM: Magnesium: 2.1 mg/dL (ref 1.7–2.4)

## 2017-04-16 LAB — PHOSPHORUS: PHOSPHORUS: 4.2 mg/dL (ref 2.5–4.6)

## 2017-04-16 LAB — AMYLASE: AMYLASE: 87 U/L (ref 28–100)

## 2017-04-17 LAB — TACROLIMUS, TROUGH: Lab: 5.5

## 2017-04-18 LAB — TACROLIMUS, TROUGH: Lab: 6.2

## 2017-04-19 ENCOUNTER — Encounter: Admit: 2017-04-19 | Discharge: 2017-04-19 | Payer: MEDICAID | Attending: Nephrology | Primary: Nephrology

## 2017-04-19 ENCOUNTER — Other Ambulatory Visit
Admission: RE | Admit: 2017-04-19 | Discharge: 2017-04-19 | Disposition: A | Discharge status: Home / Self Care | Payer: Medicare Other | Source: Ambulatory Visit | Attending: Nephrology | Admitting: Nephrology

## 2017-04-19 DIAGNOSIS — Z9483 Pancreas transplant status: Secondary | ICD-10-CM | POA: Diagnosis not present

## 2017-04-19 DIAGNOSIS — Z114 Encounter for screening for human immunodeficiency virus [HIV]: Secondary | ICD-10-CM | POA: Insufficient documentation

## 2017-04-19 DIAGNOSIS — Z79899 Other long term (current) drug therapy: Secondary | ICD-10-CM | POA: Diagnosis not present

## 2017-04-19 DIAGNOSIS — D631 Anemia in chronic kidney disease: Secondary | ICD-10-CM | POA: Diagnosis not present

## 2017-04-19 DIAGNOSIS — N189 Chronic kidney disease, unspecified: Secondary | ICD-10-CM | POA: Diagnosis not present

## 2017-04-19 DIAGNOSIS — N390 Urinary tract infection, site not specified: Secondary | ICD-10-CM | POA: Insufficient documentation

## 2017-04-19 DIAGNOSIS — E1129 Type 2 diabetes mellitus with other diabetic kidney complication: Secondary | ICD-10-CM | POA: Insufficient documentation

## 2017-04-19 DIAGNOSIS — D899 Disorder involving the immune mechanism, unspecified: Secondary | ICD-10-CM | POA: Diagnosis present

## 2017-04-19 DIAGNOSIS — Z789 Other specified health status: Secondary | ICD-10-CM | POA: Insufficient documentation

## 2017-04-19 DIAGNOSIS — B259 Cytomegaloviral disease, unspecified: Secondary | ICD-10-CM | POA: Diagnosis not present

## 2017-04-19 DIAGNOSIS — Z940 Kidney transplant status: Secondary | ICD-10-CM | POA: Diagnosis not present

## 2017-04-19 DIAGNOSIS — E559 Vitamin D deficiency, unspecified: Secondary | ICD-10-CM | POA: Diagnosis not present

## 2017-04-19 LAB — AMYLASE: AMYLASE: 72 U/L (ref 28–100)

## 2017-04-19 LAB — CBC WITH DIFFERENTIAL/PLATELET
BASOS ABS: 0 10*3/uL (ref 0–0.1)
BASOS PCT: 0 %
EOS ABS: 0.1 10*3/uL (ref 0–0.7)
Eosinophils Relative: 2 %
HCT: 29.3 % — ABNORMAL LOW (ref 35.0–47.0)
HEMOGLOBIN: 9 g/dL — AB (ref 12.0–16.0)
Lymphocytes Relative: 3 %
Lymphs Abs: 0.2 10*3/uL — ABNORMAL LOW (ref 1.0–3.6)
MCH: 30.8 pg (ref 26.0–34.0)
MCHC: 30.6 g/dL — AB (ref 32.0–36.0)
MCV: 100.5 fL — ABNORMAL HIGH (ref 80.0–100.0)
MONOS PCT: 7 %
Monocytes Absolute: 0.5 10*3/uL (ref 0.2–0.9)
NEUTROS PCT: 88 %
Neutro Abs: 6.1 10*3/uL (ref 1.4–6.5)
Platelets: 198 10*3/uL (ref 150–440)
RBC: 2.92 MIL/uL — ABNORMAL LOW (ref 3.80–5.20)
RDW: 20 % — AB (ref 11.5–14.5)
WBC: 6.8 10*3/uL (ref 3.6–11.0)

## 2017-04-19 LAB — BASIC METABOLIC PANEL
ANION GAP: 6 (ref 5–15)
BUN: 41 mg/dL — ABNORMAL HIGH (ref 6–20)
CO2: 13 mmol/L — ABNORMAL LOW (ref 22–32)
Calcium: 8.8 mg/dL — ABNORMAL LOW (ref 8.9–10.3)
Chloride: 122 mmol/L — ABNORMAL HIGH (ref 101–111)
Creatinine, Ser: 1.7 mg/dL — ABNORMAL HIGH (ref 0.44–1.00)
GFR, EST AFRICAN AMERICAN: 40 mL/min — AB (ref >60)
GFR, EST NON AFRICAN AMERICAN: 35 mL/min — AB (ref >60)
Glucose, Bld: 96 mg/dL (ref 65–99)
POTASSIUM: 4.8 mmol/L (ref 3.5–5.1)
SODIUM: 141 mmol/L (ref 135–145)

## 2017-04-19 LAB — LIPASE, BLOOD: Lipase: 55 U/L — ABNORMAL HIGH (ref 11–51)

## 2017-04-19 LAB — PHOSPHORUS: PHOSPHORUS: 4.5 mg/dL (ref 2.5–4.6)

## 2017-04-19 LAB — MAGNESIUM: MAGNESIUM: 2.2 mg/dL (ref 1.7–2.4)

## 2017-04-23 DIAGNOSIS — Z79899 Other long term (current) drug therapy: Secondary | ICD-10-CM

## 2017-04-23 DIAGNOSIS — D899 Disorder involving the immune mechanism, unspecified: Secondary | ICD-10-CM

## 2017-04-23 DIAGNOSIS — Z9483 Pancreas transplant status: Secondary | ICD-10-CM

## 2017-04-23 DIAGNOSIS — Z940 Kidney transplant status: Principal | ICD-10-CM

## 2017-04-23 LAB — CBC W/ AUTO DIFF
BASOPHILS ABSOLUTE COUNT: 0 10*9/L (ref 0.0–0.1)
BASOPHILS RELATIVE PERCENT: 0.5 %
EOSINOPHILS RELATIVE PERCENT: 2.1 %
HEMATOCRIT: 28.3 % — ABNORMAL LOW (ref 36.0–46.0)
HEMOGLOBIN: 8.5 g/dL — ABNORMAL LOW (ref 12.0–16.0)
LARGE UNSTAINED CELLS: 1 % (ref 0–4)
LYMPHOCYTES ABSOLUTE COUNT: 0.3 10*9/L — ABNORMAL LOW (ref 1.5–5.0)
LYMPHOCYTES RELATIVE PERCENT: 4.2 %
MEAN CORPUSCULAR HEMOGLOBIN CONC: 30 g/dL — ABNORMAL LOW (ref 31.0–37.0)
MEAN CORPUSCULAR HEMOGLOBIN: 31.3 pg (ref 26.0–34.0)
MEAN CORPUSCULAR VOLUME: 104.4 fL — ABNORMAL HIGH (ref 80.0–100.0)
MEAN PLATELET VOLUME: 8.8 fL (ref 7.0–10.0)
MONOCYTES ABSOLUTE COUNT: 0.3 10*9/L (ref 0.2–0.8)
MONOCYTES RELATIVE PERCENT: 4.2 %
NEUTROPHILS ABSOLUTE COUNT: 5.6 10*9/L (ref 2.0–7.5)
NEUTROPHILS RELATIVE PERCENT: 88.2 %
RED BLOOD CELL COUNT: 2.71 10*12/L — ABNORMAL LOW (ref 4.00–5.20)
RED CELL DISTRIBUTION WIDTH: 18.5 % — ABNORMAL HIGH (ref 12.0–15.0)
WBC ADJUSTED: 6.3 10*9/L (ref 4.5–11.0)

## 2017-04-23 LAB — CBC W/ DIFFERENTIAL
BASOPHILS ABSOLUTE COUNT: 0 10*9/L
BASOPHILS RELATIVE PERCENT: 0 %
BASOPHILS RELATIVE PERCENT: 0 %
EOSINOPHILS ABSOLUTE COUNT: 0.1 10*9/L
EOSINOPHILS ABSOLUTE COUNT: 0.1 10*9/L
EOSINOPHILS RELATIVE PERCENT: 3 %
EOSINOPHILS RELATIVE PERCENT: 2 %
HEMATOCRIT: 27.1 % — ABNORMAL LOW
HEMATOCRIT: 29.3 % — ABNORMAL LOW
HEMOGLOBIN: 8.7 g/dL — ABNORMAL LOW
HEMOGLOBIN: 9 g/dL — ABNORMAL LOW
LYMPHOCYTES ABSOLUTE COUNT: 0.2 10*9/L — ABNORMAL LOW
LYMPHOCYTES RELATIVE PERCENT: 3 %
LYMPHOCYTES RELATIVE PERCENT: 3 %
MEAN CORPUSCULAR HEMOGLOBIN CONC: 32.1 g/dL
MEAN CORPUSCULAR HEMOGLOBIN CONC: 30.6 g/dL — ABNORMAL LOW
MEAN CORPUSCULAR HEMOGLOBIN: 31.4 pg
MEAN CORPUSCULAR HEMOGLOBIN: 30.8 pg
MEAN CORPUSCULAR VOLUME: 97.7 fL
MEAN CORPUSCULAR VOLUME: 100.5 fL — ABNORMAL HIGH
MONOCYTES ABSOLUTE COUNT: 0.5 10*9/L
MONOCYTES ABSOLUTE COUNT: 0.5 10*9/L
MONOCYTES RELATIVE PERCENT: 9 %
MONOCYTES RELATIVE PERCENT: 7 %
NEUTROPHILS ABSOLUTE COUNT: 5 10*9/L
NEUTROPHILS ABSOLUTE COUNT: 6.1 10*9/L
NEUTROPHILS RELATIVE PERCENT: 85 %
NEUTROPHILS RELATIVE PERCENT: 88 %
PLATELET COUNT: 194 10*9/L
PLATELET COUNT: 198 10*9/L
RED BLOOD CELL COUNT: 2.78 10*12/L — ABNORMAL LOW
RED CELL DISTRIBUTION WIDTH: 19.5 % — ABNORMAL HIGH
RED CELL DISTRIBUTION WIDTH: 20 % — ABNORMAL HIGH
WBC ADJUSTED: 5.8 10*9/L
WHITE BLOOD CELL COUNT: 6.8 10*9/L

## 2017-04-23 LAB — URINALYSIS
BLOOD UA: NEGATIVE
KETONES UA: NEGATIVE
LEUKOCYTE ESTERASE UA: NEGATIVE
NITRITE UA: NEGATIVE
PH UA: 6 (ref 5.0–9.0)
RBC UA: <1 /HPF (ref <=4)
SPECIFIC GRAVITY UA: 1.017 (ref 1.003–1.030)
SQUAMOUS EPITHELIAL: 2 /HPF (ref 0–5)
UROBILINOGEN UA: 0.2
WBC UA: 1 /HPF (ref 0–5)

## 2017-04-23 LAB — BASIC METABOLIC PANEL
BLOOD UREA NITROGEN: 41 mg/dL — ABNORMAL HIGH
CALCIUM: 8.6 mg/dL — ABNORMAL LOW
CALCIUM: 8.8 mg/dL — ABNORMAL LOW
CHLORIDE: 118 mmol/L — ABNORMAL HIGH
CHLORIDE: 122 mmol/L — ABNORMAL HIGH
CO2: 15 mmol/L — ABNORMAL LOW
CO2: 13 mmol/L — ABNORMAL LOW
CREATININE: 1.64 mg/dL — ABNORMAL HIGH
CREATININE: 1.7 mg/dL — ABNORMAL HIGH
EGFR MDRD AF AMER: 42 mL/min/{1.73_m2} — ABNORMAL LOW
EGFR MDRD AF AMER: 40 mL/min/{1.73_m2} — ABNORMAL LOW
GLUCOSE RANDOM: 90 mg/dL
GLUCOSE RANDOM: 96 mg/dL
SODIUM: 141 mmol/L
SODIUM: 141 mmol/L

## 2017-04-23 LAB — TACROLIMUS, TROUGH
Lab: 4.9
Lab: 8.3

## 2017-04-23 LAB — PROTEIN/CREAT RATIO, URINE: Protein/Creatinine:MRto:Pt:Urine:Qn:: 0.274

## 2017-04-23 LAB — COMPREHENSIVE METABOLIC PANEL
ALBUMIN: 3.5 g/dL (ref 3.5–5.0)
ALKALINE PHOSPHATASE: 79 U/L (ref 38–126)
ANION GAP: 6 mmol/L — ABNORMAL LOW (ref 9–15)
AST (SGOT): 12 U/L — ABNORMAL LOW (ref 14–38)
BILIRUBIN TOTAL: 0.4 mg/dL (ref 0.0–1.2)
BLOOD UREA NITROGEN: 35 mg/dL — ABNORMAL HIGH (ref 7–21)
BUN / CREAT RATIO: 20
CALCIUM: 9 mg/dL (ref 8.5–10.2)
CO2: 15 mmol/L — ABNORMAL LOW (ref 22.0–30.0)
CREATININE: 1.76 mg/dL — ABNORMAL HIGH (ref 0.60–1.00)
EGFR MDRD AF AMER: 38 mL/min/{1.73_m2} — ABNORMAL LOW (ref >=60)
EGFR MDRD NON AF AMER: 31 mL/min/{1.73_m2} — ABNORMAL LOW (ref >=60)
GLUCOSE RANDOM: 74 mg/dL (ref 65–99)
POTASSIUM: 4.8 mmol/L (ref 3.5–5.0)
PROTEIN TOTAL: 6 g/dL — ABNORMAL LOW (ref 6.5–8.3)
SODIUM: 141 mmol/L (ref 135–145)

## 2017-04-23 LAB — LIPASE
Lab: 55 — ABNORMAL HIGH
Lab: 64 — ABNORMAL HIGH
Triacylglycerol lipase:CCnc:Pt:Ser/Plas:Qn:: 226

## 2017-04-23 LAB — HYPOCHROMIA: Lab: 0

## 2017-04-23 LAB — MONOCYTES RELATIVE PERCENT: Lab: 9

## 2017-04-23 LAB — CMV VIRAL LD: Lab: NOT DETECTED

## 2017-04-23 LAB — BURR CELLS

## 2017-04-23 LAB — AMYLASE
Chemistry studies:Cmplx:-:^Patient:Set:: 67
Lab: 72
Lab: 87

## 2017-04-23 LAB — BASOPHILS ABSOLUTE COUNT: Lab: 0

## 2017-04-23 LAB — MAGNESIUM
Lab: 2.1
Lab: 2.2
Magnesium:MCnc:Pt:Ser/Plas:Qn:: 2

## 2017-04-23 LAB — PHOSPHORUS
Lab: 4.2
Lab: 4.5
Phosphate:MCnc:Pt:Ser/Plas:Qn:: 4.5

## 2017-04-23 LAB — GLUCOSE RANDOM
Glucose:MCnc:Pt:Ser/Plas:Qn:: 74
Lab: 90

## 2017-04-23 LAB — CMV DNA, QUANTITATIVE, PCR

## 2017-04-23 LAB — POTASSIUM: Lab: 4.8

## 2017-04-23 LAB — HEMOGLOBIN A1C: Hemoglobin A1c/Hemoglobin.total:MFr:Pt:Bld:Qn:: 5.3

## 2017-04-23 LAB — PROTEIN UA

## 2017-04-23 MED ORDER — ATORVASTATIN 40 MG TABLET
ORAL_TABLET | Freq: Every day | ORAL | 11 refills | 0.00000 days | Status: CP
Start: 2017-04-23 — End: 2017-07-11

## 2017-04-23 MED ORDER — FUROSEMIDE 20 MG TABLET
ORAL_TABLET | Freq: Every day | ORAL | 11 refills | 0 days | Status: CP | PRN
Start: 2017-04-23 — End: 2017-07-11

## 2017-04-23 NOTE — Unmapped (Signed)
A urine specimen was collected at the visit.

## 2017-04-25 DIAGNOSIS — S82891S Other fracture of right lower leg, sequela: Principal | ICD-10-CM

## 2017-04-25 LAB — C-PEPTIDE: C peptide:MCnc:Pt:Ser/Plas:Qn:: 3.1

## 2017-04-25 NOTE — Unmapped (Signed)
Orthopedic Trauma Return Note  Attending: Brion Aliment MD    ASSESSMENT::  Rachel [name] is a 47 y.o. 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 She can continue with a ctivity as tolerated in regular shoe. We provided a prescription for a custom insole orthosis to provide lift for her LLD and cushioning for her heel. No evidence of recurrence of infection after cessation of antibiotics. All questions were answered. We can plan on leaving follow up open ended at this point.     For the diagnosis of The encounter diagnosis was Closed fracture of right ankle, sequela. the patient was prescribed a custom foot insole with heel lift and cushion.  The patient is ambulatory but has weakness and/or instability of their right lower extremity which requires stabilization from this semi-rigid/ rigid orthosis to improve their function.    Follow up as neededn   XR at follow up: none    SUBJECTIVE:    Chief Complaint: Follow up TTC fusion nail right    Rachel [name] returns to clinic today for evaluation of the above.  She was last seen in clinic 8 weeks ago. She has been off antibiotics with no issues with wound drainage or erythema. She has been ambulating without the boot. Her pain is well controlled. She does have some heel pain and perceived limb length discrepency after being on her feet all day. She also gets some superficial skin breakdown on her plantar surface. No drainage or concern for infection. All in all, she is happy with the results of her surgical procedure.    ROS: Negative for fevers or chest pain      EXAM:   GENERAL: The patient is a well-appearing in no acute distress.  EXTREMITIES:   Incisions are well-healed.  Her foot is in a plantigrade position, maybe 2-3 degrees of hindfoot calceneus.  No significant swelling.  No calf tenderness. Mild tenderness deep to heel, no prominent hardware, some skin ulceration plantar but no full thickness or erythema  Gait is well coordinated and overall minimally antalgic.  Foot is warm and well perfused.  Well coordinated.   +TA/GS/EHL  SILT throughout foot      Test Results:  Imaging: 3 views of the right ankle were ordered and reviewed in clinic today and demonstrates sequelae of TTC nail with maintained good alignment.  There appears to be interval increased bony formation across the ankle as well as subtalar joints.  No evidence of hardware failure.

## 2017-04-26 DIAGNOSIS — T8611 Kidney transplant rejection: Principal | ICD-10-CM

## 2017-04-26 LAB — CBC W/ AUTO DIFF
BASOPHILS ABSOLUTE COUNT: 0 10*9/L (ref 0.0–0.1)
BASOPHILS RELATIVE PERCENT: 0 %
EOSINOPHILS ABSOLUTE COUNT: 0 10*9/L (ref 0.0–0.4)
EOSINOPHILS RELATIVE PERCENT: 0.3 %
HEMATOCRIT: 30 % — ABNORMAL LOW (ref 36.0–46.0)
LARGE UNSTAINED CELLS: 0 % (ref 0–4)
LYMPHOCYTES ABSOLUTE COUNT: 0.2 10*9/L — ABNORMAL LOW (ref 1.5–5.0)
LYMPHOCYTES RELATIVE PERCENT: 1.5 %
MEAN CORPUSCULAR HEMOGLOBIN CONC: 29.5 g/dL — ABNORMAL LOW (ref 31.0–37.0)
MEAN CORPUSCULAR HEMOGLOBIN: 30.9 pg (ref 26.0–34.0)
MEAN CORPUSCULAR VOLUME: 105 fL — ABNORMAL HIGH (ref 80.0–100.0)
MONOCYTES ABSOLUTE COUNT: 0.1 10*9/L — ABNORMAL LOW (ref 0.2–0.8)
MONOCYTES RELATIVE PERCENT: 1.2 %
NEUTROPHILS ABSOLUTE COUNT: 9.4 10*9/L — ABNORMAL HIGH (ref 2.0–7.5)
NEUTROPHILS RELATIVE PERCENT: 96.8 %
PLATELET COUNT: 222 10*9/L (ref 150–440)
RED BLOOD CELL COUNT: 2.85 10*12/L — ABNORMAL LOW (ref 4.00–5.20)
RED CELL DISTRIBUTION WIDTH: 18.8 % — ABNORMAL HIGH (ref 12.0–15.0)
WBC ADJUSTED: 9.7 10*9/L (ref 4.5–11.0)

## 2017-04-26 LAB — PHOSPHORUS: Phosphate:MCnc:Pt:Ser/Plas:Qn:: 3.9

## 2017-04-26 LAB — EOSINOPHILS ABSOLUTE COUNT: Lab: 0

## 2017-04-26 LAB — INR: Lab: 0.96

## 2017-04-26 NOTE — Unmapped (Signed)
0750-IV 24G Jelco inserted in left forearm. Pt tolerated without difficulty  0805-Solu-Medrol infusing  0842 Infusion complete. IV left in place per order. Pt is to be admitted to hospital later today.  Pt left clinic in stable condition

## 2017-04-27 ENCOUNTER — Ambulatory Visit
Admit: 2017-04-27 | Discharge: 2017-04-28 | Disposition: A | Discharge status: Home / Self Care | Payer: MEDICAID | Attending: Nephrology | Admitting: Nephrology

## 2017-04-27 ENCOUNTER — Ambulatory Visit
Admit: 2017-04-27 | Discharge: 2017-04-28 | Disposition: A | Discharge status: Home / Self Care | Payer: MEDICAID | Admitting: Nephrology

## 2017-04-27 ENCOUNTER — Institutional Professional Consult (permissible substitution)
Admit: 2017-04-27 | Discharge: 2017-04-28 | Disposition: A | Discharge status: Home / Self Care | Payer: MEDICAID | Admitting: Nephrology

## 2017-04-27 LAB — BASIC METABOLIC PANEL
ANION GAP: 8 mmol/L — ABNORMAL LOW (ref 9–15)
BLOOD UREA NITROGEN: 33 mg/dL — ABNORMAL HIGH (ref 7–21)
BUN / CREAT RATIO: 21
CALCIUM: 9.1 mg/dL (ref 8.5–10.2)
CHLORIDE: 121 mmol/L — ABNORMAL HIGH (ref 98–107)
CO2: 12 mmol/L — ABNORMAL LOW (ref 22.0–30.0)
CREATININE: 1.6 mg/dL — ABNORMAL HIGH (ref 0.60–1.00)
EGFR MDRD AF AMER: 42 mL/min/{1.73_m2} — ABNORMAL LOW (ref >=60)
GLUCOSE RANDOM: 161 mg/dL (ref 65–179)
POTASSIUM: 5.1 mmol/L — ABNORMAL HIGH (ref 3.5–5.0)
SODIUM: 141 mmol/L (ref 135–145)

## 2017-04-27 LAB — BLOOD UREA NITROGEN: Urea nitrogen:MCnc:Pt:Ser/Plas:Qn:: 33 — ABNORMAL HIGH

## 2017-04-27 LAB — CBC W/ AUTO DIFF
BASOPHILS ABSOLUTE COUNT: 0 10*9/L (ref 0.0–0.1)
BASOPHILS RELATIVE PERCENT: 0.1 %
EOSINOPHILS ABSOLUTE COUNT: 0 10*9/L (ref 0.0–0.4)
EOSINOPHILS RELATIVE PERCENT: 0.1 %
HEMATOCRIT: 28.1 % — ABNORMAL LOW (ref 36.0–46.0)
HEMOGLOBIN: 8.4 g/dL — ABNORMAL LOW (ref 12.0–16.0)
LARGE UNSTAINED CELLS: 0 % (ref 0–4)
LYMPHOCYTES ABSOLUTE COUNT: 0.2 10*9/L — ABNORMAL LOW (ref 1.5–5.0)
MEAN CORPUSCULAR HEMOGLOBIN CONC: 29.9 g/dL — ABNORMAL LOW (ref 31.0–37.0)
MEAN CORPUSCULAR HEMOGLOBIN: 31.1 pg (ref 26.0–34.0)
MEAN CORPUSCULAR VOLUME: 104.1 fL — ABNORMAL HIGH (ref 80.0–100.0)
MEAN PLATELET VOLUME: 8.5 fL (ref 7.0–10.0)
MONOCYTES ABSOLUTE COUNT: 0.4 10*9/L (ref 0.2–0.8)
MONOCYTES RELATIVE PERCENT: 3.4 %
NEUTROPHILS ABSOLUTE COUNT: 10.6 10*9/L — ABNORMAL HIGH (ref 2.0–7.5)
NEUTROPHILS RELATIVE PERCENT: 94.6 %
PLATELET COUNT: 240 10*9/L (ref 150–440)
RED BLOOD CELL COUNT: 2.7 10*12/L — ABNORMAL LOW (ref 4.00–5.20)
RED CELL DISTRIBUTION WIDTH: 18.8 % — ABNORMAL HIGH (ref 12.0–15.0)

## 2017-04-27 LAB — MAGNESIUM: Magnesium:MCnc:Pt:Ser/Plas:Qn:: 2.1

## 2017-04-27 LAB — TACROLIMUS, TROUGH: Lab: 4.4

## 2017-04-27 LAB — HYPOCHROMIA

## 2017-04-27 NOTE — Unmapped (Signed)
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 female s/p deceased donor renal and pancreas transplant in 2010, history of UTI, history of chronic right foot wound who presented to [facility] for IV steroids in the setting of concern for rejection.    S/p Kidney-Pancreas Transplant, e/o rejection: s/p deceased donor kidney and pancreas transplant on 01/31/2009, secondary to Type 1 Diabetes mellitus.  Amylase/lipase noted to be elevated 02/2017, Pancreatic ultrasound from last admission w/ patent venous and arterial vasculature to pancreatic graft. Most recent amylase wnl but lipase elevated 226, c/f pancreatic rejection. Cr also uptrending from prior baseline (1.3-1.4) but improved from prior acute cellular rejection in January. Renal biopsy during last admission showed  acute tubulointerstitial cellular rejection with mild focal transplant endarteritis, mild arteriosclerosis, mild to moderate interstitial fibrosis and tubular atrophy, s/p thymoglobulin (150mg  q24h x 10 days??(1/7- 1/16)). Repeat biopsy on 04/04/17 demonstrates changes suggestive of smoldering minimal/mild tubulointerstitial cellular rejection.   -- 3-day course solumedrol 500mg  qd (3/1-3/3) for likely ongoing rejection  -- Continue home Myfortic 540mg  BID (recent increase from 360mg )  -- con't valganciclovir 450mg  qd  -- con't bactrim 1 tablet MWF  -- Continue home Tacrolimus 5 mg in the AM and 4 mg qhs  -- holding pred 10mg  while receiving IV steroids    Anemia: Received IV Iron in September 2018 with improvement in Hemoglobin.  Hemoglobin stable and at baseline from 2/26 labs. Admission labs pending.  ??  Hx of Multi-Drug Resistant UTI:  Required IV Ertapenem and hospitalization in April 2018 for UTI.  No symptoms of UTI and Urinalysis from 2/26 without evidence of infection.  ??  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  Wounds have now all healed.  Patient follows with orthopedic surgery as an outpatient on prn basis. Last seen 2/28  No increased swelling beyond baseline, no pain, no erythema.     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 female s/p deceased donor renal and pancreas transplant in 2010, history of UTI, history of chronic right foot wound who presented to [facility] for completion of three-day course of IV steroids with recent renal biopsy suggesting cellular rejection of transplanted kidney.     Pt underwent deceased donor simultaneous kidney and pancreas transplant on 01/31/2009 secondary to type 1 diabetes mellitus.  Prior to that had a living donor renal transplant in 1998, which failed in 2008. Patient was recently admitted (03/01/17-03/13/17) with elevated lipase/amlyase and uptrending Cr and proteinuria, subsequent renal biopsy demonstrating likely cellular rejection. Completed thymoglobulin course (1/7-1/16). Repeat renal biopsy 04/04/17, with results revealing changes suggestive of smoldering minimal/mild tubulointerstitial cellular rejection.    She has not complaints at time  of admission. Does endorse a metallic taste in her mouth, that she feels gets worse with steroids.     No hematuria, no dysuria, no change in urine. No fevers or chills, no N/V/D, abdominal pain or change in bowels.     In terms of her other past medical conditions.  Since January 2016 she had a nonhealing right foot wound.  She had a flap Surgery performed 09/17/2014 and another surgery 09/17/2014, and then again 10/24/2014. She was admitted to [facility] from 6/30 - 09/10/2016 due to charcot changes of foot, no definitive osteomyelitis.  Taken to OR for hardware exchange and antibiotic bead placement.  She was on Vancomycin and Ceftriaxone for a total of 6 weeks, which ended 10/15/2016.  Some ankle instability and had arthrodesis of right ankle on 11/28/2016 without complications.  Her wound is now healed.  She follows with Orthopedics as an outpatient.  She has not had any increased swelling or pain of her right ankle joint.  She also with a history of multi-drug-resistant UTI and was admitted from 4/19 to 06/15/2016.  She was treated with 7 days of ertapenem.  She has had no dysuria, no frequency, no hematuria, no other signs of UTI.    Allergies:  Oxycodone-acetaminophen    Medications:   Prior to Admission medications    Medication Dose, Route, Frequency   albuterol (PROVENTIL HFA;VENTOLIN HFA) 90 mcg/actuation inhaler 2 puffs, Inhalation, Every 6 hours PRN   amitriptyline (ELAVIL) 25 MG tablet 25 mg, Oral, 3 times a day  Patient taking differently: Take 25 mg by mouth Two (2) times a day.    aspirin (ADULT LOW DOSE ASPIRIN) 81 MG tablet 81 mg, Oral, Daily (standard)   atorvastatin (LIPITOR) 40 MG tablet 40 mg, Oral, Daily (standard)   calcium-vitamin D (CALCIUM-VITAMIN D) 500 mg(1,250mg ) -200 unit per tablet 500 mg   cyclobenzaprine (FLEXERIL) 5 MG tablet 5-10 mg, Oral, 3 times a day PRN   DUREZOL 0.05 % Drop 1 drop, Both Eyes, Daily PRN   furosemide (LASIX) 20 MG tablet 20 mg, Oral, Daily PRN   multivitamin (PHARMACIST FAVORITE MULTI-VIT) per tablet 1 tablet, Oral, Daily (standard), Frequency:QD   Dosage:1   TABLET  Instructions:  Note:Dose: 1 TABLET   mycophenolate (MYFORTIC) 180 MG EC tablet 360 mg, Oral, 2 times a day (standard), Z94.0, DAW1, brand medically necessary   omeprazole (PRILOSEC) 40 MG capsule TAKE ONE CAPSULE BY MOUTH TWICE A DAY   predniSONE (DELTASONE) 10 MG tablet 10 mg, Oral, Daily   PROGRAF 1 mg capsule 5mg  in the am and 4mg  in the pm, DAW1, brand medically necessary, tx date 01/31/09, Z94.0, [facility]   sodium bicarbonate 650 mg tablet 1,300 mg, Oral, 2 times a day (standard)   sulfamethoxazole-trimethoprim (BACTRIM,SEPTRA) 400-80 mg per tablet 1 tablet, Oral, 3 times weekly   valGANciclovir (VALCYTE) 450 mg tablet 450 mg, Oral, Daily (standard)   VIGAMOX 0.5 % ophthalmic solution 1 drop, Both Eyes, Daily PRN       Medical History:  Past Medical History:   Diagnosis Date   ??? Anemia    ??? Chronic kidney disease    ??? CTS (carpal tunnel syndrome)    ??? Diabetes mellitus (CMS-HCC)     history prior to pancreas transplant-a1c 5.2 2016 without DM meds   ??? GERD (gastroesophageal reflux disease)    ??? Hyperlipidemia 01/31/2009   ??? Hypertension    ??? Kidney transplanted    ??? Pancreas transplanted (CMS-HCC)    ??? Pneumonia 2010  Surgical History:  Past Surgical History:   Procedure Laterality Date   ??? CARPAL TUNNEL RELEASE     ??? CATARACT EXTRACTION, BILATERAL     ??? FOOT SURGERY     ??? KIDNEY AND PANCREAS TRANSPLANT 2010     ??? KIDNEY TRANSPLANT 1998     ??? PR ARTHRODESIS,ANKLE,OPEN Right 11/28/2016    Procedure: ARTHRODESIS, ANKLE, OPEN;  Surgeon: Garen Grams, MD;  Location: MAIN [facility];  Service: Ortho Trauma   ??? PR DEBRIDEMENT, SKIN, SUB-Q TISSUE,MUSCLE,BONE,=<20 SQ CM Right 02/22/2013    Procedure: DEBRIDEMENT; SKIN, SUBCUTANEOUS TISSUE, MUSCLE, & BONE FOOT;  Surgeon: Alben Deeds, MD;  Location: MAIN [facility];  Service: Vascular   ??? PR DEBRIDEMENT, SKIN, SUB-Q TISSUE,MUSCLE,BONE,=<20 SQ CM Right 06/10/2013    Procedure: DEBRIDEMENT; SKIN, SUBCUTANEOUS TISSUE, MUSCLE, & BONE;  Surgeon: Madelaine Bhat, DPM;  Location: ASC OR [facility];  Service: Vascular   ??? PR DEBRIDEMENT, SKIN, SUB-Q TISSUE,MUSCLE,BONE,=<20 SQ CM Right 07/22/2013    Procedure: DEBRIDEMENT; SKIN, SUBCUTANEOUS TISSUE, MUSCLE, & BONE;  Surgeon: Madelaine Bhat, DPM;  Location: ASC OR [facility];  Service: Vascular   ??? PR INSERTION DRUG IMPLANT DEVICE Right 09/03/2016    Procedure: Insertion, Non-Biodegradable Drug Delivery Implant;  Surgeon: Garen Grams, MD;  Location: MAIN OR [facility];  Service: Ortho Trauma   ??? PR OSTEOTOMY TIBIA Right 11/28/2016    Procedure: Osteotomy; Tibia;  Surgeon: Garen Grams, MD;  Location: MAIN OR [facility];  Service: Ortho Trauma   ??? PR PARTIAL REMOVAL OF TIBIA Right 09/03/2016    Procedure: Partial Excision, Bone; Tibia;  Surgeon: Garen Grams, MD;  Location: MAIN OR [facility];  Service: Ortho Trauma   ??? PR REMOVAL OF ANKLE IMPLANT Right 09/03/2016    Procedure: REMOVAL OF ANKLE IMPLANT;  Surgeon: Garen Grams, MD;  Location: MAIN OR [facility];  Service: Ortho Trauma   ??? SKIN GRAFTS     ??? TOE AMPUTATION      h/o 5th toe. 02/22/13 Great toe Right due to osteo       Social History:  Tobacco use:   reports that she has never smoked. She has never used smokeless tobacco.  Alcohol use:   reports that she does not drink alcohol.  Drug use:  reports that she does not use drugs.  Living situation: the patient lives with their family.    Family History:  Family History   Problem Relation Age of Onset   ??? Diabetes Mother    ??? Breast cancer Sister         Paternal 1/2 sister   ??? Hypertension Sister    ??? Hypertension Brother    ??? Arrhythmia Brother    ??? Diabetes Maternal Aunt    ??? Diabetes Maternal Uncle    ??? Cancer Maternal Grandmother    ??? No Known Problems Paternal Aunt    ??? No Known Problems Paternal Uncle    ??? No Known Problems Maternal Grandfather    ??? No Known Problems Paternal Grandmother    ??? No Known Problems Paternal Grandfather    ??? Clotting disorder Neg Hx    ??? Anesthesia problems Neg Hx    ??? Broken bones Neg Hx    ??? Collagen disease Neg Hx    ??? Dislocations Neg Hx    ??? Fibromyalgia Neg Hx    ??? Gout Neg Hx    ??? Hemophilia Neg Hx ??? Osteoporosis Neg Hx    ??? Rheumatologic disease Neg Hx    ???  Scoliosis Neg Hx    ??? Severe sprains Neg Hx    ??? Sickle cell anemia Neg Hx    ??? Spinal Compression Fracture Neg Hx    ??? Alcohol abuse Neg Hx    ??? Depression Neg Hx    ??? Drug abuse Neg Hx    ??? Mental illness Neg Hx    ??? Stroke Neg Hx        Review of Systems:  10 systems reviewed and are negative unless otherwise mentioned in HPI      Physical Exam:  Temp:  [36.9 ??C-37 ??C] 37 ??C  Heart Rate:  [78-87] 87  Resp:  [16] 16  BP: (156-164)/(66-72) 164/72  SpO2:  [100 %] 100 %  There is no height or weight on file to calculate BMI.    GEN: NAD, seated in bed  EYES: EOMI, sclera anicteric  ENT: MMM, OP w/o lesions  CV: RRR, no murmurs appreciated  PULM: CTA B  ABD: soft, NT/ND, +BS, no tenderness over graft (LLQ)  EXT: 2+ edema b/l to knees, hyperpigmentation over RLE, R ankle fused  NEURO: No focal deficits  PSYCH: A+Ox3, appropriate      Test Results:  Data Review:  I have reviewed the labs and studies from the last 24 hours.    Imaging: Radiology studies were personally reviewed

## 2017-04-27 NOTE — Unmapped (Signed)
The following has been copied & pasted from current H & P         Care Management  Initial Transition Planning Assessment              General  Care Manager assessed the patient by : In person interview with patient, Medical record review, Discussion with Clinical Care team  Orientation Level: Oriented X4  Who provides care at home?: N/A    Contact/Decision Maker:    Contact Details  Contact Details: Primary Contact  Primary Contact Name: [name]  Primary Contact Relationship: Mother  Phone #1: [phone]  Secondary Contact Name: Greig Castilla   Secondary Contact Relationship: Sibling  Phone #3: [phone]    Advance Directive (Medical Treatment)  Does patient have an advance directive covering medical treatment?: Patient does not have advance directive covering medical treatment.  Reason patient does not have an advance directive covering medical treatment:: Patient does not wish to complete one at this time  Surrogate decision maker appointed:: No  Reason there is not a surrogate decision maker appointed:: Patient does not wish to appoint a surrogate decision maker at this time  Information provided on advance directive:: No  Patient requests assistance:: No    Advance Directive (Mental Health Treatment)  Does patient have an advance directive covering mental health treatment?:  (N/A)  Type of Residence: Mailing Address:  [address]  [place] [address]  Contacts: Contact Details: Primary Contact  Primary Contact Name: [name]  Primary Contact Relationship: Mother  Phone #1: [phone]  Secondary Contact Name: Greig Castilla   Secondary Contact Relationship: Sibling  Phone #3: [phone]  Patient Phone Number: [phone] (home)           Medical Provider(s): Arlyss Gandy, MD  Reason for Admission: Admitting Diagnosis:  s/p transplant, rejection  Past Medical History:   has a past medical history of Anemia; Chronic kidney disease; CTS (carpal tunnel syndrome); Diabetes mellitus (CMS-HCC); GERD (gastroesophageal reflux disease); Hyperlipidemia (01/31/2009); Hypertension; Kidney transplanted; Pancreas transplanted (CMS-HCC); and Pneumonia (2010).  Past Surgical History:   has a past surgical history that includes Cataract extraction, bilateral; KIDNEY TRANSPLANT 1998; KIDNEY AND PANCREAS TRANSPLANT 2010; Toe amputation; SKIN GRAFTS; pr debridement, skin, sub-q tissue,muscle,bone,=<20 sq cm (Right, 02/22/2013); pr debridement, skin, sub-q tissue,muscle,bone,=<20 sq cm (Right, 06/10/2013); pr debridement, skin, sub-q tissue,muscle,bone,=<20 sq cm (Right, 07/22/2013); Carpal tunnel release; Foot surgery; pr removal of ankle implant (Right, 09/03/2016); pr insertion drug implant device (Right, 09/03/2016); pr partial removal of tibia (Right, 09/03/2016); pr arthrodesis,ankle,open (Right, 11/28/2016); and pr osteotomy tibia (Right, 11/28/2016).   Previous admit date: 03/01/2017    Primary Insurance- Payor: MEDICARE / Plan: MEDICARE PART A AND PART B / Product Type: *No Product type* /   Secondary Insurance ??? Secondary Insurance  MEDICAID [place]  Prescription Coverage ??? Yes  Preferred Pharmacy - [facility] - [place], [place] - [address]  [id number] [place] - [address] AT [facility]  [facility] - [place],  - [address]  [place] [facility] - [place],  - [address]    Transportation home: Private vehicle  Level of function prior to admission: Independent        Patient Information:    Lives with: Family members, Parent    Type of Residence: Private residence        Location/Detail: [address] [place]  [address]    Support Systems: Family Members, Friends/Neighbors  Responsibilities/Dependents at home?: No    Home Care services in place prior to admission?: No          Outpatient/Community Resources in place prior to admission: Clinic  Agency detail (Name/Phone #): [facility]- Dr. Lucienne Minks True Equipment Currently Used at Home: walker, rolling       Currently receiving outpatient dialysis?: No       Financial Information:          Need for financial assistance?: No       Discharge Needs Assessment:    Concerns to be Addressed: no discharge needs identified, denies needs/concerns at this time    Clinical Risk Factors: Multiple Diagnoses (Chronic)    Barriers to taking medications: No    Prior overnight hospital stay or ED visit in last 90 days: No    Readmission Within the Last 30 Days: no previous admission in last 30 days         Anticipated Changes Related to Illness: none    Equipment Needed After Discharge: none    Discharge Facility/Level of Care Needs:  (Home with Self care)    Patient at risk for readmission?: Yes    Discharge Plan:    Screen findings are: Care Manager reviewed the plan of the patient's care with the Multidisciplinary Team. No discharge planning needs identified at this time. Care Manager will continue to manage plan and monitor patient's progress with the team.    Expected Discharge Date: 04/28/17    Expected Transfer from Critical Care:  (N/A)    Patient and/or family were provided with choice of facilities / services that are available and appropriate to meet post hospital care needs?: N/A       Initial Assessment complete?: Yes

## 2017-04-27 NOTE — Unmapped (Signed)
Problem: Patient Care Overview  Goal: Plan of Care Review  Outcome: Progressing  Pt came as a direct admit around 8pm last night. VSS. Denied having pain. Contact precaution initiated. Bedside table and call light within pt's reach. Pt is free from fall/injury; will continue to monitor.   Goal: Individualization and Mutuality  Outcome: Progressing    Goal: Discharge Needs Assessment  Outcome: Progressing    Goal: Interprofessional Rounds/Family Conf  Outcome: Progressing      Problem: Fall Risk (Adult)  Goal: Identify Related Risk Factors and Signs and Symptoms  Related risk factors and signs and symptoms are identified upon initiation of Human Response Clinical Practice Guideline (CPG).   Outcome: Progressing    Goal: Absence of Fall  Patient will demonstrate the desired outcomes by discharge/transition of care.   Outcome: Progressing      Problem: Fall Risk, Perinatal (Adult,Obstetrics,Pediatric)  Goal: Identify Related Risk Factors and Signs and Symptoms  Related risk factors and signs and symptoms are identified upon initiation of Human Response Clinical Practice Guideline (CPG).   Outcome: Progressing    Goal: Absence of Maternal Fall  Patient will demonstrate the desired outcomes by discharge/transition of care.   Outcome: Progressing    Goal: Absence of Newborn Fall/Drop  Patient will demonstrate the desired outcomes by discharge/transition of care.   Outcome: Progressing

## 2017-04-28 LAB — CBC W/ AUTO DIFF
BASOPHILS RELATIVE PERCENT: 0 %
EOSINOPHILS ABSOLUTE COUNT: 0 10*9/L (ref 0.0–0.4)
EOSINOPHILS RELATIVE PERCENT: 0.1 %
HEMATOCRIT: 28.7 % — ABNORMAL LOW (ref 36.0–46.0)
HEMOGLOBIN: 8.7 g/dL — ABNORMAL LOW (ref 12.0–16.0)
LARGE UNSTAINED CELLS: 1 % (ref 0–4)
LYMPHOCYTES ABSOLUTE COUNT: 0.3 10*9/L — ABNORMAL LOW (ref 1.5–5.0)
LYMPHOCYTES RELATIVE PERCENT: 1.6 %
MEAN CORPUSCULAR HEMOGLOBIN CONC: 30.2 g/dL — ABNORMAL LOW (ref 31.0–37.0)
MEAN CORPUSCULAR HEMOGLOBIN: 31.2 pg (ref 26.0–34.0)
MEAN CORPUSCULAR VOLUME: 103.4 fL — ABNORMAL HIGH (ref 80.0–100.0)
MEAN PLATELET VOLUME: 8.4 fL (ref 7.0–10.0)
MONOCYTES ABSOLUTE COUNT: 0.6 10*9/L (ref 0.2–0.8)
MONOCYTES RELATIVE PERCENT: 3.2 %
NEUTROPHILS RELATIVE PERCENT: 94.6 %
PLATELET COUNT: 247 10*9/L (ref 150–440)
RED BLOOD CELL COUNT: 2.78 10*12/L — ABNORMAL LOW (ref 4.00–5.20)
RED CELL DISTRIBUTION WIDTH: 18.6 % — ABNORMAL HIGH (ref 12.0–15.0)
WBC ADJUSTED: 17.2 10*9/L — ABNORMAL HIGH (ref 4.5–11.0)

## 2017-04-28 LAB — BASIC METABOLIC PANEL
BLOOD UREA NITROGEN: 35 mg/dL — ABNORMAL HIGH (ref 7–21)
CALCIUM: 9.3 mg/dL (ref 8.5–10.2)
CHLORIDE: 119 mmol/L — ABNORMAL HIGH (ref 98–107)
CO2: 16 mmol/L — ABNORMAL LOW (ref 22.0–30.0)
CREATININE: 1.74 mg/dL — ABNORMAL HIGH (ref 0.60–1.00)
EGFR MDRD AF AMER: 38 mL/min/{1.73_m2} — ABNORMAL LOW (ref >=60)
EGFR MDRD NON AF AMER: 31 mL/min/{1.73_m2} — ABNORMAL LOW (ref >=60)
GLUCOSE RANDOM: 112 mg/dL (ref 65–179)
POTASSIUM: 4.6 mmol/L (ref 3.5–5.0)
SODIUM: 139 mmol/L (ref 135–145)

## 2017-04-28 LAB — GLUCOSE RANDOM: Glucose:MCnc:Pt:Ser/Plas:Qn:: 112

## 2017-04-28 LAB — BASOPHILS RELATIVE PERCENT: Lab: 0

## 2017-04-28 LAB — MAGNESIUM: Magnesium:MCnc:Pt:Ser/Plas:Qn:: 2

## 2017-04-28 MED ORDER — PROGRAF 1 MG CAPSULE
ORAL_CAPSULE | 5 refills | 0 days
Start: 2017-04-28 — End: 2017-05-22

## 2017-04-28 NOTE — Unmapped (Signed)
Physician Discharge Summary    Identifying Information:   Lia Foyer  [birth year]  [id number]    Admit date: 04/26/2017    Discharge date: 04/28/2017     Discharge Service: Nephrology (MDB)    Discharge Attending Physician: No att. 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 [facility] [address] [address] Course:   47 y.o.??female??s/p deceased donor renal and pancreas transplant in 2010, history of UTI, history of chronic right foot wound who presented to [facility] for IV steroids in the setting of ongoing rejection.   ??  Smoldering Rejection  She received a deceased donor kidney and pancreas transplant on 01/31/2009, secondary to Type 1 Diabetes mellitus. 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 She received thymoglobulin, solumedrol. She underwent repeat biopsy 04/04/17, which showed changes suggestive of smoldering minimal/mild tubulointerstitial cellular rejection. She was admitted this time for a 3 day course of solumedrol for suspected ongoing rejection. Discharged on home myfortic dose of 540 mg BID, prednisone 10 mg daily. Tacrolimus dose was increased from 5 mg q AM / 4 mg q PM to 5 mg BID.   ??  Procedures:  iv therapy  _____________________________________________________________________________  Discharge Day Services:  BP 153/73  - Pulse 89  - Temp 37.4 ??C (Oral)  - Resp 16  - Ht 157.5 cm (5' 2.01)  - Wt 68.9 kg (152 lb)  - SpO2 99%  - BMI 27.79 kg/m??   Pt seen on the day of discharge and determined appropriate for discharge.    Condition at Discharge: stable    Length of Discharge: I spent greater than 30 mins in the discharge of this patient.  _____________________________________________________________________________  Discharge Medications:     Your Medication List      CHANGE how you take these medications    amitriptyline 25 MG tablet  Commonly known as:  ELAVIL  Take 1 tablet (25 mg total) by mouth three (3) times a day (at 6am, noon and 6pm).  What changed:  when to take this     PROGRAF 1 MG capsule  Generic drug:  tacrolimus  5mg  in the am and 5mg  in the pm, DAW1, brand medically necessary, tx date 01/31/09, Z94.0, [facility]  What changed:  additional instructions        CONTINUE taking these medications    albuterol 90 mcg/actuation inhaler  Commonly known as:  PROVENTIL HFA;VENTOLIN HFA  Inhale 2 puffs every six (6) hours as needed for wheezing or shortness of breath.     aspirin 81 MG tablet  Commonly known as:  ADULT LOW DOSE ASPIRIN  Take 1 tablet (81 mg total) by mouth daily.     atorvastatin 40 MG tablet  Commonly known as:  LIPITOR  Take 1 tablet (40 mg total) by mouth daily.     calcium-vitamin D 500 mg(1,250mg ) -200 unit per tablet  Generic drug:  calcium-vitamin D  Take 500 mg by mouth daily.     cyclobenzaprine 5 MG tablet  Commonly known as:  FLEXERIL  Take 1-2 tablets (5-10 mg total) by mouth Three (3) times a day as needed for muscle spasms.     DUREZOL 0.05 % Drop  Generic drug:  difluprednate  Administer 1 drop to both eyes daily as needed.     furosemide 20 MG tablet  Commonly known as:  LASIX  Take 1 tablet (20 mg total) by mouth daily as needed.     mycophenolate 180 MG EC tablet  Commonly known as:  MYFORTIC  Take 2 tablets (360 mg total) by mouth Two (2) times a day. Z94.0, DAW1, brand medically necessary     omeprazole 40 MG capsule  Commonly known as:  PriLOSEC  TAKE ONE CAPSULE BY MOUTH TWICE A DAY     PHARMACIST FAVORITE MULTI-VIT per tablet  Generic drug:  multivitamin  Take 1 tablet by mouth daily. Frequency:QD   Dosage:1   TABLET  Instructions:  Note:Dose: 1 TABLET     predniSONE 10 MG tablet  Commonly known as:  DELTASONE  Take 1 tablet (10 mg total) by mouth daily.     sodium bicarbonate 650 mg tablet  Take 2 tablets (1,300 mg total) by mouth Two (2) times a day.     sulfamethoxazole-trimethoprim 400-80 mg per tablet  Commonly known as:  BACTRIM,SEPTRA  Take 1 tablet (80 mg of trimethoprim total) by mouth 3 (three) times a week.     valGANciclovir 450 mg tablet  Commonly known as:  VALCYTE  Take 1 tablet (450 mg total) by mouth daily.     VIGAMOX 0.5 % ophthalmic solution  Generic drug:  moxifloxacin  Administer 1 drop to both eyes daily as needed.          _____________________________________________________________________________  Pending Test Results (if blank, then none):      Most Recent Labs:  Microbiology Results (last day)     ** No results found for the last 24 hours. **          Lab Results   Component Value Date    WBC 17.2 (H) 04/28/2017    HGB 8.7 (L) 04/28/2017    HCT 28.7 (L) 04/28/2017    PLT 247 04/28/2017       Lab Results   Component Value Date    NA 139 04/28/2017    K 4.6 04/28/2017    CL 119 (H) 04/28/2017    CO2 16.0 (L) 04/28/2017    BUN 35 (H) 04/28/2017    CREATININE 1.74 (H) 04/28/2017    CALCIUM 9.3 04/28/2017    MG 2.0 04/28/2017    PHOS 3.9 04/26/2017       Lab Results   Component Value Date    ALKPHOS 79 04/23/2017    BILITOT 0.4 04/23/2017    BILIDIR <0.10 07/02/2016    PROT 6.0 (L) 04/23/2017    ALBUMIN 3.5 04/23/2017    ALT 21 04/23/2017    AST 12 (L) 04/23/2017    GGT 14 04/02/2017       Lab Results   Component Value Date    PT 11.0 04/26/2017    INR 0.96 04/26/2017    APTT 33.2 04/04/2017     Hospital Radiology:  No results found.    _____________________________________________________________________________  Discharge Instructions:               Follow Up instructions and Outpatient Referrals     Discharge instructions       Ms. Dearing, it was a pleasure taking care of you!  You are to resume your prednisone 10 mg as previously prescribed.    You are also to take prograf 5 mg TWICE daily, which is increased from prior. You are to get labs drawn (tacrolimus trough) on this upcoming Thursday.    We have also sent in vitamin D supplementation for you to take daily.     We wish you the best!               Appointments which have been scheduled for you    Jun 18, 2017  8:20 AM EDT  (Arrive by 8:05 AM)  RETURN NEPHROLOGY POST with Brayton Caves, MD  [facility] KIDNEY TRANSPLANT [facility] [address] [place] [facility] REGION) [address]  [place] [address]  [phone]   Jun 27, 2017 10:00 AM EDT  (Arrive by 9:45 AM)  MEDICARE ANNUAL WELLNESS with Theora Master, LCSW  [facility] FAMILY MEDICINE [facility] REGION) [address] [place] [address]  [phone]

## 2017-04-28 NOTE — Unmapped (Addendum)
Problem: Patient Care Overview  Goal: Plan of Care Review  Outcome: Progressing  Pt is alert and oriented X4; VSS. Denied having pain. Contact precaution Maintained. No acute event so far.  IV solu-MEDROL given. Pt will be discharged home this morning.  Bedside table and call light within pt's reach. Pt is free from fall/injury; will continue to monitor.   Goal: Individualization and Mutuality  Outcome: Progressing    Goal: Discharge Needs Assessment  Outcome: Progressing    Goal: Interprofessional Rounds/Family Conf  Outcome: Progressing      Problem: Fall Risk (Adult)  Goal: Identify Related Risk Factors and Signs and Symptoms  Related risk factors and signs and symptoms are identified upon initiation of Human Response Clinical Practice Guideline (CPG).   Outcome: Progressing    Goal: Absence of Fall  Patient will demonstrate the desired outcomes by discharge/transition of care.   Outcome: Progressing      Problem: Fall Risk, Perinatal (Adult,Obstetrics,Pediatric)  Goal: Identify Related Risk Factors and Signs and Symptoms  Related risk factors and signs and symptoms are identified upon initiation of Human Response Clinical Practice Guideline (CPG).   Outcome: Progressing    Goal: Absence of Maternal Fall  Patient will demonstrate the desired outcomes by discharge/transition of care.   Outcome: Progressing    Goal: Absence of Newborn Fall/Drop  Patient will demonstrate the desired outcomes by discharge/transition of care.   Outcome: Progressing      Problem: Self-Care Deficit (Adult,Obstetrics,Pediatric)  Goal: Identify Related Risk Factors and Signs and Symptoms  Related risk factors and signs and symptoms are identified upon initiation of Human Response Clinical Practice Guideline (CPG).   Outcome: Progressing    Goal: Improved Ability to Perform BADL and IADL  Patient will demonstrate the desired outcomes by discharge/transition of care.   Outcome: Progressing      Problem: Kidney Transplant (Adult)  Goal: Signs and Symptoms of Listed Potential Problems Will be Absent, Minimized or Managed (Kidney Transplant)  Signs and symptoms of listed potential problems will be absent, minimized or managed by discharge/transition of care (reference Kidney Transplant (Adult) CPG).   Outcome: Progressing    Goal: Anesthesia/Sedation Recovery  Outcome: Progressing

## 2017-04-28 NOTE — Unmapped (Signed)
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 *         Rachel [name]??is a 47 y.o.??female??s/p deceased donor renal and pancreas transplant in 2010, history of UTI, history of chronic right foot wound who presented to [facility] for IV steroids in the setting of concern for rejection.  ??  S/p Kidney-Pancreas Transplant, e/o rejection: s/p deceased donor kidney and pancreas transplant on 01/31/2009, secondary to Type 1 Diabetes mellitus. Repeat biopsy on 04/04/17 demonstrates changes suggestive of smoldering minimal/mild tubulointerstitial cellular rejection.   -- 3-day course solumedrol 500mg  qd (3/1-3/3) for likely ongoing rejection  -- cont home Myfortic 540mg  BID (recent increase from 360mg )  -- cont valganciclovir 450mg  qd  -- cont bactrim 1 tablet MWF  -- cont home Tacrolimus 5mg  in the AM, increased night dose to from 4mg  to 5mg  today  -- holding pred 10mg  while receiving IV steroids    ??History of Right Foot wound/Osteomeylitis/s/p Revision TTC fusion nail of right ankle: Right ankle chronically more swollen and immobile compared to left. ??Wounds have now all healed. ??Patient follows with orthopedic surgery as an outpatient on prn basis. Last seen 2/28 ??No increased swelling beyond baseline, no pain, no erythema.   ??  Metabolic acidosis: last CO2 15 on 2/26, pt reports adherence to meds, may require dose increase  - con't bicarb 1300 BID  ??  FEN/GI/Proph:  -- no IVF indicated, replace lytes PRN, Regular Diet  -- Home Protonix  -- Heparin [place]  ___________________________________________________________________    Subjective:  Feels well, no complaints. Wants to go home as soon as possible tomorrow.     Labs/Studies:  Labs and Studies from the last 24hrs per EMR and Reviewed    Pressure Ulcer(s)    Active Pressure Ulcer     None                Objective:  Temp:  [36.7 ??C-37.5 ??C] 36.7 ??C  Heart Rate:  [85-94] 85  Resp:  [16-17] 16  BP: (156-164)/(66-75) 162/75  SpO2:  [98 %-100 %] 98 %,   Intake/Output Summary (Last 24 hours) at 04/27/17 1803  Last data filed at 04/27/17 0000   Gross per 24 hour   Intake              355 ml   Output                0 ml   Net              355 ml       General: well-appearing, NAD  CV: normal rate, regular rhythm  Chest: CTAB, no crackles or wheezing  Abdomen: soft with nabs  LE: chronic venous changes (pt reports stable)

## 2017-04-28 NOTE — Unmapped (Signed)
Problem: Patient Care Overview  Goal: Plan of Care Review  Outcome: Progressing  Patient has remained free of falls and injuries so far this shift. All renal/transplant meds given as ordered. IV steroids given as ordered this morning. One dose tylenol given this morning for headache with good result. Patient reports no nausea but a lower than normal appetite for her. Falls precautions in place; she is able to ambulate safely on her own. Vital signs stable. Patient overall has a flat affect and is irritable. Continuing to monitor.   Goal: Individualization and Mutuality  Outcome: Progressing    Goal: Discharge Needs Assessment  Outcome: Progressing    Goal: Interprofessional Rounds/Family Conf  Outcome: Progressing      Problem: Fall Risk (Adult)  Goal: Identify Related Risk Factors and Signs and Symptoms  Related risk factors and signs and symptoms are identified upon initiation of Human Response Clinical Practice Guideline (CPG).   Outcome: Progressing    Goal: Absence of Fall  Patient will demonstrate the desired outcomes by discharge/transition of care.   Outcome: Progressing      Problem: Fall Risk, Perinatal (Adult,Obstetrics,Pediatric)  Goal: Identify Related Risk Factors and Signs and Symptoms  Related risk factors and signs and symptoms are identified upon initiation of Human Response Clinical Practice Guideline (CPG).   Outcome: Progressing    Goal: Absence of Maternal Fall  Patient will demonstrate the desired outcomes by discharge/transition of care.   Outcome: Progressing    Goal: Absence of Newborn Fall/Drop  Patient will demonstrate the desired outcomes by discharge/transition of care.   Outcome: Progressing      Problem: Self-Care Deficit (Adult,Obstetrics,Pediatric)  Goal: Identify Related Risk Factors and Signs and Symptoms  Related risk factors and signs and symptoms are identified upon initiation of Human Response Clinical Practice Guideline (CPG).  Outcome: Progressing    Goal: Improved Ability to Perform BADL and IADL  Patient will demonstrate the desired outcomes by discharge/transition of care.  Outcome: Progressing      Problem: Kidney Transplant (Adult)  Goal: Signs and Symptoms of Listed Potential Problems Will be Absent, Minimized or Managed (Kidney Transplant)  Signs and symptoms of listed potential problems will be absent, minimized or managed by discharge/transition of care (reference Kidney Transplant (Adult) CPG).  Outcome: Progressing    Goal: Anesthesia/Sedation Recovery  Outcome: Progressing

## 2017-05-01 NOTE — Unmapped (Signed)
[place] [facility] of [facility]ations [facility] (CAMP)                                  RECRUITMENT SUMMARY NOTE     ?? Called patient today to introduce CAMP Clinic as part of [facility]  ?? Referral:Yes TOC  ?? Reason for Referral:Transitions of Care Med Rec  ?? Call attempt:1st attempt  ?? Outcome of call: no answer, voicemail left to return call  ?? Discussed the following: Program Services  ?? Program status: Outreach  ??    Evelynn Hench A. Adisyn Ruscitti   Clinical Operations Specialist/CPHT  [place] [facility] of Medication Program (CAMP)

## 2017-05-02 ENCOUNTER — Encounter: Admit: 2017-05-02 | Discharge: 2017-05-02 | Payer: MEDICAID | Attending: Nephrology | Primary: Nephrology

## 2017-05-02 ENCOUNTER — Other Ambulatory Visit
Admission: RE | Admit: 2017-05-02 | Discharge: 2017-05-02 | Disposition: A | Discharge status: Home / Self Care | Payer: Medicare Other | Source: Ambulatory Visit | Attending: Nephrology | Admitting: Nephrology

## 2017-05-02 DIAGNOSIS — D899 Disorder involving the immune mechanism, unspecified: Secondary | ICD-10-CM | POA: Diagnosis not present

## 2017-05-02 DIAGNOSIS — Z09 Encounter for follow-up examination after completed treatment for conditions other than malignant neoplasm: Secondary | ICD-10-CM | POA: Insufficient documentation

## 2017-05-02 DIAGNOSIS — E559 Vitamin D deficiency, unspecified: Secondary | ICD-10-CM | POA: Diagnosis not present

## 2017-05-02 DIAGNOSIS — N390 Urinary tract infection, site not specified: Secondary | ICD-10-CM | POA: Diagnosis not present

## 2017-05-02 DIAGNOSIS — Z940 Kidney transplant status: Secondary | ICD-10-CM | POA: Insufficient documentation

## 2017-05-02 DIAGNOSIS — Z79899 Other long term (current) drug therapy: Secondary | ICD-10-CM | POA: Diagnosis present

## 2017-05-02 DIAGNOSIS — E1129 Type 2 diabetes mellitus with other diabetic kidney complication: Secondary | ICD-10-CM | POA: Diagnosis not present

## 2017-05-02 DIAGNOSIS — B259 Cytomegaloviral disease, unspecified: Secondary | ICD-10-CM | POA: Insufficient documentation

## 2017-05-02 DIAGNOSIS — D631 Anemia in chronic kidney disease: Secondary | ICD-10-CM | POA: Diagnosis not present

## 2017-05-02 DIAGNOSIS — Z789 Other specified health status: Secondary | ICD-10-CM | POA: Insufficient documentation

## 2017-05-02 DIAGNOSIS — Z9483 Pancreas transplant status: Secondary | ICD-10-CM | POA: Diagnosis not present

## 2017-05-02 LAB — AMYLASE: Amylase: 66 U/L (ref 28–100)

## 2017-05-02 LAB — BASIC METABOLIC PANEL
Anion gap: 8 (ref 5–15)
BUN: 36 mg/dL — AB (ref 6–20)
CALCIUM: 8.4 mg/dL — AB (ref 8.9–10.3)
CO2: 15 mmol/L — ABNORMAL LOW (ref 22–32)
Chloride: 116 mmol/L — ABNORMAL HIGH (ref 101–111)
Creatinine, Ser: 1.64 mg/dL — ABNORMAL HIGH (ref 0.44–1.00)
GFR calc Af Amer: 42 mL/min — ABNORMAL LOW (ref >60)
GFR, EST NON AFRICAN AMERICAN: 36 mL/min — AB (ref >60)
GLUCOSE: 101 mg/dL — AB (ref 65–99)
Potassium: 4.4 mmol/L (ref 3.5–5.1)
Sodium: 139 mmol/L (ref 135–145)

## 2017-05-02 LAB — CBC WITH DIFFERENTIAL/PLATELET
BASOS ABS: 0 10*3/uL (ref 0–0.1)
BASOS PCT: 0 %
EOS PCT: 3 %
Eosinophils Absolute: 0.2 10*3/uL (ref 0–0.7)
HEMATOCRIT: 29.8 % — AB (ref 35.0–47.0)
Hemoglobin: 9.5 g/dL — ABNORMAL LOW (ref 12.0–16.0)
Lymphocytes Relative: 3 %
Lymphs Abs: 0.3 10*3/uL — ABNORMAL LOW (ref 1.0–3.6)
MCH: 31.3 pg (ref 26.0–34.0)
MCHC: 31.9 g/dL — AB (ref 32.0–36.0)
MCV: 98.1 fL (ref 80.0–100.0)
MONO ABS: 0.5 10*3/uL (ref 0.2–0.9)
Monocytes Relative: 7 %
Neutro Abs: 6.6 10*3/uL — ABNORMAL HIGH (ref 1.4–6.5)
Neutrophils Relative %: 87 %
Platelets: 191 10*3/uL (ref 150–440)
RBC: 3.04 MIL/uL — ABNORMAL LOW (ref 3.80–5.20)
RDW: 19 % — AB (ref 11.5–14.5)
WBC: 7.6 10*3/uL (ref 3.6–11.0)

## 2017-05-02 LAB — LIPASE, BLOOD: Lipase: 37 U/L (ref 11–51)

## 2017-05-02 LAB — PHOSPHORUS: PHOSPHORUS: 3.9 mg/dL (ref 2.5–4.6)

## 2017-05-02 LAB — MAGNESIUM: Magnesium: 1.8 mg/dL (ref 1.7–2.4)

## 2017-05-02 NOTE — Unmapped (Signed)
I called pt to let her know that her labs were better today but that she still needs to get labs 2 x week pending next weeks' level

## 2017-05-02 NOTE — Unmapped (Signed)
[place] [facility] of Medications Program (CAMP)                                  RECRUITMENT SUMMARY NOTE     ?? Called patient today to introduce CAMP Clinic as part of [facility]  ?? Referral:Yes TOC  ?? Reason for Referral:Transitions of Care Med Rec  ?? Call attempt:2nd attempt  ?? Outcome of call: left message with family member to return call at  earliest convenience  ?? Discussed the following: Program Services  ?? Program status: Unable to contact  ??    Ramandeep Arington A. Slayden Mennenga   Clinical Operations Specialist/CPHT  [place] Assessment of Medication Program (CAMP)

## 2017-05-05 LAB — HLA DS POST TRANSPLANT
ANTI-DONOR DRW #1 MFI: 86 MFI
ANTI-DONOR HLA-A #1 MFI: 17 MFI
ANTI-DONOR HLA-B #1 MFI: 14 MFI
ANTI-DONOR HLA-B #2 MFI: 42 MFI
ANTI-DONOR HLA-C #2 MFI: 65 MFI
ANTI-DONOR HLA-DR #1 MFI: 57 MFI
ANTI-DONOR HLA-DR #2 MFI: 34 MFI
DONOR DRW ANTIGEN #1: 52
DONOR DRW ANTIGEN #2: 53
DONOR HLA-A ANTIGEN #1: 2
DONOR HLA-B ANTIGEN #1: 51
DONOR HLA-B ANTIGEN #2: 71
DONOR HLA-C ANTIGEN #1: 7
DONOR HLA-C ANTIGEN #2: 14
DONOR HLA-DQB ANTIGEN #1: 7
DONOR HLA-DR ANTIGEN #1: 4
DONOR HLA-DR ANTIGEN #2: 11

## 2017-05-05 LAB — ANTI-DONOR HLA-C #2 MFI: Lab: 65

## 2017-05-05 LAB — FSAB CLASS 2 ANTIBODY SPECIFICITY

## 2017-05-05 LAB — FSAB CLASS 1 ANTIBODY SPECIFICITY

## 2017-05-05 LAB — HLA CL1 ANTIBODY COMM: Lab: 0

## 2017-05-05 LAB — HLA CL2 AB COMMENT: Lab: 0

## 2017-05-05 MED ORDER — MYCOPHENOLATE SODIUM 180 MG TABLET,DELAYED RELEASE
ORAL_TABLET | Freq: Two times a day (BID) | ORAL | 5 refills | 0 days
Start: 2017-05-05 — End: 2017-07-11

## 2017-05-06 DIAGNOSIS — Z940 Kidney transplant status: Principal | ICD-10-CM

## 2017-05-06 LAB — TACROLIMUS, TROUGH: Lab: 7.4

## 2017-05-06 MED ORDER — SODIUM BICARBONATE 650 MG TABLET
ORAL_TABLET | Freq: Three times a day (TID) | ORAL | 11 refills | 0.00000 days | Status: CP
Start: 2017-05-06 — End: 2017-07-11

## 2017-05-06 NOTE — Unmapped (Signed)
Bicarb level is still low, per Dr. Carlene Coria, will increase NaCO2 to 2 tablets tid

## 2017-05-07 ENCOUNTER — Encounter: Admit: 2017-05-07 | Discharge: 2017-05-07 | Payer: MEDICAID | Attending: Nephrology | Primary: Nephrology

## 2017-05-07 ENCOUNTER — Other Ambulatory Visit
Admission: RE | Admit: 2017-05-07 | Discharge: 2017-05-07 | Disposition: A | Discharge status: Home / Self Care | Payer: Medicare Other | Source: Ambulatory Visit | Attending: Nephrology | Admitting: Nephrology

## 2017-05-07 DIAGNOSIS — Z940 Kidney transplant status: Secondary | ICD-10-CM | POA: Diagnosis not present

## 2017-05-07 DIAGNOSIS — D631 Anemia in chronic kidney disease: Secondary | ICD-10-CM | POA: Insufficient documentation

## 2017-05-07 DIAGNOSIS — D899 Disorder involving the immune mechanism, unspecified: Secondary | ICD-10-CM | POA: Diagnosis not present

## 2017-05-07 DIAGNOSIS — E1129 Type 2 diabetes mellitus with other diabetic kidney complication: Secondary | ICD-10-CM | POA: Insufficient documentation

## 2017-05-07 DIAGNOSIS — E559 Vitamin D deficiency, unspecified: Secondary | ICD-10-CM | POA: Insufficient documentation

## 2017-05-07 DIAGNOSIS — B259 Cytomegaloviral disease, unspecified: Secondary | ICD-10-CM | POA: Insufficient documentation

## 2017-05-07 DIAGNOSIS — Z9483 Pancreas transplant status: Secondary | ICD-10-CM | POA: Diagnosis not present

## 2017-05-07 DIAGNOSIS — Z79899 Other long term (current) drug therapy: Secondary | ICD-10-CM | POA: Insufficient documentation

## 2017-05-07 DIAGNOSIS — N189 Chronic kidney disease, unspecified: Secondary | ICD-10-CM | POA: Diagnosis not present

## 2017-05-07 LAB — CBC WITH DIFFERENTIAL/PLATELET
BASOS ABS: 0 10*3/uL (ref 0–0.1)
BASOS PCT: 0 %
EOS ABS: 0.1 10*3/uL (ref 0–0.7)
Eosinophils Relative: 2 %
HEMATOCRIT: 28.6 % — AB (ref 35.0–47.0)
HEMOGLOBIN: 9 g/dL — AB (ref 12.0–16.0)
Lymphocytes Relative: 2 %
Lymphs Abs: 0.2 10*3/uL — ABNORMAL LOW (ref 1.0–3.6)
MCH: 31.3 pg (ref 26.0–34.0)
MCHC: 31.3 g/dL — AB (ref 32.0–36.0)
MCV: 100 fL (ref 80.0–100.0)
MONOS PCT: 7 %
Monocytes Absolute: 0.7 10*3/uL (ref 0.2–0.9)
NEUTROS ABS: 7.9 10*3/uL — AB (ref 1.4–6.5)
NEUTROS PCT: 89 %
Platelets: 191 10*3/uL (ref 150–440)
RBC: 2.86 MIL/uL — AB (ref 3.80–5.20)
RDW: 19.1 % — ABNORMAL HIGH (ref 11.5–14.5)
WBC: 8.9 10*3/uL (ref 3.6–11.0)

## 2017-05-07 LAB — PHOSPHORUS
Lab: 3.9
PHOSPHORUS: 3.6 mg/dL (ref 2.5–4.6)

## 2017-05-07 LAB — BASIC METABOLIC PANEL
ANION GAP: 7 (ref 5–15)
BLOOD UREA NITROGEN: 36 mg/dL — ABNORMAL HIGH
BUN: 30 mg/dL — AB (ref 6–20)
CALCIUM: 8.4 mg/dL — ABNORMAL LOW
CHLORIDE: 116 mmol/L — ABNORMAL HIGH
CHLORIDE: 116 mmol/L — AB (ref 101–111)
CO2: 15 mmol/L — ABNORMAL LOW
CO2: 16 mmol/L — ABNORMAL LOW (ref 22–32)
Calcium: 8.6 mg/dL — ABNORMAL LOW (ref 8.9–10.3)
Creatinine, Ser: 1.47 mg/dL — ABNORMAL HIGH (ref 0.44–1.00)
EGFR MDRD AF AMER: 42 mL/min/{1.73_m2} — ABNORMAL LOW
GFR calc Af Amer: 48 mL/min — ABNORMAL LOW (ref >60)
GFR calc non Af Amer: 41 mL/min — ABNORMAL LOW (ref >60)
GLUCOSE RANDOM: 101 mg/dL — ABNORMAL HIGH
Glucose, Bld: 128 mg/dL — ABNORMAL HIGH (ref 65–99)
POTASSIUM: 4.4 mmol/L
POTASSIUM: 4.5 mmol/L (ref 3.5–5.1)
SODIUM: 139 mmol/L (ref 135–145)

## 2017-05-07 LAB — MAGNESIUM
Lab: 1.8
Magnesium: 1.9 mg/dL (ref 1.7–2.4)

## 2017-05-07 LAB — AMYLASE
AMYLASE: 58 U/L (ref 28–100)
Lab: 66

## 2017-05-07 LAB — CBC W/ DIFFERENTIAL
BASOPHILS ABSOLUTE COUNT: 0 10*9/L
BASOPHILS RELATIVE PERCENT: 0 %
EOSINOPHILS ABSOLUTE COUNT: 0.2 10*9/L
EOSINOPHILS RELATIVE PERCENT: 3 %
HEMATOCRIT: 29.8 % — ABNORMAL LOW
HEMOGLOBIN: 9.5 g/dL — ABNORMAL LOW
LYMPHOCYTES ABSOLUTE COUNT: 0.3 10*9/L — ABNORMAL LOW
LYMPHOCYTES RELATIVE PERCENT: 3 %
MEAN CORPUSCULAR HEMOGLOBIN CONC: 31.9 g/dL — ABNORMAL LOW
MEAN CORPUSCULAR HEMOGLOBIN: 31.3 pg
MEAN CORPUSCULAR VOLUME: 98.1 fL
MONOCYTES ABSOLUTE COUNT: 0.5 10*9/L
MONOCYTES RELATIVE PERCENT: 7 %
NEUTROPHILS ABSOLUTE COUNT: 6.6 10*9/L — ABNORMAL HIGH
NEUTROPHILS RELATIVE PERCENT: 87 %
PLATELET COUNT: 191 10*9/L
RED CELL DISTRIBUTION WIDTH: 19 % — ABNORMAL HIGH
WBC ADJUSTED: 7.6 10*9/L

## 2017-05-07 LAB — LIPASE: Lab: 37

## 2017-05-07 LAB — NEUTROPHILS RELATIVE PERCENT: Lab: 87

## 2017-05-07 LAB — CO2: Lab: 15 — ABNORMAL LOW

## 2017-05-08 NOTE — Unmapped (Signed)
Received Therapeutic shoe form from [place], [place]. Request for PCP to complete form and return with clinical foot exam and/or comprehensive care plan. Placed in Dr. Jacqualin Combes complete and sign folder

## 2017-05-13 DIAGNOSIS — Z940 Kidney transplant status: Principal | ICD-10-CM

## 2017-05-14 ENCOUNTER — Encounter: Admit: 2017-05-14 | Discharge: 2017-05-14 | Payer: MEDICAID | Attending: Nephrology | Primary: Nephrology

## 2017-05-14 ENCOUNTER — Other Ambulatory Visit
Admission: RE | Admit: 2017-05-14 | Discharge: 2017-05-14 | Disposition: A | Discharge status: Home / Self Care | Payer: Medicare Other | Source: Ambulatory Visit | Attending: Nephrology | Admitting: Nephrology

## 2017-05-14 DIAGNOSIS — Z79899 Other long term (current) drug therapy: Secondary | ICD-10-CM | POA: Diagnosis not present

## 2017-05-14 DIAGNOSIS — D631 Anemia in chronic kidney disease: Secondary | ICD-10-CM | POA: Insufficient documentation

## 2017-05-14 DIAGNOSIS — E1129 Type 2 diabetes mellitus with other diabetic kidney complication: Secondary | ICD-10-CM | POA: Insufficient documentation

## 2017-05-14 DIAGNOSIS — E559 Vitamin D deficiency, unspecified: Secondary | ICD-10-CM | POA: Diagnosis not present

## 2017-05-14 DIAGNOSIS — Z9483 Pancreas transplant status: Secondary | ICD-10-CM | POA: Insufficient documentation

## 2017-05-14 DIAGNOSIS — N189 Chronic kidney disease, unspecified: Secondary | ICD-10-CM | POA: Insufficient documentation

## 2017-05-14 DIAGNOSIS — D899 Disorder involving the immune mechanism, unspecified: Secondary | ICD-10-CM | POA: Diagnosis not present

## 2017-05-14 DIAGNOSIS — Z940 Kidney transplant status: Secondary | ICD-10-CM | POA: Diagnosis not present

## 2017-05-14 LAB — CBC WITH DIFFERENTIAL/PLATELET
Basophils Absolute: 0 10*3/uL (ref 0–0.1)
Basophils Relative: 1 %
EOS ABS: 0.2 10*3/uL (ref 0–0.7)
EOS PCT: 4 %
HCT: 27.9 % — ABNORMAL LOW (ref 35.0–47.0)
Hemoglobin: 9 g/dL — ABNORMAL LOW (ref 12.0–16.0)
LYMPHS ABS: 0.1 10*3/uL — AB (ref 1.0–3.6)
Lymphocytes Relative: 2 %
MCH: 32.1 pg (ref 26.0–34.0)
MCHC: 32.2 g/dL (ref 32.0–36.0)
MCV: 99.8 fL (ref 80.0–100.0)
MONOS PCT: 7 %
Monocytes Absolute: 0.4 10*3/uL (ref 0.2–0.9)
Neutro Abs: 4.7 10*3/uL (ref 1.4–6.5)
Neutrophils Relative %: 86 %
PLATELETS: 190 10*3/uL (ref 150–440)
RBC: 2.79 MIL/uL — ABNORMAL LOW (ref 3.80–5.20)
RDW: 17.5 % — AB (ref 11.5–14.5)
WBC: 5.4 10*3/uL (ref 3.6–11.0)

## 2017-05-14 LAB — BASIC METABOLIC PANEL
Anion gap: 7 (ref 5–15)
BUN: 35 mg/dL — AB (ref 6–20)
CALCIUM: 8.6 mg/dL — AB (ref 8.9–10.3)
CO2: 19 mmol/L — ABNORMAL LOW (ref 22–32)
Chloride: 115 mmol/L — ABNORMAL HIGH (ref 101–111)
Creatinine, Ser: 1.52 mg/dL — ABNORMAL HIGH (ref 0.44–1.00)
GFR calc Af Amer: 46 mL/min — ABNORMAL LOW (ref >60)
GFR, EST NON AFRICAN AMERICAN: 40 mL/min — AB (ref >60)
GLUCOSE: 96 mg/dL (ref 65–99)
Potassium: 4.4 mmol/L (ref 3.5–5.1)
SODIUM: 141 mmol/L (ref 135–145)

## 2017-05-14 LAB — MAGNESIUM: Magnesium: 2 mg/dL (ref 1.7–2.4)

## 2017-05-14 LAB — AMYLASE: AMYLASE: 59 U/L (ref 28–100)

## 2017-05-14 LAB — PHOSPHORUS: PHOSPHORUS: 3.8 mg/dL (ref 2.5–4.6)

## 2017-05-14 LAB — LIPASE, BLOOD: Lipase: 42 U/L (ref 11–51)

## 2017-05-16 ENCOUNTER — Encounter: Admit: 2017-05-16 | Discharge: 2017-05-17 | Payer: MEDICAID | Attending: Vascular Surgery | Primary: Vascular Surgery

## 2017-05-16 DIAGNOSIS — L97512 Non-pressure chronic ulcer of other part of right foot with fat layer exposed: Principal | ICD-10-CM

## 2017-05-16 DIAGNOSIS — I739 Peripheral vascular disease, unspecified: Secondary | ICD-10-CM

## 2017-05-16 MED ORDER — SODIUM HYPOCHLORITE 0.25 % SOLUTION
Freq: Every day | TOPICAL | 0 refills | 0 days | Status: CP
Start: 2017-05-16 — End: ?

## 2017-05-16 MED ORDER — SILVER SULFADIAZINE 1 % TOPICAL CREAM
Freq: Every day | TOPICAL | 3 refills | 0 days | Status: CP
Start: 2017-05-16 — End: 2018-06-11

## 2017-05-17 DIAGNOSIS — Z940 Kidney transplant status: Principal | ICD-10-CM

## 2017-05-21 ENCOUNTER — Encounter: Admit: 2017-05-21 | Discharge: 2017-05-21 | Payer: MEDICAID | Attending: Nephrology | Primary: Nephrology

## 2017-05-21 ENCOUNTER — Other Ambulatory Visit
Admission: RE | Admit: 2017-05-21 | Discharge: 2017-05-21 | Disposition: A | Discharge status: Home / Self Care | Payer: Medicare Other | Source: Ambulatory Visit | Attending: Nephrology | Admitting: Nephrology

## 2017-05-21 DIAGNOSIS — B259 Cytomegaloviral disease, unspecified: Secondary | ICD-10-CM | POA: Insufficient documentation

## 2017-05-21 DIAGNOSIS — Z940 Kidney transplant status: Secondary | ICD-10-CM | POA: Diagnosis present

## 2017-05-21 DIAGNOSIS — N390 Urinary tract infection, site not specified: Secondary | ICD-10-CM | POA: Diagnosis not present

## 2017-05-21 DIAGNOSIS — Z789 Other specified health status: Secondary | ICD-10-CM | POA: Insufficient documentation

## 2017-05-21 DIAGNOSIS — Z9483 Pancreas transplant status: Secondary | ICD-10-CM | POA: Insufficient documentation

## 2017-05-21 DIAGNOSIS — D631 Anemia in chronic kidney disease: Secondary | ICD-10-CM | POA: Diagnosis not present

## 2017-05-21 DIAGNOSIS — Z114 Encounter for screening for human immunodeficiency virus [HIV]: Secondary | ICD-10-CM | POA: Diagnosis not present

## 2017-05-21 DIAGNOSIS — E1129 Type 2 diabetes mellitus with other diabetic kidney complication: Secondary | ICD-10-CM | POA: Insufficient documentation

## 2017-05-21 DIAGNOSIS — Z79899 Other long term (current) drug therapy: Secondary | ICD-10-CM | POA: Diagnosis present

## 2017-05-21 DIAGNOSIS — E559 Vitamin D deficiency, unspecified: Secondary | ICD-10-CM | POA: Diagnosis not present

## 2017-05-21 DIAGNOSIS — D899 Disorder involving the immune mechanism, unspecified: Secondary | ICD-10-CM | POA: Insufficient documentation

## 2017-05-21 DIAGNOSIS — Z09 Encounter for follow-up examination after completed treatment for conditions other than malignant neoplasm: Secondary | ICD-10-CM | POA: Insufficient documentation

## 2017-05-21 LAB — CBC WITH DIFFERENTIAL/PLATELET
BASOS ABS: 0 10*3/uL (ref 0–0.1)
BASOS PCT: 1 %
EOS ABS: 0.2 10*3/uL (ref 0–0.7)
EOS PCT: 3 %
HCT: 28.3 % — ABNORMAL LOW (ref 35.0–47.0)
HEMOGLOBIN: 9.1 g/dL — AB (ref 12.0–16.0)
LYMPHS ABS: 0.3 10*3/uL — AB (ref 1.0–3.6)
Lymphocytes Relative: 5 %
MCH: 31.9 pg (ref 26.0–34.0)
MCHC: 32 g/dL (ref 32.0–36.0)
MCV: 99.6 fL (ref 80.0–100.0)
Monocytes Absolute: 0.6 10*3/uL (ref 0.2–0.9)
Monocytes Relative: 9 %
NEUTROS PCT: 82 %
Neutro Abs: 5.1 10*3/uL (ref 1.4–6.5)
PLATELETS: 196 10*3/uL (ref 150–440)
RBC: 2.84 MIL/uL — AB (ref 3.80–5.20)
RDW: 17.2 % — ABNORMAL HIGH (ref 11.5–14.5)
WBC: 6.1 10*3/uL (ref 3.6–11.0)

## 2017-05-21 LAB — BASIC METABOLIC PANEL
Anion gap: 9 (ref 5–15)
BUN: 35 mg/dL — ABNORMAL HIGH (ref 6–20)
CALCIUM: 8.7 mg/dL — AB (ref 8.9–10.3)
CO2: 20 mmol/L — ABNORMAL LOW (ref 22–32)
Chloride: 113 mmol/L — ABNORMAL HIGH (ref 101–111)
Creatinine, Ser: 1.87 mg/dL — ABNORMAL HIGH (ref 0.44–1.00)
GFR, EST AFRICAN AMERICAN: 36 mL/min — AB (ref >60)
GFR, EST NON AFRICAN AMERICAN: 31 mL/min — AB (ref >60)
GLUCOSE: 94 mg/dL (ref 65–99)
POTASSIUM: 4.5 mmol/L (ref 3.5–5.1)
Sodium: 142 mmol/L (ref 135–145)

## 2017-05-21 LAB — MAGNESIUM: MAGNESIUM: 2.2 mg/dL (ref 1.7–2.4)

## 2017-05-21 LAB — PHOSPHORUS: Phosphorus: 4.7 mg/dL — ABNORMAL HIGH (ref 2.5–4.6)

## 2017-05-21 LAB — LIPASE, BLOOD: LIPASE: 29 U/L (ref 11–51)

## 2017-05-21 LAB — AMYLASE: AMYLASE: 49 U/L (ref 28–100)

## 2017-05-22 DIAGNOSIS — Z940 Kidney transplant status: Principal | ICD-10-CM

## 2017-05-22 MED ORDER — PROGRAF 1 MG CAPSULE
ORAL_CAPSULE | 5 refills | 0 days
Start: 2017-05-22 — End: 2017-07-11

## 2017-05-28 ENCOUNTER — Other Ambulatory Visit
Admission: RE | Admit: 2017-05-28 | Discharge: 2017-05-28 | Disposition: A | Discharge status: Home / Self Care | Payer: Medicare Other | Source: Ambulatory Visit | Attending: Nephrology | Admitting: Nephrology

## 2017-05-30 ENCOUNTER — Encounter: Admit: 2017-05-30 | Discharge: 2017-05-30 | Payer: MEDICAID | Attending: Nephrology | Primary: Nephrology

## 2017-05-30 ENCOUNTER — Other Ambulatory Visit
Admission: RE | Admit: 2017-05-30 | Discharge: 2017-05-30 | Disposition: A | Discharge status: Home / Self Care | Payer: Medicare Other | Source: Ambulatory Visit | Attending: Nephrology | Admitting: Nephrology

## 2017-05-30 DIAGNOSIS — T8610 Unspecified complication of kidney transplant: Secondary | ICD-10-CM | POA: Diagnosis not present

## 2017-05-30 DIAGNOSIS — Z940 Kidney transplant status: Secondary | ICD-10-CM | POA: Insufficient documentation

## 2017-05-30 DIAGNOSIS — D899 Disorder involving the immune mechanism, unspecified: Secondary | ICD-10-CM | POA: Diagnosis present

## 2017-05-30 DIAGNOSIS — N390 Urinary tract infection, site not specified: Secondary | ICD-10-CM | POA: Diagnosis not present

## 2017-05-30 DIAGNOSIS — Z9483 Pancreas transplant status: Secondary | ICD-10-CM | POA: Diagnosis not present

## 2017-05-30 DIAGNOSIS — D631 Anemia in chronic kidney disease: Secondary | ICD-10-CM | POA: Diagnosis not present

## 2017-05-30 DIAGNOSIS — Z114 Encounter for screening for human immunodeficiency virus [HIV]: Secondary | ICD-10-CM | POA: Insufficient documentation

## 2017-05-30 DIAGNOSIS — B259 Cytomegaloviral disease, unspecified: Secondary | ICD-10-CM | POA: Diagnosis not present

## 2017-05-30 DIAGNOSIS — Z09 Encounter for follow-up examination after completed treatment for conditions other than malignant neoplasm: Secondary | ICD-10-CM | POA: Diagnosis not present

## 2017-05-30 DIAGNOSIS — Z789 Other specified health status: Secondary | ICD-10-CM | POA: Diagnosis not present

## 2017-05-30 DIAGNOSIS — Z79899 Other long term (current) drug therapy: Secondary | ICD-10-CM | POA: Diagnosis not present

## 2017-05-30 DIAGNOSIS — E1129 Type 2 diabetes mellitus with other diabetic kidney complication: Secondary | ICD-10-CM | POA: Insufficient documentation

## 2017-05-30 LAB — BASIC METABOLIC PANEL
ANION GAP: 5 (ref 5–15)
BUN: 31 mg/dL — ABNORMAL HIGH (ref 6–20)
CHLORIDE: 114 mmol/L — AB (ref 101–111)
CO2: 23 mmol/L (ref 22–32)
Calcium: 8.8 mg/dL — ABNORMAL LOW (ref 8.9–10.3)
Creatinine, Ser: 1.72 mg/dL — ABNORMAL HIGH (ref 0.44–1.00)
GFR calc Af Amer: 40 mL/min — ABNORMAL LOW (ref >60)
GFR, EST NON AFRICAN AMERICAN: 34 mL/min — AB (ref >60)
Glucose, Bld: 89 mg/dL (ref 65–99)
POTASSIUM: 4.9 mmol/L (ref 3.5–5.1)
SODIUM: 142 mmol/L (ref 135–145)

## 2017-05-30 LAB — CBC WITH DIFFERENTIAL/PLATELET
BASOS ABS: 0 10*3/uL (ref 0–0.1)
BASOS PCT: 1 %
EOS ABS: 0.2 10*3/uL (ref 0–0.7)
Eosinophils Relative: 3 %
HCT: 29.6 % — ABNORMAL LOW (ref 35.0–47.0)
HEMOGLOBIN: 9.3 g/dL — AB (ref 12.0–16.0)
Lymphocytes Relative: 4 %
Lymphs Abs: 0.2 10*3/uL — ABNORMAL LOW (ref 1.0–3.6)
MCH: 31.1 pg (ref 26.0–34.0)
MCHC: 31.4 g/dL — ABNORMAL LOW (ref 32.0–36.0)
MCV: 99.1 fL (ref 80.0–100.0)
Monocytes Absolute: 0.6 10*3/uL (ref 0.2–0.9)
Monocytes Relative: 12 %
NEUTROS ABS: 4.4 10*3/uL (ref 1.4–6.5)
NEUTROS PCT: 80 %
Platelets: 187 10*3/uL (ref 150–440)
RBC: 2.99 MIL/uL — AB (ref 3.80–5.20)
RDW: 16.1 % — ABNORMAL HIGH (ref 11.5–14.5)
WBC: 5.5 10*3/uL (ref 3.6–11.0)

## 2017-05-30 LAB — LIPASE, BLOOD: Lipase: 26 U/L (ref 11–51)

## 2017-05-30 LAB — PHOSPHORUS: PHOSPHORUS: 3.8 mg/dL (ref 2.5–4.6)

## 2017-05-30 LAB — MAGNESIUM: Magnesium: 1.9 mg/dL (ref 1.7–2.4)

## 2017-05-30 LAB — AMYLASE: AMYLASE: 44 U/L (ref 28–100)

## 2017-05-30 MED ORDER — PREDNISONE 10 MG TABLET
ORAL_TABLET | Freq: Every day | ORAL | 11 refills | 0.00000 days | Status: CP
Start: 2017-05-30 — End: 2017-05-30

## 2017-05-30 MED ORDER — PREDNISONE 10 MG TABLET: 10 mg | tablet | Freq: Every day | 11 refills | 0 days | Status: AC

## 2017-06-03 DIAGNOSIS — Z940 Kidney transplant status: Principal | ICD-10-CM

## 2017-06-06 ENCOUNTER — Encounter: Admit: 2017-06-06 | Discharge: 2017-06-06 | Payer: MEDICAID | Attending: Podiatrist | Primary: Podiatrist

## 2017-06-06 ENCOUNTER — Encounter: Admit: 2017-06-06 | Discharge: 2017-06-06 | Payer: MEDICAID

## 2017-06-06 ENCOUNTER — Ambulatory Visit: Admit: 2017-06-06 | Discharge: 2017-06-06 | Payer: MEDICAID

## 2017-06-06 DIAGNOSIS — Z79899 Other long term (current) drug therapy: Secondary | ICD-10-CM

## 2017-06-06 DIAGNOSIS — E11610 Type 2 diabetes mellitus with diabetic neuropathic arthropathy: Secondary | ICD-10-CM

## 2017-06-06 DIAGNOSIS — S82842K Displaced bimalleolar fracture of left lower leg, subsequent encounter for closed fracture with nonunion: Secondary | ICD-10-CM

## 2017-06-06 DIAGNOSIS — Z9483 Pancreas transplant status: Secondary | ICD-10-CM

## 2017-06-06 DIAGNOSIS — E11621 Type 2 diabetes mellitus with foot ulcer: Principal | ICD-10-CM

## 2017-06-06 DIAGNOSIS — L97519 Non-pressure chronic ulcer of other part of right foot with unspecified severity: Secondary | ICD-10-CM

## 2017-06-06 DIAGNOSIS — Z940 Kidney transplant status: Principal | ICD-10-CM

## 2017-06-06 DIAGNOSIS — S82891S Other fracture of right lower leg, sequela: Principal | ICD-10-CM

## 2017-06-18 ENCOUNTER — Encounter: Admit: 2017-06-18 | Discharge: 2017-06-18 | Payer: MEDICAID

## 2017-06-18 ENCOUNTER — Encounter: Admit: 2017-06-18 | Discharge: 2017-06-18 | Payer: MEDICAID | Attending: Nephrology | Primary: Nephrology

## 2017-06-18 DIAGNOSIS — Z79899 Other long term (current) drug therapy: Secondary | ICD-10-CM

## 2017-06-18 DIAGNOSIS — D899 Disorder involving the immune mechanism, unspecified: Secondary | ICD-10-CM

## 2017-06-18 DIAGNOSIS — Z940 Kidney transplant status: Principal | ICD-10-CM

## 2017-06-24 MED ORDER — PREDNISONE 2.5 MG TABLET
ORAL_TABLET | Freq: Every day | ORAL | 11 refills | 0.00000 days | Status: CP
Start: 2017-06-24 — End: 2017-07-11

## 2017-06-27 ENCOUNTER — Encounter: Admit: 2017-06-27 | Discharge: 2017-06-27 | Payer: MEDICAID | Attending: Podiatrist | Primary: Podiatrist

## 2017-06-27 ENCOUNTER — Encounter: Admit: 2017-06-27 | Discharge: 2017-06-27 | Payer: MEDICAID

## 2017-06-27 DIAGNOSIS — L97519 Non-pressure chronic ulcer of other part of right foot with unspecified severity: Secondary | ICD-10-CM

## 2017-06-27 DIAGNOSIS — L97512 Non-pressure chronic ulcer of other part of right foot with fat layer exposed: Principal | ICD-10-CM

## 2017-06-27 DIAGNOSIS — I739 Peripheral vascular disease, unspecified: Secondary | ICD-10-CM

## 2017-06-27 DIAGNOSIS — E11610 Type 2 diabetes mellitus with diabetic neuropathic arthropathy: Secondary | ICD-10-CM

## 2017-06-27 DIAGNOSIS — E11621 Type 2 diabetes mellitus with foot ulcer: Principal | ICD-10-CM

## 2017-06-28 ENCOUNTER — Encounter: Admit: 2017-06-28 | Discharge: 2017-06-29 | Payer: MEDICAID

## 2017-06-28 ENCOUNTER — Other Ambulatory Visit
Admission: RE | Admit: 2017-06-28 | Discharge: 2017-06-28 | Disposition: A | Discharge status: Home / Self Care | Payer: Medicare Other | Source: Ambulatory Visit | Attending: Nephrology | Admitting: Nephrology

## 2017-06-28 DIAGNOSIS — Z79899 Other long term (current) drug therapy: Secondary | ICD-10-CM

## 2017-06-28 DIAGNOSIS — Z9483 Pancreas transplant status: Secondary | ICD-10-CM

## 2017-06-28 DIAGNOSIS — Z940 Kidney transplant status: Principal | ICD-10-CM

## 2017-06-28 DIAGNOSIS — D899 Disorder involving the immune mechanism, unspecified: Secondary | ICD-10-CM | POA: Diagnosis present

## 2017-06-28 LAB — PHOSPHORUS: PHOSPHORUS: 4.5 mg/dL (ref 2.5–4.6)

## 2017-06-28 LAB — CBC WITH DIFFERENTIAL/PLATELET
BASOS PCT: 1 %
Basophils Absolute: 0 10*3/uL (ref 0–0.1)
EOS ABS: 0.1 10*3/uL (ref 0–0.7)
EOS PCT: 3 %
HCT: 34.5 % — ABNORMAL LOW (ref 35.0–47.0)
Hemoglobin: 11.1 g/dL — ABNORMAL LOW (ref 12.0–16.0)
Lymphocytes Relative: 3 %
Lymphs Abs: 0.2 10*3/uL — ABNORMAL LOW (ref 1.0–3.6)
MCH: 31.8 pg (ref 26.0–34.0)
MCHC: 32.2 g/dL (ref 32.0–36.0)
MCV: 98.5 fL (ref 80.0–100.0)
MONO ABS: 0.9 10*3/uL (ref 0.2–0.9)
Monocytes Relative: 15 %
Neutro Abs: 4.4 10*3/uL (ref 1.4–6.5)
Neutrophils Relative %: 78 %
PLATELETS: 174 10*3/uL (ref 150–440)
RBC: 3.5 MIL/uL — AB (ref 3.80–5.20)
RDW: 14.8 % — AB (ref 11.5–14.5)
WBC: 5.6 10*3/uL (ref 3.6–11.0)

## 2017-06-28 LAB — BASIC METABOLIC PANEL
ANION GAP: 4 — AB (ref 5–15)
BUN: 27 mg/dL — ABNORMAL HIGH (ref 6–20)
CALCIUM: 8.7 mg/dL — AB (ref 8.9–10.3)
CO2: 21 mmol/L — AB (ref 22–32)
Chloride: 112 mmol/L — ABNORMAL HIGH (ref 101–111)
Creatinine, Ser: 1.33 mg/dL — ABNORMAL HIGH (ref 0.44–1.00)
GFR calc non Af Amer: 47 mL/min — ABNORMAL LOW (ref >60)
GFR, EST AFRICAN AMERICAN: 54 mL/min — AB (ref >60)
Glucose, Bld: 103 mg/dL — ABNORMAL HIGH (ref 65–99)
Potassium: 4.7 mmol/L (ref 3.5–5.1)
SODIUM: 137 mmol/L (ref 135–145)

## 2017-06-28 LAB — LIPASE, BLOOD: Lipase: 24 U/L (ref 11–51)

## 2017-06-28 LAB — MAGNESIUM: MAGNESIUM: 1.8 mg/dL (ref 1.7–2.4)

## 2017-06-28 LAB — AMYLASE: AMYLASE: 42 U/L (ref 28–100)

## 2017-07-08 ENCOUNTER — Ambulatory Visit: Admit: 2017-07-08 | Discharge: 2017-07-09 | Payer: MEDICAID | Attending: Podiatrist | Primary: Podiatrist

## 2017-07-08 DIAGNOSIS — L97519 Non-pressure chronic ulcer of other part of right foot with unspecified severity: Secondary | ICD-10-CM

## 2017-07-08 DIAGNOSIS — E11621 Type 2 diabetes mellitus with foot ulcer: Principal | ICD-10-CM

## 2017-07-08 DIAGNOSIS — M14671 Charcot's joint, right ankle and foot: Secondary | ICD-10-CM

## 2017-07-11 ENCOUNTER — Encounter: Admit: 2017-07-11 | Discharge: 2017-07-11 | Payer: MEDICAID | Attending: Podiatrist | Primary: Podiatrist

## 2017-07-11 ENCOUNTER — Encounter: Admit: 2017-07-11 | Discharge: 2017-07-11 | Payer: MEDICAID

## 2017-07-11 DIAGNOSIS — E10621 Type 1 diabetes mellitus with foot ulcer: Principal | ICD-10-CM

## 2017-07-11 DIAGNOSIS — I739 Peripheral vascular disease, unspecified: Secondary | ICD-10-CM

## 2017-07-11 DIAGNOSIS — Z9483 Pancreas transplant status: Secondary | ICD-10-CM

## 2017-07-11 DIAGNOSIS — Z940 Kidney transplant status: Principal | ICD-10-CM

## 2017-07-11 DIAGNOSIS — L97413 Non-pressure chronic ulcer of right heel and midfoot with necrosis of muscle: Secondary | ICD-10-CM

## 2017-07-11 DIAGNOSIS — Z79899 Other long term (current) drug therapy: Secondary | ICD-10-CM

## 2017-07-11 MED ORDER — SODIUM BICARBONATE 650 MG TABLET: tablet | 11 refills | 0 days

## 2017-07-11 MED ORDER — OMEPRAZOLE 40 MG CAPSULE,DELAYED RELEASE: 40 mg | capsule | Freq: Two times a day (BID) | 3 refills | 0 days | Status: AC

## 2017-07-11 MED ORDER — PROGRAF 1 MG CAPSULE
ORAL_CAPSULE | ORAL | 5 refills | 0.00000 days | Status: CP
Start: 2017-07-11 — End: 2017-07-11

## 2017-07-11 MED ORDER — MYCOPHENOLATE SODIUM 180 MG TABLET,DELAYED RELEASE
ORAL_TABLET | Freq: Two times a day (BID) | ORAL | 5 refills | 0 days | Status: CP
Start: 2017-07-11 — End: 2017-07-11

## 2017-07-11 MED ORDER — AMITRIPTYLINE 25 MG TABLET: 25 mg | tablet | Freq: Two times a day (BID) | 5 refills | 0 days | Status: AC

## 2017-07-11 MED ORDER — PROGRAF 1 MG CAPSULE: capsule | 5 refills | 0 days

## 2017-07-11 MED ORDER — PREDNISONE 2.5 MG TABLET: 8 mg | tablet | Freq: Every day | 11 refills | 0 days | Status: AC

## 2017-07-11 MED ORDER — AMITRIPTYLINE 25 MG TABLET
ORAL_TABLET | Freq: Two times a day (BID) | ORAL | 5 refills | 0.00000 days | Status: CP
Start: 2017-07-11 — End: 2017-12-10

## 2017-07-11 MED ORDER — SODIUM BICARBONATE 650 MG TABLET
ORAL_TABLET | Freq: Three times a day (TID) | ORAL | 11 refills | 0.00000 days | Status: CP
Start: 2017-07-11 — End: 2017-08-26

## 2017-07-11 MED ORDER — ALBUTEROL SULFATE HFA 90 MCG/ACTUATION AEROSOL INHALER: g | 0 refills | 0 days

## 2017-07-11 MED ORDER — ATORVASTATIN 40 MG TABLET: 40 mg | tablet | Freq: Every day | 11 refills | 0 days | Status: AC

## 2017-07-11 MED ORDER — ALBUTEROL SULFATE HFA 90 MCG/ACTUATION AEROSOL INHALER
Freq: Four times a day (QID) | RESPIRATORY_TRACT | 0 refills | 0.00000 days | Status: CP | PRN
Start: 2017-07-11 — End: 2017-07-11

## 2017-07-11 MED ORDER — MYFORTIC 180 MG TABLET,DELAYED RELEASE
ORAL_TABLET | ORAL | 5 refills | 0.00000 days
Start: 2017-07-11 — End: 2018-01-27

## 2017-07-11 MED ORDER — SULFAMETHOXAZOLE 400 MG-TRIMETHOPRIM 80 MG TABLET
ORAL_TABLET | ORAL | 2 refills | 0 days
Start: 2017-07-11 — End: 2017-07-11

## 2017-07-11 MED ORDER — MYFORTIC 180 MG TABLET,DELAYED RELEASE: 540 mg | tablet | 5 refills | 0 days

## 2017-07-11 MED ORDER — PREDNISONE 2.5 MG TABLET
ORAL_TABLET | Freq: Every day | ORAL | 11 refills | 0.00000 days | Status: CP
Start: 2017-07-11 — End: 2017-07-11

## 2017-07-11 MED ORDER — ATORVASTATIN 40 MG TABLET
ORAL_TABLET | Freq: Every day | ORAL | 11 refills | 0.00000 days | Status: CP
Start: 2017-07-11 — End: 2017-11-15

## 2017-07-11 MED ORDER — OMEPRAZOLE 40 MG CAPSULE,DELAYED RELEASE
ORAL_CAPSULE | Freq: Two times a day (BID) | ORAL | 3 refills | 0.00000 days | Status: CP
Start: 2017-07-11 — End: 2017-07-11

## 2017-07-11 MED ORDER — FUROSEMIDE 20 MG TABLET
ORAL_TABLET | Freq: Every day | ORAL | 11 refills | 0 days | Status: CP | PRN
Start: 2017-07-11 — End: ?

## 2017-07-12 MED ORDER — SULFAMETHOXAZOLE 400 MG-TRIMETHOPRIM 80 MG TABLET
ORAL_TABLET | ORAL | 2 refills | 0 days | Status: CP
Start: 2017-07-12 — End: 2017-08-12

## 2017-07-12 MED FILL — PROGRAF/1MG/CAP: PROGRAF/1MG/CAP | 30 days supply | Qty: 330 | Fill #0

## 2017-07-12 MED FILL — MYFORTIC/180MG/TAB: MYFORTIC/180MG/TAB | 30 days supply | Qty: 180 | Fill #0

## 2017-07-15 ENCOUNTER — Ambulatory Visit: Admit: 2017-07-15 | Discharge: 2017-07-15 | Payer: MEDICAID | Attending: Vascular Surgery | Primary: Vascular Surgery

## 2017-07-15 ENCOUNTER — Encounter: Admit: 2017-07-15 | Discharge: 2017-07-15 | Payer: MEDICAID | Attending: Podiatrist | Primary: Podiatrist

## 2017-07-15 DIAGNOSIS — E10621 Type 1 diabetes mellitus with foot ulcer: Principal | ICD-10-CM

## 2017-07-15 DIAGNOSIS — L97413 Non-pressure chronic ulcer of right heel and midfoot with necrosis of muscle: Secondary | ICD-10-CM

## 2017-07-15 DIAGNOSIS — M14671 Charcot's joint, right ankle and foot: Secondary | ICD-10-CM

## 2017-07-17 ENCOUNTER — Encounter: Admit: 2017-07-17 | Discharge: 2017-07-18 | Payer: MEDICAID | Attending: Mental Health | Primary: Mental Health

## 2017-07-17 DIAGNOSIS — Z1239 Encounter for other screening for malignant neoplasm of breast: Principal | ICD-10-CM

## 2017-07-17 DIAGNOSIS — Z0000 Encounter for general adult medical examination without abnormal findings: Secondary | ICD-10-CM

## 2017-07-22 ENCOUNTER — Ambulatory Visit: Admit: 2017-07-22 | Discharge: 2017-07-23 | Payer: MEDICAID

## 2017-07-22 ENCOUNTER — Other Ambulatory Visit
Admission: RE | Admit: 2017-07-22 | Discharge: 2017-07-22 | Disposition: A | Discharge status: Home / Self Care | Payer: Medicare Other | Source: Ambulatory Visit | Attending: Nephrology | Admitting: Nephrology

## 2017-07-22 DIAGNOSIS — Z940 Kidney transplant status: Principal | ICD-10-CM

## 2017-07-22 DIAGNOSIS — Z79899 Other long term (current) drug therapy: Secondary | ICD-10-CM

## 2017-07-22 DIAGNOSIS — Z9483 Pancreas transplant status: Secondary | ICD-10-CM

## 2017-07-22 DIAGNOSIS — D631 Anemia in chronic kidney disease: Secondary | ICD-10-CM | POA: Insufficient documentation

## 2017-07-22 DIAGNOSIS — E559 Vitamin D deficiency, unspecified: Secondary | ICD-10-CM | POA: Diagnosis not present

## 2017-07-22 DIAGNOSIS — B259 Cytomegaloviral disease, unspecified: Secondary | ICD-10-CM | POA: Insufficient documentation

## 2017-07-22 DIAGNOSIS — D899 Disorder involving the immune mechanism, unspecified: Secondary | ICD-10-CM | POA: Diagnosis not present

## 2017-07-22 DIAGNOSIS — Z09 Encounter for follow-up examination after completed treatment for conditions other than malignant neoplasm: Secondary | ICD-10-CM | POA: Insufficient documentation

## 2017-07-22 DIAGNOSIS — Z789 Other specified health status: Secondary | ICD-10-CM | POA: Diagnosis not present

## 2017-07-22 LAB — CBC WITH DIFFERENTIAL/PLATELET
Basophils Absolute: 0 10*3/uL (ref 0–0.1)
Basophils Relative: 0 %
EOS ABS: 0.1 10*3/uL (ref 0–0.7)
Eosinophils Relative: 2 %
HCT: 34.2 % — ABNORMAL LOW (ref 35.0–47.0)
HEMOGLOBIN: 11 g/dL — AB (ref 12.0–16.0)
LYMPHS ABS: 0.3 10*3/uL — AB (ref 1.0–3.6)
Lymphocytes Relative: 5 %
MCH: 30.7 pg (ref 26.0–34.0)
MCHC: 32.1 g/dL (ref 32.0–36.0)
MCV: 95.5 fL (ref 80.0–100.0)
MONOS PCT: 14 %
Monocytes Absolute: 0.7 10*3/uL (ref 0.2–0.9)
NEUTROS PCT: 79 %
Neutro Abs: 4 10*3/uL (ref 1.4–6.5)
Platelets: 146 10*3/uL — ABNORMAL LOW (ref 150–440)
RBC: 3.58 MIL/uL — ABNORMAL LOW (ref 3.80–5.20)
RDW: 14.4 % (ref 11.5–14.5)
WBC: 5.1 10*3/uL (ref 3.6–11.0)

## 2017-07-22 LAB — AMYLASE: Amylase: 42 U/L (ref 28–100)

## 2017-07-22 LAB — PHOSPHORUS: Phosphorus: 3.8 mg/dL (ref 2.5–4.6)

## 2017-07-22 LAB — BASIC METABOLIC PANEL
Anion gap: 6 (ref 5–15)
BUN: 32 mg/dL — ABNORMAL HIGH (ref 6–20)
CO2: 22 mmol/L (ref 22–32)
CREATININE: 1.56 mg/dL — AB (ref 0.44–1.00)
Calcium: 8.6 mg/dL — ABNORMAL LOW (ref 8.9–10.3)
Chloride: 109 mmol/L (ref 101–111)
GFR calc non Af Amer: 39 mL/min — ABNORMAL LOW (ref >60)
GFR, EST AFRICAN AMERICAN: 45 mL/min — AB (ref >60)
Glucose, Bld: 94 mg/dL (ref 65–99)
POTASSIUM: 5.2 mmol/L — AB (ref 3.5–5.1)
Sodium: 137 mmol/L (ref 135–145)

## 2017-07-22 LAB — MAGNESIUM: Magnesium: 2.2 mg/dL (ref 1.7–2.4)

## 2017-07-22 LAB — LIPASE, BLOOD: Lipase: 25 U/L (ref 11–51)

## 2017-07-23 ENCOUNTER — Encounter: Admit: 2017-07-23 | Discharge: 2017-07-24 | Payer: MEDICAID | Attending: Podiatrist | Primary: Podiatrist

## 2017-07-23 ENCOUNTER — Encounter: Admit: 2017-07-23 | Discharge: 2017-07-24 | Payer: MEDICAID

## 2017-07-23 DIAGNOSIS — L97413 Non-pressure chronic ulcer of right heel and midfoot with necrosis of muscle: Secondary | ICD-10-CM

## 2017-07-23 DIAGNOSIS — E10621 Type 1 diabetes mellitus with foot ulcer: Principal | ICD-10-CM

## 2017-07-29 ENCOUNTER — Ambulatory Visit: Admit: 2017-07-29 | Discharge: 2017-07-30 | Payer: MEDICAID | Attending: Podiatrist | Primary: Podiatrist

## 2017-07-29 DIAGNOSIS — E10621 Type 1 diabetes mellitus with foot ulcer: Principal | ICD-10-CM

## 2017-07-29 DIAGNOSIS — I739 Peripheral vascular disease, unspecified: Secondary | ICD-10-CM

## 2017-07-29 DIAGNOSIS — L97413 Non-pressure chronic ulcer of right heel and midfoot with necrosis of muscle: Secondary | ICD-10-CM

## 2017-07-29 DIAGNOSIS — M14671 Charcot's joint, right ankle and foot: Secondary | ICD-10-CM

## 2017-08-05 ENCOUNTER — Encounter: Admit: 2017-08-05 | Discharge: 2017-08-05 | Payer: MEDICAID | Attending: Podiatrist | Primary: Podiatrist

## 2017-08-05 DIAGNOSIS — E10621 Type 1 diabetes mellitus with foot ulcer: Principal | ICD-10-CM

## 2017-08-05 DIAGNOSIS — L97413 Non-pressure chronic ulcer of right heel and midfoot with necrosis of muscle: Secondary | ICD-10-CM

## 2017-08-09 ENCOUNTER — Ambulatory Visit: Admit: 2017-08-09 | Discharge: 2017-08-10 | Payer: MEDICAID

## 2017-08-09 DIAGNOSIS — J029 Acute pharyngitis, unspecified: Secondary | ICD-10-CM

## 2017-08-09 DIAGNOSIS — G44209 Tension-type headache, unspecified, not intractable: Principal | ICD-10-CM

## 2017-08-09 DIAGNOSIS — I10 Essential (primary) hypertension: Secondary | ICD-10-CM

## 2017-08-09 DIAGNOSIS — M461 Sacroiliitis, not elsewhere classified: Secondary | ICD-10-CM

## 2017-08-09 DIAGNOSIS — Z940 Kidney transplant status: Secondary | ICD-10-CM

## 2017-08-09 DIAGNOSIS — Z9483 Pancreas transplant status: Secondary | ICD-10-CM

## 2017-08-09 DIAGNOSIS — Z79899 Other long term (current) drug therapy: Secondary | ICD-10-CM

## 2017-08-09 DIAGNOSIS — R51 Headache: Secondary | ICD-10-CM

## 2017-08-09 MED ORDER — ENALAPRIL MALEATE 5 MG TABLET
ORAL_TABLET | Freq: Every day | ORAL | 0 refills | 0 days | Status: CP
Start: 2017-08-09 — End: 2017-09-04

## 2017-08-09 MED ORDER — AMOXICILLIN 500 MG-POTASSIUM CLAVULANATE 125 MG TABLET
ORAL_TABLET | Freq: Two times a day (BID) | ORAL | 0 refills | 0 days | Status: CP
Start: 2017-08-09 — End: 2017-09-13

## 2017-08-09 MED ORDER — CYCLOBENZAPRINE 5 MG TABLET
ORAL_TABLET | Freq: Three times a day (TID) | ORAL | 0 refills | 0.00000 days | Status: CP | PRN
Start: 2017-08-09 — End: 2017-09-08

## 2017-08-12 ENCOUNTER — Encounter: Admit: 2017-08-12 | Discharge: 2017-08-13 | Payer: MEDICAID | Attending: Podiatrist | Primary: Podiatrist

## 2017-08-12 DIAGNOSIS — M14671 Charcot's joint, right ankle and foot: Secondary | ICD-10-CM

## 2017-08-12 DIAGNOSIS — L97413 Non-pressure chronic ulcer of right heel and midfoot with necrosis of muscle: Secondary | ICD-10-CM

## 2017-08-12 DIAGNOSIS — I739 Peripheral vascular disease, unspecified: Secondary | ICD-10-CM

## 2017-08-12 DIAGNOSIS — E10621 Type 1 diabetes mellitus with foot ulcer: Principal | ICD-10-CM

## 2017-08-16 ENCOUNTER — Encounter: Admit: 2017-08-16 | Discharge: 2017-08-17 | Payer: MEDICAID

## 2017-08-16 DIAGNOSIS — Z940 Kidney transplant status: Principal | ICD-10-CM

## 2017-08-16 DIAGNOSIS — Z79899 Other long term (current) drug therapy: Secondary | ICD-10-CM

## 2017-08-16 DIAGNOSIS — Z9483 Pancreas transplant status: Secondary | ICD-10-CM

## 2017-08-16 MED FILL — MYFORTIC/180MG/TAB: MYFORTIC/180MG/TAB | 30 days supply | Qty: 180 | Fill #1

## 2017-08-16 MED FILL — PROGRAF/1MG/CAP: PROGRAF/1MG/CAP | 30 days supply | Qty: 330 | Fill #1

## 2017-08-19 ENCOUNTER — Encounter: Admit: 2017-08-19 | Discharge: 2017-08-20 | Payer: MEDICARE | Attending: Podiatrist | Primary: Podiatrist

## 2017-08-19 DIAGNOSIS — L97413 Non-pressure chronic ulcer of right heel and midfoot with necrosis of muscle: Secondary | ICD-10-CM

## 2017-08-19 DIAGNOSIS — I739 Peripheral vascular disease, unspecified: Secondary | ICD-10-CM

## 2017-08-19 DIAGNOSIS — M14671 Charcot's joint, right ankle and foot: Secondary | ICD-10-CM

## 2017-08-19 DIAGNOSIS — E10621 Type 1 diabetes mellitus with foot ulcer: Principal | ICD-10-CM

## 2017-08-23 ENCOUNTER — Encounter: Admit: 2017-08-23 | Discharge: 2017-08-23 | Payer: MEDICAID

## 2017-08-23 DIAGNOSIS — Z1231 Encounter for screening mammogram for malignant neoplasm of breast: Principal | ICD-10-CM

## 2017-08-26 ENCOUNTER — Encounter: Admit: 2017-08-26 | Discharge: 2017-08-26 | Payer: MEDICAID

## 2017-08-26 ENCOUNTER — Encounter: Admit: 2017-08-26 | Discharge: 2017-08-26 | Payer: MEDICAID | Attending: Podiatrist | Primary: Podiatrist

## 2017-08-26 DIAGNOSIS — Z940 Kidney transplant status: Principal | ICD-10-CM

## 2017-08-26 DIAGNOSIS — Z79899 Other long term (current) drug therapy: Secondary | ICD-10-CM

## 2017-08-26 DIAGNOSIS — I739 Peripheral vascular disease, unspecified: Secondary | ICD-10-CM

## 2017-08-26 DIAGNOSIS — Z9483 Pancreas transplant status: Secondary | ICD-10-CM

## 2017-08-26 DIAGNOSIS — E10621 Type 1 diabetes mellitus with foot ulcer: Principal | ICD-10-CM

## 2017-08-26 DIAGNOSIS — L97413 Non-pressure chronic ulcer of right heel and midfoot with necrosis of muscle: Secondary | ICD-10-CM

## 2017-08-26 DIAGNOSIS — M14671 Charcot's joint, right ankle and foot: Secondary | ICD-10-CM

## 2017-08-26 MED ORDER — SODIUM BICARBONATE 650 MG TABLET
ORAL_TABLET | Freq: Three times a day (TID) | ORAL | 11 refills | 0 days
Start: 2017-08-26 — End: 2018-05-30

## 2017-09-05 MED ORDER — ENALAPRIL MALEATE 5 MG TABLET
ORAL_TABLET | 1 refills | 0 days | Status: CP
Start: 2017-09-05 — End: 2017-09-13

## 2017-09-09 ENCOUNTER — Encounter: Admit: 2017-09-09 | Discharge: 2017-09-10 | Payer: MEDICAID | Attending: Podiatrist | Primary: Podiatrist

## 2017-09-09 DIAGNOSIS — L97413 Non-pressure chronic ulcer of right heel and midfoot with necrosis of muscle: Secondary | ICD-10-CM

## 2017-09-09 DIAGNOSIS — E10621 Type 1 diabetes mellitus with foot ulcer: Principal | ICD-10-CM

## 2017-09-10 ENCOUNTER — Encounter: Admit: 2017-09-10 | Discharge: 2017-09-10 | Payer: MEDICAID

## 2017-09-10 ENCOUNTER — Encounter: Admit: 2017-09-10 | Discharge: 2017-09-10 | Payer: MEDICAID | Attending: Nephrology | Primary: Nephrology

## 2017-09-10 DIAGNOSIS — D899 Disorder involving the immune mechanism, unspecified: Secondary | ICD-10-CM

## 2017-09-10 DIAGNOSIS — Z9483 Pancreas transplant status: Secondary | ICD-10-CM

## 2017-09-10 DIAGNOSIS — I10 Essential (primary) hypertension: Secondary | ICD-10-CM

## 2017-09-10 DIAGNOSIS — Z79899 Other long term (current) drug therapy: Secondary | ICD-10-CM

## 2017-09-10 DIAGNOSIS — Z940 Kidney transplant status: Principal | ICD-10-CM

## 2017-09-10 DIAGNOSIS — E10621 Type 1 diabetes mellitus with foot ulcer: Principal | ICD-10-CM

## 2017-09-10 MED ORDER — FUROSEMIDE 20 MG TABLET
ORAL_TABLET | Freq: Every day | ORAL | 11 refills | 0 days | Status: SS
Start: 2017-09-10 — End: 2017-11-02

## 2017-09-16 ENCOUNTER — Encounter
Admit: 2017-09-16 | Discharge: 2017-09-16 | Disposition: A | Discharge status: Home / Self Care | Payer: MEDICAID | Attending: Podiatrist

## 2017-09-16 ENCOUNTER — Ambulatory Visit
Admit: 2017-09-16 | Discharge: 2017-09-16 | Disposition: A | Discharge status: Home / Self Care | Payer: MEDICAID | Attending: Podiatrist

## 2017-09-16 DIAGNOSIS — L97413 Non-pressure chronic ulcer of right heel and midfoot with necrosis of muscle: Secondary | ICD-10-CM

## 2017-09-16 DIAGNOSIS — L97519 Non-pressure chronic ulcer of other part of right foot with unspecified severity: Principal | ICD-10-CM

## 2017-09-16 DIAGNOSIS — I739 Peripheral vascular disease, unspecified: Secondary | ICD-10-CM

## 2017-09-16 DIAGNOSIS — E10621 Type 1 diabetes mellitus with foot ulcer: Principal | ICD-10-CM

## 2017-09-16 DIAGNOSIS — M14671 Charcot's joint, right ankle and foot: Secondary | ICD-10-CM

## 2017-09-19 MED FILL — PROGRAF/1MG/CAP: PROGRAF/1MG/CAP | 30 days supply | Qty: 330 | Fill #2

## 2017-09-19 MED FILL — MYFORTIC/180MG/TAB: MYFORTIC/180MG/TAB | 30 days supply | Qty: 180 | Fill #2

## 2017-09-23 ENCOUNTER — Encounter: Admit: 2017-09-23 | Discharge: 2017-09-23 | Payer: MEDICAID | Attending: Podiatrist | Primary: Podiatrist

## 2017-09-23 ENCOUNTER — Encounter: Admit: 2017-09-23 | Discharge: 2017-09-23 | Payer: MEDICAID

## 2017-09-23 DIAGNOSIS — E1151 Type 2 diabetes mellitus with diabetic peripheral angiopathy without gangrene: Secondary | ICD-10-CM

## 2017-09-23 DIAGNOSIS — E11621 Type 2 diabetes mellitus with foot ulcer: Principal | ICD-10-CM

## 2017-09-23 DIAGNOSIS — L97519 Non-pressure chronic ulcer of other part of right foot with unspecified severity: Secondary | ICD-10-CM

## 2017-09-23 DIAGNOSIS — L97413 Non-pressure chronic ulcer of right heel and midfoot with necrosis of muscle: Secondary | ICD-10-CM

## 2017-09-23 DIAGNOSIS — E10621 Type 1 diabetes mellitus with foot ulcer: Principal | ICD-10-CM

## 2017-09-23 DIAGNOSIS — I739 Peripheral vascular disease, unspecified: Secondary | ICD-10-CM

## 2017-09-25 MED ORDER — PREDNISONE 2.5 MG TABLET
ORAL_TABLET | Freq: Every day | ORAL | 11 refills | 0.00000 days
Start: 2017-09-25 — End: 2018-06-30

## 2017-09-30 ENCOUNTER — Encounter: Admit: 2017-09-30 | Discharge: 2017-10-01 | Payer: MEDICAID | Attending: Podiatrist | Primary: Podiatrist

## 2017-09-30 DIAGNOSIS — L97413 Non-pressure chronic ulcer of right heel and midfoot with necrosis of muscle: Secondary | ICD-10-CM

## 2017-09-30 DIAGNOSIS — M14671 Charcot's joint, right ankle and foot: Secondary | ICD-10-CM

## 2017-09-30 DIAGNOSIS — Z940 Kidney transplant status: Secondary | ICD-10-CM

## 2017-09-30 DIAGNOSIS — I739 Peripheral vascular disease, unspecified: Secondary | ICD-10-CM

## 2017-09-30 DIAGNOSIS — E10621 Type 1 diabetes mellitus with foot ulcer: Principal | ICD-10-CM

## 2017-10-07 ENCOUNTER — Encounter: Admit: 2017-10-07 | Discharge: 2017-10-08 | Payer: MEDICAID | Attending: Podiatrist | Primary: Podiatrist

## 2017-10-07 DIAGNOSIS — E10621 Type 1 diabetes mellitus with foot ulcer: Principal | ICD-10-CM

## 2017-10-07 DIAGNOSIS — M14671 Charcot's joint, right ankle and foot: Secondary | ICD-10-CM

## 2017-10-07 DIAGNOSIS — L97413 Non-pressure chronic ulcer of right heel and midfoot with necrosis of muscle: Secondary | ICD-10-CM

## 2017-10-15 ENCOUNTER — Encounter: Admit: 2017-10-15 | Discharge: 2017-10-16 | Payer: MEDICAID | Attending: Podiatrist | Primary: Podiatrist

## 2017-10-15 DIAGNOSIS — E10621 Type 1 diabetes mellitus with foot ulcer: Principal | ICD-10-CM

## 2017-10-15 DIAGNOSIS — L97413 Non-pressure chronic ulcer of right heel and midfoot with necrosis of muscle: Secondary | ICD-10-CM

## 2017-10-16 MED ORDER — PROGRAF 1 MG CAPSULE
ORAL_CAPSULE | 5 refills | 0 days | Status: CP
Start: 2017-10-16 — End: 2017-10-22

## 2017-10-21 ENCOUNTER — Non-Acute Institutional Stay: Admit: 2017-10-21 | Discharge: 2017-10-21 | Payer: MEDICAID

## 2017-10-21 ENCOUNTER — Encounter: Admit: 2017-10-21 | Discharge: 2017-10-21 | Payer: MEDICAID | Attending: Podiatrist | Primary: Podiatrist

## 2017-10-21 DIAGNOSIS — E11621 Type 2 diabetes mellitus with foot ulcer: Principal | ICD-10-CM

## 2017-10-21 DIAGNOSIS — Z940 Kidney transplant status: Principal | ICD-10-CM

## 2017-10-21 DIAGNOSIS — Z79899 Other long term (current) drug therapy: Secondary | ICD-10-CM

## 2017-10-21 DIAGNOSIS — Z9483 Pancreas transplant status: Secondary | ICD-10-CM

## 2017-10-21 DIAGNOSIS — L97412 Non-pressure chronic ulcer of right heel and midfoot with fat layer exposed: Secondary | ICD-10-CM

## 2017-10-22 MED ORDER — PROGRAF 1 MG CAPSULE
ORAL_CAPSULE | 5 refills | 0 days
Start: 2017-10-22 — End: 2017-10-30

## 2017-10-29 DIAGNOSIS — D631 Anemia in chronic kidney disease: Secondary | ICD-10-CM

## 2017-10-29 DIAGNOSIS — M14671 Charcot's joint, right ankle and foot: Secondary | ICD-10-CM

## 2017-10-29 DIAGNOSIS — N182 Chronic kidney disease, stage 2 (mild): Principal | ICD-10-CM

## 2017-10-29 DIAGNOSIS — L97412 Non-pressure chronic ulcer of right heel and midfoot with fat layer exposed: Secondary | ICD-10-CM

## 2017-10-29 DIAGNOSIS — M869 Osteomyelitis, unspecified: Principal | ICD-10-CM

## 2017-10-29 DIAGNOSIS — E11621 Type 2 diabetes mellitus with foot ulcer: Secondary | ICD-10-CM

## 2017-10-29 DIAGNOSIS — M25571 Pain in right ankle and joints of right foot: Secondary | ICD-10-CM

## 2017-10-29 DIAGNOSIS — L97519 Non-pressure chronic ulcer of other part of right foot with unspecified severity: Secondary | ICD-10-CM

## 2017-10-29 MED ORDER — OMEPRAZOLE 40 MG CAPSULE,DELAYED RELEASE
11 refills | 0 days | Status: CP
Start: 2017-10-29 — End: ?

## 2017-10-29 MED FILL — MYFORTIC 180 MG TABLET,DELAYED RELEASE: ORAL | 30 days supply | Qty: 180 | Fill #0

## 2017-10-29 MED FILL — MYFORTIC 180 MG TABLET,DELAYED RELEASE: 30 days supply | Qty: 180 | Fill #0 | Status: AC

## 2017-10-30 MED ORDER — PROGRAF 1 MG CAPSULE
ORAL_CAPSULE | Freq: Two times a day (BID) | ORAL | 5 refills | 0.00000 days | Status: CP
Start: 2017-10-30 — End: 2017-11-15
  Filled 2017-10-31: qty 240, 30d supply, fill #0

## 2017-10-31 MED FILL — PROGRAF 1 MG CAPSULE: 30 days supply | Qty: 240 | Fill #0 | Status: AC

## 2017-11-01 ENCOUNTER — Ambulatory Visit
Admit: 2017-11-01 | Discharge: 2017-11-05 | Disposition: A | Discharge status: Home Health | Payer: MEDICAID | Attending: Podiatrist | Admitting: Family Medicine

## 2017-11-01 ENCOUNTER — Ambulatory Visit
Admit: 2017-11-01 | Discharge: 2017-11-05 | Disposition: A | Discharge status: Home Health | Payer: MEDICAID | Admitting: Family Medicine

## 2017-11-02 DIAGNOSIS — M869 Osteomyelitis, unspecified: Principal | ICD-10-CM

## 2017-11-03 DIAGNOSIS — M869 Osteomyelitis, unspecified: Principal | ICD-10-CM

## 2017-11-04 MED ORDER — CEFEPIME 2 GRAM/100 ML IN DEXTROSE (ISO-OSMOTIC) INTRAVENOUS PIGGYBACK
Freq: Two times a day (BID) | INTRAVENOUS | 0 refills | 0 days
Start: 2017-11-04 — End: 2017-11-05

## 2017-11-04 MED ORDER — DAPTOMYCIN (CUBICIN) IVPB IN 50 ML
INTRAVENOUS | 0 refills | 0 days
Start: 2017-11-04 — End: 2017-11-05

## 2017-11-05 DIAGNOSIS — K219 Gastro-esophageal reflux disease without esophagitis: Secondary | ICD-10-CM

## 2017-11-05 DIAGNOSIS — N183 Chronic kidney disease, stage 3 (moderate): Secondary | ICD-10-CM

## 2017-11-05 DIAGNOSIS — E669 Obesity, unspecified: Secondary | ICD-10-CM

## 2017-11-05 DIAGNOSIS — E1151 Type 2 diabetes mellitus with diabetic peripheral angiopathy without gangrene: Secondary | ICD-10-CM

## 2017-11-05 DIAGNOSIS — D631 Anemia in chronic kidney disease: Secondary | ICD-10-CM

## 2017-11-05 DIAGNOSIS — E1169 Type 2 diabetes mellitus with other specified complication: Secondary | ICD-10-CM

## 2017-11-05 DIAGNOSIS — M869 Osteomyelitis, unspecified: Secondary | ICD-10-CM

## 2017-11-05 DIAGNOSIS — E1142 Type 2 diabetes mellitus with diabetic polyneuropathy: Secondary | ICD-10-CM

## 2017-11-05 DIAGNOSIS — E11621 Type 2 diabetes mellitus with foot ulcer: Principal | ICD-10-CM

## 2017-11-05 DIAGNOSIS — K224 Dyskinesia of esophagus: Secondary | ICD-10-CM

## 2017-11-05 DIAGNOSIS — E1143 Type 2 diabetes mellitus with diabetic autonomic (poly)neuropathy: Secondary | ICD-10-CM

## 2017-11-05 DIAGNOSIS — M1611 Unilateral primary osteoarthritis, right hip: Secondary | ICD-10-CM

## 2017-11-05 DIAGNOSIS — R1310 Dysphagia, unspecified: Secondary | ICD-10-CM

## 2017-11-05 DIAGNOSIS — Z792 Long term (current) use of antibiotics: Secondary | ICD-10-CM

## 2017-11-05 DIAGNOSIS — E11610 Type 2 diabetes mellitus with diabetic neuropathic arthropathy: Secondary | ICD-10-CM

## 2017-11-05 DIAGNOSIS — L97412 Non-pressure chronic ulcer of right heel and midfoot with fat layer exposed: Secondary | ICD-10-CM

## 2017-11-05 DIAGNOSIS — I129 Hypertensive chronic kidney disease with stage 1 through stage 4 chronic kidney disease, or unspecified chronic kidney disease: Secondary | ICD-10-CM

## 2017-11-05 DIAGNOSIS — E785 Hyperlipidemia, unspecified: Secondary | ICD-10-CM

## 2017-11-05 DIAGNOSIS — M8580 Other specified disorders of bone density and structure, unspecified site: Secondary | ICD-10-CM

## 2017-11-05 DIAGNOSIS — K3184 Gastroparesis: Secondary | ICD-10-CM

## 2017-11-05 DIAGNOSIS — E1122 Type 2 diabetes mellitus with diabetic chronic kidney disease: Secondary | ICD-10-CM

## 2017-11-05 DIAGNOSIS — E11319 Type 2 diabetes mellitus with unspecified diabetic retinopathy without macular edema: Secondary | ICD-10-CM

## 2017-11-05 DIAGNOSIS — M19012 Primary osteoarthritis, left shoulder: Secondary | ICD-10-CM

## 2017-11-05 DIAGNOSIS — E875 Hyperkalemia: Secondary | ICD-10-CM

## 2017-11-05 DIAGNOSIS — Z89421 Acquired absence of other right toe(s): Secondary | ICD-10-CM

## 2017-11-05 MED ORDER — METRONIDAZOLE 500 MG TABLET: 500 mg | tablet | Freq: Three times a day (TID) | 0 refills | 0 days | Status: AC

## 2017-11-05 MED ORDER — CEFEPIME 2 GRAM/100 ML IN DEXTROSE (ISO-OSMOTIC) INTRAVENOUS PIGGYBACK
Freq: Two times a day (BID) | INTRAVENOUS | 0 refills | 0.00000 days | Status: CP
Start: 2017-11-05 — End: 2017-11-15

## 2017-11-05 MED ORDER — METRONIDAZOLE 500 MG TABLET
ORAL_TABLET | Freq: Three times a day (TID) | ORAL | 0 refills | 0.00000 days | Status: CP
Start: 2017-11-05 — End: 2017-11-05

## 2017-11-05 MED ORDER — DAPTOMYCIN (CUBICIN) IVPB IN 50 ML: 8 mg/kg | mL | 0 refills | 0 days | Status: AC

## 2017-11-05 MED ORDER — DAPTOMYCIN (CUBICIN) IVPB IN 50 ML
INTRAVENOUS | 0 refills | 0.00000 days | Status: CP
Start: 2017-11-05 — End: 2017-11-05

## 2017-11-05 MED ORDER — CEFEPIME 2 GRAM/100 ML IN DEXTROSE (ISO-OSMOTIC) INTRAVENOUS PIGGYBACK: 2 g | mL | Freq: Two times a day (BID) | 0 refills | 0 days | Status: AC

## 2017-11-05 MED ORDER — CARVEDILOL 12.5 MG TABLET
ORAL_TABLET | Freq: Two times a day (BID) | ORAL | 0 refills | 0.00000 days | Status: CP
Start: 2017-11-05 — End: 2017-11-15

## 2017-11-05 MED ORDER — POLYETHYLENE GLYCOL 3350 17 GRAM ORAL POWDER PACKET
Freq: Every day | ORAL | 0 refills | 0 days | Status: CP | PRN
Start: 2017-11-05 — End: 2017-12-10

## 2017-11-05 MED ORDER — DAPTOMYCIN (CUBICIN) IVPB IN 50 ML: 6 mg/kg | mL | 0 refills | 0 days | Status: AC

## 2017-11-07 ENCOUNTER — Ambulatory Visit: Admit: 2017-11-07 | Discharge: 2017-11-15 | Disposition: A | Discharge status: Home Health | Payer: MEDICAID

## 2017-11-07 ENCOUNTER — Encounter: Admit: 2017-11-07 | Discharge: 2017-12-17 | Payer: MEDICAID

## 2017-11-07 DIAGNOSIS — E11621 Type 2 diabetes mellitus with foot ulcer: Principal | ICD-10-CM

## 2017-11-07 DIAGNOSIS — E1143 Type 2 diabetes mellitus with diabetic autonomic (poly)neuropathy: Secondary | ICD-10-CM

## 2017-11-07 DIAGNOSIS — K3184 Gastroparesis: Secondary | ICD-10-CM

## 2017-11-07 DIAGNOSIS — Z792 Long term (current) use of antibiotics: Secondary | ICD-10-CM

## 2017-11-07 DIAGNOSIS — E1169 Type 2 diabetes mellitus with other specified complication: Secondary | ICD-10-CM

## 2017-11-07 DIAGNOSIS — E875 Hyperkalemia: Secondary | ICD-10-CM

## 2017-11-07 DIAGNOSIS — L97412 Non-pressure chronic ulcer of right heel and midfoot with fat layer exposed: Secondary | ICD-10-CM

## 2017-11-07 DIAGNOSIS — K219 Gastro-esophageal reflux disease without esophagitis: Secondary | ICD-10-CM

## 2017-11-07 DIAGNOSIS — E785 Hyperlipidemia, unspecified: Secondary | ICD-10-CM

## 2017-11-07 DIAGNOSIS — M869 Osteomyelitis, unspecified: Secondary | ICD-10-CM

## 2017-11-07 DIAGNOSIS — M8580 Other specified disorders of bone density and structure, unspecified site: Secondary | ICD-10-CM

## 2017-11-07 DIAGNOSIS — E1151 Type 2 diabetes mellitus with diabetic peripheral angiopathy without gangrene: Secondary | ICD-10-CM

## 2017-11-07 DIAGNOSIS — E669 Obesity, unspecified: Secondary | ICD-10-CM

## 2017-11-07 DIAGNOSIS — E11319 Type 2 diabetes mellitus with unspecified diabetic retinopathy without macular edema: Secondary | ICD-10-CM

## 2017-11-07 DIAGNOSIS — D631 Anemia in chronic kidney disease: Secondary | ICD-10-CM

## 2017-11-07 DIAGNOSIS — E1122 Type 2 diabetes mellitus with diabetic chronic kidney disease: Secondary | ICD-10-CM

## 2017-11-07 DIAGNOSIS — E11610 Type 2 diabetes mellitus with diabetic neuropathic arthropathy: Secondary | ICD-10-CM

## 2017-11-07 DIAGNOSIS — K224 Dyskinesia of esophagus: Secondary | ICD-10-CM

## 2017-11-07 DIAGNOSIS — N183 Chronic kidney disease, stage 3 (moderate): Secondary | ICD-10-CM

## 2017-11-07 DIAGNOSIS — R1310 Dysphagia, unspecified: Secondary | ICD-10-CM

## 2017-11-07 DIAGNOSIS — E1142 Type 2 diabetes mellitus with diabetic polyneuropathy: Secondary | ICD-10-CM

## 2017-11-07 DIAGNOSIS — M19012 Primary osteoarthritis, left shoulder: Secondary | ICD-10-CM

## 2017-11-07 DIAGNOSIS — Z89421 Acquired absence of other right toe(s): Secondary | ICD-10-CM

## 2017-11-07 DIAGNOSIS — M1611 Unilateral primary osteoarthritis, right hip: Secondary | ICD-10-CM

## 2017-11-07 DIAGNOSIS — I129 Hypertensive chronic kidney disease with stage 1 through stage 4 chronic kidney disease, or unspecified chronic kidney disease: Secondary | ICD-10-CM

## 2017-11-08 DIAGNOSIS — D631 Anemia in chronic kidney disease: Secondary | ICD-10-CM

## 2017-11-08 DIAGNOSIS — E875 Hyperkalemia: Principal | ICD-10-CM

## 2017-11-08 DIAGNOSIS — R1310 Dysphagia, unspecified: Secondary | ICD-10-CM

## 2017-11-08 DIAGNOSIS — E785 Hyperlipidemia, unspecified: Secondary | ICD-10-CM

## 2017-11-08 DIAGNOSIS — L97412 Non-pressure chronic ulcer of right heel and midfoot with fat layer exposed: Secondary | ICD-10-CM

## 2017-11-08 DIAGNOSIS — K3184 Gastroparesis: Secondary | ICD-10-CM

## 2017-11-08 DIAGNOSIS — E1143 Type 2 diabetes mellitus with diabetic autonomic (poly)neuropathy: Secondary | ICD-10-CM

## 2017-11-08 DIAGNOSIS — E11610 Type 2 diabetes mellitus with diabetic neuropathic arthropathy: Secondary | ICD-10-CM

## 2017-11-08 DIAGNOSIS — E11621 Type 2 diabetes mellitus with foot ulcer: Principal | ICD-10-CM

## 2017-11-08 DIAGNOSIS — M19012 Primary osteoarthritis, left shoulder: Secondary | ICD-10-CM

## 2017-11-08 DIAGNOSIS — E1142 Type 2 diabetes mellitus with diabetic polyneuropathy: Secondary | ICD-10-CM

## 2017-11-08 DIAGNOSIS — M8580 Other specified disorders of bone density and structure, unspecified site: Secondary | ICD-10-CM

## 2017-11-08 DIAGNOSIS — Z89421 Acquired absence of other right toe(s): Secondary | ICD-10-CM

## 2017-11-08 DIAGNOSIS — M869 Osteomyelitis, unspecified: Secondary | ICD-10-CM

## 2017-11-08 DIAGNOSIS — M1611 Unilateral primary osteoarthritis, right hip: Secondary | ICD-10-CM

## 2017-11-08 DIAGNOSIS — K219 Gastro-esophageal reflux disease without esophagitis: Secondary | ICD-10-CM

## 2017-11-08 DIAGNOSIS — E669 Obesity, unspecified: Secondary | ICD-10-CM

## 2017-11-08 DIAGNOSIS — E1169 Type 2 diabetes mellitus with other specified complication: Secondary | ICD-10-CM

## 2017-11-08 DIAGNOSIS — K224 Dyskinesia of esophagus: Secondary | ICD-10-CM

## 2017-11-08 DIAGNOSIS — E11319 Type 2 diabetes mellitus with unspecified diabetic retinopathy without macular edema: Secondary | ICD-10-CM

## 2017-11-08 DIAGNOSIS — N183 Chronic kidney disease, stage 3 (moderate): Secondary | ICD-10-CM

## 2017-11-08 DIAGNOSIS — E1151 Type 2 diabetes mellitus with diabetic peripheral angiopathy without gangrene: Secondary | ICD-10-CM

## 2017-11-08 DIAGNOSIS — E1122 Type 2 diabetes mellitus with diabetic chronic kidney disease: Secondary | ICD-10-CM

## 2017-11-08 DIAGNOSIS — Z792 Long term (current) use of antibiotics: Secondary | ICD-10-CM

## 2017-11-08 DIAGNOSIS — I129 Hypertensive chronic kidney disease with stage 1 through stage 4 chronic kidney disease, or unspecified chronic kidney disease: Secondary | ICD-10-CM

## 2017-11-11 DIAGNOSIS — E875 Hyperkalemia: Principal | ICD-10-CM

## 2017-11-15 MED ORDER — LINEZOLID 600 MG TABLET
ORAL_TABLET | Freq: Two times a day (BID) | ORAL | 0 refills | 0.00000 days | Status: CP
Start: 2017-11-15 — End: 2017-11-26
  Filled 2017-11-15: qty 30, 15d supply, fill #0

## 2017-11-15 MED ORDER — TACROLIMUS 1 MG CAPSULE
ORAL_CAPSULE | Freq: Every day | ORAL | 0 refills | 0 days | Status: CP
Start: 2017-11-15 — End: 2017-12-10

## 2017-11-15 MED ORDER — CARVEDILOL 25 MG TABLET
ORAL_TABLET | Freq: Two times a day (BID) | ORAL | 0 refills | 0 days | Status: CP
Start: 2017-11-15 — End: 2018-01-06

## 2017-11-15 MED ORDER — CEFTAZIDIME (FORTAZ) 2 G MINI-BAG PLUS
Freq: Two times a day (BID) | INTRAVENOUS | 0 refills | 0 days | Status: CP
Start: 2017-11-15 — End: 2017-11-30

## 2017-11-15 MED ORDER — LINEZOLID 600 MG TABLET: 600 mg | tablet | Freq: Two times a day (BID) | 0 refills | 0 days

## 2017-11-15 MED FILL — LINEZOLID 600 MG TABLET: 15 days supply | Qty: 30 | Fill #0 | Status: AC

## 2017-11-16 DIAGNOSIS — E11610 Type 2 diabetes mellitus with diabetic neuropathic arthropathy: Secondary | ICD-10-CM

## 2017-11-16 DIAGNOSIS — I129 Hypertensive chronic kidney disease with stage 1 through stage 4 chronic kidney disease, or unspecified chronic kidney disease: Secondary | ICD-10-CM

## 2017-11-16 DIAGNOSIS — E11621 Type 2 diabetes mellitus with foot ulcer: Principal | ICD-10-CM

## 2017-11-16 DIAGNOSIS — Z792 Long term (current) use of antibiotics: Secondary | ICD-10-CM

## 2017-11-16 DIAGNOSIS — M8580 Other specified disorders of bone density and structure, unspecified site: Secondary | ICD-10-CM

## 2017-11-16 DIAGNOSIS — D631 Anemia in chronic kidney disease: Secondary | ICD-10-CM

## 2017-11-16 DIAGNOSIS — N183 Chronic kidney disease, stage 3 (moderate): Secondary | ICD-10-CM

## 2017-11-16 DIAGNOSIS — L97412 Non-pressure chronic ulcer of right heel and midfoot with fat layer exposed: Secondary | ICD-10-CM

## 2017-11-16 DIAGNOSIS — E1142 Type 2 diabetes mellitus with diabetic polyneuropathy: Secondary | ICD-10-CM

## 2017-11-16 DIAGNOSIS — E1122 Type 2 diabetes mellitus with diabetic chronic kidney disease: Secondary | ICD-10-CM

## 2017-11-16 DIAGNOSIS — K224 Dyskinesia of esophagus: Secondary | ICD-10-CM

## 2017-11-16 DIAGNOSIS — Z89421 Acquired absence of other right toe(s): Secondary | ICD-10-CM

## 2017-11-16 DIAGNOSIS — E1151 Type 2 diabetes mellitus with diabetic peripheral angiopathy without gangrene: Secondary | ICD-10-CM

## 2017-11-16 DIAGNOSIS — E1143 Type 2 diabetes mellitus with diabetic autonomic (poly)neuropathy: Secondary | ICD-10-CM

## 2017-11-16 DIAGNOSIS — K3184 Gastroparesis: Secondary | ICD-10-CM

## 2017-11-16 DIAGNOSIS — E785 Hyperlipidemia, unspecified: Secondary | ICD-10-CM

## 2017-11-16 DIAGNOSIS — E669 Obesity, unspecified: Secondary | ICD-10-CM

## 2017-11-16 DIAGNOSIS — M1611 Unilateral primary osteoarthritis, right hip: Secondary | ICD-10-CM

## 2017-11-16 DIAGNOSIS — M19012 Primary osteoarthritis, left shoulder: Secondary | ICD-10-CM

## 2017-11-16 DIAGNOSIS — E875 Hyperkalemia: Secondary | ICD-10-CM

## 2017-11-16 DIAGNOSIS — E11319 Type 2 diabetes mellitus with unspecified diabetic retinopathy without macular edema: Secondary | ICD-10-CM

## 2017-11-16 DIAGNOSIS — M869 Osteomyelitis, unspecified: Secondary | ICD-10-CM

## 2017-11-16 DIAGNOSIS — R1310 Dysphagia, unspecified: Secondary | ICD-10-CM

## 2017-11-16 DIAGNOSIS — K219 Gastro-esophageal reflux disease without esophagitis: Secondary | ICD-10-CM

## 2017-11-16 DIAGNOSIS — E1169 Type 2 diabetes mellitus with other specified complication: Secondary | ICD-10-CM

## 2017-11-16 MED ORDER — AMLODIPINE 5 MG TABLET
ORAL_TABLET | Freq: Every day | ORAL | 0 refills | 0.00000 days | Status: CP
Start: 2017-11-16 — End: 2017-12-10

## 2017-11-16 MED ORDER — TACROLIMUS 1 MG CAPSULE
ORAL_CAPSULE | Freq: Every day | ORAL | 0 refills | 0.00000 days | Status: CP
Start: 2017-11-16 — End: 2017-12-10

## 2017-11-19 ENCOUNTER — Other Ambulatory Visit: Admit: 2017-11-19 | Discharge: 2017-11-20 | Payer: MEDICAID

## 2017-11-19 DIAGNOSIS — K219 Gastro-esophageal reflux disease without esophagitis: Secondary | ICD-10-CM

## 2017-11-19 DIAGNOSIS — E669 Obesity, unspecified: Secondary | ICD-10-CM

## 2017-11-19 DIAGNOSIS — M19012 Primary osteoarthritis, left shoulder: Secondary | ICD-10-CM

## 2017-11-19 DIAGNOSIS — Z89421 Acquired absence of other right toe(s): Secondary | ICD-10-CM

## 2017-11-19 DIAGNOSIS — K224 Dyskinesia of esophagus: Secondary | ICD-10-CM

## 2017-11-19 DIAGNOSIS — E11610 Type 2 diabetes mellitus with diabetic neuropathic arthropathy: Secondary | ICD-10-CM

## 2017-11-19 DIAGNOSIS — E11621 Type 2 diabetes mellitus with foot ulcer: Principal | ICD-10-CM

## 2017-11-19 DIAGNOSIS — E1142 Type 2 diabetes mellitus with diabetic polyneuropathy: Secondary | ICD-10-CM

## 2017-11-19 DIAGNOSIS — Z792 Long term (current) use of antibiotics: Secondary | ICD-10-CM

## 2017-11-19 DIAGNOSIS — E11319 Type 2 diabetes mellitus with unspecified diabetic retinopathy without macular edema: Secondary | ICD-10-CM

## 2017-11-19 DIAGNOSIS — D631 Anemia in chronic kidney disease: Secondary | ICD-10-CM

## 2017-11-19 DIAGNOSIS — E1151 Type 2 diabetes mellitus with diabetic peripheral angiopathy without gangrene: Secondary | ICD-10-CM

## 2017-11-19 DIAGNOSIS — N183 Chronic kidney disease, stage 3 (moderate): Principal | ICD-10-CM

## 2017-11-19 DIAGNOSIS — E875 Hyperkalemia: Secondary | ICD-10-CM

## 2017-11-19 DIAGNOSIS — I129 Hypertensive chronic kidney disease with stage 1 through stage 4 chronic kidney disease, or unspecified chronic kidney disease: Secondary | ICD-10-CM

## 2017-11-19 DIAGNOSIS — E1143 Type 2 diabetes mellitus with diabetic autonomic (poly)neuropathy: Secondary | ICD-10-CM

## 2017-11-19 DIAGNOSIS — M1611 Unilateral primary osteoarthritis, right hip: Secondary | ICD-10-CM

## 2017-11-19 DIAGNOSIS — K3184 Gastroparesis: Secondary | ICD-10-CM

## 2017-11-19 DIAGNOSIS — Z940 Kidney transplant status: Secondary | ICD-10-CM

## 2017-11-19 DIAGNOSIS — M8580 Other specified disorders of bone density and structure, unspecified site: Secondary | ICD-10-CM

## 2017-11-19 DIAGNOSIS — R1310 Dysphagia, unspecified: Secondary | ICD-10-CM

## 2017-11-19 DIAGNOSIS — E1122 Type 2 diabetes mellitus with diabetic chronic kidney disease: Secondary | ICD-10-CM

## 2017-11-19 DIAGNOSIS — E1169 Type 2 diabetes mellitus with other specified complication: Secondary | ICD-10-CM

## 2017-11-19 DIAGNOSIS — M869 Osteomyelitis, unspecified: Secondary | ICD-10-CM

## 2017-11-19 DIAGNOSIS — E785 Hyperlipidemia, unspecified: Secondary | ICD-10-CM

## 2017-11-19 DIAGNOSIS — L97412 Non-pressure chronic ulcer of right heel and midfoot with fat layer exposed: Secondary | ICD-10-CM

## 2017-11-25 MED ORDER — PROGRAF 1 MG CAPSULE
ORAL_CAPSULE | Freq: Two times a day (BID) | ORAL | 5 refills | 0 days | Status: CP
Start: 2017-11-25 — End: 2017-12-23
  Filled 2017-12-02: qty 240, 30d supply, fill #0

## 2017-11-26 ENCOUNTER — Encounter: Admit: 2017-11-26 | Discharge: 2017-11-27 | Payer: MEDICAID

## 2017-11-26 DIAGNOSIS — N183 Chronic kidney disease, stage 3 (moderate): Secondary | ICD-10-CM

## 2017-11-26 DIAGNOSIS — E11610 Type 2 diabetes mellitus with diabetic neuropathic arthropathy: Secondary | ICD-10-CM

## 2017-11-26 DIAGNOSIS — E785 Hyperlipidemia, unspecified: Secondary | ICD-10-CM

## 2017-11-26 DIAGNOSIS — E1151 Type 2 diabetes mellitus with diabetic peripheral angiopathy without gangrene: Secondary | ICD-10-CM

## 2017-11-26 DIAGNOSIS — M19012 Primary osteoarthritis, left shoulder: Secondary | ICD-10-CM

## 2017-11-26 DIAGNOSIS — E1143 Type 2 diabetes mellitus with diabetic autonomic (poly)neuropathy: Secondary | ICD-10-CM

## 2017-11-26 DIAGNOSIS — D631 Anemia in chronic kidney disease: Secondary | ICD-10-CM

## 2017-11-26 DIAGNOSIS — E1142 Type 2 diabetes mellitus with diabetic polyneuropathy: Secondary | ICD-10-CM

## 2017-11-26 DIAGNOSIS — E669 Obesity, unspecified: Secondary | ICD-10-CM

## 2017-11-26 DIAGNOSIS — E1169 Type 2 diabetes mellitus with other specified complication: Principal | ICD-10-CM

## 2017-11-26 DIAGNOSIS — Z89421 Acquired absence of other right toe(s): Secondary | ICD-10-CM

## 2017-11-26 DIAGNOSIS — Z940 Kidney transplant status: Secondary | ICD-10-CM

## 2017-11-26 DIAGNOSIS — K224 Dyskinesia of esophagus: Secondary | ICD-10-CM

## 2017-11-26 DIAGNOSIS — M869 Osteomyelitis, unspecified: Secondary | ICD-10-CM

## 2017-11-26 DIAGNOSIS — L97412 Non-pressure chronic ulcer of right heel and midfoot with fat layer exposed: Secondary | ICD-10-CM

## 2017-11-26 DIAGNOSIS — E11621 Type 2 diabetes mellitus with foot ulcer: Principal | ICD-10-CM

## 2017-11-26 DIAGNOSIS — I129 Hypertensive chronic kidney disease with stage 1 through stage 4 chronic kidney disease, or unspecified chronic kidney disease: Secondary | ICD-10-CM

## 2017-11-26 DIAGNOSIS — E875 Hyperkalemia: Secondary | ICD-10-CM

## 2017-11-26 DIAGNOSIS — R1310 Dysphagia, unspecified: Secondary | ICD-10-CM

## 2017-11-26 DIAGNOSIS — E1122 Type 2 diabetes mellitus with diabetic chronic kidney disease: Secondary | ICD-10-CM

## 2017-11-26 DIAGNOSIS — K219 Gastro-esophageal reflux disease without esophagitis: Secondary | ICD-10-CM

## 2017-11-26 DIAGNOSIS — Z792 Long term (current) use of antibiotics: Secondary | ICD-10-CM

## 2017-11-26 DIAGNOSIS — M1611 Unilateral primary osteoarthritis, right hip: Secondary | ICD-10-CM

## 2017-11-26 DIAGNOSIS — E11319 Type 2 diabetes mellitus with unspecified diabetic retinopathy without macular edema: Secondary | ICD-10-CM

## 2017-11-26 DIAGNOSIS — Z9483 Pancreas transplant status: Secondary | ICD-10-CM

## 2017-11-26 DIAGNOSIS — M8580 Other specified disorders of bone density and structure, unspecified site: Secondary | ICD-10-CM

## 2017-11-26 DIAGNOSIS — K3184 Gastroparesis: Secondary | ICD-10-CM

## 2017-11-26 MED ORDER — DOXYCYCLINE HYCLATE 100 MG TABLET
ORAL_TABLET | Freq: Two times a day (BID) | ORAL | 2 refills | 0 days | Status: CP
Start: 2017-11-26 — End: 2018-03-19

## 2017-11-28 ENCOUNTER — Encounter: Admit: 2017-11-28 | Discharge: 2017-11-28 | Payer: MEDICAID | Attending: Podiatrist | Primary: Podiatrist

## 2017-11-28 DIAGNOSIS — E11621 Type 2 diabetes mellitus with foot ulcer: Principal | ICD-10-CM

## 2017-11-28 DIAGNOSIS — M86671 Other chronic osteomyelitis, right ankle and foot: Secondary | ICD-10-CM

## 2017-11-28 DIAGNOSIS — L97411 Non-pressure chronic ulcer of right heel and midfoot limited to breakdown of skin: Secondary | ICD-10-CM

## 2017-12-01 ENCOUNTER — Emergency Department: Payer: Medicare Other

## 2017-12-01 ENCOUNTER — Observation Stay
Admission: EM | Admit: 2017-12-01 | Discharge: 2017-12-02 | Disposition: A | Discharge status: Home / Self Care | Payer: Medicare Other | Attending: Specialist | Admitting: Specialist

## 2017-12-01 DIAGNOSIS — K219 Gastro-esophageal reflux disease without esophagitis: Secondary | ICD-10-CM | POA: Diagnosis present

## 2017-12-01 DIAGNOSIS — I4510 Unspecified right bundle-branch block: Secondary | ICD-10-CM | POA: Insufficient documentation

## 2017-12-01 DIAGNOSIS — E785 Hyperlipidemia, unspecified: Secondary | ICD-10-CM | POA: Diagnosis not present

## 2017-12-01 DIAGNOSIS — Z833 Family history of diabetes mellitus: Secondary | ICD-10-CM | POA: Diagnosis not present

## 2017-12-01 DIAGNOSIS — I252 Old myocardial infarction: Secondary | ICD-10-CM | POA: Insufficient documentation

## 2017-12-01 DIAGNOSIS — N183 Chronic kidney disease, stage 3 unspecified: Secondary | ICD-10-CM | POA: Diagnosis present

## 2017-12-01 DIAGNOSIS — I10 Essential (primary) hypertension: Secondary | ICD-10-CM | POA: Diagnosis present

## 2017-12-01 DIAGNOSIS — Z79899 Other long term (current) drug therapy: Secondary | ICD-10-CM | POA: Insufficient documentation

## 2017-12-01 DIAGNOSIS — E1122 Type 2 diabetes mellitus with diabetic chronic kidney disease: Secondary | ICD-10-CM | POA: Diagnosis not present

## 2017-12-01 DIAGNOSIS — Z9483 Pancreas transplant status: Secondary | ICD-10-CM | POA: Insufficient documentation

## 2017-12-01 DIAGNOSIS — R079 Chest pain, unspecified: Secondary | ICD-10-CM

## 2017-12-01 DIAGNOSIS — Z794 Long term (current) use of insulin: Secondary | ICD-10-CM | POA: Diagnosis not present

## 2017-12-01 DIAGNOSIS — M869 Osteomyelitis, unspecified: Secondary | ICD-10-CM | POA: Diagnosis not present

## 2017-12-01 DIAGNOSIS — Z792 Long term (current) use of antibiotics: Secondary | ICD-10-CM | POA: Diagnosis not present

## 2017-12-01 DIAGNOSIS — Z89429 Acquired absence of other toe(s), unspecified side: Secondary | ICD-10-CM | POA: Diagnosis not present

## 2017-12-01 DIAGNOSIS — I2510 Atherosclerotic heart disease of native coronary artery without angina pectoris: Secondary | ICD-10-CM | POA: Diagnosis not present

## 2017-12-01 DIAGNOSIS — R0789 Other chest pain: Secondary | ICD-10-CM | POA: Diagnosis not present

## 2017-12-01 DIAGNOSIS — I129 Hypertensive chronic kidney disease with stage 1 through stage 4 chronic kidney disease, or unspecified chronic kidney disease: Secondary | ICD-10-CM | POA: Diagnosis not present

## 2017-12-01 DIAGNOSIS — Z809 Family history of malignant neoplasm, unspecified: Secondary | ICD-10-CM | POA: Diagnosis not present

## 2017-12-01 DIAGNOSIS — Z7982 Long term (current) use of aspirin: Secondary | ICD-10-CM | POA: Insufficient documentation

## 2017-12-01 DIAGNOSIS — Z940 Kidney transplant status: Secondary | ICD-10-CM

## 2017-12-01 DIAGNOSIS — Z885 Allergy status to narcotic agent status: Secondary | ICD-10-CM | POA: Insufficient documentation

## 2017-12-01 DIAGNOSIS — E875 Hyperkalemia: Secondary | ICD-10-CM | POA: Diagnosis not present

## 2017-12-01 DIAGNOSIS — E119 Type 2 diabetes mellitus without complications: Secondary | ICD-10-CM

## 2017-12-01 HISTORY — DX: Acute myocardial infarction, unspecified: I21.9

## 2017-12-01 LAB — CBC
HCT: 25 % — ABNORMAL LOW (ref 35.0–47.0)
HEMOGLOBIN: 7.8 g/dL — AB (ref 12.0–16.0)
MCH: 28.5 pg (ref 26.0–34.0)
MCHC: 31 g/dL — AB (ref 32.0–36.0)
MCV: 91.9 fL (ref 80.0–100.0)
PLATELETS: 111 10*3/uL — AB (ref 150–440)
RBC: 2.72 MIL/uL — AB (ref 3.80–5.20)
RDW: 21.3 % — AB (ref 11.5–14.5)
WBC: 5.7 10*3/uL (ref 3.6–11.0)

## 2017-12-01 LAB — BASIC METABOLIC PANEL
ANION GAP: 6 (ref 5–15)
BUN: 21 mg/dL — ABNORMAL HIGH (ref 6–20)
CALCIUM: 8.4 mg/dL — AB (ref 8.9–10.3)
CO2: 23 mmol/L (ref 22–32)
Chloride: 114 mmol/L — ABNORMAL HIGH (ref 98–111)
Creatinine, Ser: 1.56 mg/dL — ABNORMAL HIGH (ref 0.44–1.00)
GFR calc Af Amer: 44 mL/min — ABNORMAL LOW (ref >60)
GFR calc non Af Amer: 38 mL/min — ABNORMAL LOW (ref >60)
Glucose, Bld: 126 mg/dL — ABNORMAL HIGH (ref 70–99)
Potassium: 5.5 mmol/L — ABNORMAL HIGH (ref 3.5–5.1)
Sodium: 143 mmol/L (ref 135–145)

## 2017-12-01 LAB — TROPONIN I: Troponin I: <0.03 ng/mL (ref <0.03)

## 2017-12-01 LAB — GLUCOSE, CAPILLARY: Glucose-Capillary: 118 mg/dL — ABNORMAL HIGH (ref 70–99)

## 2017-12-01 MED ORDER — MORPHINE SULFATE (PF) 4 MG/ML IV SOLN
4.0000 mg | Freq: Once | INTRAVENOUS | Status: AC
  Administered 2017-12-02: 4 mg via INTRAVENOUS
  Filled 2017-12-01: qty 1

## 2017-12-01 MED ORDER — ONDANSETRON HCL 4 MG/2ML IJ SOLN
4.0000 mg | Freq: Once | INTRAMUSCULAR | Status: AC
  Administered 2017-12-02: 4 mg via INTRAVENOUS
  Filled 2017-12-01: qty 2

## 2017-12-01 NOTE — ED Triage Notes (Signed)
Patient c/o central chest pain radiating to back. Patient denies accompanying symptoms. Patient discharged from [facility] with PICC line and IV antibiotics on 9/20 for osteomyelitis of right foot.   Patient's initial CBG red low for EMS. Patient given 25 of D5.   Patient given 2 nitroglycerins, 324 aspirin by EMS in transport. Patient reports the nitroglycerin dropped decreased her pain from 10 to 7.

## 2017-12-01 NOTE — ED Notes (Signed)
ED Provider at bedside. 

## 2017-12-01 NOTE — ED Provider Notes (Signed)
[facility] Emergency Department Provider Note  ___________________________________________   First MD Initiated Contact with Patient 12/01/17 2259     (approximate)  I have reviewed the triage vital signs and the nursing notes.   HISTORY  Chief Complaint Chest Pain   HPI Kendra Schroeder is a 47 y.o. 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  Patient says the pain was originally at 10 out of 10 and radiating through to her back.  Denies any nausea, vomiting or shortness of breath.  Does not worsen with deep breathing.  Denies taking blood thinners.  Given aspirin as well as nitroglycerin in route which brought her pain down from a 10 to a 7.  Denies increased pain with movement.   Past Medical History:  Diagnosis Date  . CKD (chronic kidney disease), stage III (HCC)   . Diabetes mellitus without complication (HCC)   . GERD (gastroesophageal reflux disease)   . Hypertension   . Kidney transplant recipient   . Myocardial infarct [facility])     Patient Active Problem List   Diagnosis Date Noted  . Chest pain 01/25/2015  . GERD (gastroesophageal reflux disease) 01/25/2015  . Type 2 diabetes mellitus (HCC) 01/25/2015  . HTN (hypertension) 01/25/2015  . CKD (chronic kidney disease), stage III (HCC) 01/25/2015  . Kidney transplant recipient 01/25/2015    Past Surgical History:  Procedure Laterality Date  . COMBINED KIDNEY-PANCREAS TRANSPLANT    . EYE SURGERY    . NEPHRECTOMY TRANSPLANTED ORGAN    . TOE AMPUTATION      Prior to Admission medications   Medication Sig Start Date End Date Taking? Authorizing Provider  albuterol (PROVENTIL HFA;VENTOLIN HFA) 108 (90 BASE) MCG/ACT inhaler Inhale 2 puffs into the  lungs every 6 (six) hours as needed for wheezing or shortness of breath. 01/26/15   Katha Hamming, MD  aspirin 81 MG chewable tablet Chew 81 mg by mouth daily.    [provider]  atorvastatin (LIPITOR) 40 MG tablet Take 40 mg by mouth at bedtime.    [provider]  azithromycin (ZITHROMAX) 500 MG tablet Take 1 tablet (500 mg total) by mouth daily. 01/26/15   Katha Hamming, MD  calcitonin, salmon, (MIACALCIN/FORTICAL) 200 UNIT/ACT nasal spray Place 1 spray into alternate nostrils daily.    [provider]  calcium gluconate 500 MG tablet Take 1 tablet by mouth daily.    [provider]  enalapril (VASOTEC) 2.5 MG tablet Take 2.5 mg by mouth daily.    [provider]  gabapentin (NEURONTIN) 800 MG tablet Take 800 mg by mouth 2 (two) times daily.    [provider]  Multiple Vitamin (MULTIVITAMIN) tablet Take 1 tablet by mouth daily.    [provider]  MYFORTIC 180 MG EC tablet Take 540 mg by mouth 2 (two) times daily. (brand name only)    [provider]  omeprazole (PRILOSEC) 40 MG capsule Take 40 mg by mouth 2 (two) times daily.    [provider]  oxyCODONE (OXY IR/ROXICODONE) 5 MG immediate release tablet Take 5 mg by mouth every 4 (four) hours as needed for moderate pain or severe pain.     [provider]  PROGRAF 1 MG capsule Take 4 mg by mouth 2 (two) times daily.  [provider]    Allergies Percocet [oxycodone-acetaminophen]  Family History  Problem Relation Age of Onset  . Diabetes Unknown   . Cancer Unknown     Social History Social History   Tobacco Use  . Smoking status: Never Smoker  . Smokeless tobacco: Never Used  Substance Use Topics  . Alcohol use: No  . Drug use: No    Review of Systems  Constitutional: No fever/chills Eyes: No visual changes. ENT: No sore throat. Cardiovascular: As above Respiratory: Denies shortness of  breath. Gastrointestinal: No abdominal pain.  No nausea, no vomiting.  No diarrhea.  No constipation. Genitourinary: Negative for dysuria. Musculoskeletal: Negative for back pain. Skin: Negative for rash. Neurological: Negative for headaches, focal weakness or numbness.   ____________________________________________   PHYSICAL EXAM:  VITAL SIGNS: ED Triage Vitals  Enc Vitals Group     BP 12/01/17 2311 (!) 155/73     Pulse Rate 12/01/17 2311 80     Resp 12/01/17 2311 18     Temp 12/01/17 2302 98.1 F (36.7 C)     Temp Source 12/01/17 2302 Oral     SpO2 12/01/17 2311 100 %     Weight 12/01/17 2303 160 lb (72.6 kg)     Height 12/01/17 2303 5\' 2"  (1.575 m)     Head Circumference --      Peak Flow --      Pain Score 12/01/17 2303 7     Pain Loc --      Pain Edu? --      Excl. in GC? --     Constitutional: Alert and oriented. Well appearing and in no acute distress. Eyes: Conjunctivae are normal.  Head: Atraumatic. Nose: No congestion/rhinnorhea. Mouth/Throat: Mucous membranes are moist.  Neck: No stridor.   Cardiovascular: Normal rate, regular rhythm. Grossly normal heart sounds.  Good peripheral circulation with equal and bilateral radial as well as dorsalis pedis pulses.  Chest pain reproducible to palpation over the lower sternum at the midline. Respiratory: Normal respiratory effort.  No retractions. Lungs CTAB. Gastrointestinal: Soft and nontender. No distention.  Musculoskeletal: Mildly increased swelling to the right lower extremity which the patient says is improved.  This is where she had her diagnosis of osteomyelitis.  No erythema, no exudate.  Wound appears well-healed to the right lateral ankle.  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  No pus or induration or erythema.  Mild tenderness to palpation the mid thoracic region without any deformity or step-off.  Left upper extremity PICC line is in place without any exudate or  bleeding at the site.  No surrounding erythema, induration. Neurologic:  Normal speech and language. No gross focal neurologic deficits are appreciated. Skin:  Skin is warm, dry and intact. No rash noted. Psychiatric: Mood and affect are normal. Speech and behavior are normal.  ____________________________________________   LABS (all labs ordered are listed, but only abnormal results are displayed)  Labs Reviewed  GLUCOSE, CAPILLARY - Abnormal; Notable for the following components:      Result Value   Glucose-Capillary 118 (*)    All other components within normal limits  BASIC METABOLIC PANEL - Abnormal; Notable for the following components:   Potassium 5.5 (*)    Chloride 114 (*)    Glucose, Bld 126 (*)    BUN 21 (*)    Creatinine, Ser 1.56 (*)    Calcium 8.4 (*)    GFR calc non Af Amer 38 (*)  GFR calc Af Amer 44 (*)    All other components within normal limits  CBC - Abnormal; Notable for the following components:   RBC 2.72 (*)    Hemoglobin 7.8 (*)    HCT 25.0 (*)    MCHC 31.0 (*)    RDW 21.3 (*)    Platelets 111 (*)    All other components within normal limits  TROPONIN I  POC URINE PREG, ED  CBG MONITORING, ED  CBG MONITORING, ED  CBG MONITORING, ED  CBG MONITORING, ED  CBG MONITORING, ED  TYPE AND SCREEN   ____________________________________________  EKG  ED ECG REPORT I, Arelia Longest, the attending physician, personally viewed and interpreted this ECG.   Date: 12/01/2017  EKG Time: 2310  Rate: 80  Rhythm: normal sinus rhythm  Axis: Normal  Intervals: Right bundle branch block and left posterior fascicular block.  ST&T Change: No ST segment elevation or depression.  Biphasic T waves in V2 and V3.  No significant change from previous.  ____________________________________________  RADIOLOGY  No active cardiopulmonary disease on the chest x-ray ____________________________________________   PROCEDURES  Procedure(s) performed:    Procedures  Critical Care performed:   ____________________________________________   INITIAL IMPRESSION / ASSESSMENT AND PLAN / ED COURSE  Pertinent labs & imaging results that were available during my care of the patient were reviewed by me and considered in my medical decision making (see chart for details).  Differential diagnosis includes, but is not limited to, ACS, aortic dissection, pulmonary embolism, cardiac tamponade, pneumothorax, pneumonia, pericarditis, myocarditis, GI-related causes including esophagitis/gastritis, and musculoskeletal chest wall pain.   As part of my medical decision making, I reviewed the following data within the electronic MEDICAL RECORD NUMBER Old chart reviewed and Notes from prior ED visits  Patient with hemoglobin of 7.8 today.  However, results from [facility] reviewed from 24 September which showed that the patient's hemoglobin was at 8.2.  Has required blood transfusions in the past but says that she has never had chest pain when requiring transfusions.  Says that she is having normal brown stools without any blood evident.  ----------------------------------------- 12:03 AM on 12/02/2017 -----------------------------------------  Patient received morphine.  She says that she does okay with morphine and does not have hives or airway compromise.  Says that it has altered her mentation in the past.  However, due to her pain being again in a 10 out of 10 she is requesting pain medication.  Reassuring vital signs as well as initial lab work.  Normal mediastinum on chest x-ray with equal and bilateral pulses.  Concern for PE given sudden onset pain radiating through the back.  Mildly tachypneic.  We will perform a VQ scan because of the patient's kidney transplant in danger of giving her contrast load.  Patient aware of need for admission to the hospital.  Signed out to Dr. Anne Hahn. ____________________________________________   FINAL CLINICAL IMPRESSION(S) / ED  DIAGNOSES  Chest pain.  NEW MEDICATIONS STARTED DURING THIS VISIT:  New Prescriptions   No medications on file     Note:  This document was prepared using Dragon voice recognition software and may include unintentional dictation errors.     Myrna Blazer, MD 12/02/17 [phone]

## 2017-12-02 ENCOUNTER — Other Ambulatory Visit: Payer: Self-pay

## 2017-12-02 ENCOUNTER — Encounter: Payer: Self-pay | Admitting: Internal Medicine

## 2017-12-02 ENCOUNTER — Observation Stay (HOSPITAL_BASED_OUTPATIENT_CLINIC_OR_DEPARTMENT_OTHER)
Admit: 2017-12-02 | Discharge: 2017-12-02 | Disposition: A | Discharge status: Home / Self Care | Payer: Medicare Other | Attending: Internal Medicine | Admitting: Internal Medicine

## 2017-12-02 ENCOUNTER — Observation Stay: Payer: Medicare Other

## 2017-12-02 DIAGNOSIS — K219 Gastro-esophageal reflux disease without esophagitis: Secondary | ICD-10-CM

## 2017-12-02 DIAGNOSIS — R0789 Other chest pain: Secondary | ICD-10-CM | POA: Diagnosis not present

## 2017-12-02 DIAGNOSIS — I5030 Unspecified diastolic (congestive) heart failure: Secondary | ICD-10-CM | POA: Diagnosis not present

## 2017-12-02 DIAGNOSIS — R079 Chest pain, unspecified: Secondary | ICD-10-CM | POA: Diagnosis not present

## 2017-12-02 LAB — GLUCOSE, CAPILLARY
Glucose-Capillary: 119 mg/dL — ABNORMAL HIGH (ref 70–99)
Glucose-Capillary: 84 mg/dL (ref 70–99)
Glucose-Capillary: 89 mg/dL (ref 70–99)
Glucose-Capillary: 90 mg/dL (ref 70–99)
Glucose-Capillary: 91 mg/dL (ref 70–99)

## 2017-12-02 LAB — BASIC METABOLIC PANEL
Anion gap: 7 (ref 5–15)
BUN: 19 mg/dL (ref 6–20)
CO2: 23 mmol/L (ref 22–32)
Calcium: 8.9 mg/dL (ref 8.9–10.3)
Chloride: 111 mmol/L (ref 98–111)
Creatinine, Ser: 1.41 mg/dL — ABNORMAL HIGH (ref 0.44–1.00)
GFR calc Af Amer: 50 mL/min — ABNORMAL LOW (ref >60)
GFR calc non Af Amer: 43 mL/min — ABNORMAL LOW (ref >60)
GLUCOSE: 113 mg/dL — AB (ref 70–99)
Potassium: 5.2 mmol/L — ABNORMAL HIGH (ref 3.5–5.1)
Sodium: 141 mmol/L (ref 135–145)

## 2017-12-02 LAB — CBC
HEMATOCRIT: 24.6 % — AB (ref 35.0–47.0)
Hemoglobin: 7.7 g/dL — ABNORMAL LOW (ref 12.0–16.0)
MCH: 28.2 pg (ref 26.0–34.0)
MCHC: 31.2 g/dL — AB (ref 32.0–36.0)
MCV: 90.4 fL (ref 80.0–100.0)
Platelets: 91 10*3/uL — ABNORMAL LOW (ref 150–440)
RBC: 2.73 MIL/uL — ABNORMAL LOW (ref 3.80–5.20)
RDW: 20.1 % — AB (ref 11.5–14.5)
WBC: 6.6 10*3/uL (ref 3.6–11.0)

## 2017-12-02 LAB — ECHOCARDIOGRAM COMPLETE
Height: 62 in
Weight: 2616 oz

## 2017-12-02 LAB — ABO/RH: ABO/RH(D): A POS

## 2017-12-02 LAB — TROPONIN I: Troponin I: <0.03 ng/mL (ref <0.03)

## 2017-12-02 MED ORDER — OXYCODONE HCL 5 MG PO TABS
5.0000 mg | ORAL_TABLET | ORAL | Status: DC | PRN
  Administered 2017-12-02: 5 mg via ORAL

## 2017-12-02 MED ORDER — ONDANSETRON HCL 4 MG/2ML IJ SOLN
4.0000 mg | Freq: Four times a day (QID) | INTRAMUSCULAR | Status: DC | PRN
  Administered 2017-12-02: 4 mg via INTRAVENOUS
  Filled 2017-12-02: qty 2

## 2017-12-02 MED ORDER — MYCOPHENOLATE SODIUM 180 MG PO TBEC
540.0000 mg | DELAYED_RELEASE_TABLET | Freq: Two times a day (BID) | ORAL | Status: DC
  Administered 2017-12-02 (×2): 540 mg via ORAL
  Filled 2017-12-02 (×3): qty 3

## 2017-12-02 MED ORDER — ASPIRIN 81 MG PO CHEW
81.0000 mg | CHEWABLE_TABLET | Freq: Every day | ORAL | Status: DC
  Administered 2017-12-02: 81 mg via ORAL
  Filled 2017-12-02: qty 1

## 2017-12-02 MED ORDER — SODIUM CHLORIDE 0.9 % IV SOLN
Freq: Once | INTRAVENOUS | Status: AC
  Administered 2017-12-02: 01:00:00 via INTRAVENOUS

## 2017-12-02 MED ORDER — PANTOPRAZOLE SODIUM 40 MG PO TBEC
40.0000 mg | DELAYED_RELEASE_TABLET | Freq: Two times a day (BID) | ORAL | Status: DC
  Administered 2017-12-02 (×2): 40 mg via ORAL
  Filled 2017-12-02 (×2): qty 1

## 2017-12-02 MED ORDER — AMITRIPTYLINE HCL 50 MG PO TABS
25.0000 mg | ORAL_TABLET | Freq: Two times a day (BID) | ORAL | Status: DC
  Filled 2017-12-02: qty 1
  Filled 2017-12-02 (×2): qty 0.5

## 2017-12-02 MED ORDER — GI COCKTAIL ~~LOC~~
30.0000 mL | Freq: Once | ORAL | Status: AC
  Administered 2017-12-02: 30 mL via ORAL
  Filled 2017-12-02: qty 30

## 2017-12-02 MED ORDER — KETOROLAC TROMETHAMINE 30 MG/ML IJ SOLN
30.0000 mg | Freq: Once | INTRAMUSCULAR | Status: AC
  Administered 2017-12-02: 30 mg via INTRAVENOUS
  Filled 2017-12-02: qty 1

## 2017-12-02 MED ORDER — FUROSEMIDE 40 MG PO TABS
20.0000 mg | ORAL_TABLET | Freq: Every day | ORAL | Status: DC
  Administered 2017-12-02: 20 mg via ORAL
  Filled 2017-12-02: qty 1

## 2017-12-02 MED ORDER — SODIUM CHLORIDE 0.9 % IV SOLN
2.0000 g | Freq: Two times a day (BID) | INTRAVENOUS | Status: DC
  Administered 2017-12-02: 2 g via INTRAVENOUS
  Filled 2017-12-02 (×2): qty 2

## 2017-12-02 MED ORDER — AMLODIPINE BESYLATE 5 MG PO TABS
5.0000 mg | ORAL_TABLET | Freq: Every day | ORAL | Status: DC
  Administered 2017-12-02: 5 mg via ORAL
  Filled 2017-12-02: qty 1

## 2017-12-02 MED ORDER — TACROLIMUS 1 MG PO CAPS
4.0000 mg | ORAL_CAPSULE | Freq: Two times a day (BID) | ORAL | Status: DC
  Administered 2017-12-02 (×2): 4 mg via ORAL
  Filled 2017-12-02 (×3): qty 4

## 2017-12-02 MED ORDER — ACETAMINOPHEN 650 MG RE SUPP: 650.0000 mg | Freq: Four times a day (QID) | RECTAL | Status: DC | PRN

## 2017-12-02 MED ORDER — OXYCODONE HCL 5 MG PO TABS
ORAL_TABLET | ORAL | Status: AC
  Filled 2017-12-02: qty 1

## 2017-12-02 MED ORDER — CARVEDILOL 25 MG PO TABS
25.0000 mg | ORAL_TABLET | Freq: Once | ORAL | Status: AC
  Administered 2017-12-02: 25 mg via ORAL

## 2017-12-02 MED ORDER — DOXYCYCLINE HYCLATE 100 MG PO TABS
100.0000 mg | ORAL_TABLET | Freq: Two times a day (BID) | ORAL | Status: DC
Start: 2017-12-02 — End: 2017-12-02
  Administered 2017-12-02 (×2): 100 mg via ORAL
  Filled 2017-12-02 (×2): qty 1

## 2017-12-02 MED ORDER — HEPARIN SODIUM (PORCINE) 5000 UNIT/ML IJ SOLN
5000.0000 [IU] | Freq: Three times a day (TID) | INTRAMUSCULAR | Status: DC
  Administered 2017-12-02: 5000 [IU] via SUBCUTANEOUS
  Filled 2017-12-02: qty 1

## 2017-12-02 MED ORDER — NITROGLYCERIN 0.4 MG SL SUBL
0.4000 mg | SUBLINGUAL_TABLET | SUBLINGUAL | Status: DC | PRN
  Administered 2017-12-02 (×2): 0.4 mg via SUBLINGUAL
  Filled 2017-12-02: qty 1

## 2017-12-02 MED ORDER — CALCIUM CARBONATE-VITAMIN D 500-200 MG-UNIT PO TABS
1.0000 | ORAL_TABLET | Freq: Every day | ORAL | Status: DC
  Administered 2017-12-02: 1 via ORAL
  Filled 2017-12-02: qty 1

## 2017-12-02 MED ORDER — ONDANSETRON HCL 4 MG PO TABS: 4.0000 mg | ORAL_TABLET | Freq: Four times a day (QID) | ORAL | Status: DC | PRN

## 2017-12-02 MED ORDER — TECHNETIUM TO 99M ALBUMIN AGGREGATED
4.0000 | Freq: Once | INTRAVENOUS | Status: AC | PRN
  Administered 2017-12-02: 4.243 via INTRAVENOUS

## 2017-12-02 MED ORDER — INFLUENZA VAC SPLIT QUAD 0.5 ML IM SUSY: 0.5000 mL | PREFILLED_SYRINGE | INTRAMUSCULAR | Status: DC

## 2017-12-02 MED ORDER — CARVEDILOL 25 MG PO TABS
25.0000 mg | ORAL_TABLET | Freq: Two times a day (BID) | ORAL | Status: DC
  Administered 2017-12-02: 25 mg via ORAL
  Filled 2017-12-02: qty 1
  Filled 2017-12-02: qty 2

## 2017-12-02 MED ORDER — HYDROMORPHONE HCL 1 MG/ML IJ SOLN
0.5000 mg | Freq: Once | INTRAMUSCULAR | Status: AC
  Administered 2017-12-02: 0.5 mg via INTRAVENOUS
  Filled 2017-12-02: qty 1

## 2017-12-02 MED ORDER — TECHNETIUM TC 99M DIETHYLENETRIAME-PENTAACETIC ACID
30.0000 | Freq: Once | INTRAVENOUS | Status: AC | PRN
  Administered 2017-12-02: 32.523 via INTRAVENOUS

## 2017-12-02 MED ORDER — ACETAMINOPHEN 325 MG PO TABS: 650.0000 mg | ORAL_TABLET | Freq: Four times a day (QID) | ORAL | Status: DC | PRN

## 2017-12-02 MED ORDER — PREDNISONE 10 MG PO TABS
5.0000 mg | ORAL_TABLET | Freq: Every day | ORAL | Status: DC
  Administered 2017-12-02: 5 mg via ORAL
  Filled 2017-12-02: qty 1

## 2017-12-02 MED FILL — MYFORTIC 180 MG TABLET,DELAYED RELEASE: ORAL | 30 days supply | Qty: 180 | Fill #1

## 2017-12-02 MED FILL — MYFORTIC 180 MG TABLET,DELAYED RELEASE: 30 days supply | Qty: 180 | Fill #1 | Status: AC

## 2017-12-02 MED FILL — PROGRAF 1 MG CAPSULE: 30 days supply | Qty: 240 | Fill #0 | Status: AC

## 2017-12-02 NOTE — ED Notes (Signed)
This RN replaced tegaderm dressing on PIV left arm.

## 2017-12-02 NOTE — H&P (Signed)
[facility] - [place] at [facility]   PATIENT NAME: Kendra Schroeder    MR#:  [id number]  DATE OF BIRTH:  22-Jul-1970  DATE OF ADMISSION:  12/01/2017  PRIMARY CARE PHYSICIAN: True, Earnest Rosier, MD   REQUESTING/REFERRING PHYSICIAN: Pershing Proud, MD  CHIEF COMPLAINT:   Chief Complaint  Patient presents with  . Chest Pain    HISTORY OF PRESENT ILLNESS:  Kendra Schroeder  is a 47 y.o. female who presents with chief complaint as above.  Patient states that she began experiencing central chest pain that radiated through to her back around 8:00 tonight.  She states that she just finished eating dinner.  She stated that she was not engaged in any significant activity when the pain started, she was sitting down.  She went to lay down and rest some but the pain got worse.  She called EMS and was given sublingual nitro in route to the hospital which she says alleviated her pain some.  Here in the ED her initial work-up is largely within normal limits.  VQ scan was ordered by ED physician as he did not want to submit her to contrast given that she only has one kidney, which is a transplanted kidney.  Patient has persistent pain, and hospitalist were called for admission and further evaluation  PAST MEDICAL HISTORY:   Past Medical History:  Diagnosis Date  . CKD (chronic kidney disease), stage III (HCC)   . Diabetes mellitus without complication (HCC)   . GERD (gastroesophageal reflux disease)   . Hypertension   . Kidney transplant recipient   . Myocardial infarct [facility])      PAST SURGICAL HISTORY:   Past Surgical History:  Procedure Laterality Date  . COMBINED KIDNEY-PANCREAS TRANSPLANT    . EYE SURGERY    . NEPHRECTOMY TRANSPLANTED ORGAN    . TOE AMPUTATION       SOCIAL HISTORY:   Social History   Tobacco Use  . Smoking status: Never Smoker  . Smokeless tobacco: Never Used  Substance Use Topics  . Alcohol use: No     FAMILY HISTORY:   Family History  Problem  Relation Age of Onset  . Diabetes Unknown   . Cancer Unknown      DRUG ALLERGIES:   Allergies  Allergen Reactions  . Percocet [Oxycodone-Acetaminophen]     Confusion, dizziness    MEDICATIONS AT HOME:   Prior to Admission medications   Medication Sig Start Date End Date Taking? Authorizing Provider  albuterol (PROVENTIL HFA;VENTOLIN HFA) 108 (90 BASE) MCG/ACT inhaler Inhale 2 puffs into the lungs every 6 (six) hours as needed for wheezing or shortness of breath. 01/26/15   Katha Hamming, MD  aspirin 81 MG chewable tablet Chew 81 mg by mouth daily.    [provider]  atorvastatin (LIPITOR) 40 MG tablet Take 40 mg by mouth at bedtime.    [provider]  azithromycin (ZITHROMAX) 500 MG tablet Take 1 tablet (500 mg total) by mouth daily. 01/26/15   Katha Hamming, MD  calcitonin, salmon, (MIACALCIN/FORTICAL) 200 UNIT/ACT nasal spray Place 1 spray into alternate nostrils daily.    [provider]  calcium gluconate 500 MG tablet Take 1 tablet by mouth daily.    [provider]  enalapril (VASOTEC) 2.5 MG tablet Take 2.5 mg by mouth daily.    [provider]  gabapentin (NEURONTIN) 800 MG tablet Take 800 mg by mouth 2 (two) times daily.    [provider]  Multiple Vitamin (  MULTIVITAMIN) tablet Take 1 tablet by mouth daily.    [provider]  MYFORTIC 180 MG EC tablet Take 540 mg by mouth 2 (two) times daily. (brand name only)    [provider]  omeprazole (PRILOSEC) 40 MG capsule Take 40 mg by mouth 2 (two) times daily.    [provider]  oxyCODONE (OXY IR/ROXICODONE) 5 MG immediate release tablet Take 5 mg by mouth every 4 (four) hours as needed for moderate pain or severe pain.     [provider]  PROGRAF 1 MG capsule Take 4 mg by mouth 2 (two) times daily.    [provider]    REVIEW OF SYSTEMS:  Review of Systems  Constitutional: Negative for chills, fever,  malaise/fatigue and weight loss.  HENT: Negative for ear pain, hearing loss and tinnitus.   Eyes: Negative for blurred vision, double vision, pain and redness.  Respiratory: Negative for cough, hemoptysis and shortness of breath.   Cardiovascular: Positive for chest pain. Negative for palpitations, orthopnea and leg swelling.  Gastrointestinal: Negative for abdominal pain, constipation, diarrhea, nausea and vomiting.  Genitourinary: Negative for dysuria, frequency and hematuria.  Musculoskeletal: Negative for back pain, joint pain and neck pain.  Skin:       No acne, rash, or lesions  Neurological: Negative for dizziness, tremors, focal weakness and weakness.  Endo/Heme/Allergies: Negative for polydipsia. Does not bruise/bleed easily.  Psychiatric/Behavioral: Negative for depression. The patient is not nervous/anxious and does not have insomnia.      VITAL SIGNS:   Vitals:   12/01/17 2303 12/01/17 2311 12/01/17 2315 12/01/17 2345  BP:  (!) 155/73    Pulse:  80 81 85  Resp:  18 (!) 24 (!) 27  Temp:      TempSrc:      SpO2:  100% 100% 100%  Weight: 72.6 kg     Height: 5\' 2"  (1.575 m)      Wt Readings from Last 3 Encounters:  12/01/17 72.6 kg  08/25/16 71.7 kg  01/26/15 69.5 kg    PHYSICAL EXAMINATION:  Physical Exam  Vitals reviewed. Constitutional: She is oriented to person, place, and time. She appears well-developed and well-nourished. She appears distressed.  HENT:  Head: Normocephalic and atraumatic.  Mouth/Throat: Oropharynx is clear and moist.  Eyes: Pupils are equal, round, and reactive to light. Conjunctivae and EOM are normal. No scleral icterus.  Neck: Normal range of motion. Neck supple. No JVD present. No thyromegaly present.  Cardiovascular: Normal rate, regular rhythm and intact distal pulses. Exam reveals no gallop and no friction rub.  No murmur heard. Respiratory: Effort normal and breath sounds normal. No respiratory distress. She has no wheezes. She has  no rales.  GI: Soft. Bowel sounds are normal. She exhibits no distension. There is no tenderness.  Musculoskeletal: Normal range of motion. She exhibits no edema.  No arthritis, no gout  Lymphadenopathy:    She has no cervical adenopathy.  Neurological: She is alert and oriented to person, place, and time. No cranial nerve deficit.  No dysarthria, no aphasia  Skin: Skin is warm and dry. No rash noted. No erythema.  Psychiatric: She has a normal mood and affect. Her behavior is normal. Judgment and thought content normal.    LABORATORY PANEL:   CBC Recent Labs  Lab 12/01/17 2311  WBC 5.7  HGB 7.8*  HCT 25.0*  PLT 111*   ------------------------------------------------------------------------------------------------------------------  Chemistries  Recent Labs  Lab 12/01/17 2311  NA 143  K  5.5*  CL 114*  CO2 23  GLUCOSE 126*  BUN 21*  CREATININE 1.56*  CALCIUM 8.4*   ------------------------------------------------------------------------------------------------------------------  Cardiac Enzymes Recent Labs  Lab 12/01/17 2311  TROPONINI <0.03   ------------------------------------------------------------------------------------------------------------------  RADIOLOGY:  Dg Chest 2 View  Result Date: 12/01/2017 CLINICAL DATA:  Central chest pain radiating to the back. Foot osteomyelitis EXAM: CHEST - 2 VIEW COMPARISON:  08/25/2016 FINDINGS: Chronic cardiomegaly. Mild interstitial coarsening. There is no edema, consolidation, effusion, or pneumothorax. Venous stenting in the right axilla and upper arm. No acute osseous finding. IMPRESSION: No evidence of active disease. Electronically Signed   By: Marnee Spring M.D.   On: 12/01/2017 23:43    EKG:   Orders placed or performed during the hospital encounter of 12/01/17  . ED EKG within 10 minutes  . ED EKG within 10 minutes  . EKG 12-Lead  . EKG 12-Lead    IMPRESSION AND PLAN:  Principal Problem:   Chest  pain -unclear etiology upfront.  Patient has no prior history of CAD documented on chart review.  We will trend her cardiac enzymes, initial enzymes in the ED was negative.  We will get an echocardiogram and cardiology consult.  She also has a VQ scan pending to rule out PE.  If all this work-up is negative she could potentially benefit from GI consult to evaluate esophageal dysmotility pathologies if her pain continues to be persistent. Active Problems:   Type 2 diabetes mellitus (HCC) -sliding scale insulin with corresponding glucose checks   HTN (hypertension) -home dose antihypertensives   CKD (chronic kidney disease), stage III (HCC) -stable, at baseline, avoid nephrotoxins and monitor   Kidney transplant recipient -continue antirejection medications.  No need for nephrology consult at this time   GERD (gastroesophageal reflux disease) -Home dose PPI  Chart review performed and case discussed with ED provider. Labs, imaging and/or ECG reviewed by provider and discussed with patient/family. Management plans discussed with the patient and/or family.  DVT PROPHYLAXIS: SubQ heparin  GI PROPHYLAXIS:  PPI   ADMISSION STATUS: Observation  CODE STATUS: Full Code Status History    Date Active Date Inactive Code Status Order ID Comments User Context   01/26/2015 0001 01/26/2015 1951 Full Code [id number]  Oralia Manis, MD ED      TOTAL TIME TAKING CARE OF THIS PATIENT: 40 minutes.   Kirby Cortese FIELDING 12/02/2017, 12:15 AM  [facility]  Office  [phone]  CC: Primary care physician; True, Earnest Rosier, MD  Note:  This document was prepared using Dragon voice recognition software and may include unintentional dictation errors.

## 2017-12-02 NOTE — Progress Notes (Signed)
Pt continues to have chest pain despite receiving IV dilaudid, MD paged, Dr. Sheryle Hail to put in one time order for GI cocktail, will give & continue to monitor. Shirley Friar, RN, BSN

## 2017-12-02 NOTE — Progress Notes (Signed)
*  PRELIMINARY RESULTS* Echocardiogram 2D Echocardiogram has been performed.  Cristela Blue 12/02/2017, 9:54 AM

## 2017-12-02 NOTE — Plan of Care (Signed)
  Problem: Coping: Goal: Level of anxiety will decrease Outcome: Progressing   Problem: Safety: Goal: Ability to remain free from injury will improve Outcome: Progressing   Problem: Pain Managment: Goal: General experience of comfort will improve Outcome: Not Progressing Note:  Pt continued to has chest pain since coming from the ER, given multiple medications without relief.   Problem: Education: Goal: Knowledge of General Education information will improve Description Including pain rating scale, medication(s)/side effects and non-pharmacologic comfort measures Outcome: Completed/Met

## 2017-12-02 NOTE — Progress Notes (Signed)
Pt still complaining of 9 out of 10 chest pain radiating to the back after receiving 4mg  IV morphine & oxycodone IR in ED, and receiving 2 SL nitro once on the floor with no relief. MD paged, Dr. Anne Hahn to put in orders for dilaudid & stated to hold the 3rd dose of SL nitro since it has not worked, and decreased BP significantly. Will give dilaudid & continue to monitor. Shirley Friar, RN, BSN

## 2017-12-02 NOTE — Progress Notes (Addendum)
Per Dr. Kirke Corin pt is cleared to be discharged and f/u in office 10/28/ discharged with PICC in place/ iv and tele removed/ will transport off unit via wheelchair when ride arrives

## 2017-12-02 NOTE — Discharge Summary (Signed)
[employer] [facility] [place] [facility]   PATIENT NAME: Kendra Schroeder    MR#:  [id number]  DATE OF BIRTH:  Jul 18, 1970  DATE OF ADMISSION:  12/01/2017 ADMITTING PHYSICIAN: Arnaldo Natal, MD  DATE OF DISCHARGE: 12/02/2017  2:08 PM  PRIMARY CARE PHYSICIAN: True, Earnest Rosier, MD    ADMISSION DIAGNOSIS:  Chest pain, unspecified type [R07.9]  DISCHARGE DIAGNOSIS:  Principal Problem:   Chest pain Active Problems:   GERD (gastroesophageal reflux disease)   Type 2 diabetes mellitus (HCC)   HTN (hypertension)   CKD (chronic kidney disease), stage III (HCC)   Kidney transplant recipient   SECONDARY DIAGNOSIS:   Past Medical History:  Diagnosis Date  . CKD (chronic kidney disease), stage III (HCC)   . Diabetes mellitus without complication (HCC)   . GERD (gastroesophageal reflux disease)   . Hypertension   . Kidney transplant recipient   . Myocardial infarct [facility])     HOSPITAL COURSE:   47 year old female with past medical history of renal transplant, CKD stage III, diabetes, GERD, hypertension, previous history of MI who presents to the hospital due to chest pain.  1.  Chest pain-given patient's risk factors she was observed overnight on telemetry.  She had 3 sets of cardiac markers checked which were negative.  Patient underwent echocardiogram which showed normal ejection fraction with no wall motion abnormalities. She was seen by cardiology who did not recommend any acute intervention. -Patient's pain is most likely musculoskeletal or GI related.  It improved with some Toradol. -She is being discharged home with outpatient follow-up with cardiology.  2.  History of CKD stage III status post renal transplant-patient will continue her prednisone, immunosuppressants as stated below  3.  History of chronic right foot osteomyelitis-patient is on chronic antibiotics, she will continue her ceftazidime, moxifloxacin, Flagyl and follow-up with infectious disease at [facility].  4.   Essential hypertension-patient will continue her carvedilol, Norvasc.  5.  GERD-patient will continue omeprazole.  DISCHARGE CONDITIONS:   Able  CONSULTS OBTAINED:  Treatment Team:  Iran Ouch, MD  DRUG ALLERGIES:   Allergies  Allergen Reactions  . Percocet [Oxycodone-Acetaminophen] Other (See Comments)    Confusion, dizziness    DISCHARGE MEDICATIONS:   Allergies as of 12/02/2017      Reactions   Percocet [oxycodone-acetaminophen] Other (See Comments)   Confusion, dizziness      Medication List    STOP taking these medications   azithromycin 500 MG tablet Commonly known as:  ZITHROMAX     TAKE these medications   albuterol 108 (90 Base) MCG/ACT inhaler Commonly known as:  PROVENTIL HFA;VENTOLIN HFA Inhale 2 puffs into the lungs every 6 (six) hours as needed for wheezing or shortness of breath.   amitriptyline 25 MG tablet Commonly known as:  ELAVIL Take 25 mg by mouth 2 (two) times daily.   amLODipine 5 MG tablet Commonly known as:  NORVASC Take 5 mg by mouth daily.   aspirin 81 MG EC tablet Take 81 mg by mouth daily.   calcium-vitamin D 500-200 MG-UNIT tablet Commonly known as:  OSCAL WITH D Take 1 tablet by mouth daily.   carvedilol 25 MG tablet Commonly known as:  COREG Take 25 mg by mouth 2 (two) times daily with a meal.   cefTAZidime 2 g in sodium chloride 0.9 % 100 mL Inject 2 g into the vein every 12 (twelve) hours.   doxycycline 100 MG tablet Commonly known as:  VIBRA-TABS Take 100 mg by mouth  2 (two) times daily.   DUREZOL 0.05 % Emul Generic drug:  Difluprednate Apply 1 drop to eye daily as needed.   furosemide 20 MG tablet Commonly known as:  LASIX Take 20 mg by mouth daily.   HEPARIN LOCK FLUSH IV Inject 3 mLs into the vein daily.   moxifloxacin 0.5 % ophthalmic solution Commonly known as:  VIGAMOX Place 1 drop into both eyes daily as needed.   multivitamin tablet Take 1 tablet by mouth daily.   MYFORTIC 180 MG EC  tablet Generic drug:  mycophenolate Take 540 mg by mouth 2 (two) times daily. (brand name only)   omeprazole 40 MG capsule Commonly known as:  PRILOSEC Take 40 mg by mouth 2 (two) times daily.   polyethylene glycol packet Commonly known as:  MIRALAX / GLYCOLAX Take 17 g by mouth daily as needed for mild constipation.   predniSONE 2.5 MG tablet Commonly known as:  DELTASONE Take 5 mg by mouth daily.   PROGRAF 1 MG capsule Generic drug:  tacrolimus Take 2-3 mg by mouth 2 (two) times daily. 3mg  in the morning and 2mg  in the evening   silver sulfADIAZINE 1 % cream Commonly known as:  SILVADENE Apply 1 application topically daily.         DISCHARGE INSTRUCTIONS:   DIET:  Cardiac diet and Renal diet  DISCHARGE CONDITION:  Stable  ACTIVITY:  Activity as tolerated  OXYGEN:  Home Oxygen: No.   Oxygen Delivery: room air  DISCHARGE LOCATION:  home   If you experience worsening of your admission symptoms, develop shortness of breath, life threatening emergency, suicidal or homicidal thoughts you must seek medical attention immediately by calling 911 or calling your MD immediately  if symptoms less severe.  You Must read complete instructions/literature along with all the possible adverse reactions/side effects for all the Medicines you take and that have been prescribed to you. Take any new Medicines after you have completely understood and accpet all the possible adverse reactions/side effects.   Please note  You were cared for by a hospitalist during your hospital stay. If you have any questions about your discharge medications or the care you received while you were in the hospital after you are discharged, you can call the unit and asked to speak with the hospitalist on call if the hospitalist that took care of you is not available. Once you are discharged, your primary care physician will handle any further medical issues. Please note that NO REFILLS for any discharge  medications will be authorized once you are discharged, as it is imperative that you return to your primary care physician (or establish a relationship with a primary care physician if you do not have one) for your aftercare needs so that they can reassess your need for medications and monitor your lab values.     Today   Patient denies any further chest pain, is not hypoxic.  Seen by cardiology, no plans for acute intervention.  Will discharge home today.  VITAL SIGNS:  Blood pressure 135/64, pulse 82, temperature 97.6 F (36.4 C), temperature source Oral, resp. rate 16, height 5\' 2"  (1.575 m), weight 74.2 kg, last menstrual period 12/03/2014, SpO2 90 %.  I/O:    Intake/Output Summary (Last 24 hours) at 12/02/2017 1545 Last data filed at 12/02/2017 1342 Gross per 24 hour  Intake 338.32 ml  Output 400 ml  Net -61.68 ml    PHYSICAL EXAMINATION:  GENERAL:  47 y.o.-year-old patient lying in the bed with  no acute distress.  EYES: Pupils equal, round, reactive to light and accommodation. No scleral icterus. Extraocular muscles intact.  HEENT: Head atraumatic, normocephalic. Oropharynx and nasopharynx clear.  NECK:  Supple, no jugular venous distention. No thyroid enlargement, no tenderness.  LUNGS: Normal breath sounds bilaterally, no wheezing, rales,rhonchi. No use of accessory muscles of respiration.  CARDIOVASCULAR: S1, S2 normal. No murmurs, rubs, or gallops.  ABDOMEN: Soft, non-tender, non-distended. Bowel sounds present. No organomegaly or mass.  EXTREMITIES: No pedal edema, cyanosis, or clubbing.  NEUROLOGIC: Cranial nerves II through XII are intact. No focal motor or sensory defecits b/l.  PSYCHIATRIC: The patient is alert and oriented x 3.  SKIN: No obvious rash, lesion, or ulcer.   DATA REVIEW:   CBC Recent Labs  Lab 12/02/17 0620  WBC 6.6  HGB 7.7*  HCT 24.6*  PLT 91*    Chemistries  Recent Labs  Lab 12/02/17 0620  NA 141  K 5.2*  CL 111  CO2 23  GLUCOSE  113*  BUN 19  CREATININE 1.41*  CALCIUM 8.9    Cardiac Enzymes Recent Labs  Lab 12/02/17 1116  TROPONINI <0.03      RADIOLOGY:  Dg Chest 2 View  Result Date: 12/01/2017 CLINICAL DATA:  Central chest pain radiating to the back. Foot osteomyelitis EXAM: CHEST - 2 VIEW COMPARISON:  08/25/2016 FINDINGS: Chronic cardiomegaly. Mild interstitial coarsening. There is no edema, consolidation, effusion, or pneumothorax. Venous stenting in the right axilla and upper arm. No acute osseous finding. IMPRESSION: No evidence of active disease. Electronically Signed   By: Marnee Spring M.D.   On: 12/01/2017 23:43   Nm Pulmonary Vent And Perf (v/q Scan)  Result Date: 12/02/2017 CLINICAL DATA:  Chest pain.  PE suspected, high pretest prob EXAM: NUCLEAR MEDICINE VENTILATION - PERFUSION LUNG SCAN TECHNIQUE: Ventilation images were obtained in multiple projections using inhaled aerosol Tc-28m DTPA. Perfusion images were obtained in multiple projections after intravenous injection of Tc-54m-MAA. RADIOPHARMACEUTICALS:  32.523 mCi of Tc-43m DTPA aerosol inhalation and 4.243 mCi Tc63m-MAA IV COMPARISON:  Chest radiograph yesterday. FINDINGS: Ventilation: Heterogeneous ventilation in the right lung, ventilation more heterogeneous than perfusion. Mild central deposition of radiotracer in the trachea. Perfusion: Single small peripheral defect in the left lower lobe laterally, indicating low probability of pulmonary embolus by PIOPED II. Homogeneous perfusion to the right lung. IMPRESSION: Low probability for pulmonary embolus. Ventilation more heterogeneous than perfusion imaging, which can be seen with small airways disease. Electronically Signed   By: Narda Rutherford M.D.   On: 12/02/2017 05:59      Management plans discussed with the patient, family and they are in agreement.  CODE STATUS:     Code Status Orders  (From admission, onward)         Start     Ordered   12/02/17 0131  Full code  Continuous      12/02/17 0130       TOTAL TIME TAKING CARE OF THIS PATIENT: 40 minutes.    Houston Siren M.D on 12/02/2017 at 3:45 PM  Between 7am to 6pm - Pager - [phone]  After 6pm go to [email or web address] - Social research officer, government  [facility] [place] [facility]  Office  [phone]  CC: Primary care physician; True, Earnest Rosier, MD

## 2017-12-02 NOTE — Consult Note (Signed)
Cardiology Consultation:   Patient ID: Kendra Schroeder MRN: [id number]; DOB: Nov 04, 1970  Admit date: 12/01/2017 Date of Consult: 12/02/2017  Primary Care Provider: Carlene Coria Earnest Rosier, MD Primary Cardiologist: [facility]- Dr. Kirke Corin Primary Electrophysiologist:  None   Patient Profile:   Kendra Schroeder is a 47 y.o. 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 Anne Hahn.  History of Present Illness:   Kendra Schroeder is a 47 yo female with PMH as above, no known CAD or cardiac history other than HBP s/p kidney transplant, and recently on PICC line d/t osteomyelitis of R foot.  Patient reports that she was hospitalized 3 months for her foot and then spent additional time in the hospital d/t a reaction to the abx received for the R foot infection. Of note, she is s/p kidney transplant. No reported family history of cardiac disease.  Patient denies a history of cardiac disease but does report that she has seen a Dr. Juliann Pares in the past for problems with HTN s/p transplant. Previous notes reference a history of MI / CAD, which I have been unable to locate in Epic and of which the patient currently denies a history.   Yesterday 12/01/17, the patient reportedly had sharp, stabbing, central chest pain, rated 10/10, that "shot straight through to her back" and occurred just after finishing dinner around 8PM and while seated. Dinner that night was reportedly fried chicken, macaroni and cheese, and coleslaw and the same thing that she had eaten for lunch that day. The patient stated that right after eating she had 2-3 episodes of very sharp chest pian that lasted only seconds. Associated sx reported included both neck stiffness and HA that occurred with each episode and did not last more than minutes. The pain then became worse with rest / laying down and no reported alleviating symptoms. The patient has  never had pain like this before. She therefore called EMS and received SL nitro which did not alleviate her pain.   In the ED Vitals: BP 155/73, HR 80, RR18, T 98.1, SpO2 100%  Labs: glucose 1178, K 5.5, Cr 1.56, Ca 8.4, Hgb 7.8, plts 111 EKG: 80bpm, RBBB, LPFB CXR: No active disease Meds: Morphine, SL nitro, oxy, and toradol with no alleviation of pain   The chest pain continued throughout the time in the ED and admission and reportedly did not alleviate until receiving Toradol this morning 10/7.  Past Medical History:  Diagnosis Date  . CKD (chronic kidney disease), stage III (HCC)   . Diabetes mellitus without complication (HCC)   . GERD (gastroesophageal reflux disease)   . Hypertension   . Kidney transplant recipient   . Myocardial infarct [facility])     Past Surgical History:  Procedure Laterality Date  . COMBINED KIDNEY-PANCREAS TRANSPLANT    . EYE SURGERY    . NEPHRECTOMY TRANSPLANTED ORGAN    . TOE AMPUTATION       Home Medications:  Prior to Admission medications   Medication Sig Start Date End Date Taking? Authorizing Provider  albuterol (PROVENTIL HFA;VENTOLIN HFA) 108 (90 BASE) MCG/ACT inhaler Inhale 2 puffs into the lungs every 6 (six) hours as needed for wheezing or shortness of breath. 01/26/15  Yes Katha Hamming, MD  amitriptyline (ELAVIL) 25 MG tablet Take 25 mg by mouth 2 (two) times daily.   Yes [provider]  amLODipine (NORVASC)  5 MG tablet Take 5 mg by mouth daily.   Yes [provider]  aspirin 81 MG EC tablet Take 81 mg by mouth daily.    Yes [provider]  calcium-vitamin D (OSCAL WITH D) 500-200 MG-UNIT tablet Take 1 tablet by mouth daily.   Yes [provider]  carvedilol (COREG) 25 MG tablet Take 25 mg by mouth 2 (two) times daily with a meal.   Yes [provider]  cefTAZidime 2 g in sodium chloride 0.9 % 100 mL Inject 2 g into the vein every 12 (twelve) hours.   Yes [provider]    Difluprednate (DUREZOL) 0.05 % EMUL Apply 1 drop to eye daily as needed.   Yes [provider]  doxycycline (VIBRA-TABS) 100 MG tablet Take 100 mg by mouth 2 (two) times daily. 11/26/17  Yes [provider]  furosemide (LASIX) 20 MG tablet Take 20 mg by mouth daily.   Yes [provider]  HEPARIN LOCK FLUSH IV Inject 3 mLs into the vein daily.   Yes [provider]  moxifloxacin (VIGAMOX) 0.5 % ophthalmic solution Place 1 drop into both eyes daily as needed.   Yes [provider]  Multiple Vitamin (MULTIVITAMIN) tablet Take 1 tablet by mouth daily.   Yes [provider]  MYFORTIC 180 MG EC tablet Take 540 mg by mouth 2 (two) times daily. (brand name only)   Yes [provider]  omeprazole (PRILOSEC) 40 MG capsule Take 40 mg by mouth 2 (two) times daily.   Yes [provider]  polyethylene glycol (MIRALAX / GLYCOLAX) packet Take 17 g by mouth daily as needed for mild constipation.   Yes [provider]  predniSONE (DELTASONE) 2.5 MG tablet Take 5 mg by mouth daily.   Yes [provider]  PROGRAF 1 MG capsule Take 2-3 mg by mouth 2 (two) times daily. 3mg  in the morning and 2mg  in the evening   Yes [provider]  silver sulfADIAZINE (SILVADENE) 1 % cream Apply 1 application topically daily.   Yes [provider]  azithromycin (ZITHROMAX) 500 MG tablet Take 1 tablet (500 mg total) by mouth daily. Patient not taking: Reported on 12/02/2017 01/26/15   Katha Hamming, MD    Inpatient Medications: Scheduled Meds: . amitriptyline  25 mg Oral BID  . amLODipine  5 mg Oral Daily  . aspirin  81 mg Oral Daily  . calcium-vitamin D  1 tablet Oral Daily  . carvedilol  25 mg Oral BID WC  . doxycycline  100 mg Oral BID  . furosemide  20 mg Oral Daily  . heparin  5,000 Units Subcutaneous Q8H  . [START ON 12/03/2017] Influenza vac split quadrivalent PF  0.5 mL Intramuscular Tomorrow-1000  .  ketorolac  30 mg Intravenous Once  . mycophenolate  540 mg Oral BID  . pantoprazole  40 mg Oral BID  . predniSONE  5 mg Oral Daily  . tacrolimus  4 mg Oral BID   Continuous Infusions: . ceftAZIDime (FORTAZ) IVPB 2 gram/100 mL NS (Mini-Bag Plus)     PRN Meds: acetaminophen **OR** acetaminophen, nitroGLYCERIN, ondansetron **OR** ondansetron (ZOFRAN) IV, oxyCODONE  Allergies:    Allergies  Allergen Reactions  . Percocet [Oxycodone-Acetaminophen] Other (See Comments)    Confusion, dizziness    Social History:   Social History   Socioeconomic History  . Marital status: Single    Spouse name: Not on file  . Number of children: Not on file  .  Years of education: Not on file  . Highest education level: Not on file  Occupational History  . Not on file  Social Needs  . Financial resource strain: Not on file  . Food insecurity:    Worry: Not on file    Inability: Not on file  . Transportation needs:    Medical: Not on file    Non-medical: Not on file  Tobacco Use  . Smoking status: Never Smoker  . Smokeless tobacco: Never Used  Substance and Sexual Activity  . Alcohol use: No  . Drug use: No  . Sexual activity: Not on file  Lifestyle  . Physical activity:    Days per week: Not on file    Minutes per session: Not on file  . Stress: Not on file  Relationships  . Social connections:    Talks on phone: Not on file    Gets together: Not on file    Attends religious service: Not on file    Active member of club or organization: Not on file    Attends meetings of clubs or organizations: Not on file    Relationship status: Not on file  . Intimate partner violence:    Fear of current or ex partner: Not on file    Emotionally abused: Not on file    Physically abused: Not on file    Forced sexual activity: Not on file  Other Topics Concern  . Not on file  Social History Narrative  . Not on file    Family History:  No reported cardiac disease  Family History  Problem  Relation Age of Onset  . Diabetes Unknown   . Cancer Unknown      ROS:  Please see the history of present illness.   All other ROS reviewed and negative.     Physical Exam/Data:   Vitals:   12/02/17 0140 12/02/17 0214 12/02/17 0222 12/02/17 0533  BP: (!) 177/82 (!) 152/83 136/76 (!) 173/85  Pulse: 78 77 77 77  Resp:      Temp: 98.2 F (36.8 C)   97.6 F (36.4 C)  TempSrc: Oral   Oral  SpO2: 98% 98% 93% 93%  Weight:      Height:        Intake/Output Summary (Last 24 hours) at 12/02/2017 0811 Last data filed at 12/02/2017 0243 Gross per 24 hour  Intake 98.32 ml  Output 0 ml  Net 98.32 ml   Filed Weights   12/01/17 2303 12/02/17 0136  Weight: 72.6 kg 74.2 kg   Body mass index is 29.9 kg/m.  General:  Well nourished, well developed, in no acute distress. Somnolent on exam having just received pain medication HEENT: normal Lymph: no adenopathy Neck: no JVD Endocrine:  No thryomegaly Vascular: No carotid bruits Cardiac:  normal S1, S2; RRR; flow murmur heard on exam and likely d/t elevated BP Lungs:  clear to auscultation bilaterally, no wheezing, rhonchi or rales  Abd: soft, nontender, no hepatomegaly  Ext: RLEE>LLEE   Musculoskeletal:  No deformities, R sided LEE worse than left d/t osteomyelitis. 1+ LEE Skin: warm and dry  Neuro: no focal abnormalities noted Psych:  Normal affect   EKG: Refer to HPI Telemetry:  Telemetry was personally reviewed and demonstrates: SR 70-80s    CV Studies:   Relevant CV Studies: Pending echo today   Laboratory Data:  Chemistry Recent Labs  Lab 12/01/17 2311 12/02/17 0620  NA 143 141  K 5.5* 5.2*  CL 114* 111  CO2 23 23  GLUCOSE 126* 113*  BUN 21* 19  CREATININE 1.56* 1.41*  CALCIUM 8.4* 8.9  GFRNONAA 38* 43*  GFRAA 44* 50*  ANIONGAP 6 7    No results for input(s): PROT, ALBUMIN, AST, ALT, ALKPHOS, BILITOT in the last 168 hours. Hematology Recent Labs  Lab 12/01/17 2311 12/02/17 0620  WBC 5.7 6.6  RBC  2.72* 2.73*  HGB 7.8* 7.7*  HCT 25.0* 24.6*  MCV 91.9 90.4  MCH 28.5 28.2  MCHC 31.0* 31.2*  RDW 21.3* 20.1*  PLT 111* 91*   Cardiac Enzymes Recent Labs  Lab 12/01/17 2311 12/02/17 0620  TROPONINI <0.03 <0.03   No results for input(s): TROPIPOC in the last 168 hours.  BNPNo results for input(s): BNP, PROBNP in the last 168 hours.  DDimer No results for input(s): DDIMER in the last 168 hours.  Radiology/Studies:  Dg Chest 2 View  Result Date: 12/01/2017 CLINICAL DATA:  Central chest pain radiating to the back. Foot osteomyelitis EXAM: CHEST - 2 VIEW COMPARISON:  08/25/2016 FINDINGS: Chronic cardiomegaly. Mild interstitial coarsening. There is no edema, consolidation, effusion, or pneumothorax. Venous stenting in the right axilla and upper arm. No acute osseous finding. IMPRESSION: No evidence of active disease. Electronically Signed   By: Marnee Spring M.D.   On: 12/01/2017 23:43   Nm Pulmonary Vent And Perf (v/q Scan)  Result Date: 12/02/2017 CLINICAL DATA:  Chest pain.  PE suspected, high pretest prob EXAM: NUCLEAR MEDICINE VENTILATION - PERFUSION LUNG SCAN TECHNIQUE: Ventilation images were obtained in multiple projections using inhaled aerosol Tc-64m DTPA. Perfusion images were obtained in multiple projections after intravenous injection of Tc-2m-MAA. RADIOPHARMACEUTICALS:  32.523 mCi of Tc-36m DTPA aerosol inhalation and 4.243 mCi Tc109m-MAA IV COMPARISON:  Chest radiograph yesterday. FINDINGS: Ventilation: Heterogeneous ventilation in the right lung, ventilation more heterogeneous than perfusion. Mild central deposition of radiotracer in the trachea. Perfusion: Single small peripheral defect in the left lower lobe laterally, indicating low probability of pulmonary embolus by PIOPED II. Homogeneous perfusion to the right lung. IMPRESSION: Low probability for pulmonary embolus. Ventilation more heterogeneous than perfusion imaging, which can be seen with small airways disease.  Electronically Signed   By: Narda Rutherford M.D.   On: 12/02/2017 05:59    Assessment and Plan:   Atypical chest pain with no known h/o CAD - Still reports chest pain, rated 4/10 at the time of exam. Chest pain started s/p eating fried chicken. - Troponin negative x2. EKG without STE 80bpm, RBBB, LPFB. QTc 464.  - No patient reported h/o CAD/MI. She does report high BP following transplant and that she saw Dr. Juliann Pares once in the past for elevated BP. Previous documentation mentions CAD/MI. Unable to find these records in Epic at this time. Patient does take several cardiac medications but in setting of transplant.  - VQ scan with low probability for PE  - Echo results pending - S/p kidney transplant  - Received SL nitro, oxy, morphine with little relief. Relief reported s/p Toradol - Continue heparin. Daily CBC. Continue ASA, Coreg 25mg  BID, amlodipine 5mg , SL nitro, toradol, morphine, oxy IR.  - GI versus cardiac. Atypical chest pain with risk factors for cardiac disease including HTN, CKD s/p transplant. DM2 - Recommend inpatient or outpatient stress test for further ischemic evaluation given risk factors - at this time rules out for acute coronary syndrome with no troponin elevation or changes on EKG and atypical chest pain with ddx to include GI etiology.  - Recommend continue home asa,  bb, amlodipine, statin. Caution with continuing diuretic as s/p transplant  Hyperkalemia  - Monitor - K 5.5 - Recommend check Mg  HTN, uncontrolled  - BP elevated at 173/85, HR high 70s-80s - Continue coreg, amlodipine - Consider increasing coreg or amlodipine for better BP and HR control.  - Home diuretic - caution as s/p transplant   HLD - LDL 110 with goal <70 - Recommend recheck liver function - Continue home statin  CKD s/p kidney transplant and likely anemia of chronic disease - Caution with nephrotoxic medications and procedures. Caution with diuretic given s/p transplant  - Anemia of  chronic disease likely with Hgb 7.8. Has reportedly required transfusions in the past but remains baseline ~8 - Per IM  GERD - Continue GI cocktail, protonix - Consider GI etiology of chest pain  - Denies changes in stool, urination - Per IM  DM2 with complications - R foot osteomyelitis as above - SSI - Per IM    For questions or updates, please contact [facility] Please consult [email or web address] for contact info under     Signed, Lennon Alstrom, PA-C  12/02/2017 8:11 AM

## 2017-12-03 ENCOUNTER — Encounter: Admit: 2017-12-03 | Discharge: 2017-12-04 | Payer: MEDICAID

## 2017-12-03 DIAGNOSIS — L97412 Non-pressure chronic ulcer of right heel and midfoot with fat layer exposed: Secondary | ICD-10-CM

## 2017-12-03 DIAGNOSIS — D631 Anemia in chronic kidney disease: Secondary | ICD-10-CM

## 2017-12-03 DIAGNOSIS — E11610 Type 2 diabetes mellitus with diabetic neuropathic arthropathy: Secondary | ICD-10-CM

## 2017-12-03 DIAGNOSIS — I129 Hypertensive chronic kidney disease with stage 1 through stage 4 chronic kidney disease, or unspecified chronic kidney disease: Secondary | ICD-10-CM

## 2017-12-03 DIAGNOSIS — K3184 Gastroparesis: Secondary | ICD-10-CM

## 2017-12-03 DIAGNOSIS — E11319 Type 2 diabetes mellitus with unspecified diabetic retinopathy without macular edema: Secondary | ICD-10-CM

## 2017-12-03 DIAGNOSIS — E1142 Type 2 diabetes mellitus with diabetic polyneuropathy: Secondary | ICD-10-CM

## 2017-12-03 DIAGNOSIS — M1611 Unilateral primary osteoarthritis, right hip: Secondary | ICD-10-CM

## 2017-12-03 DIAGNOSIS — Z940 Kidney transplant status: Principal | ICD-10-CM

## 2017-12-03 DIAGNOSIS — E669 Obesity, unspecified: Secondary | ICD-10-CM

## 2017-12-03 DIAGNOSIS — K224 Dyskinesia of esophagus: Secondary | ICD-10-CM

## 2017-12-03 DIAGNOSIS — E1143 Type 2 diabetes mellitus with diabetic autonomic (poly)neuropathy: Secondary | ICD-10-CM

## 2017-12-03 DIAGNOSIS — E875 Hyperkalemia: Secondary | ICD-10-CM

## 2017-12-03 DIAGNOSIS — Z792 Long term (current) use of antibiotics: Secondary | ICD-10-CM

## 2017-12-03 DIAGNOSIS — M19012 Primary osteoarthritis, left shoulder: Secondary | ICD-10-CM

## 2017-12-03 DIAGNOSIS — E11621 Type 2 diabetes mellitus with foot ulcer: Principal | ICD-10-CM

## 2017-12-03 DIAGNOSIS — M869 Osteomyelitis, unspecified: Secondary | ICD-10-CM

## 2017-12-03 DIAGNOSIS — N183 Chronic kidney disease, stage 3 (moderate): Secondary | ICD-10-CM

## 2017-12-03 DIAGNOSIS — E1151 Type 2 diabetes mellitus with diabetic peripheral angiopathy without gangrene: Secondary | ICD-10-CM

## 2017-12-03 DIAGNOSIS — E1122 Type 2 diabetes mellitus with diabetic chronic kidney disease: Secondary | ICD-10-CM

## 2017-12-03 DIAGNOSIS — E785 Hyperlipidemia, unspecified: Secondary | ICD-10-CM

## 2017-12-03 DIAGNOSIS — E1169 Type 2 diabetes mellitus with other specified complication: Secondary | ICD-10-CM

## 2017-12-03 DIAGNOSIS — K219 Gastro-esophageal reflux disease without esophagitis: Secondary | ICD-10-CM

## 2017-12-03 DIAGNOSIS — M8580 Other specified disorders of bone density and structure, unspecified site: Secondary | ICD-10-CM

## 2017-12-03 DIAGNOSIS — R1310 Dysphagia, unspecified: Secondary | ICD-10-CM

## 2017-12-03 DIAGNOSIS — Z89421 Acquired absence of other right toe(s): Secondary | ICD-10-CM

## 2017-12-03 LAB — HIV ANTIBODY (ROUTINE TESTING W REFLEX): HIV SCREEN 4TH GENERATION: NONREACTIVE

## 2017-12-05 LAB — BPAM RBC
Blood Product Expiration Date: 201910252359
Blood Product Expiration Date: 201910302359
Unit Type and Rh: 6200
Unit Type and Rh: 6200

## 2017-12-05 LAB — TYPE AND SCREEN
ABO/RH(D): A POS
Antibody Screen: NEGATIVE
UNIT DIVISION: 0
UNIT DIVISION: 0

## 2017-12-10 ENCOUNTER — Encounter: Admit: 2017-12-10 | Discharge: 2017-12-10 | Payer: MEDICAID

## 2017-12-10 ENCOUNTER — Encounter: Admit: 2017-12-10 | Discharge: 2017-12-10 | Payer: MEDICAID | Attending: Nephrology | Primary: Nephrology

## 2017-12-10 DIAGNOSIS — E1142 Type 2 diabetes mellitus with diabetic polyneuropathy: Secondary | ICD-10-CM

## 2017-12-10 DIAGNOSIS — D631 Anemia in chronic kidney disease: Secondary | ICD-10-CM

## 2017-12-10 DIAGNOSIS — Z89421 Acquired absence of other right toe(s): Secondary | ICD-10-CM

## 2017-12-10 DIAGNOSIS — L97412 Non-pressure chronic ulcer of right heel and midfoot with fat layer exposed: Secondary | ICD-10-CM

## 2017-12-10 DIAGNOSIS — I129 Hypertensive chronic kidney disease with stage 1 through stage 4 chronic kidney disease, or unspecified chronic kidney disease: Secondary | ICD-10-CM

## 2017-12-10 DIAGNOSIS — M1611 Unilateral primary osteoarthritis, right hip: Secondary | ICD-10-CM

## 2017-12-10 DIAGNOSIS — E875 Hyperkalemia: Secondary | ICD-10-CM

## 2017-12-10 DIAGNOSIS — Z9483 Pancreas transplant status: Secondary | ICD-10-CM

## 2017-12-10 DIAGNOSIS — Z792 Long term (current) use of antibiotics: Secondary | ICD-10-CM

## 2017-12-10 DIAGNOSIS — N183 Chronic kidney disease, stage 3 (moderate): Secondary | ICD-10-CM

## 2017-12-10 DIAGNOSIS — R1310 Dysphagia, unspecified: Secondary | ICD-10-CM

## 2017-12-10 DIAGNOSIS — E11319 Type 2 diabetes mellitus with unspecified diabetic retinopathy without macular edema: Secondary | ICD-10-CM

## 2017-12-10 DIAGNOSIS — M19012 Primary osteoarthritis, left shoulder: Secondary | ICD-10-CM

## 2017-12-10 DIAGNOSIS — M8580 Other specified disorders of bone density and structure, unspecified site: Secondary | ICD-10-CM

## 2017-12-10 DIAGNOSIS — E669 Obesity, unspecified: Secondary | ICD-10-CM

## 2017-12-10 DIAGNOSIS — E11621 Type 2 diabetes mellitus with foot ulcer: Principal | ICD-10-CM

## 2017-12-10 DIAGNOSIS — I10 Essential (primary) hypertension: Secondary | ICD-10-CM

## 2017-12-10 DIAGNOSIS — E11610 Type 2 diabetes mellitus with diabetic neuropathic arthropathy: Secondary | ICD-10-CM

## 2017-12-10 DIAGNOSIS — K3184 Gastroparesis: Secondary | ICD-10-CM

## 2017-12-10 DIAGNOSIS — Z79899 Other long term (current) drug therapy: Principal | ICD-10-CM

## 2017-12-10 DIAGNOSIS — N182 Chronic kidney disease, stage 2 (mild): Secondary | ICD-10-CM

## 2017-12-10 DIAGNOSIS — E1143 Type 2 diabetes mellitus with diabetic autonomic (poly)neuropathy: Secondary | ICD-10-CM

## 2017-12-10 DIAGNOSIS — K224 Dyskinesia of esophagus: Secondary | ICD-10-CM

## 2017-12-10 DIAGNOSIS — E785 Hyperlipidemia, unspecified: Secondary | ICD-10-CM

## 2017-12-10 DIAGNOSIS — Z940 Kidney transplant status: Secondary | ICD-10-CM

## 2017-12-10 DIAGNOSIS — E1169 Type 2 diabetes mellitus with other specified complication: Secondary | ICD-10-CM

## 2017-12-10 DIAGNOSIS — D899 Disorder involving the immune mechanism, unspecified: Secondary | ICD-10-CM

## 2017-12-10 DIAGNOSIS — E1151 Type 2 diabetes mellitus with diabetic peripheral angiopathy without gangrene: Secondary | ICD-10-CM

## 2017-12-10 DIAGNOSIS — K219 Gastro-esophageal reflux disease without esophagitis: Secondary | ICD-10-CM

## 2017-12-10 DIAGNOSIS — M869 Osteomyelitis, unspecified: Secondary | ICD-10-CM

## 2017-12-10 DIAGNOSIS — E1122 Type 2 diabetes mellitus with diabetic chronic kidney disease: Secondary | ICD-10-CM

## 2017-12-10 MED ORDER — SODIUM POLYSTYRENE SULFONATE 15 GRAM/60 ML ORAL SUSPENSION
Freq: Once | ORAL | 0 refills | 0.00000 days | Status: CP
Start: 2017-12-10 — End: 2017-12-10

## 2017-12-10 MED ORDER — AMLODIPINE 5 MG TABLET
ORAL_TABLET | Freq: Every day | ORAL | 11 refills | 0.00000 days
Start: 2017-12-10 — End: 2018-01-06

## 2017-12-10 MED ORDER — TACROLIMUS 1 MG CAPSULE
ORAL_CAPSULE | 2 refills | 0 days
Start: 2017-12-10 — End: 2018-10-02

## 2017-12-13 ENCOUNTER — Institutional Professional Consult (permissible substitution): Admit: 2017-12-13 | Discharge: 2017-12-13 | Payer: MEDICAID

## 2017-12-13 ENCOUNTER — Ambulatory Visit
Admit: 2017-12-13 | Discharge: 2017-12-13 | Payer: MEDICAID | Attending: Infectious Disease | Primary: Infectious Disease

## 2017-12-13 DIAGNOSIS — Z9483 Pancreas transplant status: Secondary | ICD-10-CM

## 2017-12-13 DIAGNOSIS — Z940 Kidney transplant status: Principal | ICD-10-CM

## 2017-12-13 DIAGNOSIS — Z79899 Other long term (current) drug therapy: Secondary | ICD-10-CM

## 2017-12-13 DIAGNOSIS — M86671 Other chronic osteomyelitis, right ankle and foot: Secondary | ICD-10-CM

## 2017-12-17 ENCOUNTER — Other Ambulatory Visit: Admit: 2017-12-17 | Discharge: 2017-12-18 | Payer: MEDICAID

## 2017-12-17 DIAGNOSIS — K219 Gastro-esophageal reflux disease without esophagitis: Secondary | ICD-10-CM

## 2017-12-17 DIAGNOSIS — M869 Osteomyelitis, unspecified: Secondary | ICD-10-CM

## 2017-12-17 DIAGNOSIS — N183 Chronic kidney disease, stage 3 (moderate): Secondary | ICD-10-CM

## 2017-12-17 DIAGNOSIS — M19012 Primary osteoarthritis, left shoulder: Secondary | ICD-10-CM

## 2017-12-17 DIAGNOSIS — E669 Obesity, unspecified: Secondary | ICD-10-CM

## 2017-12-17 DIAGNOSIS — M1611 Unilateral primary osteoarthritis, right hip: Secondary | ICD-10-CM

## 2017-12-17 DIAGNOSIS — Z89421 Acquired absence of other right toe(s): Secondary | ICD-10-CM

## 2017-12-17 DIAGNOSIS — E11319 Type 2 diabetes mellitus with unspecified diabetic retinopathy without macular edema: Secondary | ICD-10-CM

## 2017-12-17 DIAGNOSIS — E875 Hyperkalemia: Secondary | ICD-10-CM

## 2017-12-17 DIAGNOSIS — E1143 Type 2 diabetes mellitus with diabetic autonomic (poly)neuropathy: Secondary | ICD-10-CM

## 2017-12-17 DIAGNOSIS — E1122 Type 2 diabetes mellitus with diabetic chronic kidney disease: Secondary | ICD-10-CM

## 2017-12-17 DIAGNOSIS — E1142 Type 2 diabetes mellitus with diabetic polyneuropathy: Secondary | ICD-10-CM

## 2017-12-17 DIAGNOSIS — R1310 Dysphagia, unspecified: Secondary | ICD-10-CM

## 2017-12-17 DIAGNOSIS — Z792 Long term (current) use of antibiotics: Secondary | ICD-10-CM

## 2017-12-17 DIAGNOSIS — E785 Hyperlipidemia, unspecified: Secondary | ICD-10-CM

## 2017-12-17 DIAGNOSIS — L97412 Non-pressure chronic ulcer of right heel and midfoot with fat layer exposed: Secondary | ICD-10-CM

## 2017-12-17 DIAGNOSIS — I129 Hypertensive chronic kidney disease with stage 1 through stage 4 chronic kidney disease, or unspecified chronic kidney disease: Secondary | ICD-10-CM

## 2017-12-17 DIAGNOSIS — E1169 Type 2 diabetes mellitus with other specified complication: Secondary | ICD-10-CM

## 2017-12-17 DIAGNOSIS — E11610 Type 2 diabetes mellitus with diabetic neuropathic arthropathy: Secondary | ICD-10-CM

## 2017-12-17 DIAGNOSIS — M8580 Other specified disorders of bone density and structure, unspecified site: Secondary | ICD-10-CM

## 2017-12-17 DIAGNOSIS — E11621 Type 2 diabetes mellitus with foot ulcer: Principal | ICD-10-CM

## 2017-12-17 DIAGNOSIS — E1151 Type 2 diabetes mellitus with diabetic peripheral angiopathy without gangrene: Secondary | ICD-10-CM

## 2017-12-17 DIAGNOSIS — K3184 Gastroparesis: Secondary | ICD-10-CM

## 2017-12-17 DIAGNOSIS — K224 Dyskinesia of esophagus: Secondary | ICD-10-CM

## 2017-12-17 DIAGNOSIS — L709 Acne, unspecified: Secondary | ICD-10-CM

## 2017-12-17 DIAGNOSIS — D631 Anemia in chronic kidney disease: Secondary | ICD-10-CM

## 2017-12-17 DIAGNOSIS — M659 Synovitis and tenosynovitis, unspecified: Secondary | ICD-10-CM

## 2017-12-17 DIAGNOSIS — L403 Pustulosis palmaris et plantaris: Secondary | ICD-10-CM

## 2017-12-17 DIAGNOSIS — L97509 Non-pressure chronic ulcer of other part of unspecified foot with unspecified severity: Secondary | ICD-10-CM

## 2017-12-20 ENCOUNTER — Encounter: Admit: 2017-12-20 | Discharge: 2017-12-20 | Payer: MEDICAID

## 2017-12-20 DIAGNOSIS — M86671 Other chronic osteomyelitis, right ankle and foot: Principal | ICD-10-CM

## 2017-12-23 ENCOUNTER — Encounter: Admit: 2017-12-23 | Discharge: 2017-12-24 | Payer: MEDICAID

## 2017-12-23 ENCOUNTER — Encounter: Admit: 2017-12-23 | Discharge: 2017-12-24 | Payer: MEDICAID | Attending: Podiatrist | Primary: Podiatrist

## 2017-12-23 DIAGNOSIS — L97412 Non-pressure chronic ulcer of right heel and midfoot with fat layer exposed: Secondary | ICD-10-CM

## 2017-12-23 DIAGNOSIS — M86671 Other chronic osteomyelitis, right ankle and foot: Principal | ICD-10-CM

## 2017-12-23 DIAGNOSIS — E11610 Type 2 diabetes mellitus with diabetic neuropathic arthropathy: Principal | ICD-10-CM

## 2017-12-23 DIAGNOSIS — E11621 Type 2 diabetes mellitus with foot ulcer: Secondary | ICD-10-CM

## 2017-12-23 DIAGNOSIS — M14671 Charcot's joint, right ankle and foot: Secondary | ICD-10-CM

## 2017-12-23 MED ORDER — PROGRAF 1 MG CAPSULE
ORAL_CAPSULE | Freq: Two times a day (BID) | ORAL | 5 refills | 0 days | Status: CP
Start: 2017-12-23 — End: 2018-01-27
  Filled 2017-12-24: qty 240, 30d supply, fill #0

## 2017-12-24 ENCOUNTER — Ambulatory Visit: Payer: Medicare Other | Admitting: Physician Assistant

## 2017-12-24 MED FILL — MYFORTIC 180 MG TABLET,DELAYED RELEASE: ORAL | 30 days supply | Qty: 180 | Fill #2

## 2017-12-24 MED FILL — MYFORTIC 180 MG TABLET,DELAYED RELEASE: 30 days supply | Qty: 180 | Fill #2 | Status: AC

## 2017-12-24 MED FILL — PROGRAF 1 MG CAPSULE: 30 days supply | Qty: 240 | Fill #0 | Status: AC

## 2018-01-06 ENCOUNTER — Encounter: Admit: 2018-01-06 | Discharge: 2018-01-07 | Payer: MEDICAID | Attending: Podiatrist | Primary: Podiatrist

## 2018-01-06 DIAGNOSIS — Z940 Kidney transplant status: Secondary | ICD-10-CM

## 2018-01-06 DIAGNOSIS — E11610 Type 2 diabetes mellitus with diabetic neuropathic arthropathy: Principal | ICD-10-CM

## 2018-01-06 DIAGNOSIS — L97412 Non-pressure chronic ulcer of right heel and midfoot with fat layer exposed: Secondary | ICD-10-CM

## 2018-01-06 DIAGNOSIS — B353 Tinea pedis: Secondary | ICD-10-CM

## 2018-01-06 DIAGNOSIS — M86671 Other chronic osteomyelitis, right ankle and foot: Secondary | ICD-10-CM

## 2018-01-06 DIAGNOSIS — M14671 Charcot's joint, right ankle and foot: Secondary | ICD-10-CM

## 2018-01-06 DIAGNOSIS — E11621 Type 2 diabetes mellitus with foot ulcer: Secondary | ICD-10-CM

## 2018-01-06 MED ORDER — KETOCONAZOLE 2 % TOPICAL CREAM
Freq: Every day | TOPICAL | 0 refills | 0 days | Status: CP
Start: 2018-01-06 — End: 2018-02-05

## 2018-01-07 MED ORDER — AMLODIPINE 5 MG TABLET
1 refills | 0 days | Status: CP
Start: 2018-01-07 — End: 2018-01-08

## 2018-01-07 MED ORDER — CARVEDILOL 25 MG TABLET
1 refills | 0 days | Status: CP
Start: 2018-01-07 — End: 2018-01-08

## 2018-01-08 MED ORDER — AMLODIPINE 5 MG TABLET
Freq: Every day | ORAL | 3 refills | 0 days | Status: CP
Start: 2018-01-08 — End: ?

## 2018-01-08 MED ORDER — CARVEDILOL 25 MG TABLET
Freq: Two times a day (BID) | ORAL | 3 refills | 0 days | Status: CP
Start: 2018-01-08 — End: ?

## 2018-01-20 ENCOUNTER — Encounter: Admit: 2018-01-20 | Discharge: 2018-01-20 | Payer: MEDICAID

## 2018-01-20 ENCOUNTER — Encounter: Admit: 2018-01-20 | Discharge: 2018-01-20 | Payer: MEDICAID | Attending: Podiatrist | Primary: Podiatrist

## 2018-01-20 DIAGNOSIS — L97411 Non-pressure chronic ulcer of right heel and midfoot limited to breakdown of skin: Secondary | ICD-10-CM

## 2018-01-20 DIAGNOSIS — Z940 Kidney transplant status: Secondary | ICD-10-CM

## 2018-01-20 DIAGNOSIS — N182 Chronic kidney disease, stage 2 (mild): Principal | ICD-10-CM

## 2018-01-20 DIAGNOSIS — D631 Anemia in chronic kidney disease: Secondary | ICD-10-CM

## 2018-01-20 DIAGNOSIS — E11610 Type 2 diabetes mellitus with diabetic neuropathic arthropathy: Principal | ICD-10-CM

## 2018-01-20 DIAGNOSIS — Z9483 Pancreas transplant status: Secondary | ICD-10-CM

## 2018-01-20 DIAGNOSIS — Z79899 Other long term (current) drug therapy: Secondary | ICD-10-CM

## 2018-01-20 DIAGNOSIS — E11621 Type 2 diabetes mellitus with foot ulcer: Secondary | ICD-10-CM

## 2018-01-27 ENCOUNTER — Encounter: Admit: 2018-01-27 | Discharge: 2018-01-28 | Payer: MEDICAID | Attending: Podiatrist | Primary: Podiatrist

## 2018-01-27 DIAGNOSIS — L97411 Non-pressure chronic ulcer of right heel and midfoot limited to breakdown of skin: Secondary | ICD-10-CM

## 2018-01-27 DIAGNOSIS — Z940 Kidney transplant status: Secondary | ICD-10-CM

## 2018-01-27 DIAGNOSIS — E11621 Type 2 diabetes mellitus with foot ulcer: Principal | ICD-10-CM

## 2018-01-27 DIAGNOSIS — E11610 Type 2 diabetes mellitus with diabetic neuropathic arthropathy: Secondary | ICD-10-CM

## 2018-01-27 MED ORDER — BECAPLERMIN 0.01 % TOPICAL GEL
Freq: Every morning | TOPICAL | 3 refills | 0.00000 days | Status: CP
Start: 2018-01-27 — End: ?

## 2018-01-27 MED ORDER — MYFORTIC 180 MG TABLET,DELAYED RELEASE
ORAL_TABLET | ORAL | 5 refills | 0 days | Status: CP
Start: 2018-01-27 — End: 2018-07-16
  Filled 2018-01-30: qty 180, 30d supply, fill #0

## 2018-01-27 MED ORDER — PROGRAF 1 MG CAPSULE
ORAL_CAPSULE | Freq: Two times a day (BID) | ORAL | 5 refills | 0 days | Status: CP
Start: 2018-01-27 — End: 2018-01-28
  Filled 2018-01-30: qty 150, 30d supply, fill #0

## 2018-01-28 MED ORDER — PROGRAF 1 MG CAPSULE
ORAL_CAPSULE | 5 refills | 0 days | Status: CP
Start: 2018-01-28 — End: 2018-05-28

## 2018-01-30 MED FILL — MYFORTIC 180 MG TABLET,DELAYED RELEASE: 30 days supply | Qty: 180 | Fill #0 | Status: AC

## 2018-01-30 MED FILL — PROGRAF 1 MG CAPSULE: 30 days supply | Qty: 150 | Fill #0 | Status: AC

## 2018-02-03 ENCOUNTER — Encounter: Admit: 2018-02-03 | Discharge: 2018-02-04 | Payer: MEDICAID | Attending: Podiatrist | Primary: Podiatrist

## 2018-02-03 DIAGNOSIS — M14671 Charcot's joint, right ankle and foot: Secondary | ICD-10-CM

## 2018-02-03 DIAGNOSIS — Z940 Kidney transplant status: Secondary | ICD-10-CM

## 2018-02-03 DIAGNOSIS — L97413 Non-pressure chronic ulcer of right heel and midfoot with necrosis of muscle: Secondary | ICD-10-CM

## 2018-02-03 DIAGNOSIS — E10621 Type 1 diabetes mellitus with foot ulcer: Principal | ICD-10-CM

## 2018-02-04 ENCOUNTER — Encounter: Admit: 2018-02-04 | Discharge: 2018-02-04 | Payer: MEDICAID | Attending: Nephrology | Primary: Nephrology

## 2018-02-04 ENCOUNTER — Encounter: Admit: 2018-02-04 | Discharge: 2018-02-04 | Payer: MEDICAID

## 2018-02-04 DIAGNOSIS — Z9483 Pancreas transplant status: Secondary | ICD-10-CM

## 2018-02-04 DIAGNOSIS — D631 Anemia in chronic kidney disease: Secondary | ICD-10-CM

## 2018-02-04 DIAGNOSIS — N182 Chronic kidney disease, stage 2 (mild): Secondary | ICD-10-CM

## 2018-02-04 DIAGNOSIS — N183 Chronic kidney disease, stage 3 (moderate): Secondary | ICD-10-CM

## 2018-02-04 DIAGNOSIS — Z940 Kidney transplant status: Principal | ICD-10-CM

## 2018-02-04 DIAGNOSIS — D899 Disorder involving the immune mechanism, unspecified: Secondary | ICD-10-CM

## 2018-02-04 DIAGNOSIS — Z79899 Other long term (current) drug therapy: Secondary | ICD-10-CM

## 2018-02-04 DIAGNOSIS — I10 Essential (primary) hypertension: Secondary | ICD-10-CM

## 2018-02-10 ENCOUNTER — Encounter: Admit: 2018-02-10 | Discharge: 2018-02-11 | Payer: MEDICAID | Attending: Podiatrist | Primary: Podiatrist

## 2018-02-10 DIAGNOSIS — M14671 Charcot's joint, right ankle and foot: Secondary | ICD-10-CM

## 2018-02-10 DIAGNOSIS — E10621 Type 1 diabetes mellitus with foot ulcer: Principal | ICD-10-CM

## 2018-02-10 DIAGNOSIS — L97413 Non-pressure chronic ulcer of right heel and midfoot with necrosis of muscle: Secondary | ICD-10-CM

## 2018-02-10 MED ORDER — SILVER SULFADIAZINE 1 % TOPICAL CREAM
Freq: Every day | TOPICAL | 0 refills | 0 days | Status: CP
Start: 2018-02-10 — End: 2018-06-11

## 2018-02-17 ENCOUNTER — Encounter: Admit: 2018-02-17 | Discharge: 2018-02-18 | Payer: MEDICAID | Attending: Podiatrist | Primary: Podiatrist

## 2018-02-17 DIAGNOSIS — Z940 Kidney transplant status: Secondary | ICD-10-CM

## 2018-02-17 DIAGNOSIS — E10621 Type 1 diabetes mellitus with foot ulcer: Principal | ICD-10-CM

## 2018-02-17 DIAGNOSIS — L97413 Non-pressure chronic ulcer of right heel and midfoot with necrosis of muscle: Secondary | ICD-10-CM

## 2018-02-17 DIAGNOSIS — M14671 Charcot's joint, right ankle and foot: Secondary | ICD-10-CM

## 2018-02-27 MED FILL — PROGRAF 1 MG CAPSULE: 30 days supply | Qty: 150 | Fill #1 | Status: AC

## 2018-02-27 MED FILL — MYFORTIC 180 MG TABLET,DELAYED RELEASE: ORAL | 30 days supply | Qty: 180 | Fill #1

## 2018-02-27 MED FILL — MYFORTIC 180 MG TABLET,DELAYED RELEASE: 30 days supply | Qty: 180 | Fill #1 | Status: AC

## 2018-02-27 MED FILL — PROGRAF 1 MG CAPSULE: 30 days supply | Qty: 150 | Fill #1

## 2018-02-28 ENCOUNTER — Ambulatory Visit: Admit: 2018-02-28 | Discharge: 2018-03-01 | Payer: MEDICAID

## 2018-02-28 DIAGNOSIS — Z940 Kidney transplant status: Principal | ICD-10-CM

## 2018-03-03 ENCOUNTER — Encounter: Admit: 2018-03-03 | Discharge: 2018-03-03 | Payer: MEDICAID | Attending: Podiatrist | Primary: Podiatrist

## 2018-03-03 DIAGNOSIS — E10621 Type 1 diabetes mellitus with foot ulcer: Secondary | ICD-10-CM

## 2018-03-03 DIAGNOSIS — Z940 Kidney transplant status: Secondary | ICD-10-CM

## 2018-03-03 DIAGNOSIS — L97413 Non-pressure chronic ulcer of right heel and midfoot with necrosis of muscle: Secondary | ICD-10-CM

## 2018-03-03 DIAGNOSIS — M14671 Charcot's joint, right ankle and foot: Secondary | ICD-10-CM

## 2018-03-03 DIAGNOSIS — E11621 Type 2 diabetes mellitus with foot ulcer: Principal | ICD-10-CM

## 2018-03-03 DIAGNOSIS — L97519 Non-pressure chronic ulcer of other part of right foot with unspecified severity: Secondary | ICD-10-CM

## 2018-03-05 MED ORDER — AMOXICILLIN 500 MG-POTASSIUM CLAVULANATE 125 MG TABLET
ORAL_TABLET | Freq: Two times a day (BID) | ORAL | 0 refills | 0.00000 days | Status: CP
Start: 2018-03-05 — End: 2018-04-23

## 2018-03-10 ENCOUNTER — Other Ambulatory Visit (INDEPENDENT_AMBULATORY_CARE_PROVIDER_SITE_OTHER): Payer: Self-pay | Admitting: Vascular Surgery

## 2018-03-10 ENCOUNTER — Telehealth (INDEPENDENT_AMBULATORY_CARE_PROVIDER_SITE_OTHER): Payer: Self-pay | Admitting: Vascular Surgery

## 2018-03-10 DIAGNOSIS — M79661 Pain in right lower leg: Secondary | ICD-10-CM

## 2018-03-10 DIAGNOSIS — M79662 Pain in left lower leg: Principal | ICD-10-CM

## 2018-03-10 NOTE — Telephone Encounter (Signed)
See below

## 2018-03-10 NOTE — Telephone Encounter (Signed)
A new patient appointment can be made with an ABI.

## 2018-03-10 NOTE — Telephone Encounter (Signed)
I see no prior appointments in Epic, a patient becomes a new patient once not seen in three years though. Please advise on the steps moving forward.

## 2018-03-11 ENCOUNTER — Encounter (INDEPENDENT_AMBULATORY_CARE_PROVIDER_SITE_OTHER): Payer: Self-pay | Admitting: Vascular Surgery

## 2018-03-11 ENCOUNTER — Ambulatory Visit (INDEPENDENT_AMBULATORY_CARE_PROVIDER_SITE_OTHER): Payer: Medicare Other | Admitting: Vascular Surgery

## 2018-03-11 ENCOUNTER — Ambulatory Visit (INDEPENDENT_AMBULATORY_CARE_PROVIDER_SITE_OTHER): Payer: Medicare Other

## 2018-03-11 VITALS — BP 122/71 | HR 72 | Resp 16 | Ht 62.0 in | Wt 167.2 lb

## 2018-03-11 DIAGNOSIS — N183 Chronic kidney disease, stage 3 unspecified: Secondary | ICD-10-CM

## 2018-03-11 DIAGNOSIS — I70222 Atherosclerosis of native arteries of extremities with rest pain, left leg: Secondary | ICD-10-CM | POA: Diagnosis not present

## 2018-03-11 DIAGNOSIS — M79662 Pain in left lower leg: Secondary | ICD-10-CM

## 2018-03-11 DIAGNOSIS — M79661 Pain in right lower leg: Secondary | ICD-10-CM | POA: Diagnosis not present

## 2018-03-11 DIAGNOSIS — E1169 Type 2 diabetes mellitus with other specified complication: Secondary | ICD-10-CM | POA: Diagnosis not present

## 2018-03-11 NOTE — Progress Notes (Signed)
Subjective:    Patient ID: Kendra Schroeder, female    DOB: 09/14/1970, 48 y.o.   MRN: [id number] Chief Complaint  Patient presents with  . Follow-up  . Leg Pain    left    Patient has not been seen in over 3 years.  The patient was seen in our office in the past for dialysis access surveillance however has since received a transplanted kidney.  The [facility].  The patient notes a history of "back issues".  Patient states that she has "sciatica".  Patient notes that this past Saturday, the pain radiating from her left hip to her knee had worsened.  The patient presents today to rule out any vascular issues that may be contributing.  The patient's discomfort is stable.  The patient's pain occurs with and without activity.  The patient denies any rest pain or ulcer formation to the bilateral lower extremity. No symptoms to her right lower extremity.  The patient underwent a bilateral ABI which was notable for right: 1.31, Biphasic tibials.  No great toe waveform was performed due to a previous amputation. Left: Non-compressible, biphasic anterior tibial, monophasic posterior tibial with normal digit great big toe waveforms.  The patient denies any fever, nausea vomiting.   Review of Systems  Constitutional: Negative.   HENT: Negative.   Eyes: Negative.   Respiratory: Negative.   Cardiovascular:       Left Lower Extremity Pain  Gastrointestinal: Negative.   Endocrine: Negative.   Genitourinary: Negative.   Musculoskeletal: Negative.   Skin: Negative.   Allergic/Immunologic: Negative.   Neurological: Negative.   Hematological: Negative.   Psychiatric/Behavioral: Negative.       Objective:   Physical Exam Constitutional:      Appearance: Normal appearance.  HENT:     Head: Normocephalic and atraumatic.     Right Ear: External ear normal.     Left Ear: External ear normal.     Nose: Nose normal.     Mouth/Throat:     Mouth: Mucous membranes are dry.     Pharynx: Oropharynx is clear.  Eyes:     Conjunctiva/sclera: Conjunctivae normal.     Pupils: Pupils are equal, round, and reactive to light.  Neck:     Musculoskeletal: Normal range of motion.  Cardiovascular:     Rate and Rhythm: Normal rate and regular rhythm.     Comments: Faintly palpable pedal pulses bilaterally.  The extremities are warm. Pulmonary:     Effort: Pulmonary effort is normal.     Breath sounds: Normal breath sounds.  Musculoskeletal: Normal range of motion.        General: Swelling (Mild nonpitting edema noted bilaterally) present.  Skin:    General: Skin is warm and dry.  Neurological:     General: No focal deficit present.     Mental Status: She is alert and oriented to person, place, and time. Mental status is at baseline.  Psychiatric:        Mood and Affect: Mood normal.        Behavior: Behavior normal.        Thought Content: Thought content normal.        Judgment: Judgment normal.    BP 122/71 (BP Location: Right Arm, Patient Position: Sitting, Cuff Size: Normal)   Pulse 72   Resp 16   Ht 5\' 2"  (1.575 m)   Wt 167 lb 3.2 oz (75.8 kg)   BMI 30.58 kg/m  Past Medical History:  Diagnosis Date  . CKD (chronic kidney disease), stage III (HCC)   . Diabetes mellitus without complication (HCC)   . GERD (gastroesophageal reflux disease)   . Hypertension   . Kidney transplant recipient   . Myocardial infarct [facility](HCC)    Social History   Socioeconomic History  . Marital status: Single    Spouse name: Not on file  . Number of children: Not on file  . Years of education: Not on file  . Highest education level: Not on file  Occupational History  . Not on file  Social Needs  . Financial resource strain: Not on file  . Food insecurity:    Worry: Not on file    Inability: Not on file  . Transportation needs:    Medical: Not on file    Non-medical: Not on file  Tobacco Use  . Smoking status: Never Smoker  . Smokeless tobacco: Never Used    Substance and Sexual Activity  . Alcohol use: No  . Drug use: No  . Sexual activity: Not on file  Lifestyle  . Physical activity:    Days per week: Not on file    Minutes per session: Not on file  . Stress: Not on file  Relationships  . Social connections:    Talks on phone: Not on file    Gets together: Not on file    Attends religious service: Not on file    Active member of club or organization: Not on file    Attends meetings of clubs or organizations: Not on file    Relationship status: Not on file  . Intimate partner violence:    Fear of current or ex partner: Not on file    Emotionally abused: Not on file    Physically abused: Not on file    Forced sexual activity: Not on file  Other Topics Concern  . Not on file  Social History Narrative  . Not on file   Past Surgical History:  Procedure Laterality Date  . COMBINED KIDNEY-PANCREAS TRANSPLANT    . EYE SURGERY    . NEPHRECTOMY TRANSPLANTED ORGAN    . TOE AMPUTATION     Family History  Problem Relation Age of Onset  . Diabetes Unknown   . Cancer Unknown    Allergies  Allergen Reactions  . Cefepime Other (See Comments)    Altered mental status, aphasia  . Percocet [Oxycodone-Acetaminophen] Other (See Comments)    Confusion, dizziness      Assessment & Plan:   Patient has not been seen in over 3 years.  The patient was seen in our office in the past for dialysis access surveillance however has since received a transplanted kidney.  [facility].  The patient notes a history of "back issues".  Patient states that she has "sciatica".  Patient notes that this past Saturday, the pain radiating from her left hip to her knee had worsened.  The patient presents today to rule out any vascular issues that may be contributing.  The patient's discomfort is stable.  The patient's pain occurs with and without activity.  The patient denies any rest pain or ulcer formation to the bilateral lower  extremity. No symptoms to her right lower extremity.  The patient underwent a bilateral ABI which was notable for right: 1.31, Biphasic tibials.  No great toe waveform was performed due to a previous amputation. Left: Non-compressible, biphasic anterior tibial, monophasic posterior tibial  with normal digit great big toe waveforms.  The patient denies any fever, nausea vomiting.   1. 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 The patient does walk with a cane. I feel that this is an exacerbation of her degenerative joint disease. The patient notes that she has not had any recent x-rays of her spine or joints. I recommended that she touch base with her primary care physician and start with some x-rays to assess her disease I offered to give the patient tramadol to help with some discomfort.  The patient refused stating that tramadol is like "sugar pills". Explained to her that I could not give her narcotics. I would like to see the patient back in 6 months to surveilled her peripheral artery disease.  If it is stable we can possibly move follow-ups out yearly. I have discussed with the patient at length the risk factors for and pathogenesis of atherosclerotic disease and encouraged a healthy diet, regular exercise regimen and blood pressure / glucose control.  The patient was encouraged to call the office in the interim if he experiences any claudication like symptoms, rest pain or ulcers to his feet / toes.  - VAS [place] ABI WITH/WO TBI; Future - VAS [place] LOWER EXTREMITY ARTERIAL DUPLEX; Future  2. Type 2 diabetes mellitus with other specified complication, unspecified whether Milanese term insulin use (HCC) - Stable Appropriate  medications. Encouraged good control as its slows the progression of atherosclerotic disease  3. Transplated Kidney - Stable The patient is followed at [facility]  Current Outpatient Medications on File Prior to Visit  Medication Sig Dispense Refill  . albuterol (PROVENTIL HFA;VENTOLIN HFA) 108 (90 BASE) MCG/ACT inhaler Inhale 2 puffs into the lungs every 6 (six) hours as needed for wheezing or shortness of breath. 1 Inhaler 2  . amLODipine (NORVASC) 5 MG tablet Take 5 mg by mouth daily.    Marland Kitchen aspirin 81 MG EC tablet Take 81 mg by mouth daily.     . calcium-vitamin D (OSCAL WITH D) 500-200 MG-UNIT tablet Take 1 tablet by mouth daily.    . carvedilol (COREG) 25 MG tablet Take 25 mg by mouth 2 (two) times daily with a meal.    . Difluprednate (DUREZOL) 0.05 % EMUL Apply 1 drop to eye daily as needed.    . doxycycline (VIBRA-TABS) 100 MG tablet Take 100 mg by mouth 2 (two) times daily.    . furosemide (LASIX) 20 MG tablet Take 20 mg by mouth daily.    Marland Kitchen moxifloxacin (VIGAMOX) 0.5 % ophthalmic solution Place 1 drop into both eyes daily as needed.    . Multiple Vitamin (MULTIVITAMIN) tablet Take 1 tablet by mouth daily.    Marland Kitchen MYFORTIC 180 MG EC tablet Take 540 mg by mouth 2 (two) times daily. (brand name only)    . omeprazole (PRILOSEC) 40 MG capsule Take 40 mg by mouth 2 (two) times daily.    . polyethylene glycol (MIRALAX / GLYCOLAX) packet Take 17 g by mouth daily as needed for mild constipation.    . predniSONE (DELTASONE) 2.5 MG tablet Take 5 mg by mouth daily.    Marland Kitchen PROGRAF 1 MG capsule Take 2-3 mg by mouth 2 (two) times daily. 3mg  in the  morning and 2mg  in the evening    . silver sulfADIAZINE (SILVADENE) 1 % cream Apply 1 application topically daily.    Marland Kitchen amitriptyline (ELAVIL) 25 MG tablet Take 25 mg by mouth 2 (two) times daily.    . cefTAZidime 2 g in sodium chloride 0.9 % 100 mL Inject 2 g into the vein every 12 (twelve) hours.    Marland Kitchen HEPARIN LOCK FLUSH IV Inject 3 mLs into the vein daily.      No current facility-administered medications on file prior to visit.    There are no Patient Instructions on file for this visit. No follow-ups on file.  Koron Godeaux A Emri Sample, PA-C

## 2018-03-12 ENCOUNTER — Encounter: Admit: 2018-03-12 | Discharge: 2018-03-13 | Payer: MEDICAID

## 2018-03-12 ENCOUNTER — Ambulatory Visit: Admit: 2018-03-12 | Discharge: 2018-03-13 | Payer: MEDICAID

## 2018-03-12 ENCOUNTER — Encounter: Admit: 2018-03-12 | Discharge: 2018-03-13 | Payer: MEDICAID | Attending: Family | Primary: Family

## 2018-03-12 DIAGNOSIS — M545 Low back pain: Principal | ICD-10-CM

## 2018-03-12 DIAGNOSIS — M25562 Pain in left knee: Principal | ICD-10-CM

## 2018-03-12 DIAGNOSIS — M7122 Synovial cyst of popliteal space [Baker], left knee: Secondary | ICD-10-CM

## 2018-03-12 DIAGNOSIS — M5416 Radiculopathy, lumbar region: Secondary | ICD-10-CM

## 2018-03-12 MED ORDER — CYCLOBENZAPRINE 10 MG TABLET
ORAL_TABLET | Freq: Three times a day (TID) | ORAL | 0 refills | 0 days | Status: CP | PRN
Start: 2018-03-12 — End: ?

## 2018-03-19 MED ORDER — DOXYCYCLINE HYCLATE 100 MG TABLET
ORAL_TABLET | Freq: Two times a day (BID) | ORAL | 3 refills | 0.00000 days | Status: CP
Start: 2018-03-19 — End: 2019-03-19

## 2018-03-20 ENCOUNTER — Encounter: Admit: 2018-03-20 | Discharge: 2018-03-20 | Payer: MEDICAID

## 2018-03-20 ENCOUNTER — Encounter: Admit: 2018-03-20 | Discharge: 2018-03-20 | Payer: MEDICAID | Attending: Podiatrist | Primary: Podiatrist

## 2018-03-20 DIAGNOSIS — E11621 Type 2 diabetes mellitus with foot ulcer: Principal | ICD-10-CM

## 2018-03-20 DIAGNOSIS — N182 Chronic kidney disease, stage 2 (mild): Secondary | ICD-10-CM

## 2018-03-20 DIAGNOSIS — Z940 Kidney transplant status: Principal | ICD-10-CM

## 2018-03-20 DIAGNOSIS — Z79899 Other long term (current) drug therapy: Secondary | ICD-10-CM

## 2018-03-20 DIAGNOSIS — Z9483 Pancreas transplant status: Secondary | ICD-10-CM

## 2018-03-20 DIAGNOSIS — D631 Anemia in chronic kidney disease: Secondary | ICD-10-CM

## 2018-03-20 DIAGNOSIS — L97519 Non-pressure chronic ulcer of other part of right foot with unspecified severity: Secondary | ICD-10-CM

## 2018-03-31 ENCOUNTER — Encounter: Admit: 2018-03-31 | Discharge: 2018-03-31 | Payer: MEDICAID | Attending: Podiatrist | Primary: Podiatrist

## 2018-03-31 DIAGNOSIS — E11621 Type 2 diabetes mellitus with foot ulcer: Principal | ICD-10-CM

## 2018-03-31 DIAGNOSIS — L97413 Non-pressure chronic ulcer of right heel and midfoot with necrosis of muscle: Secondary | ICD-10-CM

## 2018-03-31 MED FILL — MYFORTIC 180 MG TABLET,DELAYED RELEASE: 30 days supply | Qty: 180 | Fill #2 | Status: AC

## 2018-03-31 MED FILL — PROGRAF 1 MG CAPSULE: 30 days supply | Qty: 150 | Fill #2

## 2018-03-31 MED FILL — MYFORTIC 180 MG TABLET,DELAYED RELEASE: ORAL | 30 days supply | Qty: 180 | Fill #2

## 2018-03-31 MED FILL — PROGRAF 1 MG CAPSULE: 30 days supply | Qty: 150 | Fill #2 | Status: AC

## 2018-04-07 ENCOUNTER — Encounter: Admit: 2018-04-07 | Discharge: 2018-04-07 | Payer: MEDICAID | Attending: Podiatrist | Primary: Podiatrist

## 2018-04-07 DIAGNOSIS — E11621 Type 2 diabetes mellitus with foot ulcer: Principal | ICD-10-CM

## 2018-04-07 DIAGNOSIS — L97413 Non-pressure chronic ulcer of right heel and midfoot with necrosis of muscle: Secondary | ICD-10-CM

## 2018-04-07 DIAGNOSIS — M14671 Charcot's joint, right ankle and foot: Secondary | ICD-10-CM

## 2018-04-07 DIAGNOSIS — Z940 Kidney transplant status: Secondary | ICD-10-CM

## 2018-04-07 MED ORDER — SILVER SULFADIAZINE 1 % TOPICAL CREAM
Freq: Every day | TOPICAL | 0 refills | 0 days | Status: CP
Start: 2018-04-07 — End: 2018-10-21

## 2018-04-14 ENCOUNTER — Encounter: Admit: 2018-04-14 | Discharge: 2018-04-14 | Payer: MEDICARE | Attending: Podiatrist | Primary: Podiatrist

## 2018-04-14 DIAGNOSIS — E11621 Type 2 diabetes mellitus with foot ulcer: Principal | ICD-10-CM

## 2018-04-14 DIAGNOSIS — L97413 Non-pressure chronic ulcer of right heel and midfoot with necrosis of muscle: Secondary | ICD-10-CM

## 2018-04-17 MED ORDER — CEFTAZIDIME 2 GRAM INTRAVENOUS SOLUTION
11 refills | 0 days | Status: SS
Start: 2018-04-17 — End: 2018-04-21

## 2018-04-18 DIAGNOSIS — Z89421 Acquired absence of other right toe(s): Secondary | ICD-10-CM

## 2018-04-18 DIAGNOSIS — Z452 Encounter for adjustment and management of vascular access device: Secondary | ICD-10-CM

## 2018-04-18 DIAGNOSIS — E1122 Type 2 diabetes mellitus with diabetic chronic kidney disease: Secondary | ICD-10-CM

## 2018-04-18 DIAGNOSIS — E1142 Type 2 diabetes mellitus with diabetic polyneuropathy: Secondary | ICD-10-CM

## 2018-04-18 DIAGNOSIS — Z792 Long term (current) use of antibiotics: Secondary | ICD-10-CM

## 2018-04-18 DIAGNOSIS — E669 Obesity, unspecified: Secondary | ICD-10-CM

## 2018-04-18 DIAGNOSIS — E11610 Type 2 diabetes mellitus with diabetic neuropathic arthropathy: Secondary | ICD-10-CM

## 2018-04-18 DIAGNOSIS — B952 Enterococcus as the cause of diseases classified elsewhere: Secondary | ICD-10-CM

## 2018-04-18 DIAGNOSIS — Z6830 Body mass index (BMI) 30.0-30.9, adult: Secondary | ICD-10-CM

## 2018-04-18 DIAGNOSIS — L97413 Non-pressure chronic ulcer of right heel and midfoot with necrosis of muscle: Secondary | ICD-10-CM

## 2018-04-18 DIAGNOSIS — E1143 Type 2 diabetes mellitus with diabetic autonomic (poly)neuropathy: Secondary | ICD-10-CM

## 2018-04-18 DIAGNOSIS — M1611 Unilateral primary osteoarthritis, right hip: Secondary | ICD-10-CM

## 2018-04-18 DIAGNOSIS — K3184 Gastroparesis: Secondary | ICD-10-CM

## 2018-04-18 DIAGNOSIS — Z9181 History of falling: Secondary | ICD-10-CM

## 2018-04-18 DIAGNOSIS — N183 Chronic kidney disease, stage 3 (moderate): Secondary | ICD-10-CM

## 2018-04-18 DIAGNOSIS — E11621 Type 2 diabetes mellitus with foot ulcer: Principal | ICD-10-CM

## 2018-04-18 DIAGNOSIS — Z89411 Acquired absence of right great toe: Secondary | ICD-10-CM

## 2018-04-18 DIAGNOSIS — E1151 Type 2 diabetes mellitus with diabetic peripheral angiopathy without gangrene: Secondary | ICD-10-CM

## 2018-04-18 DIAGNOSIS — Z8614 Personal history of Methicillin resistant Staphylococcus aureus infection: Secondary | ICD-10-CM

## 2018-04-18 DIAGNOSIS — Z9483 Pancreas transplant status: Secondary | ICD-10-CM

## 2018-04-18 DIAGNOSIS — Z940 Kidney transplant status: Secondary | ICD-10-CM

## 2018-04-18 DIAGNOSIS — D631 Anemia in chronic kidney disease: Secondary | ICD-10-CM

## 2018-04-18 DIAGNOSIS — B965 Pseudomonas (aeruginosa) (mallei) (pseudomallei) as the cause of diseases classified elsewhere: Secondary | ICD-10-CM

## 2018-04-18 DIAGNOSIS — Z87440 Personal history of urinary (tract) infections: Secondary | ICD-10-CM

## 2018-04-18 DIAGNOSIS — I129 Hypertensive chronic kidney disease with stage 1 through stage 4 chronic kidney disease, or unspecified chronic kidney disease: Secondary | ICD-10-CM

## 2018-04-21 ENCOUNTER — Encounter: Admit: 2018-04-21 | Discharge: 2018-04-21 | Payer: MEDICAID | Attending: Podiatrist | Primary: Podiatrist

## 2018-04-21 ENCOUNTER — Encounter: Admit: 2018-04-21 | Discharge: 2018-04-21 | Payer: MEDICAID

## 2018-04-21 DIAGNOSIS — E10621 Type 1 diabetes mellitus with foot ulcer: Principal | ICD-10-CM

## 2018-04-21 DIAGNOSIS — E11610 Type 2 diabetes mellitus with diabetic neuropathic arthropathy: Secondary | ICD-10-CM

## 2018-04-21 DIAGNOSIS — Z940 Kidney transplant status: Principal | ICD-10-CM

## 2018-04-21 DIAGNOSIS — B999 Unspecified infectious disease: Secondary | ICD-10-CM

## 2018-04-21 DIAGNOSIS — Z79899 Other long term (current) drug therapy: Secondary | ICD-10-CM

## 2018-04-21 DIAGNOSIS — Z9483 Pancreas transplant status: Secondary | ICD-10-CM

## 2018-04-21 DIAGNOSIS — L97413 Non-pressure chronic ulcer of right heel and midfoot with necrosis of muscle: Secondary | ICD-10-CM

## 2018-04-21 MED ORDER — CEFTAZIDIME 2 GRAM INTRAVENOUS SOLUTION
11 refills | 0 days | Status: CP
Start: 2018-04-21 — End: 2018-04-23

## 2018-04-22 ENCOUNTER — Encounter: Admit: 2018-04-22 | Discharge: 2018-05-21 | Payer: MEDICAID

## 2018-04-22 DIAGNOSIS — Z452 Encounter for adjustment and management of vascular access device: Secondary | ICD-10-CM

## 2018-04-22 DIAGNOSIS — E11621 Type 2 diabetes mellitus with foot ulcer: Principal | ICD-10-CM

## 2018-04-22 DIAGNOSIS — Z89421 Acquired absence of other right toe(s): Secondary | ICD-10-CM

## 2018-04-22 DIAGNOSIS — E1122 Type 2 diabetes mellitus with diabetic chronic kidney disease: Secondary | ICD-10-CM

## 2018-04-22 DIAGNOSIS — Z940 Kidney transplant status: Secondary | ICD-10-CM

## 2018-04-22 DIAGNOSIS — Z6830 Body mass index (BMI) 30.0-30.9, adult: Secondary | ICD-10-CM

## 2018-04-22 DIAGNOSIS — N183 Chronic kidney disease, stage 3 (moderate): Secondary | ICD-10-CM

## 2018-04-22 DIAGNOSIS — L97413 Non-pressure chronic ulcer of right heel and midfoot with necrosis of muscle: Secondary | ICD-10-CM

## 2018-04-22 DIAGNOSIS — Z8614 Personal history of Methicillin resistant Staphylococcus aureus infection: Secondary | ICD-10-CM

## 2018-04-22 DIAGNOSIS — E1151 Type 2 diabetes mellitus with diabetic peripheral angiopathy without gangrene: Secondary | ICD-10-CM

## 2018-04-22 DIAGNOSIS — B952 Enterococcus as the cause of diseases classified elsewhere: Secondary | ICD-10-CM

## 2018-04-22 DIAGNOSIS — Z792 Long term (current) use of antibiotics: Secondary | ICD-10-CM

## 2018-04-22 DIAGNOSIS — Z89411 Acquired absence of right great toe: Secondary | ICD-10-CM

## 2018-04-22 DIAGNOSIS — E1142 Type 2 diabetes mellitus with diabetic polyneuropathy: Secondary | ICD-10-CM

## 2018-04-22 DIAGNOSIS — E669 Obesity, unspecified: Secondary | ICD-10-CM

## 2018-04-22 DIAGNOSIS — Z87440 Personal history of urinary (tract) infections: Secondary | ICD-10-CM

## 2018-04-22 DIAGNOSIS — Z9181 History of falling: Secondary | ICD-10-CM

## 2018-04-22 DIAGNOSIS — E11610 Type 2 diabetes mellitus with diabetic neuropathic arthropathy: Secondary | ICD-10-CM

## 2018-04-22 DIAGNOSIS — E1143 Type 2 diabetes mellitus with diabetic autonomic (poly)neuropathy: Secondary | ICD-10-CM

## 2018-04-22 DIAGNOSIS — K3184 Gastroparesis: Secondary | ICD-10-CM

## 2018-04-22 DIAGNOSIS — D631 Anemia in chronic kidney disease: Secondary | ICD-10-CM

## 2018-04-22 DIAGNOSIS — Z9483 Pancreas transplant status: Secondary | ICD-10-CM

## 2018-04-22 DIAGNOSIS — I129 Hypertensive chronic kidney disease with stage 1 through stage 4 chronic kidney disease, or unspecified chronic kidney disease: Secondary | ICD-10-CM

## 2018-04-22 DIAGNOSIS — B965 Pseudomonas (aeruginosa) (mallei) (pseudomallei) as the cause of diseases classified elsewhere: Secondary | ICD-10-CM

## 2018-04-22 DIAGNOSIS — M1611 Unilateral primary osteoarthritis, right hip: Secondary | ICD-10-CM

## 2018-04-23 ENCOUNTER — Encounter
Admit: 2018-04-23 | Discharge: 2018-04-24 | Payer: MEDICAID | Attending: Infectious Disease | Primary: Infectious Disease

## 2018-04-23 DIAGNOSIS — A498 Other bacterial infections of unspecified site: Principal | ICD-10-CM

## 2018-04-23 MED ORDER — CEFTAZIDIME 2 GRAM INTRAVENOUS SOLUTION
11 refills | 0 days
Start: 2018-04-23 — End: 2018-06-16

## 2018-04-28 ENCOUNTER — Ambulatory Visit: Admit: 2018-04-28 | Discharge: 2018-04-28 | Payer: MEDICAID | Attending: Podiatrist | Primary: Podiatrist

## 2018-04-28 DIAGNOSIS — L97412 Non-pressure chronic ulcer of right heel and midfoot with fat layer exposed: Principal | ICD-10-CM

## 2018-04-28 DIAGNOSIS — M14671 Charcot's joint, right ankle and foot: Principal | ICD-10-CM

## 2018-04-28 DIAGNOSIS — E11621 Type 2 diabetes mellitus with foot ulcer: Principal | ICD-10-CM

## 2018-04-29 ENCOUNTER — Encounter: Admit: 2018-04-29 | Discharge: 2018-04-30 | Payer: MEDICAID

## 2018-04-29 DIAGNOSIS — E11621 Type 2 diabetes mellitus with foot ulcer: Principal | ICD-10-CM

## 2018-04-29 DIAGNOSIS — Z6830 Body mass index (BMI) 30.0-30.9, adult: Principal | ICD-10-CM

## 2018-04-29 DIAGNOSIS — E1122 Type 2 diabetes mellitus with diabetic chronic kidney disease: Principal | ICD-10-CM

## 2018-04-29 DIAGNOSIS — D631 Anemia in chronic kidney disease: Principal | ICD-10-CM

## 2018-04-29 DIAGNOSIS — Z940 Kidney transplant status: Principal | ICD-10-CM

## 2018-04-29 DIAGNOSIS — Z9483 Pancreas transplant status: Principal | ICD-10-CM

## 2018-04-29 DIAGNOSIS — E1143 Type 2 diabetes mellitus with diabetic autonomic (poly)neuropathy: Principal | ICD-10-CM

## 2018-04-29 DIAGNOSIS — Z8614 Personal history of Methicillin resistant Staphylococcus aureus infection: Principal | ICD-10-CM

## 2018-04-29 DIAGNOSIS — E11610 Type 2 diabetes mellitus with diabetic neuropathic arthropathy: Principal | ICD-10-CM

## 2018-04-29 DIAGNOSIS — I129 Hypertensive chronic kidney disease with stage 1 through stage 4 chronic kidney disease, or unspecified chronic kidney disease: Principal | ICD-10-CM

## 2018-04-29 DIAGNOSIS — Z89411 Acquired absence of right great toe: Principal | ICD-10-CM

## 2018-04-29 DIAGNOSIS — Z792 Long term (current) use of antibiotics: Principal | ICD-10-CM

## 2018-04-29 DIAGNOSIS — E1151 Type 2 diabetes mellitus with diabetic peripheral angiopathy without gangrene: Principal | ICD-10-CM

## 2018-04-29 DIAGNOSIS — M1611 Unilateral primary osteoarthritis, right hip: Principal | ICD-10-CM

## 2018-04-29 DIAGNOSIS — Z9181 History of falling: Principal | ICD-10-CM

## 2018-04-29 DIAGNOSIS — K3184 Gastroparesis: Principal | ICD-10-CM

## 2018-04-29 DIAGNOSIS — E669 Obesity, unspecified: Principal | ICD-10-CM

## 2018-04-29 DIAGNOSIS — B999 Unspecified infectious disease: Principal | ICD-10-CM

## 2018-04-29 DIAGNOSIS — Z452 Encounter for adjustment and management of vascular access device: Principal | ICD-10-CM

## 2018-04-29 DIAGNOSIS — N183 Chronic kidney disease, stage 3 (moderate): Principal | ICD-10-CM

## 2018-04-29 DIAGNOSIS — Z87440 Personal history of urinary (tract) infections: Principal | ICD-10-CM

## 2018-04-29 DIAGNOSIS — B952 Enterococcus as the cause of diseases classified elsewhere: Principal | ICD-10-CM

## 2018-04-29 DIAGNOSIS — B965 Pseudomonas (aeruginosa) (mallei) (pseudomallei) as the cause of diseases classified elsewhere: Principal | ICD-10-CM

## 2018-04-29 DIAGNOSIS — Z89421 Acquired absence of other right toe(s): Principal | ICD-10-CM

## 2018-04-29 DIAGNOSIS — A498 Other bacterial infections of unspecified site: Principal | ICD-10-CM

## 2018-04-29 DIAGNOSIS — E1142 Type 2 diabetes mellitus with diabetic polyneuropathy: Principal | ICD-10-CM

## 2018-04-29 DIAGNOSIS — I43 Cardiomyopathy in diseases classified elsewhere: Principal | ICD-10-CM

## 2018-04-29 DIAGNOSIS — L97413 Non-pressure chronic ulcer of right heel and midfoot with necrosis of muscle: Principal | ICD-10-CM

## 2018-05-01 MED FILL — PROGRAF 1 MG CAPSULE: 30 days supply | Qty: 150 | Fill #3 | Status: AC

## 2018-05-01 MED FILL — MYFORTIC 180 MG TABLET,DELAYED RELEASE: ORAL | 30 days supply | Qty: 180 | Fill #3

## 2018-05-01 MED FILL — MYFORTIC 180 MG TABLET,DELAYED RELEASE: 30 days supply | Qty: 180 | Fill #3 | Status: AC

## 2018-05-01 MED FILL — PROGRAF 1 MG CAPSULE: 30 days supply | Qty: 150 | Fill #3

## 2018-05-05 ENCOUNTER — Encounter: Admit: 2018-05-05 | Discharge: 2018-05-06 | Payer: MEDICAID | Attending: Podiatrist | Primary: Podiatrist

## 2018-05-05 DIAGNOSIS — M14671 Charcot's joint, right ankle and foot: Principal | ICD-10-CM

## 2018-05-05 DIAGNOSIS — R601 Generalized edema: Principal | ICD-10-CM

## 2018-05-05 DIAGNOSIS — E10610 Type 1 diabetes mellitus with diabetic neuropathic arthropathy: Principal | ICD-10-CM

## 2018-05-05 DIAGNOSIS — R609 Edema, unspecified: Principal | ICD-10-CM

## 2018-05-05 DIAGNOSIS — E10621 Type 1 diabetes mellitus with foot ulcer: Principal | ICD-10-CM

## 2018-05-05 DIAGNOSIS — L97413 Non-pressure chronic ulcer of right heel and midfoot with necrosis of muscle: Principal | ICD-10-CM

## 2018-05-06 ENCOUNTER — Other Ambulatory Visit: Admit: 2018-05-06 | Discharge: 2018-05-07 | Payer: MEDICAID

## 2018-05-06 DIAGNOSIS — I43 Cardiomyopathy in diseases classified elsewhere: Principal | ICD-10-CM

## 2018-05-06 DIAGNOSIS — N183 Chronic kidney disease, stage 3 (moderate): Principal | ICD-10-CM

## 2018-05-06 DIAGNOSIS — K3184 Gastroparesis: Principal | ICD-10-CM

## 2018-05-06 DIAGNOSIS — E1122 Type 2 diabetes mellitus with diabetic chronic kidney disease: Principal | ICD-10-CM

## 2018-05-06 DIAGNOSIS — Z792 Long term (current) use of antibiotics: Principal | ICD-10-CM

## 2018-05-06 DIAGNOSIS — Z9483 Pancreas transplant status: Principal | ICD-10-CM

## 2018-05-06 DIAGNOSIS — Z89421 Acquired absence of other right toe(s): Principal | ICD-10-CM

## 2018-05-06 DIAGNOSIS — Z79899 Other long term (current) drug therapy: Principal | ICD-10-CM

## 2018-05-06 DIAGNOSIS — E669 Obesity, unspecified: Principal | ICD-10-CM

## 2018-05-06 DIAGNOSIS — K591 Functional diarrhea: Principal | ICD-10-CM

## 2018-05-06 DIAGNOSIS — Z89411 Acquired absence of right great toe: Principal | ICD-10-CM

## 2018-05-06 DIAGNOSIS — L97413 Non-pressure chronic ulcer of right heel and midfoot with necrosis of muscle: Principal | ICD-10-CM

## 2018-05-06 DIAGNOSIS — B952 Enterococcus as the cause of diseases classified elsewhere: Principal | ICD-10-CM

## 2018-05-06 DIAGNOSIS — I129 Hypertensive chronic kidney disease with stage 1 through stage 4 chronic kidney disease, or unspecified chronic kidney disease: Principal | ICD-10-CM

## 2018-05-06 DIAGNOSIS — M1611 Unilateral primary osteoarthritis, right hip: Principal | ICD-10-CM

## 2018-05-06 DIAGNOSIS — E11621 Type 2 diabetes mellitus with foot ulcer: Principal | ICD-10-CM

## 2018-05-06 DIAGNOSIS — B965 Pseudomonas (aeruginosa) (mallei) (pseudomallei) as the cause of diseases classified elsewhere: Principal | ICD-10-CM

## 2018-05-06 DIAGNOSIS — Z8614 Personal history of Methicillin resistant Staphylococcus aureus infection: Principal | ICD-10-CM

## 2018-05-06 DIAGNOSIS — E1142 Type 2 diabetes mellitus with diabetic polyneuropathy: Principal | ICD-10-CM

## 2018-05-06 DIAGNOSIS — Z940 Kidney transplant status: Principal | ICD-10-CM

## 2018-05-06 DIAGNOSIS — Z87440 Personal history of urinary (tract) infections: Principal | ICD-10-CM

## 2018-05-06 DIAGNOSIS — E1151 Type 2 diabetes mellitus with diabetic peripheral angiopathy without gangrene: Principal | ICD-10-CM

## 2018-05-06 DIAGNOSIS — B999 Unspecified infectious disease: Principal | ICD-10-CM

## 2018-05-06 DIAGNOSIS — Z452 Encounter for adjustment and management of vascular access device: Principal | ICD-10-CM

## 2018-05-06 DIAGNOSIS — D631 Anemia in chronic kidney disease: Principal | ICD-10-CM

## 2018-05-06 DIAGNOSIS — Z9181 History of falling: Principal | ICD-10-CM

## 2018-05-06 DIAGNOSIS — E1143 Type 2 diabetes mellitus with diabetic autonomic (poly)neuropathy: Principal | ICD-10-CM

## 2018-05-06 DIAGNOSIS — Z6830 Body mass index (BMI) 30.0-30.9, adult: Principal | ICD-10-CM

## 2018-05-06 DIAGNOSIS — E11610 Type 2 diabetes mellitus with diabetic neuropathic arthropathy: Principal | ICD-10-CM

## 2018-05-13 ENCOUNTER — Encounter: Admit: 2018-05-13 | Discharge: 2018-05-14 | Payer: MEDICAID

## 2018-05-13 DIAGNOSIS — Z87440 Personal history of urinary (tract) infections: Principal | ICD-10-CM

## 2018-05-13 DIAGNOSIS — Z89421 Acquired absence of other right toe(s): Principal | ICD-10-CM

## 2018-05-13 DIAGNOSIS — E1143 Type 2 diabetes mellitus with diabetic autonomic (poly)neuropathy: Principal | ICD-10-CM

## 2018-05-13 DIAGNOSIS — Z89411 Acquired absence of right great toe: Principal | ICD-10-CM

## 2018-05-13 DIAGNOSIS — E1122 Type 2 diabetes mellitus with diabetic chronic kidney disease: Principal | ICD-10-CM

## 2018-05-13 DIAGNOSIS — B952 Enterococcus as the cause of diseases classified elsewhere: Principal | ICD-10-CM

## 2018-05-13 DIAGNOSIS — K3184 Gastroparesis: Principal | ICD-10-CM

## 2018-05-13 DIAGNOSIS — Z6830 Body mass index (BMI) 30.0-30.9, adult: Principal | ICD-10-CM

## 2018-05-13 DIAGNOSIS — N183 Chronic kidney disease, stage 3 (moderate): Principal | ICD-10-CM

## 2018-05-13 DIAGNOSIS — Z8614 Personal history of Methicillin resistant Staphylococcus aureus infection: Principal | ICD-10-CM

## 2018-05-13 DIAGNOSIS — E1142 Type 2 diabetes mellitus with diabetic polyneuropathy: Principal | ICD-10-CM

## 2018-05-13 DIAGNOSIS — D631 Anemia in chronic kidney disease: Principal | ICD-10-CM

## 2018-05-13 DIAGNOSIS — Z940 Kidney transplant status: Principal | ICD-10-CM

## 2018-05-13 DIAGNOSIS — E669 Obesity, unspecified: Principal | ICD-10-CM

## 2018-05-13 DIAGNOSIS — Z712 Person consulting for explanation of examination or test findings: Principal | ICD-10-CM

## 2018-05-13 DIAGNOSIS — B965 Pseudomonas (aeruginosa) (mallei) (pseudomallei) as the cause of diseases classified elsewhere: Principal | ICD-10-CM

## 2018-05-13 DIAGNOSIS — L97413 Non-pressure chronic ulcer of right heel and midfoot with necrosis of muscle: Principal | ICD-10-CM

## 2018-05-13 DIAGNOSIS — E1151 Type 2 diabetes mellitus with diabetic peripheral angiopathy without gangrene: Principal | ICD-10-CM

## 2018-05-13 DIAGNOSIS — E11610 Type 2 diabetes mellitus with diabetic neuropathic arthropathy: Principal | ICD-10-CM

## 2018-05-13 DIAGNOSIS — E11621 Type 2 diabetes mellitus with foot ulcer: Principal | ICD-10-CM

## 2018-05-13 DIAGNOSIS — Z452 Encounter for adjustment and management of vascular access device: Principal | ICD-10-CM

## 2018-05-13 DIAGNOSIS — Z792 Long term (current) use of antibiotics: Principal | ICD-10-CM

## 2018-05-13 DIAGNOSIS — Z9181 History of falling: Principal | ICD-10-CM

## 2018-05-13 DIAGNOSIS — Z9483 Pancreas transplant status: Principal | ICD-10-CM

## 2018-05-13 DIAGNOSIS — Z1401 Asymptomatic hemophilia A carrier: Principal | ICD-10-CM

## 2018-05-13 DIAGNOSIS — M1611 Unilateral primary osteoarthritis, right hip: Principal | ICD-10-CM

## 2018-05-13 DIAGNOSIS — I129 Hypertensive chronic kidney disease with stage 1 through stage 4 chronic kidney disease, or unspecified chronic kidney disease: Principal | ICD-10-CM

## 2018-05-19 ENCOUNTER — Encounter: Admit: 2018-05-19 | Discharge: 2018-05-19 | Payer: MEDICAID | Attending: Podiatrist | Primary: Podiatrist

## 2018-05-19 DIAGNOSIS — Z940 Kidney transplant status: Principal | ICD-10-CM

## 2018-05-19 DIAGNOSIS — J189 Pneumonia, unspecified organism: Principal | ICD-10-CM

## 2018-05-19 DIAGNOSIS — E785 Hyperlipidemia, unspecified: Principal | ICD-10-CM

## 2018-05-19 DIAGNOSIS — Z9483 Pancreas transplant status: Principal | ICD-10-CM

## 2018-05-19 DIAGNOSIS — M14671 Charcot's joint, right ankle and foot: Principal | ICD-10-CM

## 2018-05-19 DIAGNOSIS — N189 Chronic kidney disease, unspecified: Principal | ICD-10-CM

## 2018-05-19 DIAGNOSIS — I10 Essential (primary) hypertension: Principal | ICD-10-CM

## 2018-05-19 DIAGNOSIS — E10621 Type 1 diabetes mellitus with foot ulcer: Principal | ICD-10-CM

## 2018-05-19 DIAGNOSIS — K219 Gastro-esophageal reflux disease without esophagitis: Principal | ICD-10-CM

## 2018-05-19 DIAGNOSIS — E11621 Type 2 diabetes mellitus with foot ulcer: Principal | ICD-10-CM

## 2018-05-19 DIAGNOSIS — L97413 Non-pressure chronic ulcer of right heel and midfoot with necrosis of muscle: Secondary | ICD-10-CM

## 2018-05-19 DIAGNOSIS — G5600 Carpal tunnel syndrome, unspecified upper limb: Principal | ICD-10-CM

## 2018-05-19 DIAGNOSIS — D649 Anemia, unspecified: Principal | ICD-10-CM

## 2018-05-19 DIAGNOSIS — E11610 Type 2 diabetes mellitus with diabetic neuropathic arthropathy: Principal | ICD-10-CM

## 2018-05-19 DIAGNOSIS — E119 Type 2 diabetes mellitus without complications: Principal | ICD-10-CM

## 2018-05-19 DIAGNOSIS — L97519 Non-pressure chronic ulcer of other part of right foot with unspecified severity: Principal | ICD-10-CM

## 2018-05-20 ENCOUNTER — Encounter: Admit: 2018-05-20 | Discharge: 2018-05-21 | Payer: MEDICAID

## 2018-05-20 DIAGNOSIS — L97413 Non-pressure chronic ulcer of right heel and midfoot with necrosis of muscle: Principal | ICD-10-CM

## 2018-05-20 DIAGNOSIS — Z8614 Personal history of Methicillin resistant Staphylococcus aureus infection: Principal | ICD-10-CM

## 2018-05-20 DIAGNOSIS — Z89411 Acquired absence of right great toe: Principal | ICD-10-CM

## 2018-05-20 DIAGNOSIS — B952 Enterococcus as the cause of diseases classified elsewhere: Principal | ICD-10-CM

## 2018-05-20 DIAGNOSIS — Z87440 Personal history of urinary (tract) infections: Principal | ICD-10-CM

## 2018-05-20 DIAGNOSIS — Z6830 Body mass index (BMI) 30.0-30.9, adult: Principal | ICD-10-CM

## 2018-05-20 DIAGNOSIS — N183 Chronic kidney disease, stage 3 (moderate): Principal | ICD-10-CM

## 2018-05-20 DIAGNOSIS — E11621 Type 2 diabetes mellitus with foot ulcer: Principal | ICD-10-CM

## 2018-05-20 DIAGNOSIS — K3184 Gastroparesis: Principal | ICD-10-CM

## 2018-05-20 DIAGNOSIS — Z452 Encounter for adjustment and management of vascular access device: Principal | ICD-10-CM

## 2018-05-20 DIAGNOSIS — Z89421 Acquired absence of other right toe(s): Principal | ICD-10-CM

## 2018-05-20 DIAGNOSIS — I129 Hypertensive chronic kidney disease with stage 1 through stage 4 chronic kidney disease, or unspecified chronic kidney disease: Principal | ICD-10-CM

## 2018-05-20 DIAGNOSIS — Z792 Long term (current) use of antibiotics: Principal | ICD-10-CM

## 2018-05-20 DIAGNOSIS — E1122 Type 2 diabetes mellitus with diabetic chronic kidney disease: Principal | ICD-10-CM

## 2018-05-20 DIAGNOSIS — E1142 Type 2 diabetes mellitus with diabetic polyneuropathy: Principal | ICD-10-CM

## 2018-05-20 DIAGNOSIS — Z9483 Pancreas transplant status: Principal | ICD-10-CM

## 2018-05-20 DIAGNOSIS — M1611 Unilateral primary osteoarthritis, right hip: Principal | ICD-10-CM

## 2018-05-20 DIAGNOSIS — E11610 Type 2 diabetes mellitus with diabetic neuropathic arthropathy: Principal | ICD-10-CM

## 2018-05-20 DIAGNOSIS — E1143 Type 2 diabetes mellitus with diabetic autonomic (poly)neuropathy: Principal | ICD-10-CM

## 2018-05-20 DIAGNOSIS — D631 Anemia in chronic kidney disease: Principal | ICD-10-CM

## 2018-05-20 DIAGNOSIS — B965 Pseudomonas (aeruginosa) (mallei) (pseudomallei) as the cause of diseases classified elsewhere: Principal | ICD-10-CM

## 2018-05-20 DIAGNOSIS — E669 Obesity, unspecified: Principal | ICD-10-CM

## 2018-05-20 DIAGNOSIS — E1151 Type 2 diabetes mellitus with diabetic peripheral angiopathy without gangrene: Principal | ICD-10-CM

## 2018-05-20 DIAGNOSIS — Z9181 History of falling: Principal | ICD-10-CM

## 2018-05-20 DIAGNOSIS — Z940 Kidney transplant status: Principal | ICD-10-CM

## 2018-05-22 ENCOUNTER — Encounter: Admit: 2018-05-22 | Discharge: 2018-06-04 | Payer: MEDICAID

## 2018-05-27 ENCOUNTER — Encounter: Admit: 2018-05-27 | Discharge: 2018-05-28 | Payer: MEDICAID

## 2018-05-27 DIAGNOSIS — E11621 Type 2 diabetes mellitus with foot ulcer: Principal | ICD-10-CM

## 2018-05-27 DIAGNOSIS — I129 Hypertensive chronic kidney disease with stage 1 through stage 4 chronic kidney disease, or unspecified chronic kidney disease: Principal | ICD-10-CM

## 2018-05-27 DIAGNOSIS — Z792 Long term (current) use of antibiotics: Principal | ICD-10-CM

## 2018-05-27 DIAGNOSIS — Z87440 Personal history of urinary (tract) infections: Principal | ICD-10-CM

## 2018-05-27 DIAGNOSIS — Z89421 Acquired absence of other right toe(s): Principal | ICD-10-CM

## 2018-05-27 DIAGNOSIS — Z8614 Personal history of Methicillin resistant Staphylococcus aureus infection: Principal | ICD-10-CM

## 2018-05-27 DIAGNOSIS — E1151 Type 2 diabetes mellitus with diabetic peripheral angiopathy without gangrene: Principal | ICD-10-CM

## 2018-05-27 DIAGNOSIS — M1611 Unilateral primary osteoarthritis, right hip: Principal | ICD-10-CM

## 2018-05-27 DIAGNOSIS — D631 Anemia in chronic kidney disease: Principal | ICD-10-CM

## 2018-05-27 DIAGNOSIS — Z9181 History of falling: Principal | ICD-10-CM

## 2018-05-27 DIAGNOSIS — E11610 Type 2 diabetes mellitus with diabetic neuropathic arthropathy: Principal | ICD-10-CM

## 2018-05-27 DIAGNOSIS — E1143 Type 2 diabetes mellitus with diabetic autonomic (poly)neuropathy: Principal | ICD-10-CM

## 2018-05-27 DIAGNOSIS — Z6830 Body mass index (BMI) 30.0-30.9, adult: Principal | ICD-10-CM

## 2018-05-27 DIAGNOSIS — N183 Chronic kidney disease, stage 3 (moderate): Principal | ICD-10-CM

## 2018-05-27 DIAGNOSIS — E669 Obesity, unspecified: Principal | ICD-10-CM

## 2018-05-27 DIAGNOSIS — Z940 Kidney transplant status: Principal | ICD-10-CM

## 2018-05-27 DIAGNOSIS — K3184 Gastroparesis: Principal | ICD-10-CM

## 2018-05-27 DIAGNOSIS — Z89411 Acquired absence of right great toe: Principal | ICD-10-CM

## 2018-05-27 DIAGNOSIS — L97413 Non-pressure chronic ulcer of right heel and midfoot with necrosis of muscle: Principal | ICD-10-CM

## 2018-05-27 DIAGNOSIS — Z9483 Pancreas transplant status: Principal | ICD-10-CM

## 2018-05-27 DIAGNOSIS — E1122 Type 2 diabetes mellitus with diabetic chronic kidney disease: Principal | ICD-10-CM

## 2018-05-27 DIAGNOSIS — E1142 Type 2 diabetes mellitus with diabetic polyneuropathy: Principal | ICD-10-CM

## 2018-05-27 DIAGNOSIS — B952 Enterococcus as the cause of diseases classified elsewhere: Principal | ICD-10-CM

## 2018-05-27 DIAGNOSIS — B965 Pseudomonas (aeruginosa) (mallei) (pseudomallei) as the cause of diseases classified elsewhere: Principal | ICD-10-CM

## 2018-05-27 DIAGNOSIS — Z452 Encounter for adjustment and management of vascular access device: Principal | ICD-10-CM

## 2018-05-27 MED FILL — PROGRAF 1 MG CAPSULE: 30 days supply | Qty: 150 | Fill #4 | Status: AC

## 2018-05-27 MED FILL — MYFORTIC 180 MG TABLET,DELAYED RELEASE: 30 days supply | Qty: 180 | Fill #4 | Status: AC

## 2018-05-27 MED FILL — MYFORTIC 180 MG TABLET,DELAYED RELEASE: ORAL | 30 days supply | Qty: 180 | Fill #4

## 2018-05-27 MED FILL — PROGRAF 1 MG CAPSULE: 30 days supply | Qty: 150 | Fill #4

## 2018-05-28 DIAGNOSIS — D631 Anemia in chronic kidney disease: Secondary | ICD-10-CM

## 2018-05-28 DIAGNOSIS — E872 Acidosis: Secondary | ICD-10-CM

## 2018-05-28 DIAGNOSIS — D899 Disorder involving the immune mechanism, unspecified: Secondary | ICD-10-CM

## 2018-05-28 DIAGNOSIS — I10 Essential (primary) hypertension: Secondary | ICD-10-CM

## 2018-05-28 DIAGNOSIS — N183 Chronic kidney disease, stage 3 (moderate): Secondary | ICD-10-CM

## 2018-05-28 DIAGNOSIS — Z940 Kidney transplant status: Principal | ICD-10-CM

## 2018-05-28 MED ORDER — PROGRAF 1 MG CAPSULE
ORAL_CAPSULE | 5 refills | 0 days
Start: 2018-05-28 — End: 2018-06-18

## 2018-05-30 MED ORDER — ATORVASTATIN 40 MG TABLET
0 refills | 0 days | Status: CP
Start: 2018-05-30 — End: 2018-07-23

## 2018-05-30 MED ORDER — SODIUM BICARBONATE 650 MG TABLET
0 refills | 0 days | Status: CP
Start: 2018-05-30 — End: ?

## 2018-06-03 IMAGING — CR DG FOOT 2V*R*
2 series · 2 of 2 positions shown · non-contrast
Comparison: 02/21/2013

CLINICAL DATA: RIGHT foot swelling, sepsis, question osteomyelitis

EXAM:
RIGHT FOOT - 2 VIEW

[foot ap]
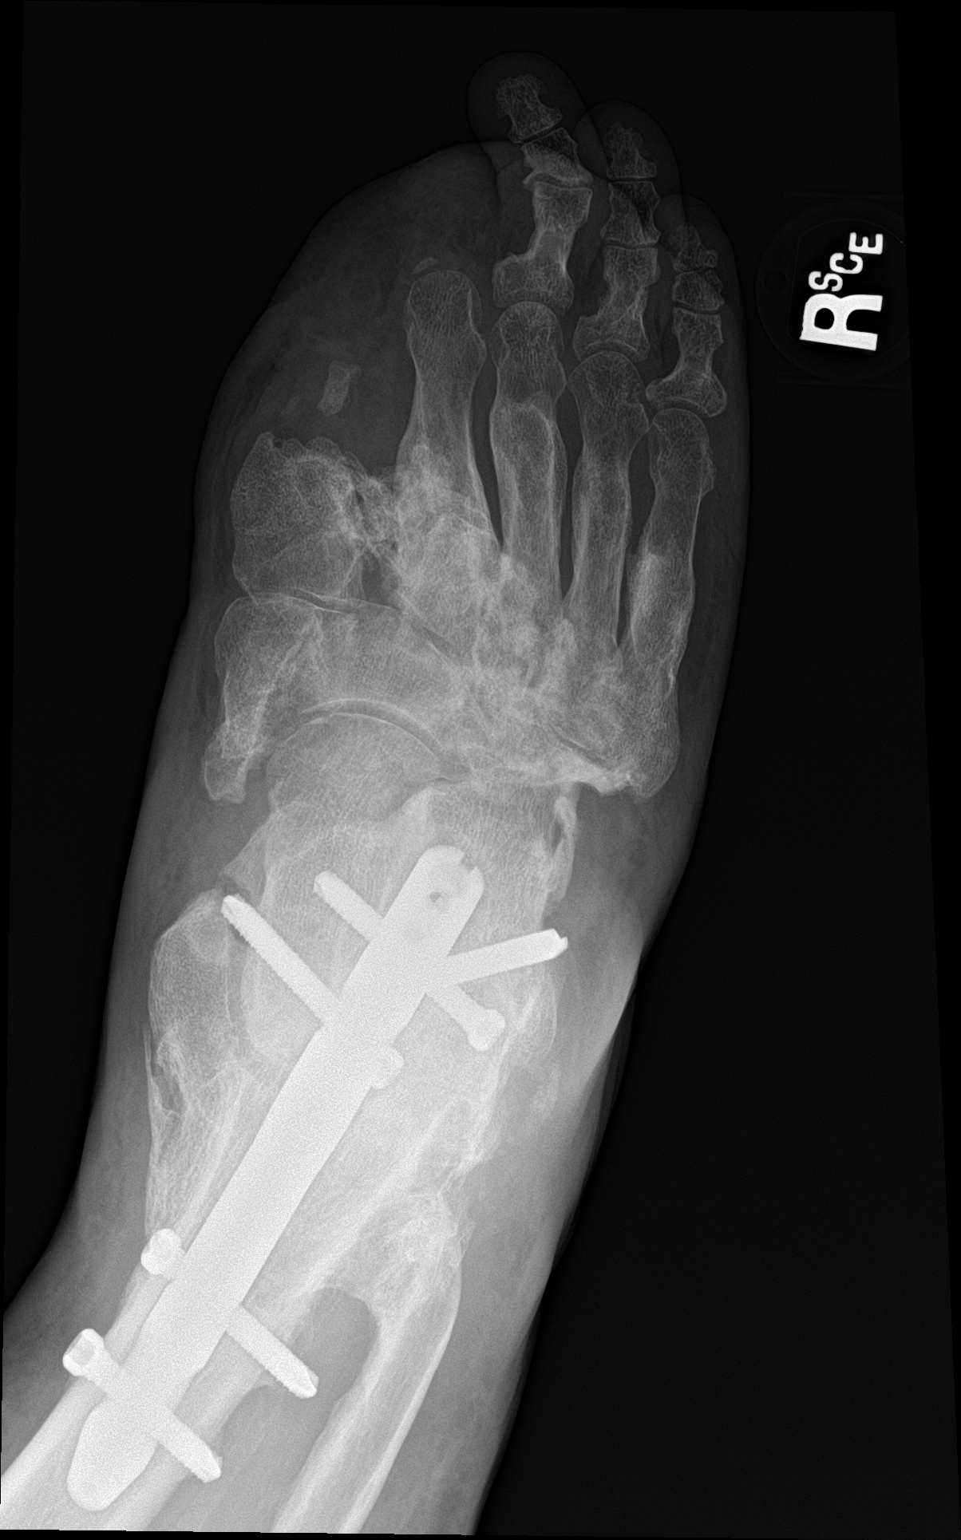

[foot lat]
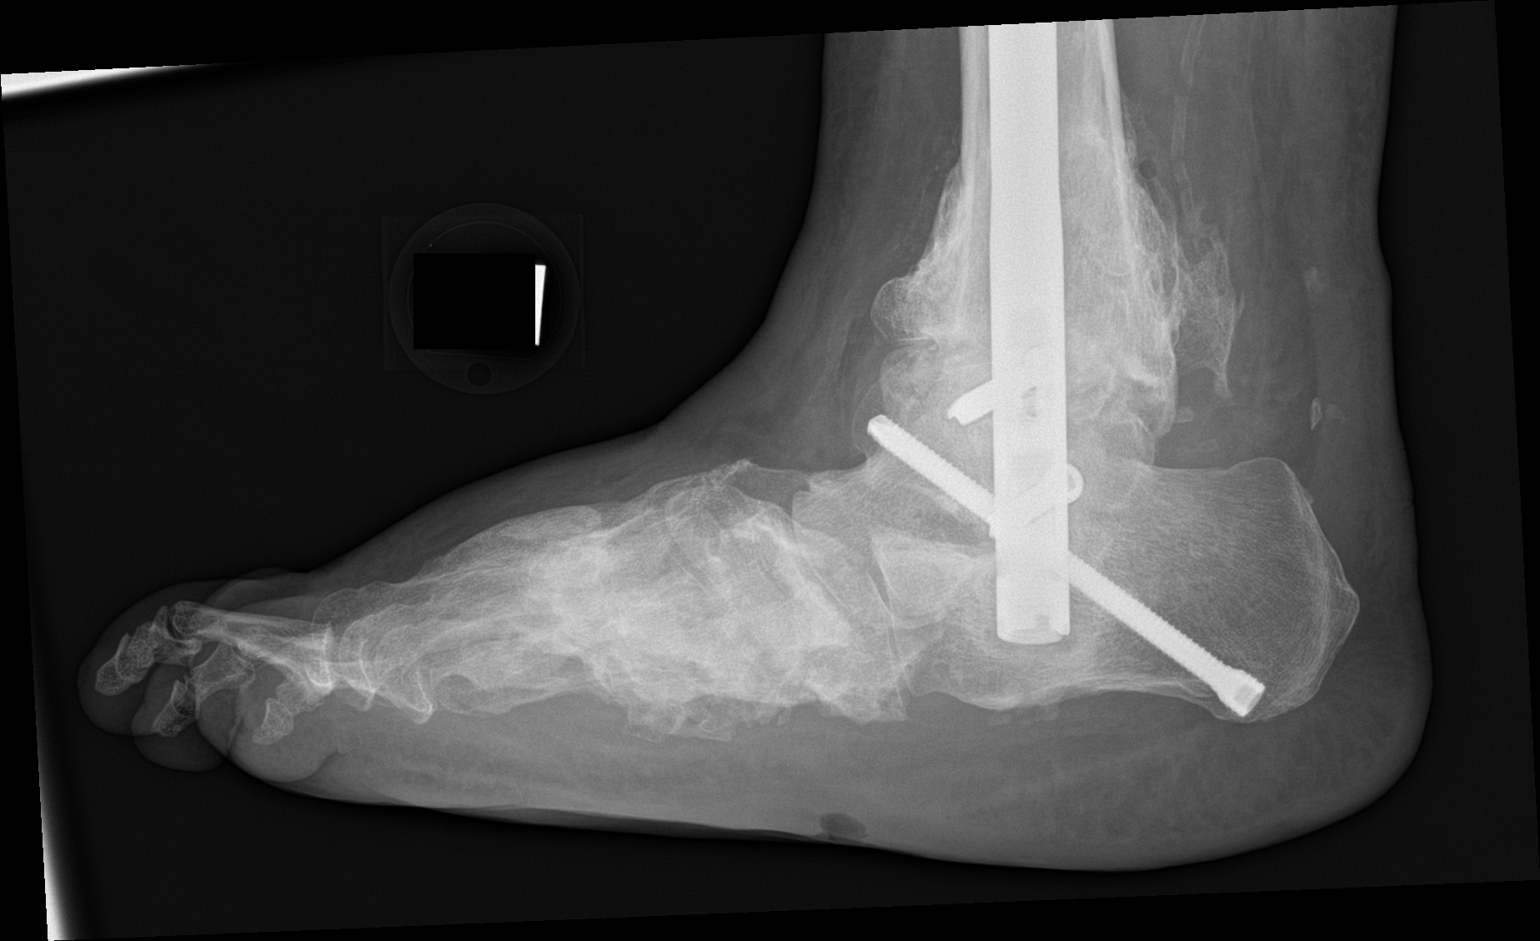

[2 of 2 positions shown; findings below may reference images not displayed]

FINDINGS: Interval amputation of the first ray through the mid first
metatarsal.

Prior amputation of second toe.

Soft tissue swelling at the amputation bed and diffusely throughout
RIGHT foot and ankle.

Bones appear diffusely demineralized.

No acute fracture, dislocation, or bone destruction.

Degenerative changes at PIP joint third toe.

Degenerative changes/Charcot changes at TMT joints with destruction
of the distal tarsal row. Healed fractures of the second through
fifth metatarsals.

Degenerative changes at PIP joint third toe.

Charcot changes and subsistence at the TMT joints and distal tarsal
row.

Prior ankle joint fusion.

Focus of soft tissue gas/ulcer at plantar aspect of mid foot.
IMPRESSION: Interval ankle fusion and amputation of first ray.

Marked progressive degenerative/Charcot changes of the TMT joints
and intertarsal joints.

Diffuse soft tissue swelling with a ulcer at plantar aspect of mid
foot.

No definite evidence of osteomyelitis.

## 2018-06-04 ENCOUNTER — Encounter: Admit: 2018-06-04 | Discharge: 2018-06-05 | Payer: MEDICAID

## 2018-06-04 DIAGNOSIS — I129 Hypertensive chronic kidney disease with stage 1 through stage 4 chronic kidney disease, or unspecified chronic kidney disease: Secondary | ICD-10-CM

## 2018-06-04 DIAGNOSIS — E11621 Type 2 diabetes mellitus with foot ulcer: Principal | ICD-10-CM

## 2018-06-04 DIAGNOSIS — Z792 Long term (current) use of antibiotics: Principal | ICD-10-CM

## 2018-06-04 DIAGNOSIS — M0080 Arthritis due to other bacteria, unspecified joint: Secondary | ICD-10-CM

## 2018-06-04 DIAGNOSIS — Z8614 Personal history of Methicillin resistant Staphylococcus aureus infection: Secondary | ICD-10-CM

## 2018-06-04 DIAGNOSIS — Z452 Encounter for adjustment and management of vascular access device: Secondary | ICD-10-CM

## 2018-06-04 DIAGNOSIS — E1143 Type 2 diabetes mellitus with diabetic autonomic (poly)neuropathy: Secondary | ICD-10-CM

## 2018-06-04 DIAGNOSIS — Z89421 Acquired absence of other right toe(s): Secondary | ICD-10-CM

## 2018-06-04 DIAGNOSIS — E1151 Type 2 diabetes mellitus with diabetic peripheral angiopathy without gangrene: Secondary | ICD-10-CM

## 2018-06-04 DIAGNOSIS — E1142 Type 2 diabetes mellitus with diabetic polyneuropathy: Secondary | ICD-10-CM

## 2018-06-04 DIAGNOSIS — Z940 Kidney transplant status: Secondary | ICD-10-CM

## 2018-06-04 DIAGNOSIS — B965 Pseudomonas (aeruginosa) (mallei) (pseudomallei) as the cause of diseases classified elsewhere: Secondary | ICD-10-CM

## 2018-06-04 DIAGNOSIS — B952 Enterococcus as the cause of diseases classified elsewhere: Secondary | ICD-10-CM

## 2018-06-04 DIAGNOSIS — N183 Chronic kidney disease, stage 3 (moderate): Secondary | ICD-10-CM

## 2018-06-04 DIAGNOSIS — E1122 Type 2 diabetes mellitus with diabetic chronic kidney disease: Secondary | ICD-10-CM

## 2018-06-04 DIAGNOSIS — L97413 Non-pressure chronic ulcer of right heel and midfoot with necrosis of muscle: Secondary | ICD-10-CM

## 2018-06-04 DIAGNOSIS — E669 Obesity, unspecified: Secondary | ICD-10-CM

## 2018-06-04 DIAGNOSIS — Z6830 Body mass index (BMI) 30.0-30.9, adult: Secondary | ICD-10-CM

## 2018-06-04 DIAGNOSIS — Z9181 History of falling: Secondary | ICD-10-CM

## 2018-06-04 DIAGNOSIS — Z9483 Pancreas transplant status: Secondary | ICD-10-CM

## 2018-06-04 DIAGNOSIS — Z89411 Acquired absence of right great toe: Secondary | ICD-10-CM

## 2018-06-04 DIAGNOSIS — E11610 Type 2 diabetes mellitus with diabetic neuropathic arthropathy: Secondary | ICD-10-CM

## 2018-06-04 DIAGNOSIS — D631 Anemia in chronic kidney disease: Secondary | ICD-10-CM

## 2018-06-04 DIAGNOSIS — M1611 Unilateral primary osteoarthritis, right hip: Secondary | ICD-10-CM

## 2018-06-04 DIAGNOSIS — K3184 Gastroparesis: Secondary | ICD-10-CM

## 2018-06-04 DIAGNOSIS — Z87440 Personal history of urinary (tract) infections: Secondary | ICD-10-CM

## 2018-06-10 DIAGNOSIS — E1142 Type 2 diabetes mellitus with diabetic polyneuropathy: Secondary | ICD-10-CM

## 2018-06-10 DIAGNOSIS — Z940 Kidney transplant status: Secondary | ICD-10-CM

## 2018-06-10 DIAGNOSIS — M1611 Unilateral primary osteoarthritis, right hip: Secondary | ICD-10-CM

## 2018-06-10 DIAGNOSIS — E669 Obesity, unspecified: Secondary | ICD-10-CM

## 2018-06-10 DIAGNOSIS — Z9483 Pancreas transplant status: Secondary | ICD-10-CM

## 2018-06-10 DIAGNOSIS — Z89411 Acquired absence of right great toe: Secondary | ICD-10-CM

## 2018-06-10 DIAGNOSIS — Z792 Long term (current) use of antibiotics: Secondary | ICD-10-CM

## 2018-06-10 DIAGNOSIS — E11610 Type 2 diabetes mellitus with diabetic neuropathic arthropathy: Secondary | ICD-10-CM

## 2018-06-10 DIAGNOSIS — Z87440 Personal history of urinary (tract) infections: Secondary | ICD-10-CM

## 2018-06-10 DIAGNOSIS — E1122 Type 2 diabetes mellitus with diabetic chronic kidney disease: Secondary | ICD-10-CM

## 2018-06-10 DIAGNOSIS — Z89421 Acquired absence of other right toe(s): Secondary | ICD-10-CM

## 2018-06-10 DIAGNOSIS — E11621 Type 2 diabetes mellitus with foot ulcer: Principal | ICD-10-CM

## 2018-06-10 DIAGNOSIS — L97413 Non-pressure chronic ulcer of right heel and midfoot with necrosis of muscle: Secondary | ICD-10-CM

## 2018-06-10 DIAGNOSIS — B965 Pseudomonas (aeruginosa) (mallei) (pseudomallei) as the cause of diseases classified elsewhere: Secondary | ICD-10-CM

## 2018-06-10 DIAGNOSIS — E1151 Type 2 diabetes mellitus with diabetic peripheral angiopathy without gangrene: Secondary | ICD-10-CM

## 2018-06-10 DIAGNOSIS — I129 Hypertensive chronic kidney disease with stage 1 through stage 4 chronic kidney disease, or unspecified chronic kidney disease: Secondary | ICD-10-CM

## 2018-06-10 DIAGNOSIS — Z6830 Body mass index (BMI) 30.0-30.9, adult: Secondary | ICD-10-CM

## 2018-06-10 DIAGNOSIS — B952 Enterococcus as the cause of diseases classified elsewhere: Secondary | ICD-10-CM

## 2018-06-10 DIAGNOSIS — K3184 Gastroparesis: Secondary | ICD-10-CM

## 2018-06-10 DIAGNOSIS — Z9181 History of falling: Secondary | ICD-10-CM

## 2018-06-10 DIAGNOSIS — Z8614 Personal history of Methicillin resistant Staphylococcus aureus infection: Secondary | ICD-10-CM

## 2018-06-10 DIAGNOSIS — E1143 Type 2 diabetes mellitus with diabetic autonomic (poly)neuropathy: Secondary | ICD-10-CM

## 2018-06-10 DIAGNOSIS — N183 Chronic kidney disease, stage 3 (moderate): Secondary | ICD-10-CM

## 2018-06-10 DIAGNOSIS — Z452 Encounter for adjustment and management of vascular access device: Secondary | ICD-10-CM

## 2018-06-10 DIAGNOSIS — D631 Anemia in chronic kidney disease: Secondary | ICD-10-CM

## 2018-06-11 ENCOUNTER — Institutional Professional Consult (permissible substitution): Admit: 2018-06-11 | Discharge: 2018-06-12 | Payer: MEDICAID | Attending: Podiatrist | Primary: Podiatrist

## 2018-06-11 DIAGNOSIS — E11621 Type 2 diabetes mellitus with foot ulcer: Principal | ICD-10-CM

## 2018-06-11 DIAGNOSIS — E11610 Type 2 diabetes mellitus with diabetic neuropathic arthropathy: Secondary | ICD-10-CM

## 2018-06-11 DIAGNOSIS — Z452 Encounter for adjustment and management of vascular access device: Secondary | ICD-10-CM

## 2018-06-11 DIAGNOSIS — Z89411 Acquired absence of right great toe: Secondary | ICD-10-CM

## 2018-06-11 DIAGNOSIS — E1122 Type 2 diabetes mellitus with diabetic chronic kidney disease: Secondary | ICD-10-CM

## 2018-06-11 DIAGNOSIS — Z6830 Body mass index (BMI) 30.0-30.9, adult: Secondary | ICD-10-CM

## 2018-06-11 DIAGNOSIS — E1142 Type 2 diabetes mellitus with diabetic polyneuropathy: Secondary | ICD-10-CM

## 2018-06-11 DIAGNOSIS — B952 Enterococcus as the cause of diseases classified elsewhere: Secondary | ICD-10-CM

## 2018-06-11 DIAGNOSIS — L97413 Non-pressure chronic ulcer of right heel and midfoot with necrosis of muscle: Secondary | ICD-10-CM

## 2018-06-11 DIAGNOSIS — E669 Obesity, unspecified: Secondary | ICD-10-CM

## 2018-06-11 DIAGNOSIS — N183 Chronic kidney disease, stage 3 (moderate): Secondary | ICD-10-CM

## 2018-06-11 DIAGNOSIS — Z792 Long term (current) use of antibiotics: Secondary | ICD-10-CM

## 2018-06-11 DIAGNOSIS — Z87440 Personal history of urinary (tract) infections: Secondary | ICD-10-CM

## 2018-06-11 DIAGNOSIS — M1611 Unilateral primary osteoarthritis, right hip: Secondary | ICD-10-CM

## 2018-06-11 DIAGNOSIS — Z9181 History of falling: Secondary | ICD-10-CM

## 2018-06-11 DIAGNOSIS — D631 Anemia in chronic kidney disease: Secondary | ICD-10-CM

## 2018-06-11 DIAGNOSIS — E1151 Type 2 diabetes mellitus with diabetic peripheral angiopathy without gangrene: Secondary | ICD-10-CM

## 2018-06-11 DIAGNOSIS — E1143 Type 2 diabetes mellitus with diabetic autonomic (poly)neuropathy: Secondary | ICD-10-CM

## 2018-06-11 DIAGNOSIS — Z8614 Personal history of Methicillin resistant Staphylococcus aureus infection: Secondary | ICD-10-CM

## 2018-06-11 DIAGNOSIS — Z940 Kidney transplant status: Secondary | ICD-10-CM

## 2018-06-11 DIAGNOSIS — K3184 Gastroparesis: Secondary | ICD-10-CM

## 2018-06-11 DIAGNOSIS — Z9483 Pancreas transplant status: Secondary | ICD-10-CM

## 2018-06-11 DIAGNOSIS — I129 Hypertensive chronic kidney disease with stage 1 through stage 4 chronic kidney disease, or unspecified chronic kidney disease: Secondary | ICD-10-CM

## 2018-06-11 DIAGNOSIS — Z89421 Acquired absence of other right toe(s): Secondary | ICD-10-CM

## 2018-06-11 DIAGNOSIS — B965 Pseudomonas (aeruginosa) (mallei) (pseudomallei) as the cause of diseases classified elsewhere: Secondary | ICD-10-CM

## 2018-06-16 ENCOUNTER — Institutional Professional Consult (permissible substitution): Admit: 2018-06-16 | Discharge: 2018-06-17 | Payer: MEDICAID | Attending: Podiatrist | Primary: Podiatrist

## 2018-06-16 DIAGNOSIS — L97418 Non-pressure chronic ulcer of right heel and midfoot with other specified severity: Secondary | ICD-10-CM

## 2018-06-16 DIAGNOSIS — E11621 Type 2 diabetes mellitus with foot ulcer: Principal | ICD-10-CM

## 2018-06-18 MED ORDER — PROGRAF 1 MG CAPSULE
ORAL_CAPSULE | 11 refills | 0 days | Status: CP
Start: 2018-06-18 — End: 2018-10-02
  Filled 2018-06-24: qty 180, 30d supply, fill #0

## 2018-06-24 MED FILL — MYFORTIC 180 MG TABLET,DELAYED RELEASE: ORAL | 30 days supply | Qty: 180 | Fill #5

## 2018-06-24 MED FILL — MYFORTIC 180 MG TABLET,DELAYED RELEASE: 30 days supply | Qty: 180 | Fill #5 | Status: AC

## 2018-06-24 MED FILL — PROGRAF 1 MG CAPSULE: 30 days supply | Qty: 180 | Fill #0 | Status: AC

## 2018-06-30 MED ORDER — PREDNISONE 2.5 MG TABLET
ORAL_TABLET | Freq: Every day | ORAL | 3 refills | 0 days | Status: CP
Start: 2018-06-30 — End: ?

## 2018-07-03 ENCOUNTER — Encounter: Admit: 2018-07-03 | Discharge: 2018-07-04 | Payer: MEDICAID | Attending: Podiatrist | Primary: Podiatrist

## 2018-07-03 DIAGNOSIS — L97418 Non-pressure chronic ulcer of right heel and midfoot with other specified severity: Secondary | ICD-10-CM

## 2018-07-03 DIAGNOSIS — E11621 Type 2 diabetes mellitus with foot ulcer: Principal | ICD-10-CM

## 2018-07-03 DIAGNOSIS — M14671 Charcot's joint, right ankle and foot: Secondary | ICD-10-CM

## 2018-07-03 DIAGNOSIS — Z940 Kidney transplant status: Secondary | ICD-10-CM

## 2018-07-03 DIAGNOSIS — M86671 Other chronic osteomyelitis, right ankle and foot: Secondary | ICD-10-CM

## 2018-07-04 ENCOUNTER — Encounter: Admit: 2018-07-04 | Payer: MEDICAID

## 2018-07-10 ENCOUNTER — Encounter: Admit: 2018-07-10 | Discharge: 2018-07-11 | Payer: MEDICAID

## 2018-07-10 DIAGNOSIS — D899 Disorder involving the immune mechanism, unspecified: Secondary | ICD-10-CM

## 2018-07-10 DIAGNOSIS — M25512 Pain in left shoulder: Principal | ICD-10-CM

## 2018-07-10 DIAGNOSIS — I739 Peripheral vascular disease, unspecified: Secondary | ICD-10-CM

## 2018-07-10 DIAGNOSIS — M7542 Impingement syndrome of left shoulder: Secondary | ICD-10-CM

## 2018-07-10 DIAGNOSIS — D631 Anemia in chronic kidney disease: Secondary | ICD-10-CM

## 2018-07-10 DIAGNOSIS — N183 Chronic kidney disease, stage 3 (moderate): Secondary | ICD-10-CM

## 2018-07-10 DIAGNOSIS — D849 Immunodeficiency, unspecified: Secondary | ICD-10-CM | POA: Insufficient documentation

## 2018-07-14 ENCOUNTER — Encounter: Admit: 2018-07-14 | Discharge: 2018-07-15 | Payer: MEDICAID | Attending: Podiatrist | Primary: Podiatrist

## 2018-07-14 DIAGNOSIS — L97419 Non-pressure chronic ulcer of right heel and midfoot with unspecified severity: Principal | ICD-10-CM

## 2018-07-16 MED ORDER — MYFORTIC 180 MG TABLET,DELAYED RELEASE
ORAL_TABLET | ORAL | 5 refills | 0 days | Status: CP
Start: 2018-07-16 — End: 2019-07-16
  Filled 2018-07-23: qty 180, 30d supply, fill #0

## 2018-07-22 ENCOUNTER — Encounter
Admit: 2018-07-22 | Discharge: 2018-07-23 | Payer: MEDICAID | Attending: Infectious Disease | Primary: Infectious Disease

## 2018-07-22 ENCOUNTER — Encounter: Admit: 2018-07-22 | Discharge: 2018-07-23 | Payer: MEDICAID | Attending: Podiatrist | Primary: Podiatrist

## 2018-07-22 DIAGNOSIS — L089 Local infection of the skin and subcutaneous tissue, unspecified: Principal | ICD-10-CM

## 2018-07-22 DIAGNOSIS — E1169 Type 2 diabetes mellitus with other specified complication: Secondary | ICD-10-CM

## 2018-07-23 ENCOUNTER — Institutional Professional Consult (permissible substitution): Admit: 2018-07-23 | Discharge: 2018-07-24 | Payer: MEDICAID | Attending: Podiatrist | Primary: Podiatrist

## 2018-07-23 DIAGNOSIS — M86671 Other chronic osteomyelitis, right ankle and foot: Principal | ICD-10-CM

## 2018-07-23 DIAGNOSIS — M14671 Charcot's joint, right ankle and foot: Secondary | ICD-10-CM

## 2018-07-23 DIAGNOSIS — E11621 Type 2 diabetes mellitus with foot ulcer: Secondary | ICD-10-CM

## 2018-07-23 DIAGNOSIS — L97419 Non-pressure chronic ulcer of right heel and midfoot with unspecified severity: Secondary | ICD-10-CM

## 2018-07-23 MED ORDER — ATORVASTATIN 40 MG TABLET
11 refills | 0 days | Status: CP
Start: 2018-07-23 — End: ?

## 2018-07-23 MED FILL — PROGRAF 1 MG CAPSULE: 30 days supply | Qty: 180 | Fill #1 | Status: AC

## 2018-07-23 MED FILL — MYFORTIC 180 MG TABLET,DELAYED RELEASE: 30 days supply | Qty: 180 | Fill #0 | Status: AC

## 2018-07-23 MED FILL — PROGRAF 1 MG CAPSULE: 30 days supply | Qty: 180 | Fill #1

## 2018-07-28 ENCOUNTER — Encounter: Admit: 2018-07-28 | Discharge: 2018-07-29 | Payer: MEDICAID

## 2018-07-28 DIAGNOSIS — Z9483 Pancreas transplant status: Secondary | ICD-10-CM

## 2018-07-28 DIAGNOSIS — D899 Disorder involving the immune mechanism, unspecified: Principal | ICD-10-CM

## 2018-07-28 DIAGNOSIS — E1169 Type 2 diabetes mellitus with other specified complication: Secondary | ICD-10-CM

## 2018-07-28 DIAGNOSIS — N183 Chronic kidney disease, stage 3 (moderate): Secondary | ICD-10-CM

## 2018-07-28 DIAGNOSIS — Z79899 Other long term (current) drug therapy: Secondary | ICD-10-CM

## 2018-07-28 DIAGNOSIS — D631 Anemia in chronic kidney disease: Secondary | ICD-10-CM

## 2018-07-28 DIAGNOSIS — Z940 Kidney transplant status: Secondary | ICD-10-CM

## 2018-07-31 ENCOUNTER — Encounter: Admit: 2018-07-31 | Discharge: 2018-08-01 | Payer: MEDICAID | Attending: Podiatrist | Primary: Podiatrist

## 2018-07-31 DIAGNOSIS — L97519 Non-pressure chronic ulcer of other part of right foot with unspecified severity: Secondary | ICD-10-CM

## 2018-07-31 DIAGNOSIS — M14671 Charcot's joint, right ankle and foot: Secondary | ICD-10-CM

## 2018-07-31 DIAGNOSIS — E11621 Type 2 diabetes mellitus with foot ulcer: Principal | ICD-10-CM

## 2018-07-31 DIAGNOSIS — M86671 Other chronic osteomyelitis, right ankle and foot: Secondary | ICD-10-CM

## 2018-08-11 ENCOUNTER — Ambulatory Visit: Admit: 2018-08-11 | Discharge: 2018-08-12 | Payer: MEDICAID | Attending: Podiatrist | Primary: Podiatrist

## 2018-08-11 DIAGNOSIS — M25374 Other instability, right foot: Principal | ICD-10-CM

## 2018-08-11 DIAGNOSIS — E11621 Type 2 diabetes mellitus with foot ulcer: Secondary | ICD-10-CM

## 2018-08-11 DIAGNOSIS — M25371 Other instability, right ankle: Secondary | ICD-10-CM

## 2018-08-11 DIAGNOSIS — E11610 Type 2 diabetes mellitus with diabetic neuropathic arthropathy: Secondary | ICD-10-CM

## 2018-08-11 DIAGNOSIS — L97519 Non-pressure chronic ulcer of other part of right foot with unspecified severity: Secondary | ICD-10-CM

## 2018-08-20 ENCOUNTER — Non-Acute Institutional Stay: Admit: 2018-08-20 | Discharge: 2018-08-21 | Payer: MEDICAID

## 2018-08-20 DIAGNOSIS — D631 Anemia in chronic kidney disease: Secondary | ICD-10-CM

## 2018-08-20 DIAGNOSIS — Z79899 Other long term (current) drug therapy: Secondary | ICD-10-CM

## 2018-08-20 DIAGNOSIS — N183 Chronic kidney disease, stage 3 (moderate): Secondary | ICD-10-CM

## 2018-08-20 DIAGNOSIS — Z9483 Pancreas transplant status: Secondary | ICD-10-CM

## 2018-08-20 DIAGNOSIS — Z940 Kidney transplant status: Principal | ICD-10-CM

## 2018-08-21 MED FILL — MYFORTIC 180 MG TABLET,DELAYED RELEASE: ORAL | 30 days supply | Qty: 180 | Fill #1

## 2018-08-21 MED FILL — PROGRAF 1 MG CAPSULE: 30 days supply | Qty: 180 | Fill #2 | Status: AC

## 2018-08-21 MED FILL — MYFORTIC 180 MG TABLET,DELAYED RELEASE: 30 days supply | Qty: 180 | Fill #1 | Status: AC

## 2018-08-21 MED FILL — PROGRAF 1 MG CAPSULE: 30 days supply | Qty: 180 | Fill #2

## 2018-09-01 ENCOUNTER — Encounter: Admit: 2018-09-01 | Discharge: 2018-09-02 | Payer: MEDICAID

## 2018-09-01 DIAGNOSIS — D899 Disorder involving the immune mechanism, unspecified: Principal | ICD-10-CM

## 2018-09-01 DIAGNOSIS — Z940 Kidney transplant status: Secondary | ICD-10-CM

## 2018-09-02 ENCOUNTER — Encounter: Admit: 2018-09-02 | Discharge: 2018-09-02 | Payer: MEDICAID

## 2018-09-02 ENCOUNTER — Encounter: Admit: 2018-09-02 | Discharge: 2018-09-02 | Payer: MEDICAID | Attending: Podiatrist | Primary: Podiatrist

## 2018-09-02 DIAGNOSIS — E11621 Type 2 diabetes mellitus with foot ulcer: Principal | ICD-10-CM

## 2018-09-02 DIAGNOSIS — L97413 Non-pressure chronic ulcer of right heel and midfoot with necrosis of muscle: Secondary | ICD-10-CM

## 2018-09-02 DIAGNOSIS — M869 Osteomyelitis, unspecified: Secondary | ICD-10-CM

## 2018-09-09 ENCOUNTER — Encounter: Admit: 2018-09-09 | Discharge: 2018-09-10 | Payer: MEDICAID

## 2018-09-09 DIAGNOSIS — E11621 Type 2 diabetes mellitus with foot ulcer: Principal | ICD-10-CM

## 2018-09-12 ENCOUNTER — Encounter (INDEPENDENT_AMBULATORY_CARE_PROVIDER_SITE_OTHER): Payer: Medicare Other

## 2018-09-12 ENCOUNTER — Ambulatory Visit (INDEPENDENT_AMBULATORY_CARE_PROVIDER_SITE_OTHER): Payer: Medicare Other | Admitting: Vascular Surgery

## 2018-09-15 ENCOUNTER — Encounter: Admit: 2018-09-15 | Discharge: 2018-09-16 | Payer: MEDICAID | Attending: Registered" | Primary: Registered"

## 2018-09-15 DIAGNOSIS — Z940 Kidney transplant status: Principal | ICD-10-CM

## 2018-09-17 ENCOUNTER — Other Ambulatory Visit: Payer: Self-pay | Admitting: Ophthalmology

## 2018-09-17 DIAGNOSIS — R519 Headache, unspecified: Secondary | ICD-10-CM

## 2018-09-17 DIAGNOSIS — H5712 Ocular pain, left eye: Secondary | ICD-10-CM

## 2018-09-18 ENCOUNTER — Encounter: Admit: 2018-09-18 | Discharge: 2018-09-18 | Payer: MEDICAID

## 2018-09-18 ENCOUNTER — Encounter: Admit: 2018-09-18 | Discharge: 2018-09-18 | Payer: MEDICAID | Attending: Podiatrist | Primary: Podiatrist

## 2018-09-18 DIAGNOSIS — I739 Peripheral vascular disease, unspecified: Principal | ICD-10-CM

## 2018-09-18 DIAGNOSIS — L97413 Non-pressure chronic ulcer of right heel and midfoot with necrosis of muscle: Secondary | ICD-10-CM

## 2018-09-18 DIAGNOSIS — E11621 Type 2 diabetes mellitus with foot ulcer: Principal | ICD-10-CM

## 2018-09-19 MED FILL — MYFORTIC 180 MG TABLET,DELAYED RELEASE: 30 days supply | Qty: 180 | Fill #2 | Status: AC

## 2018-09-19 MED FILL — MYFORTIC 180 MG TABLET,DELAYED RELEASE: ORAL | 30 days supply | Qty: 180 | Fill #2

## 2018-09-19 MED FILL — PROGRAF 1 MG CAPSULE: 30 days supply | Qty: 180 | Fill #3

## 2018-09-19 MED FILL — PROGRAF 1 MG CAPSULE: 30 days supply | Qty: 180 | Fill #3 | Status: AC

## 2018-09-23 ENCOUNTER — Other Ambulatory Visit: Payer: Self-pay

## 2018-09-23 ENCOUNTER — Ambulatory Visit
Admission: RE | Admit: 2018-09-23 | Discharge: 2018-09-23 | Disposition: A | Discharge status: Home / Self Care | Payer: Medicare Other | Source: Ambulatory Visit | Attending: Ophthalmology | Admitting: Ophthalmology

## 2018-09-23 DIAGNOSIS — H5712 Ocular pain, left eye: Secondary | ICD-10-CM | POA: Insufficient documentation

## 2018-09-23 DIAGNOSIS — R519 Headache, unspecified: Secondary | ICD-10-CM

## 2018-09-23 DIAGNOSIS — R51 Headache: Secondary | ICD-10-CM | POA: Diagnosis not present

## 2018-09-29 ENCOUNTER — Encounter: Admit: 2018-09-29 | Discharge: 2018-09-29 | Payer: MEDICAID | Attending: Vascular Surgery | Primary: Vascular Surgery

## 2018-09-29 ENCOUNTER — Encounter: Admit: 2018-09-29 | Discharge: 2018-09-29 | Payer: MEDICAID

## 2018-09-29 DIAGNOSIS — I739 Peripheral vascular disease, unspecified: Principal | ICD-10-CM

## 2018-09-29 DIAGNOSIS — Z79899 Other long term (current) drug therapy: Secondary | ICD-10-CM

## 2018-09-29 DIAGNOSIS — Z9483 Pancreas transplant status: Secondary | ICD-10-CM

## 2018-09-29 DIAGNOSIS — N183 Chronic kidney disease, stage 3 (moderate): Secondary | ICD-10-CM

## 2018-09-29 DIAGNOSIS — Z940 Kidney transplant status: Principal | ICD-10-CM

## 2018-09-29 DIAGNOSIS — D631 Anemia in chronic kidney disease: Secondary | ICD-10-CM

## 2018-10-02 MED ORDER — PROGRAF 1 MG CAPSULE
ORAL_CAPSULE | 11 refills | 0 days
Start: 2018-10-02 — End: 2018-10-08

## 2018-10-03 ENCOUNTER — Encounter: Admit: 2018-10-03 | Discharge: 2018-10-04 | Payer: MEDICAID | Attending: Nephrology | Primary: Nephrology

## 2018-10-03 MED ORDER — FLUTICASONE PROPIONATE 50 MCG/ACTUATION NASAL SPRAY,SUSPENSION
Freq: Every day | NASAL | 3 refills | 76.00000 days | Status: CP
Start: 2018-10-03 — End: 2019-10-03

## 2018-10-08 MED ORDER — PROGRAF 1 MG CAPSULE
ORAL_CAPSULE | Freq: Two times a day (BID) | ORAL | 11 refills | 30 days | Status: CP
Start: 2018-10-08 — End: 2018-10-21
  Filled 2018-10-13: qty 180, 30d supply, fill #0

## 2018-10-09 ENCOUNTER — Ambulatory Visit: Admit: 2018-10-09 | Discharge: 2018-10-09 | Payer: MEDICAID | Attending: Podiatrist | Primary: Podiatrist

## 2018-10-09 ENCOUNTER — Encounter: Admit: 2018-10-09 | Discharge: 2018-10-09 | Payer: MEDICAID

## 2018-10-09 DIAGNOSIS — Z940 Kidney transplant status: Principal | ICD-10-CM

## 2018-10-09 DIAGNOSIS — L97413 Non-pressure chronic ulcer of right heel and midfoot with necrosis of muscle: Secondary | ICD-10-CM

## 2018-10-09 DIAGNOSIS — E11621 Type 2 diabetes mellitus with foot ulcer: Principal | ICD-10-CM

## 2018-10-13 MED FILL — PROGRAF 1 MG CAPSULE: 30 days supply | Qty: 180 | Fill #0 | Status: AC

## 2018-10-13 MED FILL — MYFORTIC 180 MG TABLET,DELAYED RELEASE: ORAL | 30 days supply | Qty: 180 | Fill #3

## 2018-10-13 MED FILL — MYFORTIC 180 MG TABLET,DELAYED RELEASE: 30 days supply | Qty: 180 | Fill #3 | Status: AC

## 2018-10-15 MED ORDER — BUTALBITAL-ACETAMINOPHEN-CAFFEINE 50 MG-300 MG-40 MG CAPSULE
ORAL_CAPSULE | Freq: Four times a day (QID) | ORAL | 0 refills | 8 days | Status: CP | PRN
Start: 2018-10-15 — End: ?

## 2018-10-21 ENCOUNTER — Encounter: Admit: 2018-10-21 | Discharge: 2018-10-22 | Payer: MEDICAID

## 2018-10-21 DIAGNOSIS — E11621 Type 2 diabetes mellitus with foot ulcer: Secondary | ICD-10-CM

## 2018-10-21 DIAGNOSIS — E11610 Type 2 diabetes mellitus with diabetic neuropathic arthropathy: Secondary | ICD-10-CM

## 2018-10-21 DIAGNOSIS — L97413 Non-pressure chronic ulcer of right heel and midfoot with necrosis of muscle: Secondary | ICD-10-CM

## 2018-10-21 DIAGNOSIS — L97519 Non-pressure chronic ulcer of other part of right foot with unspecified severity: Secondary | ICD-10-CM

## 2018-10-21 MED ORDER — TACROLIMUS 1 MG CAPSULE
ORAL_CAPSULE | Freq: Two times a day (BID) | ORAL | 11 refills | 30 days | Status: CP
Start: 2018-10-21 — End: ?
  Filled 2018-11-10: qty 180, 30d supply, fill #0

## 2018-10-21 MED ORDER — SILVER SULFADIAZINE 1 % TOPICAL CREAM
Freq: Every day | TOPICAL | 0 refills | 0 days | Status: CP
Start: 2018-10-21 — End: 2019-10-21

## 2018-10-23 ENCOUNTER — Encounter: Admit: 2018-10-23 | Discharge: 2018-10-24 | Payer: MEDICAID | Attending: Medical | Primary: Medical

## 2018-10-23 DIAGNOSIS — N183 Chronic kidney disease, stage 3 (moderate): Secondary | ICD-10-CM

## 2018-10-23 DIAGNOSIS — D631 Anemia in chronic kidney disease: Secondary | ICD-10-CM

## 2018-10-23 DIAGNOSIS — Z01818 Encounter for other preprocedural examination: Principal | ICD-10-CM

## 2018-10-23 DIAGNOSIS — D899 Disorder involving the immune mechanism, unspecified: Secondary | ICD-10-CM

## 2018-10-23 DIAGNOSIS — I10 Essential (primary) hypertension: Secondary | ICD-10-CM

## 2018-10-23 DIAGNOSIS — E11610 Type 2 diabetes mellitus with diabetic neuropathic arthropathy: Secondary | ICD-10-CM

## 2018-10-28 ENCOUNTER — Ambulatory Visit: Admit: 2018-10-28 | Discharge: 2018-10-28 | Payer: MEDICAID

## 2018-10-28 DIAGNOSIS — Z940 Kidney transplant status: Secondary | ICD-10-CM

## 2018-10-28 DIAGNOSIS — N189 Chronic kidney disease, unspecified: Secondary | ICD-10-CM

## 2018-10-28 DIAGNOSIS — E11628 Type 2 diabetes mellitus with other skin complications: Secondary | ICD-10-CM

## 2018-10-28 DIAGNOSIS — Z792 Long term (current) use of antibiotics: Secondary | ICD-10-CM

## 2018-10-28 DIAGNOSIS — S91301D Unspecified open wound, right foot, subsequent encounter: Secondary | ICD-10-CM

## 2018-10-28 DIAGNOSIS — E1142 Type 2 diabetes mellitus with diabetic polyneuropathy: Secondary | ICD-10-CM

## 2018-10-28 DIAGNOSIS — E1122 Type 2 diabetes mellitus with diabetic chronic kidney disease: Secondary | ICD-10-CM

## 2018-10-28 DIAGNOSIS — I129 Hypertensive chronic kidney disease with stage 1 through stage 4 chronic kidney disease, or unspecified chronic kidney disease: Secondary | ICD-10-CM

## 2018-10-28 DIAGNOSIS — E11610 Type 2 diabetes mellitus with diabetic neuropathic arthropathy: Secondary | ICD-10-CM

## 2018-10-28 DIAGNOSIS — M25374 Other instability, right foot: Secondary | ICD-10-CM

## 2018-10-28 DIAGNOSIS — L97413 Non-pressure chronic ulcer of right heel and midfoot with necrosis of muscle: Secondary | ICD-10-CM

## 2018-10-28 DIAGNOSIS — E11621 Type 2 diabetes mellitus with foot ulcer: Secondary | ICD-10-CM

## 2018-10-28 DIAGNOSIS — I739 Peripheral vascular disease, unspecified: Secondary | ICD-10-CM

## 2018-10-28 DIAGNOSIS — Z9483 Pancreas transplant status: Secondary | ICD-10-CM

## 2018-10-28 DIAGNOSIS — E785 Hyperlipidemia, unspecified: Secondary | ICD-10-CM

## 2018-10-28 DIAGNOSIS — Z89421 Acquired absence of other right toe(s): Secondary | ICD-10-CM

## 2018-10-28 DIAGNOSIS — E1151 Type 2 diabetes mellitus with diabetic peripheral angiopathy without gangrene: Secondary | ICD-10-CM

## 2018-10-30 ENCOUNTER — Encounter: Admit: 2018-10-30 | Discharge: 2018-10-31 | Payer: MEDICAID

## 2018-10-30 ENCOUNTER — Ambulatory Visit: Admit: 2018-10-30 | Discharge: 2018-10-31 | Payer: MEDICAID

## 2018-10-30 DIAGNOSIS — Z79899 Other long term (current) drug therapy: Secondary | ICD-10-CM

## 2018-10-30 DIAGNOSIS — E11621 Type 2 diabetes mellitus with foot ulcer: Secondary | ICD-10-CM

## 2018-10-30 DIAGNOSIS — Z940 Kidney transplant status: Secondary | ICD-10-CM

## 2018-10-30 DIAGNOSIS — M25374 Other instability, right foot: Secondary | ICD-10-CM

## 2018-10-30 DIAGNOSIS — E11610 Type 2 diabetes mellitus with diabetic neuropathic arthropathy: Secondary | ICD-10-CM

## 2018-10-30 DIAGNOSIS — E1151 Type 2 diabetes mellitus with diabetic peripheral angiopathy without gangrene: Secondary | ICD-10-CM

## 2018-10-30 DIAGNOSIS — L97413 Non-pressure chronic ulcer of right heel and midfoot with necrosis of muscle: Secondary | ICD-10-CM

## 2018-10-30 DIAGNOSIS — D631 Anemia in chronic kidney disease: Secondary | ICD-10-CM

## 2018-10-30 DIAGNOSIS — I739 Peripheral vascular disease, unspecified: Secondary | ICD-10-CM

## 2018-10-30 DIAGNOSIS — N183 Chronic kidney disease, stage 3 (moderate): Secondary | ICD-10-CM

## 2018-10-30 DIAGNOSIS — Z9483 Pancreas transplant status: Secondary | ICD-10-CM

## 2018-11-03 DIAGNOSIS — Z792 Long term (current) use of antibiotics: Secondary | ICD-10-CM

## 2018-11-03 DIAGNOSIS — L97519 Non-pressure chronic ulcer of other part of right foot with unspecified severity: Secondary | ICD-10-CM

## 2018-11-03 DIAGNOSIS — Z8669 Personal history of other diseases of the nervous system and sense organs: Secondary | ICD-10-CM

## 2018-11-03 DIAGNOSIS — Z89421 Acquired absence of other right toe(s): Secondary | ICD-10-CM

## 2018-11-03 DIAGNOSIS — Z20828 Contact with and (suspected) exposure to other viral communicable diseases: Secondary | ICD-10-CM

## 2018-11-03 DIAGNOSIS — Z9483 Pancreas transplant status: Secondary | ICD-10-CM

## 2018-11-03 DIAGNOSIS — E1069 Type 1 diabetes mellitus with other specified complication: Secondary | ICD-10-CM

## 2018-11-03 DIAGNOSIS — G629 Polyneuropathy, unspecified: Secondary | ICD-10-CM

## 2018-11-03 DIAGNOSIS — M868X7 Other osteomyelitis, ankle and foot: Secondary | ICD-10-CM

## 2018-11-03 DIAGNOSIS — E10621 Type 1 diabetes mellitus with foot ulcer: Secondary | ICD-10-CM

## 2018-11-03 DIAGNOSIS — Z9841 Cataract extraction status, right eye: Secondary | ICD-10-CM

## 2018-11-03 DIAGNOSIS — S91301A Unspecified open wound, right foot, initial encounter: Secondary | ICD-10-CM

## 2018-11-03 DIAGNOSIS — E785 Hyperlipidemia, unspecified: Secondary | ICD-10-CM

## 2018-11-03 DIAGNOSIS — I10 Essential (primary) hypertension: Secondary | ICD-10-CM

## 2018-11-03 DIAGNOSIS — Z940 Kidney transplant status: Secondary | ICD-10-CM

## 2018-11-03 DIAGNOSIS — Z9842 Cataract extraction status, left eye: Secondary | ICD-10-CM

## 2018-11-03 DIAGNOSIS — K219 Gastro-esophageal reflux disease without esophagitis: Secondary | ICD-10-CM

## 2018-11-03 DIAGNOSIS — E10610 Type 1 diabetes mellitus with diabetic neuropathic arthropathy: Secondary | ICD-10-CM

## 2018-11-03 DIAGNOSIS — Z1159 Encounter for screening for other viral diseases: Secondary | ICD-10-CM

## 2018-11-03 DIAGNOSIS — I739 Peripheral vascular disease, unspecified: Secondary | ICD-10-CM

## 2018-11-04 DIAGNOSIS — E1069 Type 1 diabetes mellitus with other specified complication: Secondary | ICD-10-CM

## 2018-11-04 DIAGNOSIS — Z9841 Cataract extraction status, right eye: Secondary | ICD-10-CM

## 2018-11-04 DIAGNOSIS — I739 Peripheral vascular disease, unspecified: Secondary | ICD-10-CM

## 2018-11-04 DIAGNOSIS — E785 Hyperlipidemia, unspecified: Secondary | ICD-10-CM

## 2018-11-04 DIAGNOSIS — G629 Polyneuropathy, unspecified: Secondary | ICD-10-CM

## 2018-11-04 DIAGNOSIS — Z9483 Pancreas transplant status: Secondary | ICD-10-CM

## 2018-11-04 DIAGNOSIS — L97519 Non-pressure chronic ulcer of other part of right foot with unspecified severity: Secondary | ICD-10-CM

## 2018-11-04 DIAGNOSIS — Z8669 Personal history of other diseases of the nervous system and sense organs: Secondary | ICD-10-CM

## 2018-11-04 DIAGNOSIS — E10610 Type 1 diabetes mellitus with diabetic neuropathic arthropathy: Secondary | ICD-10-CM

## 2018-11-04 DIAGNOSIS — Z9842 Cataract extraction status, left eye: Secondary | ICD-10-CM

## 2018-11-04 DIAGNOSIS — K219 Gastro-esophageal reflux disease without esophagitis: Secondary | ICD-10-CM

## 2018-11-04 DIAGNOSIS — Z940 Kidney transplant status: Secondary | ICD-10-CM

## 2018-11-04 DIAGNOSIS — Z89421 Acquired absence of other right toe(s): Secondary | ICD-10-CM

## 2018-11-04 DIAGNOSIS — Z20828 Contact with and (suspected) exposure to other viral communicable diseases: Secondary | ICD-10-CM

## 2018-11-04 DIAGNOSIS — M868X7 Other osteomyelitis, ankle and foot: Secondary | ICD-10-CM

## 2018-11-04 DIAGNOSIS — Z792 Long term (current) use of antibiotics: Secondary | ICD-10-CM

## 2018-11-04 DIAGNOSIS — I10 Essential (primary) hypertension: Secondary | ICD-10-CM

## 2018-11-04 DIAGNOSIS — E10621 Type 1 diabetes mellitus with foot ulcer: Secondary | ICD-10-CM

## 2018-11-04 DIAGNOSIS — S91301A Unspecified open wound, right foot, initial encounter: Secondary | ICD-10-CM

## 2018-11-05 ENCOUNTER — Encounter
Admit: 2018-11-05 | Discharge: 2018-11-13 | Disposition: A | Discharge status: Home Health | Payer: MEDICAID | Attending: Pharmacist | Admitting: Vascular Surgery

## 2018-11-05 ENCOUNTER — Ambulatory Visit
Admit: 2018-11-05 | Discharge: 2018-11-13 | Disposition: A | Discharge status: Home Health | Payer: MEDICAID | Admitting: Vascular Surgery

## 2018-11-05 ENCOUNTER — Ambulatory Visit
Admit: 2018-11-05 | Discharge: 2018-11-13 | Disposition: A | Discharge status: Home Health | Payer: MEDICAID | Attending: Family | Admitting: Vascular Surgery

## 2018-11-05 DIAGNOSIS — G629 Polyneuropathy, unspecified: Secondary | ICD-10-CM

## 2018-11-05 DIAGNOSIS — Z9483 Pancreas transplant status: Secondary | ICD-10-CM

## 2018-11-05 DIAGNOSIS — Z792 Long term (current) use of antibiotics: Secondary | ICD-10-CM

## 2018-11-05 DIAGNOSIS — E10621 Type 1 diabetes mellitus with foot ulcer: Secondary | ICD-10-CM

## 2018-11-05 DIAGNOSIS — Z89421 Acquired absence of other right toe(s): Secondary | ICD-10-CM

## 2018-11-05 DIAGNOSIS — E785 Hyperlipidemia, unspecified: Secondary | ICD-10-CM

## 2018-11-05 DIAGNOSIS — I739 Peripheral vascular disease, unspecified: Secondary | ICD-10-CM

## 2018-11-05 DIAGNOSIS — I10 Essential (primary) hypertension: Secondary | ICD-10-CM

## 2018-11-05 DIAGNOSIS — Z940 Kidney transplant status: Secondary | ICD-10-CM

## 2018-11-05 DIAGNOSIS — Z8669 Personal history of other diseases of the nervous system and sense organs: Secondary | ICD-10-CM

## 2018-11-05 DIAGNOSIS — K219 Gastro-esophageal reflux disease without esophagitis: Secondary | ICD-10-CM

## 2018-11-05 DIAGNOSIS — E10610 Type 1 diabetes mellitus with diabetic neuropathic arthropathy: Secondary | ICD-10-CM

## 2018-11-05 DIAGNOSIS — E1069 Type 1 diabetes mellitus with other specified complication: Secondary | ICD-10-CM

## 2018-11-05 DIAGNOSIS — Z9842 Cataract extraction status, left eye: Secondary | ICD-10-CM

## 2018-11-05 DIAGNOSIS — M868X7 Other osteomyelitis, ankle and foot: Secondary | ICD-10-CM

## 2018-11-05 DIAGNOSIS — Z9841 Cataract extraction status, right eye: Secondary | ICD-10-CM

## 2018-11-05 DIAGNOSIS — Z20828 Contact with and (suspected) exposure to other viral communicable diseases: Secondary | ICD-10-CM

## 2018-11-05 DIAGNOSIS — L97519 Non-pressure chronic ulcer of other part of right foot with unspecified severity: Secondary | ICD-10-CM

## 2018-11-05 DIAGNOSIS — S91301A Unspecified open wound, right foot, initial encounter: Secondary | ICD-10-CM

## 2018-11-06 DIAGNOSIS — L97519 Non-pressure chronic ulcer of other part of right foot with unspecified severity: Secondary | ICD-10-CM

## 2018-11-06 DIAGNOSIS — Z8669 Personal history of other diseases of the nervous system and sense organs: Secondary | ICD-10-CM

## 2018-11-06 DIAGNOSIS — G629 Polyneuropathy, unspecified: Secondary | ICD-10-CM

## 2018-11-06 DIAGNOSIS — E785 Hyperlipidemia, unspecified: Secondary | ICD-10-CM

## 2018-11-06 DIAGNOSIS — E10610 Type 1 diabetes mellitus with diabetic neuropathic arthropathy: Secondary | ICD-10-CM

## 2018-11-06 DIAGNOSIS — Z89421 Acquired absence of other right toe(s): Secondary | ICD-10-CM

## 2018-11-06 DIAGNOSIS — S91301A Unspecified open wound, right foot, initial encounter: Secondary | ICD-10-CM

## 2018-11-06 DIAGNOSIS — Z940 Kidney transplant status: Secondary | ICD-10-CM

## 2018-11-06 DIAGNOSIS — M868X7 Other osteomyelitis, ankle and foot: Secondary | ICD-10-CM

## 2018-11-06 DIAGNOSIS — K219 Gastro-esophageal reflux disease without esophagitis: Secondary | ICD-10-CM

## 2018-11-06 DIAGNOSIS — I739 Peripheral vascular disease, unspecified: Secondary | ICD-10-CM

## 2018-11-06 DIAGNOSIS — Z792 Long term (current) use of antibiotics: Secondary | ICD-10-CM

## 2018-11-06 DIAGNOSIS — Z20828 Contact with and (suspected) exposure to other viral communicable diseases: Secondary | ICD-10-CM

## 2018-11-06 DIAGNOSIS — Z9842 Cataract extraction status, left eye: Secondary | ICD-10-CM

## 2018-11-06 DIAGNOSIS — Z9841 Cataract extraction status, right eye: Secondary | ICD-10-CM

## 2018-11-06 DIAGNOSIS — E1069 Type 1 diabetes mellitus with other specified complication: Secondary | ICD-10-CM

## 2018-11-06 DIAGNOSIS — Z9483 Pancreas transplant status: Secondary | ICD-10-CM

## 2018-11-06 DIAGNOSIS — E10621 Type 1 diabetes mellitus with foot ulcer: Secondary | ICD-10-CM

## 2018-11-06 DIAGNOSIS — I10 Essential (primary) hypertension: Secondary | ICD-10-CM

## 2018-11-09 DIAGNOSIS — Z9841 Cataract extraction status, right eye: Secondary | ICD-10-CM

## 2018-11-09 DIAGNOSIS — Z9842 Cataract extraction status, left eye: Secondary | ICD-10-CM

## 2018-11-09 DIAGNOSIS — Z9483 Pancreas transplant status: Secondary | ICD-10-CM

## 2018-11-09 DIAGNOSIS — K219 Gastro-esophageal reflux disease without esophagitis: Secondary | ICD-10-CM

## 2018-11-09 DIAGNOSIS — Z8669 Personal history of other diseases of the nervous system and sense organs: Secondary | ICD-10-CM

## 2018-11-09 DIAGNOSIS — Z940 Kidney transplant status: Secondary | ICD-10-CM

## 2018-11-09 DIAGNOSIS — Z792 Long term (current) use of antibiotics: Secondary | ICD-10-CM

## 2018-11-09 DIAGNOSIS — L97519 Non-pressure chronic ulcer of other part of right foot with unspecified severity: Secondary | ICD-10-CM

## 2018-11-09 DIAGNOSIS — E10621 Type 1 diabetes mellitus with foot ulcer: Secondary | ICD-10-CM

## 2018-11-09 DIAGNOSIS — Z89421 Acquired absence of other right toe(s): Secondary | ICD-10-CM

## 2018-11-09 DIAGNOSIS — I10 Essential (primary) hypertension: Secondary | ICD-10-CM

## 2018-11-09 DIAGNOSIS — Z20828 Contact with and (suspected) exposure to other viral communicable diseases: Secondary | ICD-10-CM

## 2018-11-09 DIAGNOSIS — G629 Polyneuropathy, unspecified: Secondary | ICD-10-CM

## 2018-11-09 DIAGNOSIS — E1069 Type 1 diabetes mellitus with other specified complication: Secondary | ICD-10-CM

## 2018-11-09 DIAGNOSIS — I739 Peripheral vascular disease, unspecified: Secondary | ICD-10-CM

## 2018-11-09 DIAGNOSIS — E785 Hyperlipidemia, unspecified: Secondary | ICD-10-CM

## 2018-11-09 DIAGNOSIS — E10610 Type 1 diabetes mellitus with diabetic neuropathic arthropathy: Secondary | ICD-10-CM

## 2018-11-09 DIAGNOSIS — M868X7 Other osteomyelitis, ankle and foot: Secondary | ICD-10-CM

## 2018-11-09 DIAGNOSIS — S91301A Unspecified open wound, right foot, initial encounter: Secondary | ICD-10-CM

## 2018-11-10 DIAGNOSIS — G629 Polyneuropathy, unspecified: Secondary | ICD-10-CM

## 2018-11-10 DIAGNOSIS — Z9841 Cataract extraction status, right eye: Secondary | ICD-10-CM

## 2018-11-10 DIAGNOSIS — Z792 Long term (current) use of antibiotics: Secondary | ICD-10-CM

## 2018-11-10 DIAGNOSIS — K219 Gastro-esophageal reflux disease without esophagitis: Secondary | ICD-10-CM

## 2018-11-10 DIAGNOSIS — I10 Essential (primary) hypertension: Secondary | ICD-10-CM

## 2018-11-10 DIAGNOSIS — E785 Hyperlipidemia, unspecified: Secondary | ICD-10-CM

## 2018-11-10 DIAGNOSIS — M868X7 Other osteomyelitis, ankle and foot: Secondary | ICD-10-CM

## 2018-11-10 DIAGNOSIS — I739 Peripheral vascular disease, unspecified: Secondary | ICD-10-CM

## 2018-11-10 DIAGNOSIS — Z8669 Personal history of other diseases of the nervous system and sense organs: Secondary | ICD-10-CM

## 2018-11-10 DIAGNOSIS — E1069 Type 1 diabetes mellitus with other specified complication: Secondary | ICD-10-CM

## 2018-11-10 DIAGNOSIS — Z9483 Pancreas transplant status: Secondary | ICD-10-CM

## 2018-11-10 DIAGNOSIS — E10610 Type 1 diabetes mellitus with diabetic neuropathic arthropathy: Secondary | ICD-10-CM

## 2018-11-10 DIAGNOSIS — E10621 Type 1 diabetes mellitus with foot ulcer: Secondary | ICD-10-CM

## 2018-11-10 DIAGNOSIS — Z20828 Contact with and (suspected) exposure to other viral communicable diseases: Secondary | ICD-10-CM

## 2018-11-10 DIAGNOSIS — Z9842 Cataract extraction status, left eye: Secondary | ICD-10-CM

## 2018-11-10 DIAGNOSIS — Z940 Kidney transplant status: Secondary | ICD-10-CM

## 2018-11-10 DIAGNOSIS — Z89421 Acquired absence of other right toe(s): Secondary | ICD-10-CM

## 2018-11-10 DIAGNOSIS — L97519 Non-pressure chronic ulcer of other part of right foot with unspecified severity: Secondary | ICD-10-CM

## 2018-11-10 DIAGNOSIS — S91301A Unspecified open wound, right foot, initial encounter: Secondary | ICD-10-CM

## 2018-11-10 MED FILL — MYFORTIC 180 MG TABLET,DELAYED RELEASE: 30 days supply | Qty: 180 | Fill #4 | Status: AC

## 2018-11-10 MED FILL — TACROLIMUS 1 MG CAPSULE: 30 days supply | Qty: 180 | Fill #0 | Status: AC

## 2018-11-10 MED FILL — MYFORTIC 180 MG TABLET,DELAYED RELEASE: ORAL | 30 days supply | Qty: 180 | Fill #4

## 2018-11-11 MED ORDER — CEFTAZIDIME CUSTOM DOSE IV
Freq: Two times a day (BID) | INTRAVENOUS | 0 refills | 0.00000 days | Status: CP
Start: 2018-11-11 — End: 2018-11-12

## 2018-11-11 MED ORDER — DAPTOMYCIN (CUBICIN) IVPB IN 50 ML
INTRAVENOUS | 0 refills | 0.00000 days | Status: CP
Start: 2018-11-11 — End: 2018-12-22

## 2018-11-12 DIAGNOSIS — Z89421 Acquired absence of other right toe(s): Secondary | ICD-10-CM

## 2018-11-12 DIAGNOSIS — L97519 Non-pressure chronic ulcer of other part of right foot with unspecified severity: Secondary | ICD-10-CM

## 2018-11-12 DIAGNOSIS — E1069 Type 1 diabetes mellitus with other specified complication: Secondary | ICD-10-CM

## 2018-11-12 DIAGNOSIS — M868X7 Other osteomyelitis, ankle and foot: Secondary | ICD-10-CM

## 2018-11-12 DIAGNOSIS — Z9483 Pancreas transplant status: Secondary | ICD-10-CM

## 2018-11-12 DIAGNOSIS — E10621 Type 1 diabetes mellitus with foot ulcer: Secondary | ICD-10-CM

## 2018-11-12 DIAGNOSIS — Z8669 Personal history of other diseases of the nervous system and sense organs: Secondary | ICD-10-CM

## 2018-11-12 DIAGNOSIS — Z9841 Cataract extraction status, right eye: Secondary | ICD-10-CM

## 2018-11-12 DIAGNOSIS — I739 Peripheral vascular disease, unspecified: Secondary | ICD-10-CM

## 2018-11-12 DIAGNOSIS — K219 Gastro-esophageal reflux disease without esophagitis: Secondary | ICD-10-CM

## 2018-11-12 DIAGNOSIS — E785 Hyperlipidemia, unspecified: Secondary | ICD-10-CM

## 2018-11-12 DIAGNOSIS — Z940 Kidney transplant status: Secondary | ICD-10-CM

## 2018-11-12 DIAGNOSIS — Z20828 Contact with and (suspected) exposure to other viral communicable diseases: Secondary | ICD-10-CM

## 2018-11-12 DIAGNOSIS — S91301A Unspecified open wound, right foot, initial encounter: Secondary | ICD-10-CM

## 2018-11-12 DIAGNOSIS — I10 Essential (primary) hypertension: Secondary | ICD-10-CM

## 2018-11-12 DIAGNOSIS — G629 Polyneuropathy, unspecified: Secondary | ICD-10-CM

## 2018-11-12 DIAGNOSIS — Z9842 Cataract extraction status, left eye: Secondary | ICD-10-CM

## 2018-11-12 DIAGNOSIS — Z792 Long term (current) use of antibiotics: Secondary | ICD-10-CM

## 2018-11-12 DIAGNOSIS — E10610 Type 1 diabetes mellitus with diabetic neuropathic arthropathy: Secondary | ICD-10-CM

## 2018-11-12 MED ORDER — CEFTAZIDIME CUSTOM DOSE IV
Freq: Two times a day (BID) | INTRAVENOUS | 0 refills | 0.00000 days | Status: CP
Start: 2018-11-12 — End: 2018-12-22

## 2018-11-13 MED ORDER — OXYCODONE 5 MG TABLET
ORAL_TABLET | Freq: Four times a day (QID) | ORAL | 0 refills | 5 days | Status: CP | PRN
Start: 2018-11-13 — End: 2018-11-18

## 2018-11-14 ENCOUNTER — Encounter: Admit: 2018-11-14 | Discharge: 2018-12-11 | Payer: MEDICAID

## 2018-11-14 DIAGNOSIS — Z6836 Body mass index (BMI) 36.0-36.9, adult: Secondary | ICD-10-CM

## 2018-11-14 DIAGNOSIS — E1043 Type 1 diabetes mellitus with diabetic autonomic (poly)neuropathy: Secondary | ICD-10-CM

## 2018-11-14 DIAGNOSIS — Z89421 Acquired absence of other right toe(s): Secondary | ICD-10-CM

## 2018-11-14 DIAGNOSIS — E1051 Type 1 diabetes mellitus with diabetic peripheral angiopathy without gangrene: Secondary | ICD-10-CM

## 2018-11-14 DIAGNOSIS — D631 Anemia in chronic kidney disease: Secondary | ICD-10-CM

## 2018-11-14 DIAGNOSIS — G5600 Carpal tunnel syndrome, unspecified upper limb: Secondary | ICD-10-CM

## 2018-11-14 DIAGNOSIS — E1022 Type 1 diabetes mellitus with diabetic chronic kidney disease: Secondary | ICD-10-CM

## 2018-11-14 DIAGNOSIS — Z452 Encounter for adjustment and management of vascular access device: Secondary | ICD-10-CM

## 2018-11-14 DIAGNOSIS — Z79899 Other long term (current) drug therapy: Secondary | ICD-10-CM

## 2018-11-14 DIAGNOSIS — Z792 Long term (current) use of antibiotics: Secondary | ICD-10-CM

## 2018-11-14 DIAGNOSIS — E10610 Type 1 diabetes mellitus with diabetic neuropathic arthropathy: Secondary | ICD-10-CM

## 2018-11-14 DIAGNOSIS — K3184 Gastroparesis: Secondary | ICD-10-CM

## 2018-11-14 DIAGNOSIS — L97519 Non-pressure chronic ulcer of other part of right foot with unspecified severity: Secondary | ICD-10-CM

## 2018-11-14 DIAGNOSIS — Z89411 Acquired absence of right great toe: Secondary | ICD-10-CM

## 2018-11-14 DIAGNOSIS — E10621 Type 1 diabetes mellitus with foot ulcer: Secondary | ICD-10-CM

## 2018-11-14 DIAGNOSIS — I129 Hypertensive chronic kidney disease with stage 1 through stage 4 chronic kidney disease, or unspecified chronic kidney disease: Secondary | ICD-10-CM

## 2018-11-14 DIAGNOSIS — N183 Chronic kidney disease, stage 3 (moderate): Secondary | ICD-10-CM

## 2018-11-14 DIAGNOSIS — Z940 Kidney transplant status: Secondary | ICD-10-CM

## 2018-11-14 DIAGNOSIS — E669 Obesity, unspecified: Secondary | ICD-10-CM

## 2018-11-18 ENCOUNTER — Other Ambulatory Visit: Admit: 2018-11-18 | Discharge: 2018-11-19 | Payer: MEDICAID

## 2018-11-18 DIAGNOSIS — E1043 Type 1 diabetes mellitus with diabetic autonomic (poly)neuropathy: Secondary | ICD-10-CM

## 2018-11-18 DIAGNOSIS — D631 Anemia in chronic kidney disease: Secondary | ICD-10-CM

## 2018-11-18 DIAGNOSIS — Z940 Kidney transplant status: Secondary | ICD-10-CM

## 2018-11-18 DIAGNOSIS — I129 Hypertensive chronic kidney disease with stage 1 through stage 4 chronic kidney disease, or unspecified chronic kidney disease: Secondary | ICD-10-CM

## 2018-11-18 DIAGNOSIS — N183 Chronic kidney disease, stage 3 (moderate): Secondary | ICD-10-CM

## 2018-11-18 DIAGNOSIS — E669 Obesity, unspecified: Secondary | ICD-10-CM

## 2018-11-18 DIAGNOSIS — Z89421 Acquired absence of other right toe(s): Secondary | ICD-10-CM

## 2018-11-18 DIAGNOSIS — K3184 Gastroparesis: Secondary | ICD-10-CM

## 2018-11-18 DIAGNOSIS — L97519 Non-pressure chronic ulcer of other part of right foot with unspecified severity: Secondary | ICD-10-CM

## 2018-11-18 DIAGNOSIS — E1051 Type 1 diabetes mellitus with diabetic peripheral angiopathy without gangrene: Secondary | ICD-10-CM

## 2018-11-18 DIAGNOSIS — Z452 Encounter for adjustment and management of vascular access device: Secondary | ICD-10-CM

## 2018-11-18 DIAGNOSIS — G5600 Carpal tunnel syndrome, unspecified upper limb: Secondary | ICD-10-CM

## 2018-11-18 DIAGNOSIS — Z89411 Acquired absence of right great toe: Secondary | ICD-10-CM

## 2018-11-18 DIAGNOSIS — E10610 Type 1 diabetes mellitus with diabetic neuropathic arthropathy: Secondary | ICD-10-CM

## 2018-11-18 DIAGNOSIS — E1022 Type 1 diabetes mellitus with diabetic chronic kidney disease: Secondary | ICD-10-CM

## 2018-11-18 DIAGNOSIS — Z6836 Body mass index (BMI) 36.0-36.9, adult: Secondary | ICD-10-CM

## 2018-11-18 DIAGNOSIS — Z79899 Other long term (current) drug therapy: Secondary | ICD-10-CM

## 2018-11-18 DIAGNOSIS — Z792 Long term (current) use of antibiotics: Secondary | ICD-10-CM

## 2018-11-18 DIAGNOSIS — E10621 Type 1 diabetes mellitus with foot ulcer: Secondary | ICD-10-CM

## 2018-11-18 DIAGNOSIS — A5216 Charcot's arthropathy (tabetic): Secondary | ICD-10-CM

## 2018-11-20 ENCOUNTER — Encounter: Admit: 2018-11-20 | Discharge: 2018-11-21 | Payer: MEDICAID

## 2018-11-20 DIAGNOSIS — E11610 Type 2 diabetes mellitus with diabetic neuropathic arthropathy: Secondary | ICD-10-CM

## 2018-11-20 DIAGNOSIS — Z09 Encounter for follow-up examination after completed treatment for conditions other than malignant neoplasm: Secondary | ICD-10-CM

## 2018-11-20 DIAGNOSIS — L97413 Non-pressure chronic ulcer of right heel and midfoot with necrosis of muscle: Secondary | ICD-10-CM

## 2018-11-20 DIAGNOSIS — E11621 Type 2 diabetes mellitus with foot ulcer: Secondary | ICD-10-CM

## 2018-11-24 ENCOUNTER — Encounter: Admit: 2018-11-24 | Discharge: 2018-11-24 | Payer: MEDICAID | Attending: Nephrology | Primary: Nephrology

## 2018-11-24 DIAGNOSIS — Z6836 Body mass index (BMI) 36.0-36.9, adult: Secondary | ICD-10-CM

## 2018-11-24 DIAGNOSIS — I129 Hypertensive chronic kidney disease with stage 1 through stage 4 chronic kidney disease, or unspecified chronic kidney disease: Secondary | ICD-10-CM

## 2018-11-24 DIAGNOSIS — L97519 Non-pressure chronic ulcer of other part of right foot with unspecified severity: Secondary | ICD-10-CM

## 2018-11-24 DIAGNOSIS — Z940 Kidney transplant status: Secondary | ICD-10-CM

## 2018-11-24 DIAGNOSIS — E10610 Type 1 diabetes mellitus with diabetic neuropathic arthropathy: Secondary | ICD-10-CM

## 2018-11-24 DIAGNOSIS — Z89421 Acquired absence of other right toe(s): Secondary | ICD-10-CM

## 2018-11-24 DIAGNOSIS — E669 Obesity, unspecified: Secondary | ICD-10-CM

## 2018-11-24 DIAGNOSIS — K3184 Gastroparesis: Secondary | ICD-10-CM

## 2018-11-24 DIAGNOSIS — Z89411 Acquired absence of right great toe: Secondary | ICD-10-CM

## 2018-11-24 DIAGNOSIS — G5600 Carpal tunnel syndrome, unspecified upper limb: Secondary | ICD-10-CM

## 2018-11-24 DIAGNOSIS — E1051 Type 1 diabetes mellitus with diabetic peripheral angiopathy without gangrene: Secondary | ICD-10-CM

## 2018-11-24 DIAGNOSIS — E10621 Type 1 diabetes mellitus with foot ulcer: Secondary | ICD-10-CM

## 2018-11-24 DIAGNOSIS — Z792 Long term (current) use of antibiotics: Secondary | ICD-10-CM

## 2018-11-24 DIAGNOSIS — D631 Anemia in chronic kidney disease: Secondary | ICD-10-CM

## 2018-11-24 DIAGNOSIS — N183 Chronic kidney disease, stage 3 (moderate): Secondary | ICD-10-CM

## 2018-11-24 DIAGNOSIS — E1043 Type 1 diabetes mellitus with diabetic autonomic (poly)neuropathy: Secondary | ICD-10-CM

## 2018-11-24 DIAGNOSIS — Z79899 Other long term (current) drug therapy: Secondary | ICD-10-CM

## 2018-11-24 DIAGNOSIS — Z452 Encounter for adjustment and management of vascular access device: Secondary | ICD-10-CM

## 2018-11-24 DIAGNOSIS — E1022 Type 1 diabetes mellitus with diabetic chronic kidney disease: Secondary | ICD-10-CM

## 2018-11-25 DIAGNOSIS — L97519 Non-pressure chronic ulcer of other part of right foot with unspecified severity: Secondary | ICD-10-CM

## 2018-11-25 DIAGNOSIS — Z89421 Acquired absence of other right toe(s): Secondary | ICD-10-CM

## 2018-11-25 DIAGNOSIS — E1043 Type 1 diabetes mellitus with diabetic autonomic (poly)neuropathy: Secondary | ICD-10-CM

## 2018-11-25 DIAGNOSIS — Z792 Long term (current) use of antibiotics: Secondary | ICD-10-CM

## 2018-11-25 DIAGNOSIS — E1022 Type 1 diabetes mellitus with diabetic chronic kidney disease: Secondary | ICD-10-CM

## 2018-11-25 DIAGNOSIS — G5600 Carpal tunnel syndrome, unspecified upper limb: Secondary | ICD-10-CM

## 2018-11-25 DIAGNOSIS — E669 Obesity, unspecified: Secondary | ICD-10-CM

## 2018-11-25 DIAGNOSIS — K3184 Gastroparesis: Secondary | ICD-10-CM

## 2018-11-25 DIAGNOSIS — D631 Anemia in chronic kidney disease: Secondary | ICD-10-CM

## 2018-11-25 DIAGNOSIS — Z89411 Acquired absence of right great toe: Secondary | ICD-10-CM

## 2018-11-25 DIAGNOSIS — E1051 Type 1 diabetes mellitus with diabetic peripheral angiopathy without gangrene: Secondary | ICD-10-CM

## 2018-11-25 DIAGNOSIS — Z79899 Other long term (current) drug therapy: Secondary | ICD-10-CM

## 2018-11-25 DIAGNOSIS — E10610 Type 1 diabetes mellitus with diabetic neuropathic arthropathy: Secondary | ICD-10-CM

## 2018-11-25 DIAGNOSIS — Z452 Encounter for adjustment and management of vascular access device: Secondary | ICD-10-CM

## 2018-11-25 DIAGNOSIS — E10621 Type 1 diabetes mellitus with foot ulcer: Secondary | ICD-10-CM

## 2018-11-25 DIAGNOSIS — Z940 Kidney transplant status: Secondary | ICD-10-CM

## 2018-11-25 DIAGNOSIS — Z6836 Body mass index (BMI) 36.0-36.9, adult: Secondary | ICD-10-CM

## 2018-11-25 DIAGNOSIS — I129 Hypertensive chronic kidney disease with stage 1 through stage 4 chronic kidney disease, or unspecified chronic kidney disease: Secondary | ICD-10-CM

## 2018-11-25 DIAGNOSIS — N183 Chronic kidney disease, stage 3 (moderate): Secondary | ICD-10-CM

## 2018-11-27 ENCOUNTER — Ambulatory Visit: Admit: 2018-11-27 | Discharge: 2018-11-28 | Payer: MEDICAID

## 2018-11-27 DIAGNOSIS — Z09 Encounter for follow-up examination after completed treatment for conditions other than malignant neoplasm: Secondary | ICD-10-CM

## 2018-11-27 DIAGNOSIS — T86822 Skin graft (allograft) (autograft) infection: Secondary | ICD-10-CM

## 2018-11-27 DIAGNOSIS — E11610 Type 2 diabetes mellitus with diabetic neuropathic arthropathy: Secondary | ICD-10-CM

## 2018-12-01 ENCOUNTER — Encounter: Admit: 2018-12-01 | Discharge: 2018-12-01 | Payer: MEDICAID | Attending: Nephrology | Primary: Nephrology

## 2018-12-01 DIAGNOSIS — Z89421 Acquired absence of other right toe(s): Secondary | ICD-10-CM

## 2018-12-01 DIAGNOSIS — Z792 Long term (current) use of antibiotics: Secondary | ICD-10-CM

## 2018-12-01 DIAGNOSIS — K3184 Gastroparesis: Secondary | ICD-10-CM

## 2018-12-01 DIAGNOSIS — Z89411 Acquired absence of right great toe: Secondary | ICD-10-CM

## 2018-12-01 DIAGNOSIS — D631 Anemia in chronic kidney disease: Secondary | ICD-10-CM

## 2018-12-01 DIAGNOSIS — E10621 Type 1 diabetes mellitus with foot ulcer: Secondary | ICD-10-CM

## 2018-12-01 DIAGNOSIS — E10610 Type 1 diabetes mellitus with diabetic neuropathic arthropathy: Secondary | ICD-10-CM

## 2018-12-01 DIAGNOSIS — E1043 Type 1 diabetes mellitus with diabetic autonomic (poly)neuropathy: Secondary | ICD-10-CM

## 2018-12-01 DIAGNOSIS — E1022 Type 1 diabetes mellitus with diabetic chronic kidney disease: Secondary | ICD-10-CM

## 2018-12-01 DIAGNOSIS — Z940 Kidney transplant status: Secondary | ICD-10-CM

## 2018-12-01 DIAGNOSIS — G5600 Carpal tunnel syndrome, unspecified upper limb: Secondary | ICD-10-CM

## 2018-12-01 DIAGNOSIS — L97519 Non-pressure chronic ulcer of other part of right foot with unspecified severity: Secondary | ICD-10-CM

## 2018-12-01 DIAGNOSIS — Z79899 Other long term (current) drug therapy: Secondary | ICD-10-CM

## 2018-12-01 DIAGNOSIS — I129 Hypertensive chronic kidney disease with stage 1 through stage 4 chronic kidney disease, or unspecified chronic kidney disease: Secondary | ICD-10-CM

## 2018-12-01 DIAGNOSIS — N183 Chronic kidney disease, stage 3 (moderate): Secondary | ICD-10-CM

## 2018-12-01 DIAGNOSIS — E669 Obesity, unspecified: Secondary | ICD-10-CM

## 2018-12-01 DIAGNOSIS — Z6836 Body mass index (BMI) 36.0-36.9, adult: Secondary | ICD-10-CM

## 2018-12-01 DIAGNOSIS — Z452 Encounter for adjustment and management of vascular access device: Secondary | ICD-10-CM

## 2018-12-01 DIAGNOSIS — E1051 Type 1 diabetes mellitus with diabetic peripheral angiopathy without gangrene: Secondary | ICD-10-CM

## 2018-12-02 DIAGNOSIS — N183 Chronic kidney disease, stage 3 (moderate): Secondary | ICD-10-CM

## 2018-12-02 DIAGNOSIS — E10610 Type 1 diabetes mellitus with diabetic neuropathic arthropathy: Secondary | ICD-10-CM

## 2018-12-02 DIAGNOSIS — L97519 Non-pressure chronic ulcer of other part of right foot with unspecified severity: Secondary | ICD-10-CM

## 2018-12-02 DIAGNOSIS — E1022 Type 1 diabetes mellitus with diabetic chronic kidney disease: Secondary | ICD-10-CM

## 2018-12-02 DIAGNOSIS — Z6836 Body mass index (BMI) 36.0-36.9, adult: Secondary | ICD-10-CM

## 2018-12-02 DIAGNOSIS — E1043 Type 1 diabetes mellitus with diabetic autonomic (poly)neuropathy: Secondary | ICD-10-CM

## 2018-12-02 DIAGNOSIS — I129 Hypertensive chronic kidney disease with stage 1 through stage 4 chronic kidney disease, or unspecified chronic kidney disease: Secondary | ICD-10-CM

## 2018-12-02 DIAGNOSIS — Z452 Encounter for adjustment and management of vascular access device: Secondary | ICD-10-CM

## 2018-12-02 DIAGNOSIS — Z89421 Acquired absence of other right toe(s): Secondary | ICD-10-CM

## 2018-12-02 DIAGNOSIS — E10621 Type 1 diabetes mellitus with foot ulcer: Secondary | ICD-10-CM

## 2018-12-02 DIAGNOSIS — G5600 Carpal tunnel syndrome, unspecified upper limb: Secondary | ICD-10-CM

## 2018-12-02 DIAGNOSIS — K3184 Gastroparesis: Secondary | ICD-10-CM

## 2018-12-02 DIAGNOSIS — E669 Obesity, unspecified: Secondary | ICD-10-CM

## 2018-12-02 DIAGNOSIS — Z89411 Acquired absence of right great toe: Secondary | ICD-10-CM

## 2018-12-02 DIAGNOSIS — Z792 Long term (current) use of antibiotics: Secondary | ICD-10-CM

## 2018-12-02 DIAGNOSIS — D631 Anemia in chronic kidney disease: Secondary | ICD-10-CM

## 2018-12-02 DIAGNOSIS — E1051 Type 1 diabetes mellitus with diabetic peripheral angiopathy without gangrene: Secondary | ICD-10-CM

## 2018-12-02 DIAGNOSIS — Z79899 Other long term (current) drug therapy: Secondary | ICD-10-CM

## 2018-12-02 DIAGNOSIS — Z940 Kidney transplant status: Secondary | ICD-10-CM

## 2018-12-04 ENCOUNTER — Encounter: Admit: 2018-12-04 | Discharge: 2018-12-05 | Payer: MEDICAID

## 2018-12-04 DIAGNOSIS — E11610 Type 2 diabetes mellitus with diabetic neuropathic arthropathy: Secondary | ICD-10-CM

## 2018-12-04 DIAGNOSIS — Z09 Encounter for follow-up examination after completed treatment for conditions other than malignant neoplasm: Secondary | ICD-10-CM

## 2018-12-04 DIAGNOSIS — L97411 Non-pressure chronic ulcer of right heel and midfoot limited to breakdown of skin: Secondary | ICD-10-CM

## 2018-12-04 DIAGNOSIS — E11621 Type 2 diabetes mellitus with foot ulcer: Secondary | ICD-10-CM

## 2018-12-05 ENCOUNTER — Encounter
Admit: 2018-12-05 | Discharge: 2018-12-06 | Payer: MEDICAID | Attending: Infectious Disease | Primary: Infectious Disease

## 2018-12-05 DIAGNOSIS — M86471 Chronic osteomyelitis with draining sinus, right ankle and foot: Secondary | ICD-10-CM

## 2018-12-05 DIAGNOSIS — B370 Candidal stomatitis: Secondary | ICD-10-CM

## 2018-12-05 DIAGNOSIS — M86571 Other chronic hematogenous osteomyelitis, right ankle and foot: Secondary | ICD-10-CM

## 2018-12-05 MED ORDER — NYSTATIN 100,000 UNIT/ML ORAL SUSPENSION
Freq: Four times a day (QID) | ORAL | 0 refills | 14 days | Status: CP
Start: 2018-12-05 — End: 2018-12-19

## 2018-12-08 ENCOUNTER — Encounter: Admit: 2018-12-08 | Discharge: 2018-12-08 | Payer: MEDICAID | Attending: Nephrology | Primary: Nephrology

## 2018-12-08 DIAGNOSIS — E10621 Type 1 diabetes mellitus with foot ulcer: Principal | ICD-10-CM

## 2018-12-09 DIAGNOSIS — Z452 Encounter for adjustment and management of vascular access device: Principal | ICD-10-CM

## 2018-12-09 DIAGNOSIS — K3184 Gastroparesis: Principal | ICD-10-CM

## 2018-12-09 DIAGNOSIS — N183 Chronic kidney disease, stage 3 (moderate): Principal | ICD-10-CM

## 2018-12-09 DIAGNOSIS — Z89411 Acquired absence of right great toe: Principal | ICD-10-CM

## 2018-12-09 DIAGNOSIS — E10610 Type 1 diabetes mellitus with diabetic neuropathic arthropathy: Principal | ICD-10-CM

## 2018-12-09 DIAGNOSIS — L97519 Non-pressure chronic ulcer of other part of right foot with unspecified severity: Principal | ICD-10-CM

## 2018-12-09 DIAGNOSIS — D631 Anemia in chronic kidney disease: Principal | ICD-10-CM

## 2018-12-09 DIAGNOSIS — Z79899 Other long term (current) drug therapy: Principal | ICD-10-CM

## 2018-12-09 DIAGNOSIS — Z792 Long term (current) use of antibiotics: Principal | ICD-10-CM

## 2018-12-09 DIAGNOSIS — E10621 Type 1 diabetes mellitus with foot ulcer: Principal | ICD-10-CM

## 2018-12-09 DIAGNOSIS — G5600 Carpal tunnel syndrome, unspecified upper limb: Principal | ICD-10-CM

## 2018-12-09 DIAGNOSIS — Z89421 Acquired absence of other right toe(s): Principal | ICD-10-CM

## 2018-12-09 DIAGNOSIS — E1051 Type 1 diabetes mellitus with diabetic peripheral angiopathy without gangrene: Principal | ICD-10-CM

## 2018-12-09 DIAGNOSIS — E669 Obesity, unspecified: Principal | ICD-10-CM

## 2018-12-09 DIAGNOSIS — E1043 Type 1 diabetes mellitus with diabetic autonomic (poly)neuropathy: Principal | ICD-10-CM

## 2018-12-09 DIAGNOSIS — Z6836 Body mass index (BMI) 36.0-36.9, adult: Principal | ICD-10-CM

## 2018-12-09 DIAGNOSIS — Z940 Kidney transplant status: Principal | ICD-10-CM

## 2018-12-09 DIAGNOSIS — E1022 Type 1 diabetes mellitus with diabetic chronic kidney disease: Principal | ICD-10-CM

## 2018-12-09 DIAGNOSIS — I129 Hypertensive chronic kidney disease with stage 1 through stage 4 chronic kidney disease, or unspecified chronic kidney disease: Principal | ICD-10-CM

## 2018-12-09 MED FILL — TACROLIMUS 1 MG CAPSULE, IMMEDIATE-RELEASE: ORAL | 30 days supply | Qty: 180 | Fill #1

## 2018-12-09 MED FILL — TACROLIMUS 1 MG CAPSULE: 30 days supply | Qty: 180 | Fill #1 | Status: AC

## 2018-12-09 MED FILL — MYFORTIC 180 MG TABLET,DELAYED RELEASE: 30 days supply | Qty: 180 | Fill #5 | Status: AC

## 2018-12-09 MED FILL — MYFORTIC 180 MG TABLET,DELAYED RELEASE: ORAL | 30 days supply | Qty: 180 | Fill #5

## 2018-12-11 ENCOUNTER — Encounter: Admit: 2018-12-11 | Discharge: 2018-12-12 | Payer: MEDICAID

## 2018-12-11 DIAGNOSIS — E1022 Type 1 diabetes mellitus with diabetic chronic kidney disease: Principal | ICD-10-CM

## 2018-12-11 DIAGNOSIS — K3184 Gastroparesis: Principal | ICD-10-CM

## 2018-12-11 DIAGNOSIS — E11621 Type 2 diabetes mellitus with foot ulcer: Principal | ICD-10-CM

## 2018-12-11 DIAGNOSIS — Z452 Encounter for adjustment and management of vascular access device: Principal | ICD-10-CM

## 2018-12-11 DIAGNOSIS — E1051 Type 1 diabetes mellitus with diabetic peripheral angiopathy without gangrene: Principal | ICD-10-CM

## 2018-12-11 DIAGNOSIS — E10621 Type 1 diabetes mellitus with foot ulcer: Principal | ICD-10-CM

## 2018-12-11 DIAGNOSIS — L97411 Non-pressure chronic ulcer of right heel and midfoot limited to breakdown of skin: Principal | ICD-10-CM

## 2018-12-11 DIAGNOSIS — Z89411 Acquired absence of right great toe: Principal | ICD-10-CM

## 2018-12-11 DIAGNOSIS — Z89421 Acquired absence of other right toe(s): Principal | ICD-10-CM

## 2018-12-11 DIAGNOSIS — E669 Obesity, unspecified: Principal | ICD-10-CM

## 2018-12-11 DIAGNOSIS — Z6836 Body mass index (BMI) 36.0-36.9, adult: Principal | ICD-10-CM

## 2018-12-11 DIAGNOSIS — Z792 Long term (current) use of antibiotics: Principal | ICD-10-CM

## 2018-12-11 DIAGNOSIS — Z940 Kidney transplant status: Principal | ICD-10-CM

## 2018-12-11 DIAGNOSIS — Z79899 Other long term (current) drug therapy: Principal | ICD-10-CM

## 2018-12-11 DIAGNOSIS — D631 Anemia in chronic kidney disease: Principal | ICD-10-CM

## 2018-12-11 DIAGNOSIS — G5600 Carpal tunnel syndrome, unspecified upper limb: Principal | ICD-10-CM

## 2018-12-11 DIAGNOSIS — I129 Hypertensive chronic kidney disease with stage 1 through stage 4 chronic kidney disease, or unspecified chronic kidney disease: Principal | ICD-10-CM

## 2018-12-11 DIAGNOSIS — E10610 Type 1 diabetes mellitus with diabetic neuropathic arthropathy: Principal | ICD-10-CM

## 2018-12-11 DIAGNOSIS — L97519 Non-pressure chronic ulcer of other part of right foot with unspecified severity: Principal | ICD-10-CM

## 2018-12-11 DIAGNOSIS — E1043 Type 1 diabetes mellitus with diabetic autonomic (poly)neuropathy: Principal | ICD-10-CM

## 2018-12-11 DIAGNOSIS — E11610 Type 2 diabetes mellitus with diabetic neuropathic arthropathy: Principal | ICD-10-CM

## 2018-12-11 DIAGNOSIS — N183 Chronic kidney disease, stage 3 (moderate): Principal | ICD-10-CM

## 2018-12-14 ENCOUNTER — Encounter: Admit: 2018-12-14 | Discharge: 2018-12-14 | Payer: MEDICAID

## 2018-12-18 ENCOUNTER — Encounter: Admit: 2018-12-18 | Discharge: 2018-12-19 | Payer: MEDICAID

## 2018-12-24 ENCOUNTER — Ambulatory Visit: Admit: 2018-12-24 | Discharge: 2018-12-25 | Payer: MEDICAID | Attending: Family | Primary: Family

## 2018-12-24 DIAGNOSIS — E11610 Type 2 diabetes mellitus with diabetic neuropathic arthropathy: Principal | ICD-10-CM

## 2018-12-24 DIAGNOSIS — Z9889 Other specified postprocedural states: Principal | ICD-10-CM

## 2018-12-24 DIAGNOSIS — Z940 Kidney transplant status: Principal | ICD-10-CM

## 2018-12-24 DIAGNOSIS — Z8639 Personal history of other endocrine, nutritional and metabolic disease: Principal | ICD-10-CM

## 2018-12-24 MED ORDER — GABAPENTIN 100 MG CAPSULE
ORAL_CAPSULE | 0 refills | 0 days | Status: CP
Start: 2018-12-24 — End: ?

## 2018-12-29 DIAGNOSIS — Z940 Kidney transplant status: Principal | ICD-10-CM

## 2018-12-29 MED ORDER — MYFORTIC 180 MG TABLET,DELAYED RELEASE
ORAL_TABLET | ORAL | 5 refills | 0 days | Status: CP
Start: 2018-12-29 — End: 2019-12-29
  Filled 2019-01-05: qty 180, 30d supply, fill #0

## 2019-01-01 ENCOUNTER — Encounter: Admit: 2019-01-01 | Discharge: 2019-01-01 | Payer: MEDICAID

## 2019-01-01 DIAGNOSIS — L97411 Non-pressure chronic ulcer of right heel and midfoot limited to breakdown of skin: Secondary | ICD-10-CM

## 2019-01-01 DIAGNOSIS — E11621 Type 2 diabetes mellitus with foot ulcer: Secondary | ICD-10-CM

## 2019-01-01 DIAGNOSIS — E11610 Type 2 diabetes mellitus with diabetic neuropathic arthropathy: Principal | ICD-10-CM

## 2019-01-01 DIAGNOSIS — T86822 Skin graft (allograft) (autograft) infection: Principal | ICD-10-CM

## 2019-01-05 ENCOUNTER — Institutional Professional Consult (permissible substitution): Admit: 2019-01-05 | Discharge: 2019-01-06 | Payer: MEDICAID

## 2019-01-05 DIAGNOSIS — D631 Anemia in chronic kidney disease: Secondary | ICD-10-CM

## 2019-01-05 DIAGNOSIS — N183 Chronic kidney disease, stage 3 (moderate): Secondary | ICD-10-CM

## 2019-01-05 DIAGNOSIS — Z79899 Other long term (current) drug therapy: Principal | ICD-10-CM

## 2019-01-05 DIAGNOSIS — Z940 Kidney transplant status: Principal | ICD-10-CM

## 2019-01-05 DIAGNOSIS — D899 Disorder involving the immune mechanism, unspecified: Principal | ICD-10-CM

## 2019-01-05 DIAGNOSIS — Z9483 Pancreas transplant status: Principal | ICD-10-CM

## 2019-01-05 MED FILL — TACROLIMUS 1 MG CAPSULE, IMMEDIATE-RELEASE: ORAL | 30 days supply | Qty: 180 | Fill #2

## 2019-01-05 MED FILL — TACROLIMUS 1 MG CAPSULE: 30 days supply | Qty: 180 | Fill #2 | Status: AC

## 2019-01-05 MED FILL — MYFORTIC 180 MG TABLET,DELAYED RELEASE: 30 days supply | Qty: 180 | Fill #0 | Status: AC

## 2019-01-08 ENCOUNTER — Encounter: Admit: 2019-01-08 | Discharge: 2019-01-09 | Payer: MEDICAID

## 2019-01-08 MED ORDER — MYCOPHENOLATE SODIUM 180 MG TABLET,DELAYED RELEASE
ORAL_TABLET | Freq: Two times a day (BID) | ORAL | 0 refills | 30.00000 days
Start: 2019-01-08 — End: ?

## 2019-01-08 MED ORDER — TACROLIMUS 1 MG CAPSULE
ORAL_CAPSULE | Freq: Two times a day (BID) | ORAL | 0 refills | 30.00000 days
Start: 2019-01-08 — End: ?

## 2019-01-08 MED FILL — TACROLIMUS 1 MG CAPSULE: 30 days supply | Qty: 180 | Fill #0 | Status: AC

## 2019-01-08 MED FILL — MYFORTIC 180 MG TABLET,DELAYED RELEASE: ORAL | 30 days supply | Qty: 180 | Fill #0

## 2019-01-08 MED FILL — MYFORTIC 180 MG TABLET,DELAYED RELEASE: 30 days supply | Qty: 180 | Fill #0 | Status: AC

## 2019-01-08 MED FILL — TACROLIMUS 1 MG CAPSULE, IMMEDIATE-RELEASE: ORAL | 30 days supply | Qty: 180 | Fill #0

## 2019-01-12 MED ORDER — CARVEDILOL 25 MG TABLET
3 refills | 0 days | Status: CP
Start: 2019-01-12 — End: ?

## 2019-01-20 ENCOUNTER — Encounter: Admit: 2019-01-20 | Discharge: 2019-01-21 | Payer: MEDICAID

## 2019-01-27 ENCOUNTER — Encounter
Admit: 2019-01-27 | Discharge: 2019-01-27 | Payer: MEDICAID | Attending: Infectious Disease | Primary: Infectious Disease

## 2019-01-27 ENCOUNTER — Encounter: Admit: 2019-01-27 | Discharge: 2019-01-27 | Payer: MEDICAID

## 2019-01-27 MED ORDER — DOXYCYCLINE HYCLATE 100 MG TABLET
ORAL_TABLET | Freq: Two times a day (BID) | ORAL | 3 refills | 90 days | Status: CP
Start: 2019-01-27 — End: 2020-01-27

## 2019-02-03 ENCOUNTER — Ambulatory Visit: Admit: 2019-02-03 | Discharge: 2019-02-04 | Payer: MEDICAID

## 2019-02-03 DIAGNOSIS — L97411 Non-pressure chronic ulcer of right heel and midfoot limited to breakdown of skin: Secondary | ICD-10-CM

## 2019-02-03 DIAGNOSIS — E11621 Type 2 diabetes mellitus with foot ulcer: Principal | ICD-10-CM

## 2019-02-03 DIAGNOSIS — E11610 Type 2 diabetes mellitus with diabetic neuropathic arthropathy: Principal | ICD-10-CM

## 2019-02-05 DIAGNOSIS — Z940 Kidney transplant status: Principal | ICD-10-CM

## 2019-02-05 DIAGNOSIS — Z79899 Other long term (current) drug therapy: Principal | ICD-10-CM

## 2019-02-05 DIAGNOSIS — Z9483 Pancreas transplant status: Principal | ICD-10-CM

## 2019-02-06 ENCOUNTER — Encounter: Admit: 2019-02-06 | Discharge: 2019-02-06 | Payer: MEDICAID

## 2019-02-06 ENCOUNTER — Ambulatory Visit: Admit: 2019-02-06 | Discharge: 2019-02-06 | Payer: MEDICAID | Attending: Nephrology | Primary: Nephrology

## 2019-02-06 DIAGNOSIS — Z79899 Other long term (current) drug therapy: Principal | ICD-10-CM

## 2019-02-06 DIAGNOSIS — Z940 Kidney transplant status: Principal | ICD-10-CM

## 2019-02-06 DIAGNOSIS — Z9483 Pancreas transplant status: Principal | ICD-10-CM

## 2019-02-06 MED ORDER — METOCLOPRAMIDE 5 MG TABLET
ORAL_TABLET | Freq: Two times a day (BID) | ORAL | 11 refills | 30 days | Status: CP
Start: 2019-02-06 — End: ?

## 2019-02-17 ENCOUNTER — Ambulatory Visit: Admit: 2019-02-17 | Discharge: 2019-02-18 | Payer: MEDICAID

## 2019-02-17 DIAGNOSIS — E11621 Type 2 diabetes mellitus with foot ulcer: Principal | ICD-10-CM

## 2019-02-17 DIAGNOSIS — L97411 Non-pressure chronic ulcer of right heel and midfoot limited to breakdown of skin: Secondary | ICD-10-CM

## 2019-03-04 DIAGNOSIS — Z940 Kidney transplant status: Principal | ICD-10-CM

## 2019-03-04 MED ORDER — AMLODIPINE 5 MG TABLET
3 refills | 0 days | Status: CP
Start: 2019-03-04 — End: ?

## 2019-03-04 MED ORDER — MYCOPHENOLATE SODIUM 180 MG TABLET,DELAYED RELEASE
ORAL_TABLET | Freq: Two times a day (BID) | ORAL | 0 refills | 30.00000 days | Status: CP
Start: 2019-03-04 — End: ?
  Filled 2019-03-10: qty 180, 30d supply, fill #0

## 2019-03-04 MED ORDER — OMEPRAZOLE 40 MG CAPSULE,DELAYED RELEASE
11 refills | 0 days | Status: CP
Start: 2019-03-04 — End: ?

## 2019-03-04 MED ORDER — TACROLIMUS 1 MG CAPSULE
ORAL_CAPSULE | Freq: Two times a day (BID) | ORAL | 0 refills | 30.00000 days | Status: CP
Start: 2019-03-04 — End: ?
  Filled 2019-03-10: qty 180, 30d supply, fill #0

## 2019-03-05 DIAGNOSIS — Z9483 Pancreas transplant status: Principal | ICD-10-CM

## 2019-03-05 DIAGNOSIS — Z940 Kidney transplant status: Principal | ICD-10-CM

## 2019-03-05 DIAGNOSIS — Z79899 Other long term (current) drug therapy: Principal | ICD-10-CM

## 2019-03-05 DIAGNOSIS — K222 Esophageal obstruction: Principal | ICD-10-CM

## 2019-03-10 ENCOUNTER — Encounter: Admit: 2019-03-10 | Discharge: 2019-03-11 | Payer: MEDICAID

## 2019-03-10 DIAGNOSIS — L97411 Non-pressure chronic ulcer of right heel and midfoot limited to breakdown of skin: Secondary | ICD-10-CM

## 2019-03-10 DIAGNOSIS — E11621 Type 2 diabetes mellitus with foot ulcer: Principal | ICD-10-CM

## 2019-03-10 DIAGNOSIS — L97413 Non-pressure chronic ulcer of right heel and midfoot with necrosis of muscle: Principal | ICD-10-CM

## 2019-03-10 DIAGNOSIS — E11610 Type 2 diabetes mellitus with diabetic neuropathic arthropathy: Principal | ICD-10-CM

## 2019-03-10 MED FILL — TACROLIMUS 1 MG CAPSULE: 30 days supply | Qty: 180 | Fill #0 | Status: AC

## 2019-03-10 MED FILL — MYFORTIC 180 MG TABLET,DELAYED RELEASE: 30 days supply | Qty: 180 | Fill #0 | Status: AC

## 2019-03-11 ENCOUNTER — Encounter: Admit: 2019-03-11 | Discharge: 2019-03-11 | Payer: MEDICAID

## 2019-03-11 ENCOUNTER — Encounter: Admit: 2019-03-11 | Discharge: 2019-03-11 | Payer: MEDICAID | Attending: Family Medicine | Primary: Family Medicine

## 2019-03-11 DIAGNOSIS — G6289 Other specified polyneuropathies: Principal | ICD-10-CM

## 2019-03-11 DIAGNOSIS — D899 Disorder involving the immune mechanism, unspecified: Principal | ICD-10-CM

## 2019-03-11 DIAGNOSIS — N183 Chronic kidney disease, stage 3 (moderate): Secondary | ICD-10-CM

## 2019-03-11 DIAGNOSIS — Z940 Kidney transplant status: Principal | ICD-10-CM

## 2019-03-11 DIAGNOSIS — Z79899 Other long term (current) drug therapy: Principal | ICD-10-CM

## 2019-03-11 DIAGNOSIS — D631 Anemia in chronic kidney disease: Principal | ICD-10-CM

## 2019-03-11 DIAGNOSIS — Z1231 Encounter for screening mammogram for malignant neoplasm of breast: Principal | ICD-10-CM

## 2019-03-11 DIAGNOSIS — Z9483 Pancreas transplant status: Principal | ICD-10-CM

## 2019-03-17 ENCOUNTER — Encounter: Admit: 2019-03-17 | Discharge: 2019-03-18 | Payer: MEDICAID

## 2019-03-19 ENCOUNTER — Encounter: Admit: 2019-03-19 | Discharge: 2019-03-20 | Payer: MEDICAID

## 2019-03-19 DIAGNOSIS — Z9181 History of falling: Principal | ICD-10-CM

## 2019-03-19 DIAGNOSIS — E1042 Type 1 diabetes mellitus with diabetic polyneuropathy: Principal | ICD-10-CM

## 2019-03-19 DIAGNOSIS — Z9483 Pancreas transplant status: Principal | ICD-10-CM

## 2019-03-19 DIAGNOSIS — M86679 Other chronic osteomyelitis, unspecified ankle and foot: Principal | ICD-10-CM

## 2019-03-19 DIAGNOSIS — I739 Peripheral vascular disease, unspecified: Principal | ICD-10-CM

## 2019-03-19 DIAGNOSIS — Z89429 Acquired absence of other toe(s), unspecified side: Principal | ICD-10-CM

## 2019-03-19 DIAGNOSIS — D899 Disorder involving the immune mechanism, unspecified: Principal | ICD-10-CM

## 2019-03-19 DIAGNOSIS — R0609 Other forms of dyspnea: Principal | ICD-10-CM

## 2019-03-19 DIAGNOSIS — R87612 Low grade squamous intraepithelial lesion on cytologic smear of cervix (LGSIL): Principal | ICD-10-CM

## 2019-03-19 DIAGNOSIS — Z8639 Personal history of other endocrine, nutritional and metabolic disease: Principal | ICD-10-CM

## 2019-03-19 DIAGNOSIS — G44209 Tension-type headache, unspecified, not intractable: Principal | ICD-10-CM

## 2019-03-19 DIAGNOSIS — G6289 Other specified polyneuropathies: Principal | ICD-10-CM

## 2019-03-19 DIAGNOSIS — E11610 Type 2 diabetes mellitus with diabetic neuropathic arthropathy: Principal | ICD-10-CM

## 2019-03-19 MED ORDER — ALBUTEROL SULFATE HFA 90 MCG/ACTUATION AEROSOL INHALER
RESPIRATORY_TRACT | 2 refills | 0 days | Status: CP | PRN
Start: 2019-03-19 — End: 2020-03-18

## 2019-03-23 ENCOUNTER — Telehealth
Admit: 2019-03-23 | Discharge: 2019-03-24 | Payer: MEDICAID | Attending: Student in an Organized Health Care Education/Training Program | Primary: Student in an Organized Health Care Education/Training Program

## 2019-03-23 DIAGNOSIS — K222 Esophageal obstruction: Principal | ICD-10-CM

## 2019-03-23 DIAGNOSIS — Z9483 Pancreas transplant status: Principal | ICD-10-CM

## 2019-03-23 DIAGNOSIS — R1310 Dysphagia, unspecified: Principal | ICD-10-CM

## 2019-03-23 DIAGNOSIS — Z79899 Other long term (current) drug therapy: Principal | ICD-10-CM

## 2019-03-23 DIAGNOSIS — Z940 Kidney transplant status: Principal | ICD-10-CM

## 2019-03-25 MED ORDER — ATORVASTATIN 40 MG TABLET
ORAL_TABLET | Freq: Every day | ORAL | 3 refills | 90 days | Status: CP
Start: 2019-03-25 — End: ?

## 2019-03-29 DIAGNOSIS — Z940 Kidney transplant status: Principal | ICD-10-CM

## 2019-03-30 ENCOUNTER — Ambulatory Visit: Admit: 2019-03-30 | Discharge: 2019-03-31 | Payer: MEDICAID | Attending: Family | Primary: Family

## 2019-04-01 ENCOUNTER — Encounter: Admit: 2019-04-01 | Discharge: 2019-04-01 | Payer: MEDICAID

## 2019-04-01 ENCOUNTER — Encounter: Admit: 2019-04-01 | Discharge: 2019-04-01 | Payer: MEDICAID | Attending: Anesthesiology | Primary: Anesthesiology

## 2019-04-01 DIAGNOSIS — Z940 Kidney transplant status: Principal | ICD-10-CM

## 2019-04-01 MED ORDER — MYCOPHENOLATE SODIUM 180 MG TABLET,DELAYED RELEASE
ORAL_TABLET | Freq: Two times a day (BID) | ORAL | 0 refills | 30.00000 days | Status: CP
Start: 2019-04-01 — End: ?
  Filled 2019-04-06: qty 180, 30d supply, fill #0

## 2019-04-01 MED ORDER — TACROLIMUS 1 MG CAPSULE
ORAL_CAPSULE | Freq: Two times a day (BID) | ORAL | 0 refills | 30 days | Status: CP
Start: 2019-04-01 — End: ?
  Filled 2019-04-06: qty 180, 30d supply, fill #0

## 2019-04-06 MED FILL — TACROLIMUS 1 MG CAPSULE: 30 days supply | Qty: 180 | Fill #0 | Status: AC

## 2019-04-06 MED FILL — MYFORTIC 180 MG TABLET,DELAYED RELEASE: 30 days supply | Qty: 180 | Fill #0 | Status: AC

## 2019-04-07 ENCOUNTER — Encounter: Admit: 2019-04-07 | Discharge: 2019-04-08 | Payer: MEDICAID

## 2019-04-07 DIAGNOSIS — E11621 Type 2 diabetes mellitus with foot ulcer: Principal | ICD-10-CM

## 2019-04-07 DIAGNOSIS — L97411 Non-pressure chronic ulcer of right heel and midfoot limited to breakdown of skin: Secondary | ICD-10-CM

## 2019-04-08 ENCOUNTER — Encounter: Admit: 2019-04-08 | Discharge: 2019-04-09 | Payer: MEDICAID

## 2019-04-08 DIAGNOSIS — N183 Chronic kidney disease, stage 3 (moderate): Secondary | ICD-10-CM

## 2019-04-08 DIAGNOSIS — Z940 Kidney transplant status: Principal | ICD-10-CM

## 2019-04-08 DIAGNOSIS — Z9483 Pancreas transplant status: Principal | ICD-10-CM

## 2019-04-08 DIAGNOSIS — D899 Disorder involving the immune mechanism, unspecified: Principal | ICD-10-CM

## 2019-04-08 DIAGNOSIS — D631 Anemia in chronic kidney disease: Secondary | ICD-10-CM

## 2019-04-08 DIAGNOSIS — Z79899 Other long term (current) drug therapy: Principal | ICD-10-CM

## 2019-04-21 MED ORDER — GABAPENTIN 100 MG CAPSULE
ORAL_CAPSULE | 5 refills | 0 days | Status: CP
Start: 2019-04-21 — End: ?

## 2019-04-28 DIAGNOSIS — Z940 Kidney transplant status: Principal | ICD-10-CM

## 2019-04-29 MED ORDER — TACROLIMUS 1 MG CAPSULE
ORAL_CAPSULE | Freq: Two times a day (BID) | ORAL | 11 refills | 30 days | Status: CP
Start: 2019-04-29 — End: ?
  Filled 2019-05-06: qty 180, 30d supply, fill #0

## 2019-04-29 MED ORDER — MYCOPHENOLATE SODIUM 180 MG TABLET,DELAYED RELEASE
ORAL_TABLET | Freq: Two times a day (BID) | ORAL | 11 refills | 30 days | Status: CP
Start: 2019-04-29 — End: ?
  Filled 2019-05-06: qty 180, 30d supply, fill #0

## 2019-05-06 MED FILL — TACROLIMUS 1 MG CAPSULE: 30 days supply | Qty: 180 | Fill #0 | Status: AC

## 2019-05-06 MED FILL — MYFORTIC 180 MG TABLET,DELAYED RELEASE: 30 days supply | Qty: 180 | Fill #0 | Status: AC

## 2019-05-07 ENCOUNTER — Ambulatory Visit: Admit: 2019-05-07 | Discharge: 2019-05-08 | Payer: MEDICAID

## 2019-05-07 DIAGNOSIS — M25512 Pain in left shoulder: Principal | ICD-10-CM

## 2019-05-07 DIAGNOSIS — N183 Chronic kidney disease, stage 3 (moderate): Principal | ICD-10-CM

## 2019-05-07 DIAGNOSIS — M7542 Impingement syndrome of left shoulder: Principal | ICD-10-CM

## 2019-05-12 ENCOUNTER — Encounter: Admit: 2019-05-12 | Discharge: 2019-05-13 | Payer: MEDICAID

## 2019-05-12 DIAGNOSIS — Z9483 Pancreas transplant status: Principal | ICD-10-CM

## 2019-05-12 DIAGNOSIS — N183 Chronic kidney disease, stage 3 (moderate): Secondary | ICD-10-CM

## 2019-05-12 DIAGNOSIS — Z79899 Other long term (current) drug therapy: Principal | ICD-10-CM

## 2019-05-12 DIAGNOSIS — D631 Anemia in chronic kidney disease: Secondary | ICD-10-CM

## 2019-05-12 DIAGNOSIS — Z940 Kidney transplant status: Principal | ICD-10-CM

## 2019-06-01 ENCOUNTER — Encounter: Admit: 2019-06-01 | Discharge: 2019-06-02 | Payer: MEDICAID | Attending: Family Medicine | Primary: Family Medicine

## 2019-06-01 DIAGNOSIS — M546 Pain in thoracic spine: Principal | ICD-10-CM

## 2019-06-01 MED ORDER — CYCLOBENZAPRINE 5 MG TABLET
ORAL_TABLET | Freq: Three times a day (TID) | ORAL | 0 refills | 10.00000 days | Status: CP | PRN
Start: 2019-06-01 — End: ?

## 2019-06-03 ENCOUNTER — Encounter: Admit: 2019-06-03 | Discharge: 2019-06-04 | Payer: MEDICAID

## 2019-06-03 DIAGNOSIS — N183 Chronic kidney disease, stage 3 (moderate): Secondary | ICD-10-CM

## 2019-06-03 DIAGNOSIS — D899 Disorder involving the immune mechanism, unspecified: Principal | ICD-10-CM

## 2019-06-03 DIAGNOSIS — Z9483 Pancreas transplant status: Principal | ICD-10-CM

## 2019-06-03 DIAGNOSIS — D631 Anemia in chronic kidney disease: Principal | ICD-10-CM

## 2019-06-03 DIAGNOSIS — Z940 Kidney transplant status: Principal | ICD-10-CM

## 2019-06-03 DIAGNOSIS — Z79899 Other long term (current) drug therapy: Principal | ICD-10-CM

## 2019-06-03 MED FILL — MYFORTIC 180 MG TABLET,DELAYED RELEASE: 30 days supply | Qty: 180 | Fill #1 | Status: AC

## 2019-06-03 MED FILL — MYFORTIC 180 MG TABLET,DELAYED RELEASE: ORAL | 30 days supply | Qty: 180 | Fill #1

## 2019-06-03 MED FILL — TACROLIMUS 1 MG CAPSULE, IMMEDIATE-RELEASE: 30 days supply | Qty: 180 | Fill #1 | Status: AC

## 2019-06-03 MED FILL — TACROLIMUS 1 MG CAPSULE, IMMEDIATE-RELEASE: ORAL | 30 days supply | Qty: 180 | Fill #1

## 2019-06-05 ENCOUNTER — Encounter
Admit: 2019-06-05 | Discharge: 2019-06-06 | Payer: MEDICAID | Attending: Infectious Disease | Primary: Infectious Disease

## 2019-06-05 DIAGNOSIS — M7989 Other specified soft tissue disorders: Principal | ICD-10-CM

## 2019-06-05 DIAGNOSIS — Z940 Kidney transplant status: Principal | ICD-10-CM

## 2019-06-05 DIAGNOSIS — Z9483 Pancreas transplant status: Principal | ICD-10-CM

## 2019-06-05 MED ORDER — DOXYCYCLINE HYCLATE 100 MG TABLET
ORAL_TABLET | Freq: Two times a day (BID) | ORAL | 3 refills | 90 days | Status: CP
Start: 2019-06-05 — End: 2020-06-04

## 2019-06-09 ENCOUNTER — Ambulatory Visit: Admit: 2019-06-09 | Discharge: 2019-06-10 | Payer: MEDICAID

## 2019-06-09 DIAGNOSIS — Z940 Kidney transplant status: Principal | ICD-10-CM

## 2019-06-09 DIAGNOSIS — Z9483 Pancreas transplant status: Principal | ICD-10-CM

## 2019-06-09 DIAGNOSIS — Z79899 Other long term (current) drug therapy: Principal | ICD-10-CM

## 2019-06-10 ENCOUNTER — Encounter: Admit: 2019-06-10 | Discharge: 2019-06-11 | Payer: MEDICAID | Attending: Nephrology | Primary: Nephrology

## 2019-06-10 DIAGNOSIS — D899 Disorder involving the immune mechanism, unspecified: Principal | ICD-10-CM

## 2019-06-10 DIAGNOSIS — Z9483 Pancreas transplant status: Principal | ICD-10-CM

## 2019-06-10 DIAGNOSIS — Z79899 Other long term (current) drug therapy: Principal | ICD-10-CM

## 2019-06-10 DIAGNOSIS — I890 Lymphedema, not elsewhere classified: Principal | ICD-10-CM

## 2019-06-10 DIAGNOSIS — Z940 Kidney transplant status: Principal | ICD-10-CM

## 2019-06-10 MED ORDER — FUROSEMIDE 20 MG TABLET
ORAL_TABLET | Freq: Two times a day (BID) | ORAL | 11 refills | 30.00000 days | Status: CP
Start: 2019-06-10 — End: ?

## 2019-06-15 MED ORDER — SODIUM BICARBONATE 650 MG TABLET
ORAL_TABLET | 0 refills | 0 days | Status: CP
Start: 2019-06-15 — End: ?

## 2019-06-17 ENCOUNTER — Encounter: Admit: 2019-06-17 | Discharge: 2019-06-18 | Payer: MEDICAID | Attending: Registered" | Primary: Registered"

## 2019-06-17 DIAGNOSIS — Z9483 Pancreas transplant status: Principal | ICD-10-CM

## 2019-06-17 DIAGNOSIS — D631 Anemia in chronic kidney disease: Principal | ICD-10-CM

## 2019-06-17 DIAGNOSIS — N183 Anemia in stage 3 chronic kidney disease, unspecified whether stage 3a or 3b CKD: Principal | ICD-10-CM

## 2019-06-17 DIAGNOSIS — Z940 Kidney transplant status: Principal | ICD-10-CM

## 2019-06-17 DIAGNOSIS — N1831 Stage 3a chronic kidney disease: Principal | ICD-10-CM

## 2019-06-17 DIAGNOSIS — Z79899 Other long term (current) drug therapy: Principal | ICD-10-CM

## 2019-06-22 ENCOUNTER — Telehealth
Admit: 2019-06-22 | Discharge: 2019-06-23 | Payer: MEDICAID | Attending: Student in an Organized Health Care Education/Training Program | Primary: Student in an Organized Health Care Education/Training Program

## 2019-07-01 ENCOUNTER — Encounter: Admit: 2019-07-01 | Discharge: 2019-07-01 | Payer: MEDICAID

## 2019-07-01 ENCOUNTER — Telehealth
Admit: 2019-07-01 | Discharge: 2019-07-01 | Payer: MEDICAID | Attending: Student in an Organized Health Care Education/Training Program | Primary: Student in an Organized Health Care Education/Training Program

## 2019-07-01 DIAGNOSIS — N183 Anemia in stage 3 chronic kidney disease, unspecified whether stage 3a or 3b CKD: Secondary | ICD-10-CM

## 2019-07-01 DIAGNOSIS — Z940 Kidney transplant status: Principal | ICD-10-CM

## 2019-07-01 DIAGNOSIS — D631 Anemia in chronic kidney disease: Secondary | ICD-10-CM

## 2019-07-01 DIAGNOSIS — Z9483 Pancreas transplant status: Principal | ICD-10-CM

## 2019-07-01 DIAGNOSIS — Z79899 Other long term (current) drug therapy: Principal | ICD-10-CM

## 2019-07-02 MED ORDER — CLONAZEPAM 0.5 MG TABLET
ORAL_TABLET | Freq: Every evening | ORAL | 3 refills | 30 days | Status: CP
Start: 2019-07-02 — End: 2019-10-30

## 2019-07-13 ENCOUNTER — Ambulatory Visit
Admit: 2019-07-13 | Discharge: 2019-08-11 | Payer: MEDICAID | Attending: Rehabilitative and Restorative Service Providers" | Primary: Rehabilitative and Restorative Service Providers"

## 2019-07-22 MED ORDER — MEDICAL SUPPLY ITEM
1 refills | 0 days
Start: 2019-07-22 — End: ?

## 2019-07-23 ENCOUNTER — Encounter: Admit: 2019-07-23 | Discharge: 2019-07-24 | Payer: MEDICAID

## 2019-07-23 DIAGNOSIS — R87612 Low grade squamous intraepithelial lesion on cytologic smear of cervix (LGSIL): Principal | ICD-10-CM

## 2019-07-23 DIAGNOSIS — B029 Zoster without complications: Principal | ICD-10-CM

## 2019-07-23 MED ORDER — VALACYCLOVIR 1 GRAM TABLET
ORAL_TABLET | Freq: Three times a day (TID) | ORAL | 0 refills | 7.00000 days | Status: CP
Start: 2019-07-23 — End: 2019-07-30

## 2019-07-29 ENCOUNTER — Encounter: Admit: 2019-07-29 | Discharge: 2019-07-29 | Payer: MEDICAID

## 2019-07-29 DIAGNOSIS — Z79899 Other long term (current) drug therapy: Principal | ICD-10-CM

## 2019-07-29 DIAGNOSIS — Z940 Kidney transplant status: Principal | ICD-10-CM

## 2019-07-29 DIAGNOSIS — N183 Anemia in stage 3 chronic kidney disease, unspecified whether stage 3a or 3b CKD: Secondary | ICD-10-CM

## 2019-07-29 DIAGNOSIS — Z9483 Pancreas transplant status: Principal | ICD-10-CM

## 2019-07-29 DIAGNOSIS — D631 Anemia in chronic kidney disease: Secondary | ICD-10-CM

## 2019-07-29 MED FILL — MYFORTIC 180 MG TABLET,DELAYED RELEASE: ORAL | 30 days supply | Qty: 180 | Fill #2

## 2019-07-29 MED FILL — TACROLIMUS 1 MG CAPSULE, IMMEDIATE-RELEASE: 30 days supply | Qty: 180 | Fill #2 | Status: AC

## 2019-07-29 MED FILL — MYFORTIC 180 MG TABLET,DELAYED RELEASE: 30 days supply | Qty: 180 | Fill #2 | Status: AC

## 2019-07-29 MED FILL — TACROLIMUS 1 MG CAPSULE, IMMEDIATE-RELEASE: ORAL | 30 days supply | Qty: 180 | Fill #2

## 2019-08-03 ENCOUNTER — Ambulatory Visit: Admit: 2019-08-03 | Discharge: 2019-08-04 | Payer: MEDICAID

## 2019-08-03 DIAGNOSIS — S80822D Blister (nonthermal), left lower leg, subsequent encounter: Principal | ICD-10-CM

## 2019-08-03 MED ORDER — TRIAMCINOLONE ACETONIDE 0.1 % TOPICAL CREAM
Freq: Two times a day (BID) | TOPICAL | 0 refills | 30.00000 days | Status: CP
Start: 2019-08-03 — End: 2019-09-02

## 2019-08-03 MED ORDER — SULFAMETHOXAZOLE 800 MG-TRIMETHOPRIM 160 MG TABLET
ORAL_TABLET | Freq: Two times a day (BID) | ORAL | 0 refills | 7 days | Status: CP
Start: 2019-08-03 — End: 2019-08-10

## 2019-08-04 ENCOUNTER — Encounter: Admit: 2019-08-04 | Discharge: 2019-08-04 | Payer: MEDICAID | Attending: Family Medicine | Primary: Family Medicine

## 2019-08-04 ENCOUNTER — Ambulatory Visit: Admit: 2019-08-04 | Discharge: 2019-08-04 | Payer: MEDICAID

## 2019-08-04 ENCOUNTER — Encounter: Admit: 2019-08-04 | Discharge: 2019-08-04 | Payer: MEDICAID | Attending: Registered" | Primary: Registered"

## 2019-08-04 DIAGNOSIS — I10 Essential (primary) hypertension: Principal | ICD-10-CM

## 2019-08-04 DIAGNOSIS — E782 Mixed hyperlipidemia: Principal | ICD-10-CM

## 2019-08-04 DIAGNOSIS — E669 Obesity, unspecified: Principal | ICD-10-CM

## 2019-08-04 MED ORDER — TOPIRAMATE 25 MG TABLET
ORAL_TABLET | Freq: Every evening | ORAL | 1 refills | 60.00000 days | Status: CP
Start: 2019-08-04 — End: ?

## 2019-08-07 MED ORDER — PREDNISONE 2.5 MG TABLET
ORAL_TABLET | Freq: Every day | ORAL | 3 refills | 0 days
Start: 2019-08-07 — End: ?

## 2019-08-18 ENCOUNTER — Ambulatory Visit: Admit: 2019-08-18 | Discharge: 2019-08-19 | Payer: MEDICAID

## 2019-08-18 DIAGNOSIS — L97929 Non-pressure chronic ulcer of unspecified part of left lower leg with unspecified severity: Secondary | ICD-10-CM

## 2019-08-18 DIAGNOSIS — I87312 Chronic venous hypertension (idiopathic) with ulcer of left lower extremity: Principal | ICD-10-CM

## 2019-08-18 MED ORDER — FUROSEMIDE 20 MG TABLET
ORAL_TABLET | Freq: Two times a day (BID) | ORAL | 1 refills | 30.00000 days | Status: CP
Start: 2019-08-18 — End: 2019-10-17

## 2019-08-18 MED ORDER — MUPIROCIN 2 % TOPICAL OINTMENT
5 refills | 0 days | Status: CP
Start: 2019-08-18 — End: ?

## 2019-08-19 ENCOUNTER — Encounter
Admit: 2019-08-19 | Discharge: 2019-09-10 | Payer: MEDICAID | Attending: Rehabilitative and Restorative Service Providers" | Primary: Rehabilitative and Restorative Service Providers"

## 2019-08-19 ENCOUNTER — Ambulatory Visit
Admit: 2019-08-19 | Discharge: 2019-09-10 | Payer: MEDICAID | Attending: Rehabilitative and Restorative Service Providers" | Primary: Rehabilitative and Restorative Service Providers"

## 2019-08-26 MED ORDER — COMPRESSION STOCKING, THIGH HIGH, LONG LENGTH, LARGE CIRCUMFERENCE
Freq: Every day | 2 refills | 0.00000 days | Status: CP
Start: 2019-08-26 — End: ?

## 2019-08-26 MED ORDER — COMPRESSION STOCKING, THIGH HIGH, LONG LENGTH, LARGE CIRCUMFERENCE: 1 | each | Freq: Every day | 2 refills | 0 days | Status: AC

## 2019-08-26 MED FILL — TACROLIMUS 1 MG CAPSULE, IMMEDIATE-RELEASE: ORAL | 30 days supply | Qty: 180 | Fill #3

## 2019-08-26 MED FILL — MYFORTIC 180 MG TABLET,DELAYED RELEASE: 30 days supply | Qty: 180 | Fill #3 | Status: AC

## 2019-08-26 MED FILL — TACROLIMUS 1 MG CAPSULE, IMMEDIATE-RELEASE: 30 days supply | Qty: 180 | Fill #3 | Status: AC

## 2019-08-26 MED FILL — MYFORTIC 180 MG TABLET,DELAYED RELEASE: ORAL | 30 days supply | Qty: 180 | Fill #3

## 2019-08-27 ENCOUNTER — Ambulatory Visit
Admit: 2019-08-27 | Discharge: 2019-08-28 | Payer: MEDICAID | Attending: Obstetrics & Gynecology | Primary: Obstetrics & Gynecology

## 2019-08-27 DIAGNOSIS — N870 Mild cervical dysplasia: Principal | ICD-10-CM

## 2019-08-31 MED ORDER — SODIUM BICARBONATE 650 MG TABLET
ORAL_TABLET | 0 refills | 0.00000 days
Start: 2019-08-31 — End: ?

## 2019-09-09 IMAGING — CR DG CHEST 2V
1 series · 2 of 2 positions shown · non-contrast
Comparison: 08/25/2016

CLINICAL DATA: Central chest pain radiating to the back. Foot
osteomyelitis

EXAM:
CHEST - 2 VIEW

[Series 1: dg chest 2 view · 0.14mm/px · 2 of 2 slices shown]
[im 1/2]
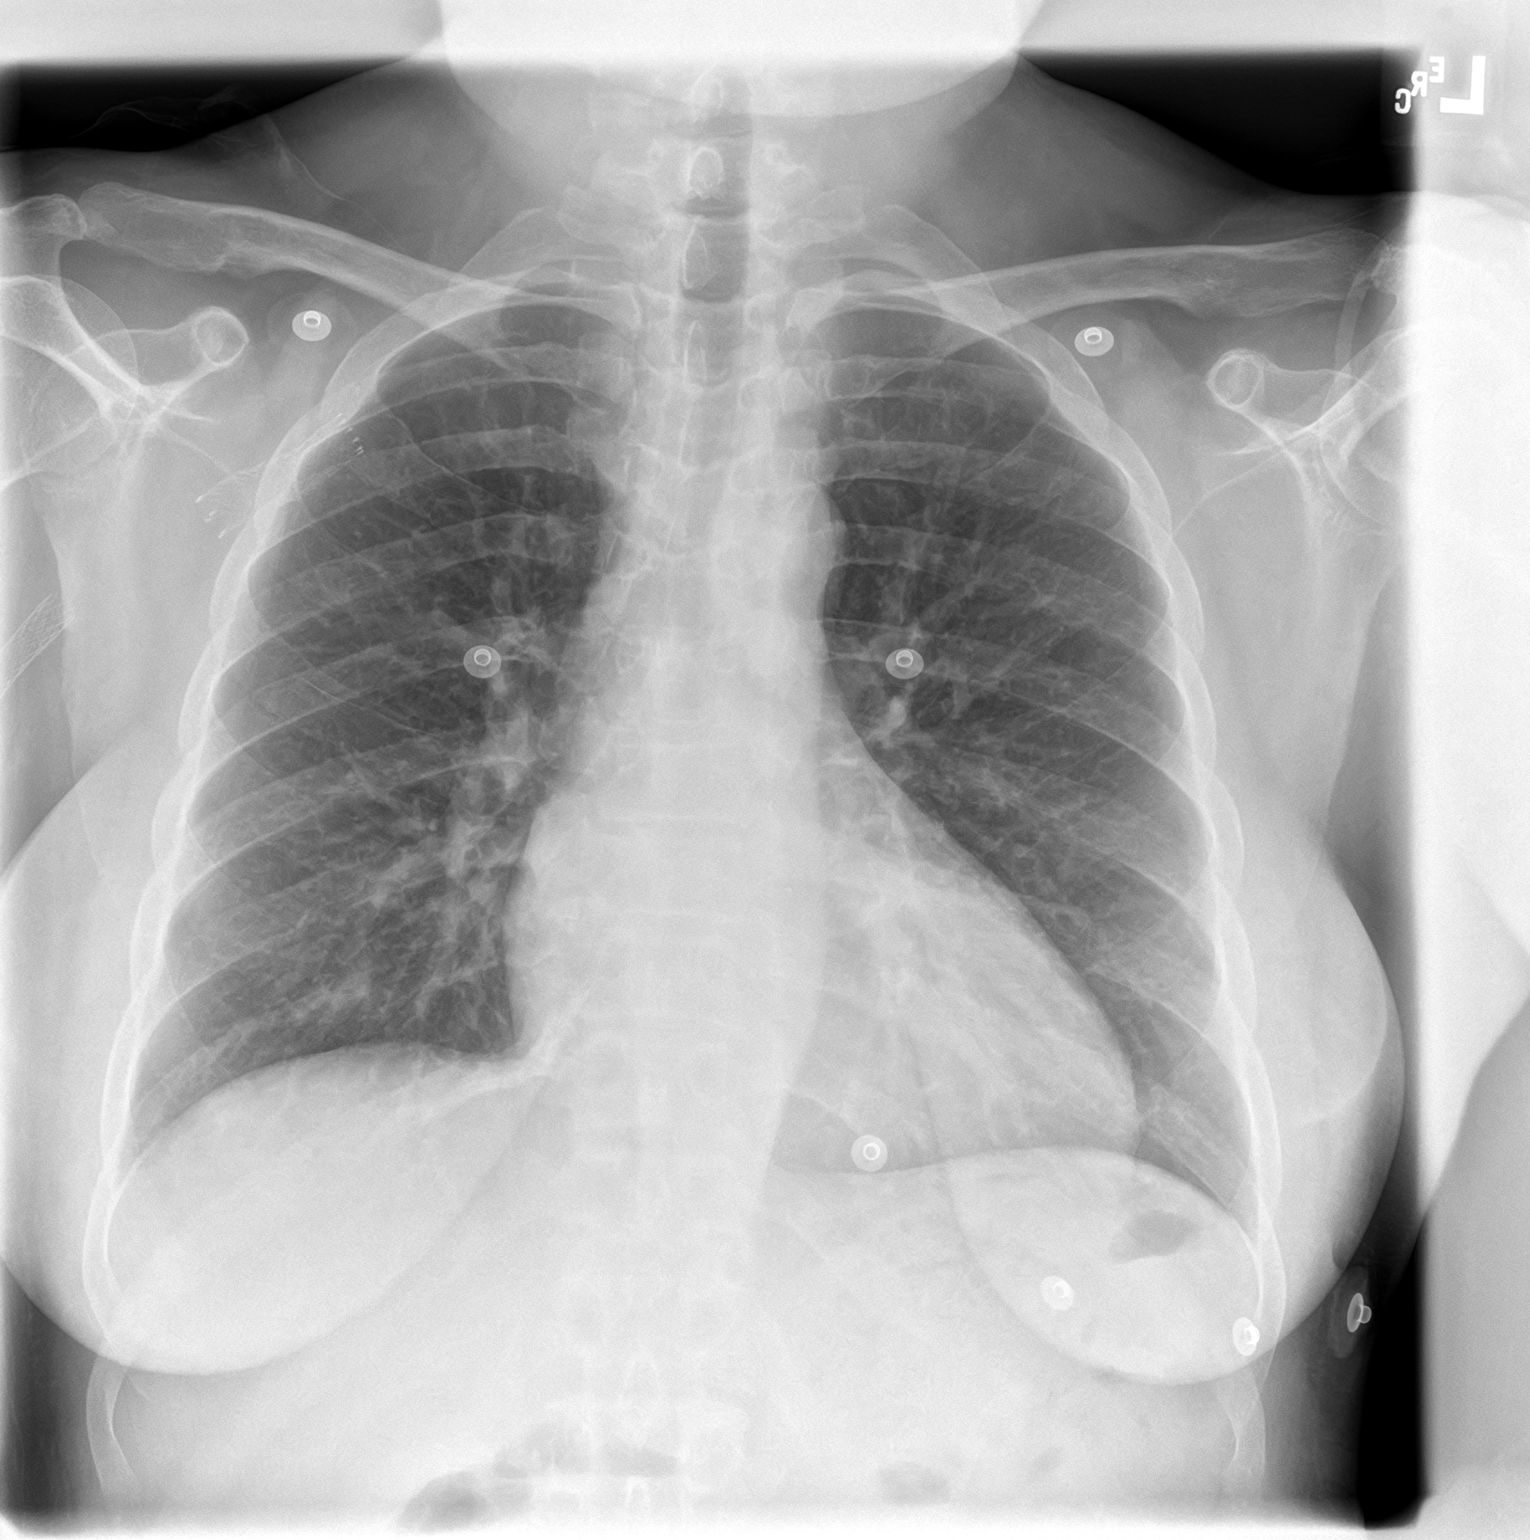
[im 2/2]
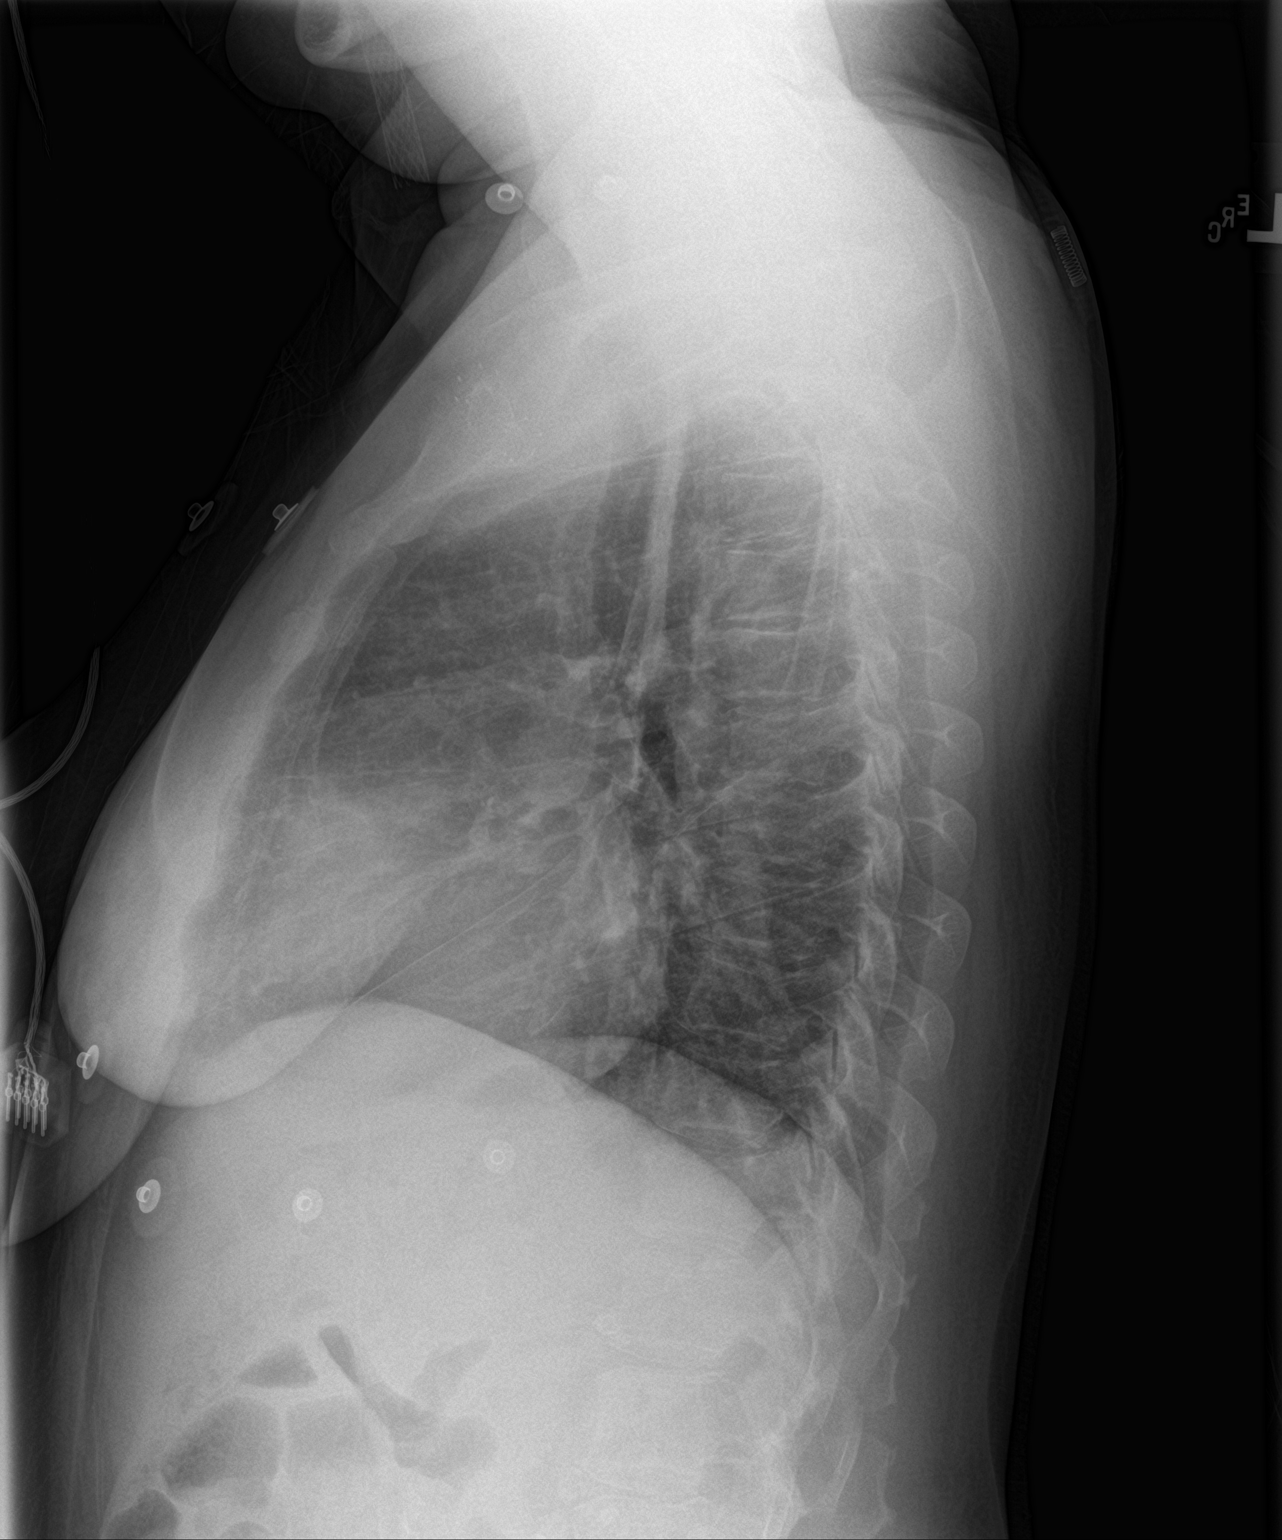

[2 of 2 positions shown; findings below may reference images not displayed]

FINDINGS: Chronic cardiomegaly. Mild interstitial coarsening. There is no
edema, consolidation, effusion, or pneumothorax. Venous stenting in
the right axilla and upper arm. No acute osseous finding.
IMPRESSION: No evidence of active disease.

## 2019-09-10 IMAGING — NM NM PULMONARY VENT & PERF
2 series · 16 of 16 positions shown · non-contrast
Comparison: Chest radiograph yesterday.

CLINICAL DATA: Chest pain.  PE suspected, high pretest prob

EXAM:
NUCLEAR MEDICINE VENTILATION - PERFUSION LUNG SCAN
TECHNIQUE: Ventilation images were obtained in multiple projections using
inhaled aerosol Fc-44m DTPA. Perfusion images were obtained in
multiple projections after intravenous injection of [id number].
RADIOPHARMACEUTICALS:  32.523 mCi of Fc-44m DTPA aerosol inhalation
and 4.243 mCi [id number] IV

[Series 1000: lung ventilation · 3.90mm/px · 4 acquisitions, 8 frames shown]
[im 1/4]
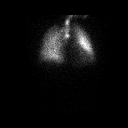
[im 1/4]
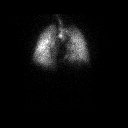
[im 2/4]
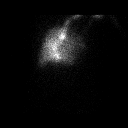
[im 2/4]
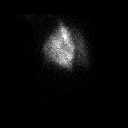
[im 3/4]
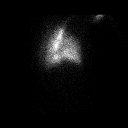
[im 3/4]
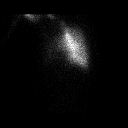
[im 4/4]
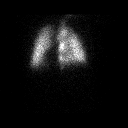
[im 4/4]
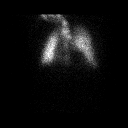

[Series 1000: lung perfusion · 1.95mm/px · 4 acquisitions, 8 frames shown]
[im 1/4]
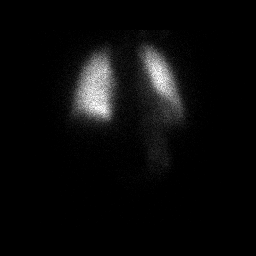
[im 1/4]
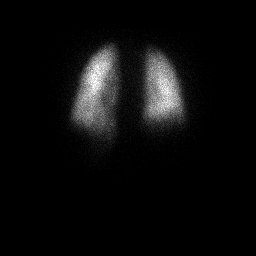
[im 2/4]
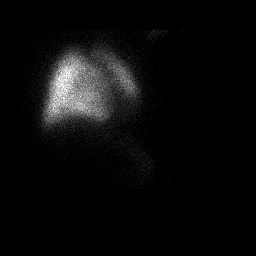
[im 2/4]
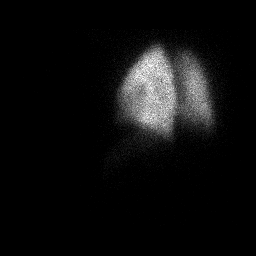
[im 3/4]
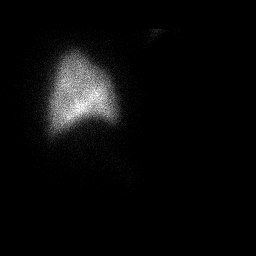
[im 3/4]
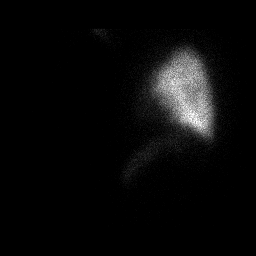
[im 4/4]
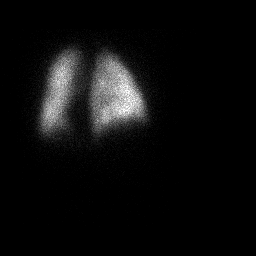
[im 4/4]
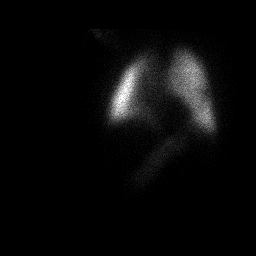

[16 of 16 positions shown; findings below may reference images not displayed]

FINDINGS: Ventilation: Heterogeneous ventilation in the right lung,
ventilation more heterogeneous than perfusion. Mild central
deposition of radiotracer in the trachea.

Perfusion: Single small peripheral defect in the left lower lobe
laterally, indicating low probability of pulmonary embolus by PIOPED
II. Homogeneous perfusion to the right lung.
IMPRESSION: Low probability for pulmonary embolus. Ventilation more
heterogeneous than perfusion imaging, which can be seen with small
airways disease.

## 2019-09-17 ENCOUNTER — Encounter
Admit: 2019-09-17 | Discharge: 2019-10-10 | Payer: MEDICAID | Attending: Rehabilitative and Restorative Service Providers" | Primary: Rehabilitative and Restorative Service Providers"

## 2019-09-17 ENCOUNTER — Encounter: Admit: 2019-09-17 | Discharge: 2019-10-10 | Payer: MEDICAID | Attending: Podiatrist | Primary: Podiatrist

## 2019-09-17 ENCOUNTER — Ambulatory Visit
Admit: 2019-09-17 | Discharge: 2019-10-10 | Payer: MEDICAID | Attending: Rehabilitative and Restorative Service Providers" | Primary: Rehabilitative and Restorative Service Providers"

## 2019-09-21 ENCOUNTER — Encounter: Admit: 2019-09-21 | Discharge: 2019-09-21 | Payer: MEDICAID

## 2019-09-21 DIAGNOSIS — D631 Anemia in chronic kidney disease: Secondary | ICD-10-CM

## 2019-09-21 DIAGNOSIS — D849 Immunodeficiency, unspecified: Principal | ICD-10-CM

## 2019-09-21 DIAGNOSIS — Z9483 Pancreas transplant status: Principal | ICD-10-CM

## 2019-09-21 DIAGNOSIS — N183 Anemia in stage 3 chronic kidney disease, unspecified whether stage 3a or 3b CKD: Secondary | ICD-10-CM

## 2019-09-21 DIAGNOSIS — Z940 Kidney transplant status: Principal | ICD-10-CM

## 2019-09-21 DIAGNOSIS — Z79899 Other long term (current) drug therapy: Principal | ICD-10-CM

## 2019-09-21 MED FILL — TACROLIMUS 1 MG CAPSULE, IMMEDIATE-RELEASE: 30 days supply | Qty: 180 | Fill #4 | Status: AC

## 2019-09-21 MED FILL — TACROLIMUS 1 MG CAPSULE, IMMEDIATE-RELEASE: ORAL | 30 days supply | Qty: 180 | Fill #4

## 2019-09-21 MED FILL — MYFORTIC 180 MG TABLET,DELAYED RELEASE: 30 days supply | Qty: 180 | Fill #4 | Status: AC

## 2019-09-21 MED FILL — MYFORTIC 180 MG TABLET,DELAYED RELEASE: ORAL | 30 days supply | Qty: 180 | Fill #4

## 2019-09-24 DIAGNOSIS — E11622 Type 2 diabetes mellitus with other skin ulcer: Principal | ICD-10-CM

## 2019-09-24 DIAGNOSIS — Z940 Kidney transplant status: Principal | ICD-10-CM

## 2019-09-24 DIAGNOSIS — L03119 Cellulitis of unspecified part of limb: Principal | ICD-10-CM

## 2019-09-24 DIAGNOSIS — M14671 Charcot's joint, right ankle and foot: Principal | ICD-10-CM

## 2019-09-24 DIAGNOSIS — L97309 Non-pressure chronic ulcer of unspecified ankle with unspecified severity: Secondary | ICD-10-CM

## 2019-09-24 DIAGNOSIS — M86671 Other chronic osteomyelitis, right ankle and foot: Principal | ICD-10-CM

## 2019-09-24 DIAGNOSIS — S80819A Abrasion, unspecified lower leg, initial encounter: Principal | ICD-10-CM

## 2019-09-24 MED ORDER — SULFAMETHOXAZOLE 800 MG-TRIMETHOPRIM 160 MG TABLET
ORAL_TABLET | ORAL | 0 refills | 0.00000 days | Status: CP
Start: 2019-09-24 — End: ?

## 2019-09-29 ENCOUNTER — Ambulatory Visit: Admit: 2019-09-29 | Discharge: 2019-09-29 | Payer: MEDICAID | Attending: Registered" | Primary: Registered"

## 2019-09-29 ENCOUNTER — Encounter: Admit: 2019-09-29 | Discharge: 2019-09-29 | Payer: MEDICAID | Attending: Podiatrist | Primary: Podiatrist

## 2019-09-29 ENCOUNTER — Ambulatory Visit: Admit: 2019-09-29 | Discharge: 2019-09-29 | Payer: MEDICAID | Attending: Family Medicine | Primary: Family Medicine

## 2019-09-29 DIAGNOSIS — E669 Obesity, unspecified: Principal | ICD-10-CM

## 2019-09-29 DIAGNOSIS — S80821A Blister (nonthermal), right lower leg, initial encounter: Principal | ICD-10-CM

## 2019-09-29 DIAGNOSIS — I10 Essential (primary) hypertension: Principal | ICD-10-CM

## 2019-09-29 DIAGNOSIS — E11621 Type 2 diabetes mellitus with foot ulcer: Principal | ICD-10-CM

## 2019-09-29 DIAGNOSIS — L97529 Non-pressure chronic ulcer of other part of left foot with unspecified severity: Principal | ICD-10-CM

## 2019-09-29 DIAGNOSIS — Z940 Kidney transplant status: Principal | ICD-10-CM

## 2019-09-29 DIAGNOSIS — Z6836 Body mass index (BMI) 36.0-36.9, adult: Principal | ICD-10-CM

## 2019-09-29 DIAGNOSIS — I739 Peripheral vascular disease, unspecified: Principal | ICD-10-CM

## 2019-09-29 DIAGNOSIS — S90822A Blister (nonthermal), left foot, initial encounter: Principal | ICD-10-CM

## 2019-09-29 DIAGNOSIS — L97912 Non-pressure chronic ulcer of unspecified part of right lower leg with fat layer exposed: Principal | ICD-10-CM

## 2019-09-29 DIAGNOSIS — Z79899 Other long term (current) drug therapy: Principal | ICD-10-CM

## 2019-09-29 DIAGNOSIS — D849 Immunodeficiency, unspecified: Principal | ICD-10-CM

## 2019-09-29 DIAGNOSIS — I890 Lymphedema, not elsewhere classified: Principal | ICD-10-CM

## 2019-09-30 ENCOUNTER — Ambulatory Visit: Admit: 2019-09-30 | Discharge: 2019-09-30 | Payer: MEDICAID

## 2019-09-30 ENCOUNTER — Encounter
Admit: 2019-09-30 | Discharge: 2019-09-30 | Payer: MEDICAID | Attending: Nurse Practitioner | Primary: Nurse Practitioner

## 2019-09-30 ENCOUNTER — Ambulatory Visit: Admit: 2019-09-30 | Discharge: 2019-09-30 | Payer: MEDICAID | Attending: Nephrology | Primary: Nephrology

## 2019-09-30 DIAGNOSIS — D631 Anemia in chronic kidney disease: Secondary | ICD-10-CM

## 2019-09-30 DIAGNOSIS — Z940 Kidney transplant status: Principal | ICD-10-CM

## 2019-09-30 DIAGNOSIS — L97509 Non-pressure chronic ulcer of other part of unspecified foot with unspecified severity: Principal | ICD-10-CM

## 2019-09-30 DIAGNOSIS — N183 Anemia in stage 3 chronic kidney disease, unspecified whether stage 3a or 3b CKD: Principal | ICD-10-CM

## 2019-09-30 DIAGNOSIS — Z9483 Pancreas transplant status: Principal | ICD-10-CM

## 2019-09-30 DIAGNOSIS — Z79899 Other long term (current) drug therapy: Principal | ICD-10-CM

## 2019-09-30 DIAGNOSIS — D849 Immunodeficiency, unspecified: Principal | ICD-10-CM

## 2019-09-30 DIAGNOSIS — R21 Rash and other nonspecific skin eruption: Principal | ICD-10-CM

## 2019-09-30 MED ORDER — TOPIRAMATE 25 MG TABLET
ORAL_TABLET | Freq: Every evening | ORAL | 1 refills | 60.00000 days | PRN
Start: 2019-09-30 — End: ?

## 2019-09-30 MED ORDER — SODIUM BICARBONATE 650 MG TABLET
ORAL_TABLET | Freq: Three times a day (TID) | ORAL | 11 refills | 30 days | Status: CP
Start: 2019-09-30 — End: ?

## 2019-10-01 ENCOUNTER — Encounter: Admit: 2019-10-01 | Discharge: 2019-10-02 | Payer: MEDICAID

## 2019-10-01 DIAGNOSIS — Z940 Kidney transplant status: Principal | ICD-10-CM

## 2019-10-01 DIAGNOSIS — D849 Immunodeficiency, unspecified: Principal | ICD-10-CM

## 2019-10-06 ENCOUNTER — Ambulatory Visit: Admit: 2019-10-06 | Discharge: 2019-10-07 | Payer: MEDICAID | Attending: Podiatrist | Primary: Podiatrist

## 2019-10-06 ENCOUNTER — Ambulatory Visit: Admit: 2019-10-06 | Discharge: 2019-10-07 | Payer: MEDICAID

## 2019-10-06 DIAGNOSIS — L97912 Non-pressure chronic ulcer of unspecified part of right lower leg with fat layer exposed: Principal | ICD-10-CM

## 2019-10-06 DIAGNOSIS — L97529 Non-pressure chronic ulcer of other part of left foot with unspecified severity: Principal | ICD-10-CM

## 2019-10-06 DIAGNOSIS — I890 Lymphedema, not elsewhere classified: Principal | ICD-10-CM

## 2019-10-06 DIAGNOSIS — E11621 Type 2 diabetes mellitus with foot ulcer: Secondary | ICD-10-CM

## 2019-10-06 DIAGNOSIS — L97522 Non-pressure chronic ulcer of other part of left foot with fat layer exposed: Principal | ICD-10-CM

## 2019-10-06 DIAGNOSIS — Z940 Kidney transplant status: Principal | ICD-10-CM

## 2019-10-06 DIAGNOSIS — L97509 Non-pressure chronic ulcer of other part of unspecified foot with unspecified severity: Principal | ICD-10-CM

## 2019-10-06 DIAGNOSIS — D849 Immunodeficiency, unspecified: Principal | ICD-10-CM

## 2019-10-06 DIAGNOSIS — I739 Peripheral vascular disease, unspecified: Principal | ICD-10-CM

## 2019-10-06 MED ORDER — TACROLIMUS 1 MG CAPSULE, IMMEDIATE-RELEASE
ORAL_CAPSULE | 11 refills | 0 days
Start: 2019-10-06 — End: ?

## 2019-10-07 ENCOUNTER — Ambulatory Visit
Admit: 2019-10-07 | Discharge: 2019-10-08 | Payer: MEDICAID | Attending: Obstetrics & Gynecology | Primary: Obstetrics & Gynecology

## 2019-10-07 DIAGNOSIS — R87612 Low grade squamous intraepithelial lesion on cytologic smear of cervix (LGSIL): Principal | ICD-10-CM

## 2019-10-08 ENCOUNTER — Encounter: Admit: 2019-10-08 | Discharge: 2019-10-09 | Payer: MEDICAID

## 2019-10-08 DIAGNOSIS — Z79899 Other long term (current) drug therapy: Principal | ICD-10-CM

## 2019-10-08 DIAGNOSIS — N183 Anemia in stage 3 chronic kidney disease, unspecified whether stage 3a or 3b CKD: Secondary | ICD-10-CM

## 2019-10-08 DIAGNOSIS — D631 Anemia in chronic kidney disease: Secondary | ICD-10-CM

## 2019-10-08 DIAGNOSIS — Z9483 Pancreas transplant status: Principal | ICD-10-CM

## 2019-10-08 DIAGNOSIS — Z940 Kidney transplant status: Principal | ICD-10-CM

## 2019-10-12 MED ORDER — FUROSEMIDE 20 MG TABLET
ORAL_TABLET | Freq: Every day | ORAL | 1 refills | 30 days
Start: 2019-10-12 — End: 2019-12-11

## 2019-10-13 DIAGNOSIS — Z940 Kidney transplant status: Principal | ICD-10-CM

## 2019-10-13 MED ORDER — TACROLIMUS 1 MG CAPSULE, IMMEDIATE-RELEASE
ORAL_CAPSULE | 11 refills | 0 days | Status: CP
Start: 2019-10-13 — End: ?
  Filled 2019-10-27: qty 150, 30d supply, fill #0

## 2019-10-14 ENCOUNTER — Ambulatory Visit: Admit: 2019-10-14 | Discharge: 2019-10-15 | Payer: MEDICAID

## 2019-10-14 ENCOUNTER — Encounter: Admit: 2019-10-14 | Discharge: 2019-10-15 | Payer: MEDICAID | Attending: Vascular Surgery | Primary: Vascular Surgery

## 2019-10-14 DIAGNOSIS — I739 Peripheral vascular disease, unspecified: Principal | ICD-10-CM

## 2019-10-17 ENCOUNTER — Ambulatory Visit: Admit: 2019-10-17 | Discharge: 2019-10-18 | Payer: MEDICAID

## 2019-10-17 DIAGNOSIS — H8111 Benign paroxysmal vertigo, right ear: Principal | ICD-10-CM

## 2019-10-17 DIAGNOSIS — R42 Dizziness and giddiness: Principal | ICD-10-CM

## 2019-10-17 MED ORDER — MECLIZINE 12.5 MG TABLET
ORAL_TABLET | Freq: Three times a day (TID) | ORAL | 1 refills | 7.00000 days | Status: CP | PRN
Start: 2019-10-17 — End: ?

## 2019-10-19 ENCOUNTER — Ambulatory Visit
Admit: 2019-10-19 | Discharge: 2019-11-09 | Payer: MEDICAID | Attending: Rehabilitative and Restorative Service Providers" | Primary: Rehabilitative and Restorative Service Providers"

## 2019-10-19 ENCOUNTER — Encounter: Admit: 2019-10-19 | Discharge: 2019-11-09 | Payer: MEDICAID

## 2019-10-20 ENCOUNTER — Encounter: Admit: 2019-10-20 | Discharge: 2019-10-21 | Payer: MEDICAID | Attending: Podiatrist | Primary: Podiatrist

## 2019-10-20 ENCOUNTER — Ambulatory Visit: Admit: 2019-10-20 | Discharge: 2019-10-21 | Payer: MEDICAID

## 2019-10-20 ENCOUNTER — Encounter: Admit: 2019-10-20 | Discharge: 2019-10-21 | Payer: MEDICAID

## 2019-10-20 DIAGNOSIS — I739 Peripheral vascular disease, unspecified: Principal | ICD-10-CM

## 2019-10-20 DIAGNOSIS — L97509 Non-pressure chronic ulcer of other part of unspecified foot with unspecified severity: Principal | ICD-10-CM

## 2019-10-20 DIAGNOSIS — D849 Immunodeficiency, unspecified: Principal | ICD-10-CM

## 2019-10-20 DIAGNOSIS — L97529 Non-pressure chronic ulcer of other part of left foot with unspecified severity: Secondary | ICD-10-CM

## 2019-10-20 DIAGNOSIS — I890 Lymphedema, not elsewhere classified: Principal | ICD-10-CM

## 2019-10-20 DIAGNOSIS — E11621 Type 2 diabetes mellitus with foot ulcer: Secondary | ICD-10-CM

## 2019-10-20 DIAGNOSIS — L97912 Non-pressure chronic ulcer of unspecified part of right lower leg with fat layer exposed: Principal | ICD-10-CM

## 2019-10-27 ENCOUNTER — Ambulatory Visit: Admit: 2019-10-27 | Discharge: 2019-10-28 | Payer: MEDICAID | Attending: Podiatrist | Primary: Podiatrist

## 2019-10-27 DIAGNOSIS — R609 Edema, unspecified: Principal | ICD-10-CM

## 2019-10-27 DIAGNOSIS — E11621 Type 2 diabetes mellitus with foot ulcer: Principal | ICD-10-CM

## 2019-10-27 DIAGNOSIS — D849 Immunodeficiency, unspecified: Principal | ICD-10-CM

## 2019-10-27 DIAGNOSIS — I739 Peripheral vascular disease, unspecified: Principal | ICD-10-CM

## 2019-10-27 DIAGNOSIS — L97509 Non-pressure chronic ulcer of other part of unspecified foot with unspecified severity: Principal | ICD-10-CM

## 2019-10-27 DIAGNOSIS — I890 Lymphedema, not elsewhere classified: Principal | ICD-10-CM

## 2019-10-27 DIAGNOSIS — L97529 Non-pressure chronic ulcer of other part of left foot with unspecified severity: Principal | ICD-10-CM

## 2019-10-27 DIAGNOSIS — M25372 Other instability, left ankle: Principal | ICD-10-CM

## 2019-10-27 MED FILL — MYFORTIC 180 MG TABLET,DELAYED RELEASE: ORAL | 30 days supply | Qty: 180 | Fill #5

## 2019-10-27 MED FILL — MYFORTIC 180 MG TABLET,DELAYED RELEASE: 30 days supply | Qty: 180 | Fill #5 | Status: AC

## 2019-10-27 MED FILL — TACROLIMUS 1 MG CAPSULE, IMMEDIATE-RELEASE: 30 days supply | Qty: 150 | Fill #0 | Status: AC

## 2019-10-29 ENCOUNTER — Telehealth
Admit: 2019-10-29 | Discharge: 2019-10-30 | Payer: MEDICAID | Attending: Student in an Organized Health Care Education/Training Program | Primary: Student in an Organized Health Care Education/Training Program

## 2019-10-29 MED ORDER — CLONAZEPAM 0.5 MG TABLET
ORAL_TABLET | Freq: Every evening | ORAL | 3 refills | 30.00000 days | Status: CP | PRN
Start: 2019-10-29 — End: 2020-10-28

## 2019-11-03 ENCOUNTER — Encounter: Admit: 2019-11-03 | Discharge: 2019-11-03 | Payer: MEDICAID | Attending: Family Medicine | Primary: Family Medicine

## 2019-11-03 ENCOUNTER — Institutional Professional Consult (permissible substitution): Admit: 2019-11-03 | Discharge: 2019-11-03 | Payer: MEDICAID

## 2019-11-03 ENCOUNTER — Ambulatory Visit: Admit: 2019-11-03 | Discharge: 2019-11-03 | Payer: MEDICAID | Attending: Registered" | Primary: Registered"

## 2019-11-03 DIAGNOSIS — Z79899 Other long term (current) drug therapy: Principal | ICD-10-CM

## 2019-11-03 DIAGNOSIS — I10 Essential (primary) hypertension: Principal | ICD-10-CM

## 2019-11-03 DIAGNOSIS — Z6836 Body mass index (BMI) 36.0-36.9, adult: Principal | ICD-10-CM

## 2019-11-03 DIAGNOSIS — E669 Obesity, unspecified: Principal | ICD-10-CM

## 2019-11-03 DIAGNOSIS — D631 Anemia in chronic kidney disease: Principal | ICD-10-CM

## 2019-11-03 DIAGNOSIS — Z9483 Pancreas transplant status: Principal | ICD-10-CM

## 2019-11-03 DIAGNOSIS — N183 Anemia in stage 3 chronic kidney disease, unspecified whether stage 3a or 3b CKD: Principal | ICD-10-CM

## 2019-11-03 DIAGNOSIS — E8881 Metabolic syndrome: Principal | ICD-10-CM

## 2019-11-03 DIAGNOSIS — D849 Immunodeficiency, unspecified: Principal | ICD-10-CM

## 2019-11-03 DIAGNOSIS — Z940 Kidney transplant status: Principal | ICD-10-CM

## 2019-11-03 MED ORDER — SEMAGLUTIDE 0.25 MG OR 0.5 MG (2 MG/1.5 ML) SUBCUTANEOUS PEN INJECTOR
SUBCUTANEOUS | 2 refills | 70 days | Status: CP
Start: 2019-11-03 — End: ?

## 2019-11-10 ENCOUNTER — Ambulatory Visit: Admit: 2019-11-10 | Discharge: 2019-11-11 | Payer: MEDICAID | Attending: Podiatrist | Primary: Podiatrist

## 2019-11-10 ENCOUNTER — Ambulatory Visit: Admit: 2019-11-10 | Discharge: 2019-11-11 | Payer: MEDICAID

## 2019-11-10 DIAGNOSIS — Z940 Kidney transplant status: Principal | ICD-10-CM

## 2019-11-10 DIAGNOSIS — Z8639 Personal history of other endocrine, nutritional and metabolic disease: Principal | ICD-10-CM

## 2019-11-10 DIAGNOSIS — L97522 Non-pressure chronic ulcer of other part of left foot with fat layer exposed: Principal | ICD-10-CM

## 2019-11-10 DIAGNOSIS — L03116 Cellulitis of left lower limb: Principal | ICD-10-CM

## 2019-11-11 ENCOUNTER — Encounter
Admit: 2019-11-11 | Discharge: 2019-12-09 | Payer: MEDICAID | Attending: Rehabilitative and Restorative Service Providers" | Primary: Rehabilitative and Restorative Service Providers"

## 2019-11-11 ENCOUNTER — Ambulatory Visit
Admit: 2019-11-11 | Discharge: 2019-12-09 | Payer: MEDICAID | Attending: Rehabilitative and Restorative Service Providers" | Primary: Rehabilitative and Restorative Service Providers"

## 2019-11-11 ENCOUNTER — Encounter: Admit: 2019-11-11 | Discharge: 2019-12-09 | Payer: MEDICAID

## 2019-11-11 ENCOUNTER — Ambulatory Visit: Admit: 2019-11-11 | Discharge: 2019-12-09 | Payer: MEDICAID

## 2019-11-13 ENCOUNTER — Telehealth
Admit: 2019-11-13 | Discharge: 2019-11-13 | Payer: MEDICAID | Attending: Infectious Disease | Primary: Infectious Disease

## 2019-11-13 ENCOUNTER — Encounter: Admit: 2019-11-13 | Discharge: 2019-11-13 | Payer: MEDICAID | Attending: Podiatrist | Primary: Podiatrist

## 2019-11-13 DIAGNOSIS — E11610 Type 2 diabetes mellitus with diabetic neuropathic arthropathy: Principal | ICD-10-CM

## 2019-11-13 DIAGNOSIS — B9689 Other specified bacterial agents as the cause of diseases classified elsewhere: Principal | ICD-10-CM

## 2019-11-13 DIAGNOSIS — L97522 Non-pressure chronic ulcer of other part of left foot with fat layer exposed: Principal | ICD-10-CM

## 2019-11-13 DIAGNOSIS — L089 Local infection of the skin and subcutaneous tissue, unspecified: Principal | ICD-10-CM

## 2019-11-13 DIAGNOSIS — L03116 Cellulitis of left lower limb: Principal | ICD-10-CM

## 2019-11-13 MED ORDER — LEVOFLOXACIN 500 MG TABLET
ORAL_TABLET | ORAL | 1 refills | 30 days | Status: CP
Start: 2019-11-13 — End: ?

## 2019-11-13 MED ORDER — LINEZOLID 600 MG TABLET
ORAL_TABLET | Freq: Two times a day (BID) | ORAL | 0 refills | 21.00000 days | Status: CP
Start: 2019-11-13 — End: 2019-12-04

## 2019-11-13 MED ORDER — SODIUM HYPOCHLORITE 0.25 % SOLUTION
Freq: Once | TOPICAL | 0 refills | 0 days | Status: CP
Start: 2019-11-13 — End: 2019-11-13

## 2019-11-17 ENCOUNTER — Ambulatory Visit: Admit: 2019-11-17 | Discharge: 2019-11-18 | Payer: MEDICAID | Attending: Podiatrist | Primary: Podiatrist

## 2019-11-17 DIAGNOSIS — E10621 Type 1 diabetes mellitus with foot ulcer: Principal | ICD-10-CM

## 2019-11-17 DIAGNOSIS — L97529 Non-pressure chronic ulcer of other part of left foot with unspecified severity: Principal | ICD-10-CM

## 2019-11-17 DIAGNOSIS — Z940 Kidney transplant status: Principal | ICD-10-CM

## 2019-11-17 DIAGNOSIS — L97525 Non-pressure chronic ulcer of other part of left foot with muscle involvement without evidence of necrosis: Principal | ICD-10-CM

## 2019-11-17 DIAGNOSIS — M14671 Charcot's joint, right ankle and foot: Principal | ICD-10-CM

## 2019-11-17 DIAGNOSIS — I7090 Unspecified atherosclerosis: Principal | ICD-10-CM

## 2019-11-18 DIAGNOSIS — Z940 Kidney transplant status: Principal | ICD-10-CM

## 2019-11-18 DIAGNOSIS — N183 Anemia in stage 3 chronic kidney disease, unspecified whether stage 3a or 3b CKD: Principal | ICD-10-CM

## 2019-11-18 DIAGNOSIS — Z79899 Other long term (current) drug therapy: Principal | ICD-10-CM

## 2019-11-18 DIAGNOSIS — Z9483 Pancreas transplant status: Principal | ICD-10-CM

## 2019-11-18 DIAGNOSIS — D631 Anemia in chronic kidney disease: Principal | ICD-10-CM

## 2019-11-18 DIAGNOSIS — M86672 Other chronic osteomyelitis, left ankle and foot: Principal | ICD-10-CM

## 2019-11-23 ENCOUNTER — Ambulatory Visit: Admit: 2019-11-23 | Discharge: 2019-11-24 | Payer: MEDICAID

## 2019-11-24 ENCOUNTER — Ambulatory Visit: Admit: 2019-11-24 | Discharge: 2019-11-24 | Payer: MEDICAID

## 2019-11-24 ENCOUNTER — Encounter: Admit: 2019-11-24 | Discharge: 2019-11-24 | Payer: MEDICAID | Attending: Podiatrist | Primary: Podiatrist

## 2019-11-24 DIAGNOSIS — L97523 Non-pressure chronic ulcer of other part of left foot with necrosis of muscle: Secondary | ICD-10-CM

## 2019-11-24 DIAGNOSIS — Z940 Kidney transplant status: Principal | ICD-10-CM

## 2019-11-24 DIAGNOSIS — M86172 Other acute osteomyelitis, left ankle and foot: Principal | ICD-10-CM

## 2019-11-24 DIAGNOSIS — E11621 Type 2 diabetes mellitus with foot ulcer: Secondary | ICD-10-CM

## 2019-11-24 DIAGNOSIS — L97522 Non-pressure chronic ulcer of other part of left foot with fat layer exposed: Principal | ICD-10-CM

## 2019-11-25 MED FILL — MYFORTIC 180 MG TABLET,DELAYED RELEASE: ORAL | 30 days supply | Qty: 180 | Fill #6

## 2019-11-25 MED FILL — TACROLIMUS 1 MG CAPSULE, IMMEDIATE-RELEASE: ORAL | 30 days supply | Qty: 150 | Fill #1

## 2019-11-25 MED FILL — TACROLIMUS 1 MG CAPSULE, IMMEDIATE-RELEASE: 30 days supply | Qty: 150 | Fill #1 | Status: AC

## 2019-11-25 MED FILL — MYFORTIC 180 MG TABLET,DELAYED RELEASE: 30 days supply | Qty: 180 | Fill #6 | Status: AC

## 2019-12-03 ENCOUNTER — Encounter: Admit: 2019-12-03 | Discharge: 2019-12-04 | Payer: MEDICAID

## 2019-12-08 ENCOUNTER — Encounter: Admit: 2019-12-08 | Discharge: 2019-12-09 | Payer: MEDICAID

## 2019-12-08 DIAGNOSIS — M14671 Charcot's joint, right ankle and foot: Principal | ICD-10-CM

## 2019-12-08 DIAGNOSIS — E10621 Type 1 diabetes mellitus with foot ulcer: Principal | ICD-10-CM

## 2019-12-08 DIAGNOSIS — L97529 Non-pressure chronic ulcer of other part of left foot with unspecified severity: Secondary | ICD-10-CM

## 2019-12-09 DIAGNOSIS — L97529 Non-pressure chronic ulcer of other part of left foot with unspecified severity: Secondary | ICD-10-CM

## 2019-12-09 DIAGNOSIS — E10621 Type 1 diabetes mellitus with foot ulcer: Principal | ICD-10-CM

## 2019-12-09 DIAGNOSIS — M14671 Charcot's joint, right ankle and foot: Principal | ICD-10-CM

## 2019-12-09 DIAGNOSIS — N1831 Stage 3a chronic kidney disease (CMS-HCC): Principal | ICD-10-CM

## 2019-12-10 ENCOUNTER — Encounter: Admit: 2019-12-10 | Discharge: 2019-12-10 | Payer: MEDICAID

## 2019-12-10 DIAGNOSIS — Z79899 Other long term (current) drug therapy: Principal | ICD-10-CM

## 2019-12-10 DIAGNOSIS — Z940 Kidney transplant status: Principal | ICD-10-CM

## 2019-12-10 DIAGNOSIS — D849 Immunodeficiency, unspecified: Principal | ICD-10-CM

## 2019-12-10 DIAGNOSIS — E860 Dehydration: Principal | ICD-10-CM

## 2019-12-10 DIAGNOSIS — Z9483 Pancreas transplant status: Principal | ICD-10-CM

## 2019-12-11 ENCOUNTER — Telehealth
Admit: 2019-12-11 | Discharge: 2019-12-12 | Payer: MEDICAID | Attending: Infectious Disease | Primary: Infectious Disease

## 2019-12-14 ENCOUNTER — Encounter
Admit: 2019-12-14 | Discharge: 2019-12-15 | Payer: MEDICAID | Attending: Physician Assistant | Primary: Physician Assistant

## 2019-12-15 ENCOUNTER — Encounter: Admit: 2019-12-15 | Discharge: 2019-12-15 | Payer: MEDICAID | Attending: Registered" | Primary: Registered"

## 2019-12-15 ENCOUNTER — Encounter: Admit: 2019-12-15 | Discharge: 2019-12-15 | Payer: MEDICAID | Attending: Family Medicine | Primary: Family Medicine

## 2019-12-15 ENCOUNTER — Encounter: Admit: 2019-12-15 | Discharge: 2019-12-15 | Payer: MEDICAID

## 2019-12-15 DIAGNOSIS — E669 Obesity, unspecified: Principal | ICD-10-CM

## 2019-12-15 DIAGNOSIS — Z6833 Body mass index (BMI) 33.0-33.9, adult: Principal | ICD-10-CM

## 2019-12-15 DIAGNOSIS — E8881 Metabolic syndrome: Principal | ICD-10-CM

## 2019-12-16 ENCOUNTER — Ambulatory Visit: Admit: 2019-12-16 | Discharge: 2019-12-16 | Payer: MEDICAID

## 2019-12-16 ENCOUNTER — Encounter: Admit: 2019-12-16 | Discharge: 2019-12-16 | Payer: MEDICAID

## 2019-12-16 ENCOUNTER — Encounter: Admit: 2019-12-16 | Discharge: 2019-12-16 | Payer: MEDICAID | Attending: Anesthesiology | Primary: Anesthesiology

## 2019-12-16 MED ORDER — OXYCODONE-ACETAMINOPHEN 5 MG-325 MG TABLET
ORAL_TABLET | ORAL | 0 refills | 4.00000 days | Status: CP | PRN
Start: 2019-12-16 — End: 2019-12-23

## 2019-12-24 ENCOUNTER — Encounter: Admit: 2019-12-24 | Discharge: 2019-12-25 | Payer: MEDICAID

## 2019-12-24 DIAGNOSIS — Z09 Encounter for follow-up examination after completed treatment for conditions other than malignant neoplasm: Principal | ICD-10-CM

## 2019-12-24 DIAGNOSIS — E1042 Type 1 diabetes mellitus with diabetic polyneuropathy: Principal | ICD-10-CM

## 2019-12-24 DIAGNOSIS — M14671 Charcot's joint, right ankle and foot: Principal | ICD-10-CM

## 2019-12-24 MED FILL — TACROLIMUS 1 MG CAPSULE, IMMEDIATE-RELEASE: ORAL | 30 days supply | Qty: 150 | Fill #2

## 2019-12-24 MED FILL — TACROLIMUS 1 MG CAPSULE, IMMEDIATE-RELEASE: 30 days supply | Qty: 150 | Fill #2 | Status: AC

## 2019-12-24 MED FILL — MYFORTIC 180 MG TABLET,DELAYED RELEASE: ORAL | 30 days supply | Qty: 180 | Fill #7

## 2019-12-24 MED FILL — MYFORTIC 180 MG TABLET,DELAYED RELEASE: 30 days supply | Qty: 180 | Fill #7 | Status: AC

## 2019-12-29 ENCOUNTER — Telehealth
Admit: 2019-12-29 | Discharge: 2019-12-30 | Payer: MEDICAID | Attending: Infectious Disease | Primary: Infectious Disease

## 2019-12-29 DIAGNOSIS — L089 Local infection of the skin and subcutaneous tissue, unspecified: Principal | ICD-10-CM

## 2020-01-05 ENCOUNTER — Encounter: Admit: 2020-01-05 | Discharge: 2020-01-06 | Payer: MEDICAID

## 2020-01-05 DIAGNOSIS — Z9181 History of falling: Principal | ICD-10-CM

## 2020-01-05 DIAGNOSIS — I129 Hypertensive chronic kidney disease with stage 1 through stage 4 chronic kidney disease, or unspecified chronic kidney disease: Principal | ICD-10-CM

## 2020-01-05 DIAGNOSIS — Z792 Long term (current) use of antibiotics: Principal | ICD-10-CM

## 2020-01-05 DIAGNOSIS — M14671 Charcot's joint, right ankle and foot: Principal | ICD-10-CM

## 2020-01-05 DIAGNOSIS — E785 Hyperlipidemia, unspecified: Principal | ICD-10-CM

## 2020-01-05 DIAGNOSIS — N183 Chronic kidney disease, stage 3 unspecified: Principal | ICD-10-CM

## 2020-01-05 DIAGNOSIS — Z09 Encounter for follow-up examination after completed treatment for conditions other than malignant neoplasm: Principal | ICD-10-CM

## 2020-01-05 DIAGNOSIS — E669 Obesity, unspecified: Principal | ICD-10-CM

## 2020-01-05 DIAGNOSIS — Z4802 Encounter for removal of sutures: Principal | ICD-10-CM

## 2020-01-05 DIAGNOSIS — E1051 Type 1 diabetes mellitus with diabetic peripheral angiopathy without gangrene: Principal | ICD-10-CM

## 2020-01-05 DIAGNOSIS — E1042 Type 1 diabetes mellitus with diabetic polyneuropathy: Principal | ICD-10-CM

## 2020-01-05 DIAGNOSIS — Z9483 Pancreas transplant status: Principal | ICD-10-CM

## 2020-01-05 DIAGNOSIS — E1022 Type 1 diabetes mellitus with diabetic chronic kidney disease: Principal | ICD-10-CM

## 2020-01-05 DIAGNOSIS — T8130XA Disruption of wound, unspecified, initial encounter: Principal | ICD-10-CM

## 2020-01-05 DIAGNOSIS — Z89422 Acquired absence of other left toe(s): Principal | ICD-10-CM

## 2020-01-05 DIAGNOSIS — Z940 Kidney transplant status: Principal | ICD-10-CM

## 2020-01-05 DIAGNOSIS — E10610 Type 1 diabetes mellitus with diabetic neuropathic arthropathy: Principal | ICD-10-CM

## 2020-01-18 ENCOUNTER — Encounter: Admit: 2020-01-18 | Discharge: 2020-01-19 | Payer: MEDICAID

## 2020-01-18 DIAGNOSIS — Z09 Encounter for follow-up examination after completed treatment for conditions other than malignant neoplasm: Principal | ICD-10-CM

## 2020-01-18 DIAGNOSIS — Z4822 Encounter for aftercare following kidney transplant: Principal | ICD-10-CM

## 2020-01-18 DIAGNOSIS — D631 Anemia in chronic kidney disease: Principal | ICD-10-CM

## 2020-01-18 DIAGNOSIS — Z79899 Other long term (current) drug therapy: Principal | ICD-10-CM

## 2020-01-18 DIAGNOSIS — N183 Anemia in stage 3 chronic kidney disease, unspecified whether stage 3a or 3b CKD (CMS-HCC): Principal | ICD-10-CM

## 2020-01-18 DIAGNOSIS — Z9483 Pancreas transplant status: Principal | ICD-10-CM

## 2020-01-18 DIAGNOSIS — Z940 Kidney transplant status: Principal | ICD-10-CM

## 2020-01-19 ENCOUNTER — Encounter: Admit: 2020-01-19 | Discharge: 2020-01-20 | Payer: MEDICAID

## 2020-01-19 DIAGNOSIS — E1042 Type 1 diabetes mellitus with diabetic polyneuropathy: Principal | ICD-10-CM

## 2020-01-19 DIAGNOSIS — Z09 Encounter for follow-up examination after completed treatment for conditions other than malignant neoplasm: Principal | ICD-10-CM

## 2020-01-19 DIAGNOSIS — M14671 Charcot's joint, right ankle and foot: Principal | ICD-10-CM

## 2020-01-19 MED FILL — TACROLIMUS 1 MG CAPSULE, IMMEDIATE-RELEASE: ORAL | 30 days supply | Qty: 150 | Fill #3

## 2020-01-19 MED FILL — MYFORTIC 180 MG TABLET,DELAYED RELEASE: ORAL | 30 days supply | Qty: 180 | Fill #8

## 2020-01-19 MED FILL — TACROLIMUS 1 MG CAPSULE, IMMEDIATE-RELEASE: 30 days supply | Qty: 150 | Fill #3 | Status: AC

## 2020-01-19 MED FILL — MYFORTIC 180 MG TABLET,DELAYED RELEASE: 30 days supply | Qty: 180 | Fill #8 | Status: AC

## 2020-01-26 ENCOUNTER — Encounter: Admit: 2020-01-26 | Discharge: 2020-02-07 | Payer: MEDICAID

## 2020-01-26 DIAGNOSIS — Z940 Kidney transplant status: Principal | ICD-10-CM

## 2020-01-26 DIAGNOSIS — I890 Lymphedema, not elsewhere classified: Principal | ICD-10-CM

## 2020-01-27 MED ORDER — FUROSEMIDE 20 MG TABLET
ORAL_TABLET | 0 refills | 0 days
Start: 2020-01-27 — End: ?

## 2020-01-28 MED ORDER — FUROSEMIDE 20 MG TABLET
ORAL_TABLET | 0 refills | 0 days | Status: CP
Start: 2020-01-28 — End: 2020-02-03

## 2020-01-29 ENCOUNTER — Encounter
Admit: 2020-01-29 | Discharge: 2020-01-30 | Payer: MEDICAID | Attending: Infectious Disease | Primary: Infectious Disease

## 2020-01-29 DIAGNOSIS — M86672 Other chronic osteomyelitis, left ankle and foot: Principal | ICD-10-CM

## 2020-02-01 DIAGNOSIS — Z9483 Pancreas transplant status: Principal | ICD-10-CM

## 2020-02-01 DIAGNOSIS — Z79899 Other long term (current) drug therapy: Principal | ICD-10-CM

## 2020-02-01 DIAGNOSIS — Z940 Kidney transplant status: Principal | ICD-10-CM

## 2020-02-03 ENCOUNTER — Encounter: Admit: 2020-02-03 | Discharge: 2020-02-03 | Payer: MEDICAID | Attending: Nephrology | Primary: Nephrology

## 2020-02-03 ENCOUNTER — Encounter: Admit: 2020-02-03 | Discharge: 2020-02-03 | Payer: MEDICAID

## 2020-02-03 DIAGNOSIS — E872 Acidosis: Principal | ICD-10-CM

## 2020-02-03 DIAGNOSIS — Z9483 Pancreas transplant status: Principal | ICD-10-CM

## 2020-02-03 DIAGNOSIS — Z940 Kidney transplant status: Principal | ICD-10-CM

## 2020-02-03 DIAGNOSIS — I10 Essential (primary) hypertension: Principal | ICD-10-CM

## 2020-02-03 DIAGNOSIS — I890 Lymphedema, not elsewhere classified: Principal | ICD-10-CM

## 2020-02-03 DIAGNOSIS — Z23 Encounter for immunization: Principal | ICD-10-CM

## 2020-02-03 DIAGNOSIS — Z79899 Other long term (current) drug therapy: Principal | ICD-10-CM

## 2020-02-03 DIAGNOSIS — D849 Immunodeficiency, unspecified: Principal | ICD-10-CM

## 2020-02-03 DIAGNOSIS — N1832 Stage 3b chronic kidney disease (CMS-HCC): Principal | ICD-10-CM

## 2020-02-03 MED ORDER — FUROSEMIDE 20 MG TABLET
ORAL_TABLET | Freq: Every day | ORAL | 0 refills | 120 days
Start: 2020-02-03 — End: ?

## 2020-03-01 DIAGNOSIS — Z6839 Body mass index (BMI) 39.0-39.9, adult: Principal | ICD-10-CM

## 2020-03-01 DIAGNOSIS — E669 Obesity, unspecified: Principal | ICD-10-CM

## 2020-03-01 DIAGNOSIS — E8881 Metabolic syndrome: Principal | ICD-10-CM

## 2020-03-01 MED ORDER — SEMAGLUTIDE 0.25 MG OR 0.5 MG (2 MG/1.5 ML) SUBCUTANEOUS PEN INJECTOR
SUBCUTANEOUS | 2 refills | 70 days | Status: CP
Start: 2020-03-01 — End: ?

## 2020-03-08 MED FILL — TACROLIMUS 1 MG CAPSULE, IMMEDIATE-RELEASE: ORAL | 30 days supply | Qty: 150 | Fill #4

## 2020-03-08 MED FILL — MYFORTIC 180 MG TABLET,DELAYED RELEASE: ORAL | 30 days supply | Qty: 180 | Fill #9

## 2020-03-13 DIAGNOSIS — Z940 Kidney transplant status: Principal | ICD-10-CM

## 2020-03-23 ENCOUNTER — Encounter: Admit: 2020-03-23 | Discharge: 2020-03-24 | Payer: MEDICAID

## 2020-03-23 DIAGNOSIS — D631 Anemia in chronic kidney disease: Principal | ICD-10-CM

## 2020-03-23 DIAGNOSIS — Z940 Kidney transplant status: Principal | ICD-10-CM

## 2020-03-23 DIAGNOSIS — Z9483 Pancreas transplant status: Principal | ICD-10-CM

## 2020-03-23 DIAGNOSIS — Z79899 Other long term (current) drug therapy: Principal | ICD-10-CM

## 2020-03-23 DIAGNOSIS — N183 Chronic kidney disease, stage 3 unspecified: Principal | ICD-10-CM

## 2020-03-24 DIAGNOSIS — A09 Infectious gastroenteritis and colitis, unspecified: Principal | ICD-10-CM

## 2020-03-24 DIAGNOSIS — Z9483 Pancreas transplant status: Principal | ICD-10-CM

## 2020-03-24 DIAGNOSIS — Z940 Kidney transplant status: Principal | ICD-10-CM

## 2020-03-29 ENCOUNTER — Encounter: Admit: 2020-03-29 | Discharge: 2020-03-30 | Payer: MEDICAID | Attending: Medical | Primary: Medical

## 2020-03-29 DIAGNOSIS — R399 Unspecified symptoms and signs involving the genitourinary system: Principal | ICD-10-CM

## 2020-03-29 MED ORDER — SULFAMETHOXAZOLE 400 MG-TRIMETHOPRIM 80 MG TABLET
ORAL_TABLET | Freq: Two times a day (BID) | ORAL | 0 refills | 7 days | Status: CP
Start: 2020-03-29 — End: 2020-04-05

## 2020-04-06 ENCOUNTER — Encounter: Admit: 2020-04-06 | Discharge: 2020-04-07 | Payer: MEDICAID

## 2020-04-06 ENCOUNTER — Ambulatory Visit: Admit: 2020-04-06 | Discharge: 2020-04-07 | Payer: MEDICAID

## 2020-04-06 DIAGNOSIS — I890 Lymphedema, not elsewhere classified: Principal | ICD-10-CM

## 2020-04-06 DIAGNOSIS — Z940 Kidney transplant status: Principal | ICD-10-CM

## 2020-04-13 DIAGNOSIS — Z30430 Encounter for insertion of intrauterine contraceptive device: Principal | ICD-10-CM

## 2020-04-13 MED ORDER — MISOPROSTOL 200 MCG TABLET
ORAL_TABLET | Freq: Once | VAGINAL | 0 refills | 1.00000 days | Status: CP
Start: 2020-04-13 — End: 2020-04-13

## 2020-04-13 MED FILL — MYFORTIC 180 MG TABLET,DELAYED RELEASE: ORAL | 30 days supply | Qty: 180 | Fill #10

## 2020-04-13 MED FILL — TACROLIMUS 1 MG CAPSULE, IMMEDIATE-RELEASE: ORAL | 30 days supply | Qty: 150 | Fill #5

## 2020-04-19 ENCOUNTER — Encounter: Admit: 2020-04-19 | Discharge: 2020-04-20 | Payer: MEDICAID

## 2020-04-19 DIAGNOSIS — Z9483 Pancreas transplant status: Principal | ICD-10-CM

## 2020-04-19 DIAGNOSIS — Z940 Kidney transplant status: Principal | ICD-10-CM

## 2020-04-19 DIAGNOSIS — Z79899 Other long term (current) drug therapy: Principal | ICD-10-CM

## 2020-04-21 DIAGNOSIS — Z940 Kidney transplant status: Principal | ICD-10-CM

## 2020-04-21 MED ORDER — AMOXICILLIN 875 MG-POTASSIUM CLAVULANATE 125 MG TABLET
ORAL_TABLET | Freq: Two times a day (BID) | ORAL | 0 refills | 7.00000 days | Status: CP
Start: 2020-04-21 — End: 2020-04-28

## 2020-04-22 DIAGNOSIS — Z940 Kidney transplant status: Principal | ICD-10-CM

## 2020-04-22 MED ORDER — CIPROFLOXACIN 500 MG TABLET
ORAL_TABLET | Freq: Two times a day (BID) | ORAL | 0 refills | 10 days | Status: CP
Start: 2020-04-22 — End: 2020-05-02

## 2020-04-25 ENCOUNTER — Institutional Professional Consult (permissible substitution): Admit: 2020-04-25 | Discharge: 2020-04-26 | Payer: MEDICAID

## 2020-04-25 DIAGNOSIS — Z940 Kidney transplant status: Principal | ICD-10-CM

## 2020-04-25 DIAGNOSIS — D849 Immunodeficiency, unspecified: Principal | ICD-10-CM

## 2020-04-26 DIAGNOSIS — Z1231 Encounter for screening mammogram for malignant neoplasm of breast: Principal | ICD-10-CM

## 2020-05-02 MED ORDER — CARVEDILOL 25 MG TABLET
ORAL_TABLET | 0 refills | 0.00000 days
Start: 2020-05-02 — End: ?

## 2020-05-02 MED ORDER — GABAPENTIN 100 MG CAPSULE
ORAL_CAPSULE | 5 refills | 0.00000 days
Start: 2020-05-02 — End: ?

## 2020-05-03 MED ORDER — CARVEDILOL 25 MG TABLET
ORAL_TABLET | ORAL | 0 refills | 0.00000 days | Status: CP
Start: 2020-05-03 — End: 2020-05-03

## 2020-05-03 MED ORDER — METOCLOPRAMIDE 5 MG TABLET
ORAL_TABLET | 11 refills | 0.00000 days | Status: CP
Start: 2020-05-03 — End: 2020-05-03

## 2020-05-03 MED ORDER — GABAPENTIN 100 MG CAPSULE
ORAL_CAPSULE | ORAL | 5 refills | 0.00000 days | Status: CP
Start: 2020-05-03 — End: 2020-05-03

## 2020-05-05 ENCOUNTER — Encounter: Admit: 2020-05-05 | Discharge: 2020-05-06 | Payer: MEDICAID

## 2020-05-05 DIAGNOSIS — Z1231 Encounter for screening mammogram for malignant neoplasm of breast: Principal | ICD-10-CM

## 2020-05-10 ENCOUNTER — Encounter
Admit: 2020-05-10 | Discharge: 2020-05-11 | Payer: MEDICAID | Attending: Obstetrics & Gynecology | Primary: Obstetrics & Gynecology

## 2020-05-10 DIAGNOSIS — Z940 Kidney transplant status: Principal | ICD-10-CM

## 2020-05-10 DIAGNOSIS — N939 Abnormal uterine and vaginal bleeding, unspecified: Principal | ICD-10-CM

## 2020-05-10 MED ORDER — MYCOPHENOLATE SODIUM 180 MG TABLET,DELAYED RELEASE
ORAL_TABLET | Freq: Two times a day (BID) | ORAL | 11 refills | 30 days
Start: 2020-05-10 — End: ?

## 2020-05-11 DIAGNOSIS — Z940 Kidney transplant status: Principal | ICD-10-CM

## 2020-05-11 MED ORDER — COMPRESSION STOCKING, THIGH HIGH, LONG LENGTH, LARGE CIRCUMFERENCE
Freq: Every day | 2 refills | 0.00000 days | Status: CP
Start: 2020-05-11 — End: ?

## 2020-05-11 MED ORDER — MYCOPHENOLATE SODIUM 180 MG TABLET,DELAYED RELEASE
ORAL_TABLET | Freq: Two times a day (BID) | ORAL | 11 refills | 30 days | Status: CP
Start: 2020-05-11 — End: ?
  Filled 2020-05-12: qty 180, 30d supply, fill #0

## 2020-05-12 MED ORDER — COMPRESSION STOCKING, THIGH HIGH, LONG LENGTH, LARGE CIRCUMFERENCE
Freq: Every day | 2 refills | 0.00000 days | Status: CP
Start: 2020-05-12 — End: 2020-05-12

## 2020-05-12 MED FILL — TACROLIMUS 1 MG CAPSULE, IMMEDIATE-RELEASE: ORAL | 30 days supply | Qty: 150 | Fill #6

## 2020-05-18 ENCOUNTER — Encounter: Admit: 2020-05-18 | Discharge: 2020-05-19 | Payer: MEDICAID

## 2020-05-19 ENCOUNTER — Encounter: Admit: 2020-05-19 | Discharge: 2020-05-19 | Payer: MEDICAID | Attending: Family Medicine | Primary: Family Medicine

## 2020-05-19 ENCOUNTER — Ambulatory Visit: Admit: 2020-05-19 | Discharge: 2020-05-19 | Payer: MEDICAID | Attending: Registered" | Primary: Registered"

## 2020-05-19 DIAGNOSIS — E8881 Metabolic syndrome: Principal | ICD-10-CM

## 2020-05-19 DIAGNOSIS — Z6839 Body mass index (BMI) 39.0-39.9, adult: Principal | ICD-10-CM

## 2020-05-19 DIAGNOSIS — E669 Obesity, unspecified: Principal | ICD-10-CM

## 2020-05-19 MED ORDER — SEMAGLUTIDE 0.25 MG OR 0.5 MG (2 MG/1.5 ML) SUBCUTANEOUS PEN INJECTOR
SUBCUTANEOUS | 2 refills | 70 days | Status: CP
Start: 2020-05-19 — End: ?

## 2020-05-23 ENCOUNTER — Encounter: Admit: 2020-05-23 | Discharge: 2020-05-24 | Payer: MEDICAID

## 2020-05-23 DIAGNOSIS — Z79899 Other long term (current) drug therapy: Principal | ICD-10-CM

## 2020-05-23 DIAGNOSIS — Z940 Kidney transplant status: Principal | ICD-10-CM

## 2020-05-23 DIAGNOSIS — D849 Immunodeficiency, unspecified: Principal | ICD-10-CM

## 2020-05-23 DIAGNOSIS — N183 Anemia in stage 3 chronic kidney disease, unspecified whether stage 3a or 3b CKD (CMS-HCC): Principal | ICD-10-CM

## 2020-05-23 DIAGNOSIS — Z9483 Pancreas transplant status: Principal | ICD-10-CM

## 2020-05-23 DIAGNOSIS — D631 Anemia in chronic kidney disease: Principal | ICD-10-CM

## 2020-05-23 MED ORDER — TACROLIMUS 1 MG CAPSULE, IMMEDIATE-RELEASE
ORAL_CAPSULE | Freq: Two times a day (BID) | ORAL | 11 refills | 30.00000 days | Status: CP
Start: 2020-05-23 — End: 2021-05-23
  Filled 2020-06-29: qty 120, 30d supply, fill #0

## 2020-05-30 ENCOUNTER — Encounter: Admit: 2020-05-30 | Discharge: 2020-05-31 | Payer: MEDICAID | Attending: Family Medicine | Primary: Family Medicine

## 2020-05-30 DIAGNOSIS — M79605 Pain in left leg: Principal | ICD-10-CM

## 2020-05-30 DIAGNOSIS — M25562 Pain in left knee: Principal | ICD-10-CM

## 2020-06-01 DIAGNOSIS — Z940 Kidney transplant status: Principal | ICD-10-CM

## 2020-06-01 DIAGNOSIS — Z79899 Other long term (current) drug therapy: Principal | ICD-10-CM

## 2020-06-02 DIAGNOSIS — Z940 Kidney transplant status: Principal | ICD-10-CM

## 2020-06-08 ENCOUNTER — Ambulatory Visit: Admit: 2020-06-08 | Discharge: 2020-06-08 | Payer: MEDICAID

## 2020-06-08 ENCOUNTER — Encounter: Admit: 2020-06-08 | Discharge: 2020-06-08 | Payer: MEDICAID | Attending: Nephrology | Primary: Nephrology

## 2020-06-08 DIAGNOSIS — Z129 Encounter for screening for malignant neoplasm, site unspecified: Principal | ICD-10-CM

## 2020-06-08 DIAGNOSIS — Z940 Kidney transplant status: Principal | ICD-10-CM

## 2020-06-08 DIAGNOSIS — Z79899 Other long term (current) drug therapy: Principal | ICD-10-CM

## 2020-06-09 DIAGNOSIS — M86679 Other chronic osteomyelitis, unspecified ankle and foot: Principal | ICD-10-CM

## 2020-06-09 MED ORDER — FUROSEMIDE 20 MG TABLET
ORAL_TABLET | ORAL | 1 refills | 0.00000 days | Status: CP
Start: 2020-06-09 — End: ?

## 2020-06-09 MED ORDER — OMEPRAZOLE 40 MG CAPSULE,DELAYED RELEASE
ORAL_CAPSULE | 0 refills | 0 days | Status: CP
Start: 2020-06-09 — End: ?

## 2020-06-09 MED ORDER — DOXYCYCLINE HYCLATE 100 MG TABLET
ORAL_TABLET | 3 refills | 0 days | Status: CP
Start: 2020-06-09 — End: ?

## 2020-06-09 MED ORDER — ATORVASTATIN 40 MG TABLET
ORAL_TABLET | 3 refills | 0 days | Status: CP
Start: 2020-06-09 — End: ?

## 2020-06-20 ENCOUNTER — Encounter: Admit: 2020-06-20 | Discharge: 2020-06-21 | Payer: MEDICAID

## 2020-06-27 MED ORDER — PEG 3350-ELECTROLYTES 236 GRAM-22.74 GRAM-6.74 GRAM-5.86 GRAM SOLUTION
Freq: Once | ORAL | 0 refills | 1 days | Status: CP
Start: 2020-06-27 — End: 2020-06-28

## 2020-06-29 MED FILL — PEG 3350-ELECTROLYTES 236 GRAM-22.74 GRAM-6.74 GRAM-5.86 GRAM SOLUTION: ORAL | 1 days supply | Qty: 4000 | Fill #0

## 2020-06-29 MED FILL — MYFORTIC 180 MG TABLET,DELAYED RELEASE: ORAL | 30 days supply | Qty: 180 | Fill #1

## 2020-07-01 DIAGNOSIS — E8881 Metabolic syndrome: Principal | ICD-10-CM

## 2020-07-01 MED ORDER — SEMAGLUTIDE 0.25 MG OR 0.5 MG (2 MG/1.5 ML) SUBCUTANEOUS PEN INJECTOR
SUBCUTANEOUS | 2 refills | 84 days | Status: CP
Start: 2020-07-01 — End: ?

## 2020-07-18 ENCOUNTER — Encounter: Admit: 2020-07-18 | Discharge: 2020-07-18 | Payer: MEDICAID

## 2020-07-18 DIAGNOSIS — Z940 Kidney transplant status: Principal | ICD-10-CM

## 2020-07-18 DIAGNOSIS — D849 Immunodeficiency, unspecified: Principal | ICD-10-CM

## 2020-07-18 DIAGNOSIS — Z79899 Other long term (current) drug therapy: Principal | ICD-10-CM

## 2020-07-18 DIAGNOSIS — Z9483 Pancreas transplant status: Principal | ICD-10-CM

## 2020-07-20 ENCOUNTER — Encounter: Admit: 2020-07-20 | Discharge: 2020-07-21 | Payer: MEDICAID | Attending: Family | Primary: Family

## 2020-07-20 DIAGNOSIS — M545 Low back pain, unspecified back pain laterality, unspecified chronicity, unspecified whether sciatica present: Principal | ICD-10-CM

## 2020-07-20 DIAGNOSIS — I10 Essential (primary) hypertension: Principal | ICD-10-CM

## 2020-07-20 MED ORDER — CYCLOBENZAPRINE 5 MG TABLET
ORAL_TABLET | ORAL | 0 refills | 0.00000 days | Status: CP
Start: 2020-07-20 — End: ?

## 2020-07-20 MED ORDER — AMLODIPINE 5 MG TABLET
ORAL_TABLET | ORAL | 0 refills | 0.00000 days | Status: CP
Start: 2020-07-20 — End: ?

## 2020-07-26 MED FILL — TACROLIMUS 1 MG CAPSULE, IMMEDIATE-RELEASE: ORAL | 30 days supply | Qty: 120 | Fill #1

## 2020-07-26 MED FILL — MYFORTIC 180 MG TABLET,DELAYED RELEASE: ORAL | 30 days supply | Qty: 180 | Fill #2

## 2020-08-02 MED ORDER — CARVEDILOL 25 MG TABLET
ORAL_TABLET | 0 refills | 0 days | Status: CP
Start: 2020-08-02 — End: ?

## 2020-08-25 ENCOUNTER — Institutional Professional Consult (permissible substitution): Admit: 2020-08-25 | Discharge: 2020-08-25 | Payer: MEDICAID

## 2020-08-25 ENCOUNTER — Ambulatory Visit: Admit: 2020-08-25 | Discharge: 2020-08-25 | Payer: MEDICAID | Attending: Registered" | Primary: Registered"

## 2020-08-25 ENCOUNTER — Ambulatory Visit: Admit: 2020-08-25 | Discharge: 2020-08-25 | Payer: MEDICAID | Attending: Family Medicine | Primary: Family Medicine

## 2020-08-25 DIAGNOSIS — Z6832 Body mass index (BMI) 32.0-32.9, adult: Principal | ICD-10-CM

## 2020-08-25 DIAGNOSIS — Z79899 Other long term (current) drug therapy: Principal | ICD-10-CM

## 2020-08-25 DIAGNOSIS — E669 Obesity, unspecified: Principal | ICD-10-CM

## 2020-08-25 DIAGNOSIS — Z9483 Pancreas transplant status: Principal | ICD-10-CM

## 2020-08-25 DIAGNOSIS — E8881 Metabolic syndrome: Principal | ICD-10-CM

## 2020-08-25 DIAGNOSIS — Z940 Kidney transplant status: Principal | ICD-10-CM

## 2020-08-25 MED ORDER — SEMAGLUTIDE 0.25 MG OR 0.5 MG (2 MG/1.5 ML) SUBCUTANEOUS PEN INJECTOR
SUBCUTANEOUS | 3 refills | 70.00000 days | Status: CP
Start: 2020-08-25 — End: ?

## 2020-08-31 MED FILL — MYFORTIC 180 MG TABLET,DELAYED RELEASE: ORAL | 30 days supply | Qty: 180 | Fill #3

## 2020-08-31 MED FILL — TACROLIMUS 1 MG CAPSULE, IMMEDIATE-RELEASE: ORAL | 30 days supply | Qty: 120 | Fill #2

## 2020-09-09 ENCOUNTER — Ambulatory Visit: Admit: 2020-09-09 | Discharge: 2020-09-10 | Payer: MEDICAID

## 2020-09-09 DIAGNOSIS — N390 Urinary tract infection, site not specified: Principal | ICD-10-CM

## 2020-09-09 DIAGNOSIS — R319 Hematuria, unspecified: Principal | ICD-10-CM

## 2020-09-09 DIAGNOSIS — R3915 Urgency of urination: Principal | ICD-10-CM

## 2020-09-09 DIAGNOSIS — R350 Frequency of micturition: Principal | ICD-10-CM

## 2020-09-09 MED ORDER — SULFAMETHOXAZOLE 400 MG-TRIMETHOPRIM 80 MG TABLET
ORAL_TABLET | Freq: Two times a day (BID) | ORAL | 0 refills | 7 days | Status: CP
Start: 2020-09-09 — End: ?

## 2020-09-13 ENCOUNTER — Encounter: Admit: 2020-09-13 | Discharge: 2020-09-13 | Payer: MEDICAID

## 2020-10-06 MED ORDER — FUROSEMIDE 20 MG TABLET
ORAL_TABLET | 1 refills | 0.00000 days
Start: 2020-10-06 — End: ?

## 2020-10-07 MED ORDER — OMEPRAZOLE 40 MG CAPSULE,DELAYED RELEASE
ORAL_CAPSULE | Freq: Two times a day (BID) | ORAL | 3 refills | 90.00000 days | Status: CP
Start: 2020-10-07 — End: ?

## 2020-10-10 MED ORDER — FUROSEMIDE 20 MG TABLET
ORAL_TABLET | 2 refills | 0 days | Status: CP
Start: 2020-10-10 — End: ?

## 2020-10-14 ENCOUNTER — Institutional Professional Consult (permissible substitution): Admit: 2020-10-14 | Discharge: 2020-10-15 | Payer: MEDICAID

## 2020-10-14 DIAGNOSIS — Z0000 Encounter for general adult medical examination without abnormal findings: Principal | ICD-10-CM

## 2020-11-02 DIAGNOSIS — Z940 Kidney transplant status: Principal | ICD-10-CM

## 2020-11-02 DIAGNOSIS — Z79899 Other long term (current) drug therapy: Principal | ICD-10-CM

## 2020-11-03 ENCOUNTER — Ambulatory Visit: Admit: 2020-11-03 | Discharge: 2020-11-04 | Payer: MEDICAID | Attending: Nephrology | Primary: Nephrology

## 2020-11-03 DIAGNOSIS — E1042 Type 1 diabetes mellitus with diabetic polyneuropathy: Principal | ICD-10-CM

## 2020-11-03 DIAGNOSIS — Z940 Kidney transplant status: Principal | ICD-10-CM

## 2020-11-03 DIAGNOSIS — R059 Cough: Principal | ICD-10-CM

## 2020-11-03 DIAGNOSIS — N3000 Acute cystitis without hematuria: Principal | ICD-10-CM

## 2020-11-03 DIAGNOSIS — Z79899 Other long term (current) drug therapy: Principal | ICD-10-CM

## 2020-11-03 DIAGNOSIS — Z9483 Pancreas transplant status: Principal | ICD-10-CM

## 2020-11-03 MED ORDER — CIPROFLOXACIN 500 MG TABLET
ORAL_TABLET | Freq: Two times a day (BID) | ORAL | 0 refills | 10 days | Status: CP
Start: 2020-11-03 — End: 2020-11-13

## 2020-11-07 DIAGNOSIS — Z940 Kidney transplant status: Principal | ICD-10-CM

## 2020-11-07 MED ORDER — NITROFURANTOIN MONOHYDRATE/MACROCRYSTALS 100 MG CAPSULE
ORAL_CAPSULE | Freq: Two times a day (BID) | ORAL | 0 refills | 10 days | Status: CP
Start: 2020-11-07 — End: 2020-11-17

## 2020-11-16 DIAGNOSIS — N390 Urinary tract infection, site not specified: Principal | ICD-10-CM

## 2020-11-17 ENCOUNTER — Ambulatory Visit: Admit: 2020-11-17 | Discharge: 2020-11-17 | Payer: MEDICAID

## 2020-11-17 DIAGNOSIS — Z940 Kidney transplant status: Principal | ICD-10-CM

## 2020-11-17 DIAGNOSIS — R350 Frequency of micturition: Principal | ICD-10-CM

## 2020-11-17 DIAGNOSIS — R399 Unspecified symptoms and signs involving the genitourinary system: Principal | ICD-10-CM

## 2020-11-17 DIAGNOSIS — D849 Immunodeficiency, unspecified: Principal | ICD-10-CM

## 2020-11-17 DIAGNOSIS — N390 Urinary tract infection, site not specified: Principal | ICD-10-CM

## 2020-11-28 ENCOUNTER — Encounter: Admit: 2020-11-28 | Discharge: 2020-11-28 | Payer: MEDICAID | Attending: Nephrology | Primary: Nephrology

## 2020-11-28 DIAGNOSIS — N390 Urinary tract infection, site not specified: Principal | ICD-10-CM

## 2020-11-30 DIAGNOSIS — Z940 Kidney transplant status: Principal | ICD-10-CM

## 2020-11-30 DIAGNOSIS — Z79899 Other long term (current) drug therapy: Principal | ICD-10-CM

## 2020-12-06 ENCOUNTER — Ambulatory Visit: Admit: 2020-12-06 | Discharge: 2020-12-07 | Payer: MEDICAID | Attending: Family Medicine | Primary: Family Medicine

## 2020-12-06 DIAGNOSIS — I10 Essential (primary) hypertension: Principal | ICD-10-CM

## 2020-12-06 DIAGNOSIS — E669 Obesity, unspecified: Principal | ICD-10-CM

## 2020-12-06 DIAGNOSIS — E8881 Metabolic syndrome: Principal | ICD-10-CM

## 2020-12-06 MED ORDER — SEMAGLUTIDE 0.25 MG OR 0.5 MG (2 MG/1.5 ML) SUBCUTANEOUS PEN INJECTOR
SUBCUTANEOUS | 3 refills | 70 days | Status: CP
Start: 2020-12-06 — End: ?

## 2020-12-07 MED FILL — TACROLIMUS 1 MG CAPSULE, IMMEDIATE-RELEASE: ORAL | 30 days supply | Qty: 120 | Fill #3

## 2020-12-07 MED FILL — MYFORTIC 180 MG TABLET,DELAYED RELEASE: ORAL | 30 days supply | Qty: 180 | Fill #4

## 2020-12-26 MED ORDER — CARVEDILOL 25 MG TABLET
ORAL_TABLET | 0 refills | 0 days
Start: 2020-12-26 — End: ?

## 2020-12-26 MED ORDER — PREDNISONE 2.5 MG TABLET
ORAL_TABLET | Freq: Every day | ORAL | 3 refills | 0.00000 days
Start: 2020-12-26 — End: ?

## 2020-12-26 MED ORDER — AMLODIPINE 5 MG TABLET
ORAL_TABLET | 0 refills | 0 days
Start: 2020-12-26 — End: ?

## 2020-12-27 MED ORDER — AMLODIPINE 5 MG TABLET
ORAL_TABLET | 0 refills | 0 days | Status: CP
Start: 2020-12-27 — End: ?

## 2020-12-27 MED ORDER — PREDNISONE 2.5 MG TABLET
ORAL_TABLET | Freq: Every day | ORAL | 3 refills | 90 days | Status: CP
Start: 2020-12-27 — End: ?

## 2020-12-27 MED ORDER — CARVEDILOL 25 MG TABLET
ORAL_TABLET | 0 refills | 0 days | Status: CP
Start: 2020-12-27 — End: ?

## 2021-01-02 MED FILL — TACROLIMUS 1 MG CAPSULE, IMMEDIATE-RELEASE: ORAL | 30 days supply | Qty: 120 | Fill #4

## 2021-01-02 MED FILL — MYFORTIC 180 MG TABLET,DELAYED RELEASE: ORAL | 30 days supply | Qty: 180 | Fill #5

## 2021-01-11 ENCOUNTER — Ambulatory Visit: Admit: 2021-01-11 | Discharge: 2021-01-12 | Payer: MEDICAID

## 2021-01-11 ENCOUNTER — Institutional Professional Consult (permissible substitution): Admit: 2021-01-11 | Discharge: 2021-01-12 | Payer: MEDICAID

## 2021-01-11 DIAGNOSIS — Z79899 Other long term (current) drug therapy: Principal | ICD-10-CM

## 2021-01-11 DIAGNOSIS — Z940 Kidney transplant status: Principal | ICD-10-CM

## 2021-01-11 DIAGNOSIS — Z9483 Pancreas transplant status: Principal | ICD-10-CM

## 2021-01-27 DIAGNOSIS — Z940 Kidney transplant status: Principal | ICD-10-CM

## 2021-01-27 MED FILL — TACROLIMUS 1 MG CAPSULE, IMMEDIATE-RELEASE: ORAL | 30 days supply | Qty: 120 | Fill #5

## 2021-01-27 MED FILL — MYFORTIC 180 MG TABLET,DELAYED RELEASE: ORAL | 30 days supply | Qty: 180 | Fill #6

## 2021-02-10 ENCOUNTER — Institutional Professional Consult (permissible substitution): Admit: 2021-02-10 | Discharge: 2021-02-11 | Payer: MEDICAID

## 2021-02-10 DIAGNOSIS — Z940 Kidney transplant status: Principal | ICD-10-CM

## 2021-02-10 DIAGNOSIS — Z9483 Pancreas transplant status: Principal | ICD-10-CM

## 2021-02-10 DIAGNOSIS — Z79899 Other long term (current) drug therapy: Principal | ICD-10-CM

## 2021-02-10 DIAGNOSIS — D849 Immunodeficiency, unspecified: Principal | ICD-10-CM

## 2021-02-21 DIAGNOSIS — L97529 Non-pressure chronic ulcer of other part of left foot with unspecified severity: Principal | ICD-10-CM

## 2021-02-21 DIAGNOSIS — E10621 Type 1 diabetes mellitus with foot ulcer: Principal | ICD-10-CM

## 2021-02-22 MED FILL — TACROLIMUS 1 MG CAPSULE, IMMEDIATE-RELEASE: ORAL | 30 days supply | Qty: 120 | Fill #6

## 2021-02-22 MED FILL — MYFORTIC 180 MG TABLET,DELAYED RELEASE: ORAL | 30 days supply | Qty: 180 | Fill #7

## 2021-02-23 ENCOUNTER — Ambulatory Visit: Admit: 2021-02-23 | Discharge: 2021-02-24 | Payer: MEDICAID

## 2021-02-23 DIAGNOSIS — R350 Frequency of micturition: Principal | ICD-10-CM

## 2021-02-23 MED ORDER — NITROFURANTOIN MONOHYDRATE/MACROCRYSTALS 100 MG CAPSULE
ORAL_CAPSULE | Freq: Two times a day (BID) | ORAL | 0 refills | 5 days | Status: CP
Start: 2021-02-23 — End: 2021-02-28

## 2021-03-07 ENCOUNTER — Ambulatory Visit: Admit: 2021-03-07 | Discharge: 2021-03-08 | Payer: MEDICAID | Attending: Family Medicine | Primary: Family Medicine

## 2021-03-07 DIAGNOSIS — E8881 Metabolic syndrome: Principal | ICD-10-CM

## 2021-03-07 DIAGNOSIS — E669 Obesity, unspecified: Principal | ICD-10-CM

## 2021-03-07 DIAGNOSIS — I10 Essential (primary) hypertension: Principal | ICD-10-CM

## 2021-03-07 MED ORDER — SEMAGLUTIDE 1 MG/DOSE (2 MG/1.5 ML) SUBCUTANEOUS PEN INJECTOR
SUBCUTANEOUS | 3 refills | 84.00000 days | Status: CP
Start: 2021-03-07 — End: 2021-03-07

## 2021-03-07 MED ORDER — OZEMPIC 1 MG/DOSE (4 MG/3 ML) SUBCUTANEOUS PEN INJECTOR
SUBCUTANEOUS | 2 refills | 84 days | Status: CP
Start: 2021-03-07 — End: 2021-06-05

## 2021-03-14 DIAGNOSIS — E10319 Type 1 diabetes mellitus with unspecified diabetic retinopathy without macular edema: Secondary | ICD-10-CM | POA: Insufficient documentation

## 2021-03-23 ENCOUNTER — Ambulatory Visit: Admit: 2021-03-23 | Discharge: 2021-03-24 | Payer: MEDICAID

## 2021-03-23 DIAGNOSIS — Z79899 Other long term (current) drug therapy: Principal | ICD-10-CM

## 2021-03-23 DIAGNOSIS — L299 Pruritus, unspecified: Principal | ICD-10-CM

## 2021-03-23 DIAGNOSIS — L239 Allergic contact dermatitis, unspecified cause: Principal | ICD-10-CM

## 2021-03-23 DIAGNOSIS — Z940 Kidney transplant status: Principal | ICD-10-CM

## 2021-03-23 MED FILL — TACROLIMUS 1 MG CAPSULE, IMMEDIATE-RELEASE: ORAL | 30 days supply | Qty: 120 | Fill #7

## 2021-03-23 MED FILL — MYFORTIC 180 MG TABLET,DELAYED RELEASE: ORAL | 30 days supply | Qty: 180 | Fill #8

## 2021-03-24 ENCOUNTER — Institutional Professional Consult (permissible substitution): Admit: 2021-03-24 | Discharge: 2021-03-25 | Payer: MEDICAID

## 2021-03-24 DIAGNOSIS — D849 Immunodeficiency, unspecified: Principal | ICD-10-CM

## 2021-03-24 DIAGNOSIS — Z79899 Other long term (current) drug therapy: Principal | ICD-10-CM

## 2021-03-24 DIAGNOSIS — Z9483 Pancreas transplant status: Principal | ICD-10-CM

## 2021-03-24 DIAGNOSIS — L239 Allergic contact dermatitis, unspecified cause: Principal | ICD-10-CM

## 2021-03-24 DIAGNOSIS — Z940 Kidney transplant status: Principal | ICD-10-CM

## 2021-03-27 ENCOUNTER — Ambulatory Visit: Admit: 2021-03-27 | Discharge: 2021-03-27 | Payer: MEDICAID | Attending: Internal Medicine | Primary: Internal Medicine

## 2021-03-27 DIAGNOSIS — L239 Allergic contact dermatitis, unspecified cause: Principal | ICD-10-CM

## 2021-03-27 DIAGNOSIS — N1831 Stage 3a chronic kidney disease (CMS-HCC): Principal | ICD-10-CM

## 2021-03-27 DIAGNOSIS — E1042 Type 1 diabetes mellitus with diabetic polyneuropathy: Principal | ICD-10-CM

## 2021-03-27 DIAGNOSIS — Z940 Kidney transplant status: Principal | ICD-10-CM

## 2021-03-31 MED ORDER — SODIUM BICARBONATE 650 MG TABLET
ORAL_TABLET | Freq: Three times a day (TID) | ORAL | 11 refills | 30.00000 days | Status: CP
Start: 2021-03-31 — End: ?

## 2021-04-04 ENCOUNTER — Ambulatory Visit: Admit: 2021-04-04 | Discharge: 2021-04-05 | Payer: MEDICAID

## 2021-04-04 DIAGNOSIS — Z8639 Personal history of other endocrine, nutritional and metabolic disease: Principal | ICD-10-CM

## 2021-04-04 DIAGNOSIS — I739 Peripheral vascular disease, unspecified: Principal | ICD-10-CM

## 2021-04-04 DIAGNOSIS — E1042 Type 1 diabetes mellitus with diabetic polyneuropathy: Principal | ICD-10-CM

## 2021-04-04 DIAGNOSIS — M14679 Charcot's joint, unspecified ankle and foot: Principal | ICD-10-CM

## 2021-04-04 DIAGNOSIS — E10621 Type 1 diabetes mellitus with foot ulcer: Principal | ICD-10-CM

## 2021-04-04 DIAGNOSIS — L97419 Non-pressure chronic ulcer of right heel and midfoot with unspecified severity: Principal | ICD-10-CM

## 2021-04-15 MED ORDER — CARVEDILOL 25 MG TABLET
ORAL_TABLET | 0 refills | 0 days
Start: 2021-04-15 — End: ?

## 2021-04-16 MED ORDER — CARVEDILOL 25 MG TABLET
ORAL_TABLET | ORAL | 3 refills | 0.00000 days | Status: CP
Start: 2021-04-16 — End: ?

## 2021-04-18 ENCOUNTER — Ambulatory Visit: Admit: 2021-04-18 | Discharge: 2021-04-19 | Payer: MEDICAID

## 2021-04-18 DIAGNOSIS — I739 Peripheral vascular disease, unspecified: Principal | ICD-10-CM

## 2021-04-18 DIAGNOSIS — Z8639 Personal history of other endocrine, nutritional and metabolic disease: Principal | ICD-10-CM

## 2021-04-18 DIAGNOSIS — L97419 Non-pressure chronic ulcer of right heel and midfoot with unspecified severity: Principal | ICD-10-CM

## 2021-04-18 DIAGNOSIS — M14679 Charcot's joint, unspecified ankle and foot: Principal | ICD-10-CM

## 2021-04-18 DIAGNOSIS — E10621 Type 1 diabetes mellitus with foot ulcer: Principal | ICD-10-CM

## 2021-04-18 DIAGNOSIS — E1042 Type 1 diabetes mellitus with diabetic polyneuropathy: Principal | ICD-10-CM

## 2021-04-19 MED FILL — TACROLIMUS 1 MG CAPSULE, IMMEDIATE-RELEASE: ORAL | 30 days supply | Qty: 120 | Fill #8

## 2021-04-19 MED FILL — MYFORTIC 180 MG TABLET,DELAYED RELEASE: ORAL | 30 days supply | Qty: 180 | Fill #9

## 2021-05-04 ENCOUNTER — Ambulatory Visit: Admit: 2021-05-04 | Discharge: 2021-05-05 | Payer: MEDICAID

## 2021-05-04 DIAGNOSIS — I739 Peripheral vascular disease, unspecified: Principal | ICD-10-CM

## 2021-05-04 DIAGNOSIS — Z8639 Personal history of other endocrine, nutritional and metabolic disease: Principal | ICD-10-CM

## 2021-05-04 DIAGNOSIS — Z940 Kidney transplant status: Principal | ICD-10-CM

## 2021-05-04 DIAGNOSIS — E10621 Type 1 diabetes mellitus with foot ulcer: Principal | ICD-10-CM

## 2021-05-04 DIAGNOSIS — M14679 Charcot's joint, unspecified ankle and foot: Principal | ICD-10-CM

## 2021-05-04 DIAGNOSIS — M79671 Pain in right foot: Principal | ICD-10-CM

## 2021-05-04 DIAGNOSIS — E1042 Type 1 diabetes mellitus with diabetic polyneuropathy: Principal | ICD-10-CM

## 2021-05-04 DIAGNOSIS — L97419 Non-pressure chronic ulcer of right heel and midfoot with unspecified severity: Principal | ICD-10-CM

## 2021-05-04 DIAGNOSIS — M898X9 Other specified disorders of bone, unspecified site: Principal | ICD-10-CM

## 2021-05-08 ENCOUNTER — Ambulatory Visit: Admit: 2021-05-08 | Discharge: 2021-05-08 | Payer: MEDICAID

## 2021-05-08 DIAGNOSIS — Z1231 Encounter for screening mammogram for malignant neoplasm of breast: Principal | ICD-10-CM

## 2021-05-09 DIAGNOSIS — Z940 Kidney transplant status: Principal | ICD-10-CM

## 2021-05-09 DIAGNOSIS — Z79899 Other long term (current) drug therapy: Principal | ICD-10-CM

## 2021-05-11 ENCOUNTER — Ambulatory Visit: Admit: 2021-05-11 | Discharge: 2021-05-12 | Payer: MEDICAID | Attending: Nephrology | Primary: Nephrology

## 2021-05-11 DIAGNOSIS — R600 Localized edema: Principal | ICD-10-CM

## 2021-05-11 DIAGNOSIS — E872 Metabolic acidosis: Principal | ICD-10-CM

## 2021-05-11 DIAGNOSIS — Z79899 Other long term (current) drug therapy: Principal | ICD-10-CM

## 2021-05-11 DIAGNOSIS — Z940 Kidney transplant status: Principal | ICD-10-CM

## 2021-05-11 DIAGNOSIS — D849 Immunodeficiency, unspecified: Principal | ICD-10-CM

## 2021-05-11 DIAGNOSIS — N1831 Anemia in stage 3a chronic kidney disease (CMS-HCC): Principal | ICD-10-CM

## 2021-05-11 DIAGNOSIS — I10 Essential (primary) hypertension: Principal | ICD-10-CM

## 2021-05-11 DIAGNOSIS — D631 Anemia in chronic kidney disease: Principal | ICD-10-CM

## 2021-05-11 MED ORDER — CARVEDILOL 12.5 MG TABLET
ORAL_TABLET | Freq: Two times a day (BID) | ORAL | 3 refills | 45 days | Status: CP
Start: 2021-05-11 — End: ?

## 2021-05-11 MED ORDER — LISINOPRIL 5 MG TABLET
ORAL_TABLET | Freq: Every day | ORAL | 3 refills | 90 days | Status: CP
Start: 2021-05-11 — End: 2022-05-11

## 2021-05-18 ENCOUNTER — Ambulatory Visit: Admit: 2021-05-18 | Discharge: 2021-05-19 | Payer: MEDICAID | Attending: Family Medicine | Primary: Family Medicine

## 2021-05-18 DIAGNOSIS — E669 Obesity, unspecified: Principal | ICD-10-CM

## 2021-05-18 DIAGNOSIS — I10 Essential (primary) hypertension: Principal | ICD-10-CM

## 2021-05-18 DIAGNOSIS — Z940 Kidney transplant status: Principal | ICD-10-CM

## 2021-05-18 MED ORDER — OZEMPIC 1 MG/DOSE (4 MG/3 ML) SUBCUTANEOUS PEN INJECTOR
SUBCUTANEOUS | 2 refills | 84 days | Status: CP
Start: 2021-05-18 — End: 2021-08-16

## 2021-05-18 MED ORDER — METOCLOPRAMIDE 5 MG TABLET
ORAL_TABLET | 11 refills | 0 days | Status: CP
Start: 2021-05-18 — End: ?

## 2021-05-18 MED ORDER — MYCOPHENOLATE SODIUM 180 MG TABLET,DELAYED RELEASE
ORAL_TABLET | Freq: Two times a day (BID) | ORAL | 11 refills | 30 days | Status: CP
Start: 2021-05-18 — End: ?
  Filled 2021-05-18: qty 180, 30d supply, fill #0

## 2021-05-18 MED FILL — TACROLIMUS 1 MG CAPSULE, IMMEDIATE-RELEASE: ORAL | 30 days supply | Qty: 120 | Fill #9

## 2021-05-25 ENCOUNTER — Ambulatory Visit: Admit: 2021-05-25 | Discharge: 2021-05-26 | Payer: MEDICAID

## 2021-05-25 DIAGNOSIS — M79671 Pain in right foot: Principal | ICD-10-CM

## 2021-05-25 DIAGNOSIS — M14679 Charcot's joint, unspecified ankle and foot: Principal | ICD-10-CM

## 2021-05-25 DIAGNOSIS — I739 Peripheral vascular disease, unspecified: Principal | ICD-10-CM

## 2021-05-25 DIAGNOSIS — Z8639 Personal history of other endocrine, nutritional and metabolic disease: Principal | ICD-10-CM

## 2021-05-25 DIAGNOSIS — M898X9 Other specified disorders of bone, unspecified site: Principal | ICD-10-CM

## 2021-05-25 DIAGNOSIS — Z940 Kidney transplant status: Principal | ICD-10-CM

## 2021-05-25 DIAGNOSIS — E1042 Type 1 diabetes mellitus with diabetic polyneuropathy: Principal | ICD-10-CM

## 2021-06-08 DIAGNOSIS — E1042 Type 1 diabetes mellitus with diabetic polyneuropathy: Principal | ICD-10-CM

## 2021-06-08 MED ORDER — OZEMPIC 1 MG/DOSE (4 MG/3 ML) SUBCUTANEOUS PEN INJECTOR
SUBCUTANEOUS | 3 refills | 84 days | Status: CP
Start: 2021-06-08 — End: 2021-09-06

## 2021-06-09 ENCOUNTER — Institutional Professional Consult (permissible substitution): Admit: 2021-06-09 | Discharge: 2021-06-10 | Payer: MEDICAID

## 2021-06-09 DIAGNOSIS — Z940 Kidney transplant status: Principal | ICD-10-CM

## 2021-06-09 DIAGNOSIS — Z9483 Pancreas transplant status: Principal | ICD-10-CM

## 2021-06-09 DIAGNOSIS — D849 Immunodeficiency, unspecified: Principal | ICD-10-CM

## 2021-06-09 DIAGNOSIS — Z79899 Other long term (current) drug therapy: Principal | ICD-10-CM

## 2021-06-09 MED ORDER — TACROLIMUS 1 MG CAPSULE, IMMEDIATE-RELEASE
ORAL_CAPSULE | Freq: Two times a day (BID) | ORAL | 11 refills | 30 days | Status: CP
Start: 2021-06-09 — End: 2022-06-09
  Filled 2021-06-12: qty 120, 30d supply, fill #0

## 2021-06-12 MED FILL — MYFORTIC 180 MG TABLET,DELAYED RELEASE: ORAL | 30 days supply | Qty: 180 | Fill #1

## 2021-06-20 ENCOUNTER — Telehealth
Admit: 2021-06-20 | Discharge: 2021-06-21 | Payer: MEDICARE | Attending: Infectious Disease | Primary: Infectious Disease

## 2021-06-20 DIAGNOSIS — M86679 Other chronic osteomyelitis, unspecified ankle and foot: Principal | ICD-10-CM

## 2021-06-20 DIAGNOSIS — N390 Urinary tract infection, site not specified: Principal | ICD-10-CM

## 2021-06-20 MED ORDER — DOXYCYCLINE HYCLATE 100 MG TABLET
ORAL_TABLET | Freq: Two times a day (BID) | ORAL | 3 refills | 90 days | Status: CP
Start: 2021-06-20 — End: ?

## 2021-06-30 ENCOUNTER — Ambulatory Visit: Admit: 2021-06-30 | Discharge: 2021-07-01 | Payer: MEDICARE

## 2021-06-30 DIAGNOSIS — Z940 Kidney transplant status: Principal | ICD-10-CM

## 2021-06-30 DIAGNOSIS — R197 Diarrhea, unspecified: Principal | ICD-10-CM

## 2021-06-30 DIAGNOSIS — M549 Dorsalgia, unspecified: Principal | ICD-10-CM

## 2021-06-30 DIAGNOSIS — I959 Hypotension, unspecified: Principal | ICD-10-CM

## 2021-06-30 DIAGNOSIS — R3915 Urgency of urination: Principal | ICD-10-CM

## 2021-06-30 MED ORDER — CYCLOBENZAPRINE 5 MG TABLET
ORAL_TABLET | Freq: Every evening | ORAL | 0 refills | 30 days | Status: CP | PRN
Start: 2021-06-30 — End: ?

## 2021-07-03 DIAGNOSIS — Z940 Kidney transplant status: Principal | ICD-10-CM

## 2021-07-03 DIAGNOSIS — N393 Stress incontinence (female) (male): Principal | ICD-10-CM

## 2021-07-03 DIAGNOSIS — Z79899 Other long term (current) drug therapy: Principal | ICD-10-CM

## 2021-07-03 DIAGNOSIS — Z9483 Pancreas transplant status: Principal | ICD-10-CM

## 2021-07-06 ENCOUNTER — Ambulatory Visit: Admit: 2021-07-06 | Discharge: 2021-07-07 | Payer: MEDICAID

## 2021-07-06 DIAGNOSIS — Z87828 Personal history of other (healed) physical injury and trauma: Principal | ICD-10-CM

## 2021-07-06 DIAGNOSIS — Z940 Kidney transplant status: Principal | ICD-10-CM

## 2021-07-06 DIAGNOSIS — E1042 Type 1 diabetes mellitus with diabetic polyneuropathy: Principal | ICD-10-CM

## 2021-07-06 DIAGNOSIS — Z8639 Personal history of other endocrine, nutritional and metabolic disease: Principal | ICD-10-CM

## 2021-07-06 DIAGNOSIS — I739 Peripheral vascular disease, unspecified: Principal | ICD-10-CM

## 2021-07-06 DIAGNOSIS — M14679 Charcot's joint, unspecified ankle and foot: Principal | ICD-10-CM

## 2021-07-06 DIAGNOSIS — M898X9 Other specified disorders of bone, unspecified site: Principal | ICD-10-CM

## 2021-07-13 DIAGNOSIS — Z940 Kidney transplant status: Principal | ICD-10-CM

## 2021-07-13 MED ORDER — MYCOPHENOLATE SODIUM 180 MG TABLET,DELAYED RELEASE
ORAL_TABLET | Freq: Two times a day (BID) | ORAL | 11 refills | 30 days | Status: CP
Start: 2021-07-13 — End: ?
  Filled 2021-07-17: qty 180, 30d supply, fill #0

## 2021-07-14 ENCOUNTER — Telehealth: Admit: 2021-07-14 | Discharge: 2021-07-15 | Payer: MEDICARE

## 2021-07-14 DIAGNOSIS — I10 Essential (primary) hypertension: Principal | ICD-10-CM

## 2021-07-14 DIAGNOSIS — M549 Dorsalgia, unspecified: Principal | ICD-10-CM

## 2021-07-14 DIAGNOSIS — R197 Diarrhea, unspecified: Principal | ICD-10-CM

## 2021-07-14 DIAGNOSIS — R3915 Urgency of urination: Principal | ICD-10-CM

## 2021-07-17 MED FILL — TACROLIMUS 1 MG CAPSULE, IMMEDIATE-RELEASE: ORAL | 30 days supply | Qty: 120 | Fill #1

## 2021-07-25 DIAGNOSIS — Z940 Kidney transplant status: Principal | ICD-10-CM

## 2021-07-25 DIAGNOSIS — R32 Unspecified urinary incontinence: Principal | ICD-10-CM

## 2021-08-04 ENCOUNTER — Institutional Professional Consult (permissible substitution): Admit: 2021-08-04 | Discharge: 2021-08-05 | Payer: MEDICARE

## 2021-08-04 DIAGNOSIS — Z9483 Pancreas transplant status: Principal | ICD-10-CM

## 2021-08-04 DIAGNOSIS — Z940 Kidney transplant status: Principal | ICD-10-CM

## 2021-08-04 DIAGNOSIS — D849 Immunodeficiency, unspecified: Principal | ICD-10-CM

## 2021-08-04 DIAGNOSIS — Z79899 Other long term (current) drug therapy: Principal | ICD-10-CM

## 2021-08-08 DIAGNOSIS — Z940 Kidney transplant status: Principal | ICD-10-CM

## 2021-08-08 MED ORDER — CIPROFLOXACIN 500 MG TABLET
ORAL_TABLET | Freq: Two times a day (BID) | ORAL | 0 refills | 7 days | Status: CP
Start: 2021-08-08 — End: 2021-08-15

## 2021-08-16 MED FILL — TACROLIMUS 1 MG CAPSULE, IMMEDIATE-RELEASE: ORAL | 30 days supply | Qty: 120 | Fill #2

## 2021-08-16 MED FILL — MYCOPHENOLATE SODIUM 180 MG TABLET,DELAYED RELEASE: ORAL | 30 days supply | Qty: 180 | Fill #1

## 2021-09-01 ENCOUNTER — Institutional Professional Consult (permissible substitution): Admit: 2021-09-01 | Discharge: 2021-09-02 | Payer: MEDICARE

## 2021-09-01 DIAGNOSIS — D849 Immunodeficiency, unspecified: Principal | ICD-10-CM

## 2021-09-01 DIAGNOSIS — Z9483 Pancreas transplant status: Principal | ICD-10-CM

## 2021-09-01 DIAGNOSIS — Z940 Kidney transplant status: Principal | ICD-10-CM

## 2021-09-01 DIAGNOSIS — Z79899 Other long term (current) drug therapy: Principal | ICD-10-CM

## 2021-09-06 ENCOUNTER — Ambulatory Visit: Admit: 2021-09-06 | Discharge: 2021-09-07 | Payer: MEDICARE | Attending: Family Medicine | Primary: Family Medicine

## 2021-09-06 DIAGNOSIS — E663 Overweight: Principal | ICD-10-CM

## 2021-09-06 DIAGNOSIS — E8881 Metabolic syndrome: Principal | ICD-10-CM

## 2021-09-06 DIAGNOSIS — I10 Essential (primary) hypertension: Principal | ICD-10-CM

## 2021-09-12 DIAGNOSIS — E1042 Type 1 diabetes mellitus with diabetic polyneuropathy: Principal | ICD-10-CM

## 2021-09-12 MED ORDER — OZEMPIC 1 MG/DOSE (4 MG/3 ML) SUBCUTANEOUS PEN INJECTOR
SUBCUTANEOUS | 1 refills | 84 days | Status: CP
Start: 2021-09-12 — End: 2022-03-15

## 2021-09-13 MED FILL — MYCOPHENOLATE SODIUM 180 MG TABLET,DELAYED RELEASE: ORAL | 30 days supply | Qty: 180 | Fill #2

## 2021-09-13 MED FILL — TACROLIMUS 1 MG CAPSULE, IMMEDIATE-RELEASE: ORAL | 30 days supply | Qty: 120 | Fill #3

## 2021-10-10 ENCOUNTER — Ambulatory Visit: Admit: 2021-10-10 | Discharge: 2021-10-11 | Payer: MEDICARE

## 2021-10-10 DIAGNOSIS — M549 Dorsalgia, unspecified: Principal | ICD-10-CM

## 2021-10-16 MED FILL — MYCOPHENOLATE SODIUM 180 MG TABLET,DELAYED RELEASE: ORAL | 30 days supply | Qty: 180 | Fill #3

## 2021-10-16 MED FILL — TACROLIMUS 1 MG CAPSULE, IMMEDIATE-RELEASE: ORAL | 30 days supply | Qty: 120 | Fill #4

## 2021-10-20 ENCOUNTER — Ambulatory Visit
Admit: 2021-10-20 | Discharge: 2021-10-21 | Payer: MEDICARE | Attending: Rehabilitative and Restorative Service Providers" | Primary: Rehabilitative and Restorative Service Providers"

## 2021-10-31 ENCOUNTER — Ambulatory Visit: Admit: 2021-10-31 | Discharge: 2021-11-01 | Payer: MEDICARE

## 2021-11-13 MED FILL — MYCOPHENOLATE SODIUM 180 MG TABLET,DELAYED RELEASE: ORAL | 30 days supply | Qty: 180 | Fill #4

## 2021-11-13 MED FILL — TACROLIMUS 1 MG CAPSULE, IMMEDIATE-RELEASE: ORAL | 30 days supply | Qty: 120 | Fill #5

## 2021-11-14 DIAGNOSIS — Z79899 Other long term (current) drug therapy: Principal | ICD-10-CM

## 2021-11-14 DIAGNOSIS — Z940 Kidney transplant status: Principal | ICD-10-CM

## 2021-11-15 ENCOUNTER — Telehealth: Admit: 2021-11-15 | Discharge: 2021-11-16 | Payer: MEDICARE

## 2021-11-16 ENCOUNTER — Ambulatory Visit: Admit: 2021-11-16 | Discharge: 2021-11-17 | Payer: MEDICARE | Attending: Nephrology | Primary: Nephrology

## 2021-11-16 DIAGNOSIS — Z79899 Other long term (current) drug therapy: Principal | ICD-10-CM

## 2021-11-16 DIAGNOSIS — Z940 Kidney transplant status: Principal | ICD-10-CM

## 2021-11-16 MED ORDER — CARVEDILOL 6.25 MG TABLET
ORAL_TABLET | Freq: Two times a day (BID) | ORAL | 3 refills | 90 days | Status: CP
Start: 2021-11-16 — End: ?

## 2021-11-21 ENCOUNTER — Ambulatory Visit
Admit: 2021-11-21 | Discharge: 2021-11-22 | Payer: MEDICARE | Attending: Rehabilitative and Restorative Service Providers" | Primary: Rehabilitative and Restorative Service Providers"

## 2021-11-21 DIAGNOSIS — Z940 Kidney transplant status: Principal | ICD-10-CM

## 2021-11-21 MED ORDER — TACROLIMUS 1 MG CAPSULE, IMMEDIATE-RELEASE
ORAL_CAPSULE | 11 refills | 0 days | Status: CP
Start: 2021-11-21 — End: ?
  Filled 2021-12-07: qty 150, 30d supply, fill #0

## 2021-12-07 MED FILL — MYCOPHENOLATE SODIUM 180 MG TABLET,DELAYED RELEASE: ORAL | 30 days supply | Qty: 180 | Fill #5

## 2021-12-13 ENCOUNTER — Ambulatory Visit: Admit: 2021-12-13 | Discharge: 2021-12-13 | Payer: MEDICARE

## 2021-12-13 ENCOUNTER — Ambulatory Visit: Admit: 2021-12-13 | Discharge: 2021-12-13 | Payer: MEDICARE | Attending: Family Medicine | Primary: Family Medicine

## 2021-12-13 DIAGNOSIS — E1042 Type 1 diabetes mellitus with diabetic polyneuropathy: Principal | ICD-10-CM

## 2021-12-13 DIAGNOSIS — E663 Overweight: Principal | ICD-10-CM

## 2021-12-13 DIAGNOSIS — E88819 Insulin resistance: Principal | ICD-10-CM

## 2021-12-13 MED ORDER — OZEMPIC 1 MG/DOSE (4 MG/3 ML) SUBCUTANEOUS PEN INJECTOR
SUBCUTANEOUS | 1 refills | 84 days | Status: CP
Start: 2021-12-13 — End: 2022-06-15

## 2021-12-21 ENCOUNTER — Ambulatory Visit
Admit: 2021-12-21 | Discharge: 2021-12-22 | Payer: MEDICARE | Attending: Rehabilitative and Restorative Service Providers" | Primary: Rehabilitative and Restorative Service Providers"

## 2021-12-27 ENCOUNTER — Ambulatory Visit: Admit: 2021-12-27 | Discharge: 2021-12-28 | Payer: MEDICARE

## 2021-12-27 DIAGNOSIS — N9489 Other specified conditions associated with female genital organs and menstrual cycle: Principal | ICD-10-CM

## 2021-12-27 DIAGNOSIS — Z8742 Personal history of other diseases of the female genital tract: Principal | ICD-10-CM

## 2021-12-27 DIAGNOSIS — N3281 Overactive bladder: Principal | ICD-10-CM

## 2022-01-02 MED FILL — TACROLIMUS 1 MG CAPSULE, IMMEDIATE-RELEASE: ORAL | 30 days supply | Qty: 150 | Fill #1

## 2022-01-02 MED FILL — MYCOPHENOLATE SODIUM 180 MG TABLET,DELAYED RELEASE: ORAL | 30 days supply | Qty: 180 | Fill #6

## 2022-01-16 MED ORDER — METOCLOPRAMIDE 5 MG TABLET
ORAL_TABLET | Freq: Two times a day (BID) | ORAL | 3 refills | 90 days | Status: CP
Start: 2022-01-16 — End: ?

## 2022-01-16 MED ORDER — ATORVASTATIN 40 MG TABLET
ORAL_TABLET | Freq: Every day | ORAL | 3 refills | 90 days | Status: CP
Start: 2022-01-16 — End: ?

## 2022-01-16 MED ORDER — GABAPENTIN 100 MG CAPSULE
ORAL_CAPSULE | Freq: Every evening | ORAL | 3 refills | 90 days | Status: CP
Start: 2022-01-16 — End: ?

## 2022-01-16 MED ORDER — OMEPRAZOLE 40 MG CAPSULE,DELAYED RELEASE
ORAL_CAPSULE | Freq: Two times a day (BID) | ORAL | 3 refills | 90 days | Status: CP
Start: 2022-01-16 — End: ?

## 2022-01-17 MED ORDER — MIRABEGRON ER 25 MG TABLET,EXTENDED RELEASE 24 HR
ORAL_TABLET | Freq: Every day | ORAL | 3 refills | 30 days | Status: CP
Start: 2022-01-17 — End: ?

## 2022-01-26 ENCOUNTER — Institutional Professional Consult (permissible substitution): Admit: 2022-01-26 | Discharge: 2022-01-27 | Payer: MEDICARE

## 2022-01-26 DIAGNOSIS — Z9483 Pancreas transplant status: Principal | ICD-10-CM

## 2022-01-26 DIAGNOSIS — Z79899 Other long term (current) drug therapy: Principal | ICD-10-CM

## 2022-01-26 DIAGNOSIS — Z940 Kidney transplant status: Principal | ICD-10-CM

## 2022-02-05 ENCOUNTER — Ambulatory Visit: Admit: 2022-02-05 | Discharge: 2022-02-06 | Payer: MEDICARE

## 2022-02-05 ENCOUNTER — Ambulatory Visit: Payer: Medicaid Other | Attending: Pediatrics

## 2022-02-05 DIAGNOSIS — M25641 Stiffness of right hand, not elsewhere classified: Principal | ICD-10-CM

## 2022-02-05 DIAGNOSIS — M549 Dorsalgia, unspecified: Principal | ICD-10-CM

## 2022-02-05 DIAGNOSIS — M6281 Muscle weakness (generalized): Secondary | ICD-10-CM

## 2022-02-05 DIAGNOSIS — M6289 Other specified disorders of muscle: Secondary | ICD-10-CM

## 2022-02-05 DIAGNOSIS — R278 Other lack of coordination: Secondary | ICD-10-CM

## 2022-02-05 MED FILL — TACROLIMUS 1 MG CAPSULE, IMMEDIATE-RELEASE: ORAL | 30 days supply | Qty: 150 | Fill #2

## 2022-02-05 MED FILL — MYCOPHENOLATE SODIUM 180 MG TABLET,DELAYED RELEASE: ORAL | 30 days supply | Qty: 180 | Fill #7

## 2022-02-05 NOTE — Therapy (Signed)
OUTPATIENT PHYSICAL THERAPY FEMALE PELVIC EVALUATION   Patient Name: Kendra Schroeder MRN: [id number] DOB:August 17, 1970, 51 y.o.,, female Today's Date: 02/05/2022   PT End of Session - 02/05/22 0943     Visit Number 1    Number of Visits 10    Date for PT Re-Evaluation 04/16/22    Authorization Type IE: 02/05/22    PT Start Time 0930    PT Stop Time 1010    PT Time Calculation (min) 40 min    Activity Tolerance Patient tolerated treatment well             Past Medical History:  Diagnosis Date   CKD (chronic kidney disease), stage III (HCC)    Diabetes mellitus without complication (HCC)    GERD (gastroesophageal reflux disease)    Hypertension    Kidney transplant recipient    Myocardial infarct [facility])    Past Surgical History:  Procedure Laterality Date   CARPAL TUNNEL RELEASE Left    COMBINED KIDNEY-PANCREAS TRANSPLANT     EYE SURGERY     NEPHRECTOMY TRANSPLANTED ORGAN     TOE AMPUTATION     Patient Active Problem List   Diagnosis Date Noted   Atherosclerosis of native artery of left lower extremity with rest pain (HCC) 03/11/2018   Chest pain 01/25/2015   GERD (gastroesophageal reflux disease) 01/25/2015   Type 2 diabetes mellitus (HCC) 01/25/2015   HTN (hypertension) 01/25/2015   CKD (chronic kidney disease), stage III (HCC) 01/25/2015   Kidney transplant recipient 01/25/2015    PCP: Drinda Butts, MD  REFERRING PROVIDER: Garth Bigness, MD  REFERRING DIAG:  N32.81 (ICD-10-CM) - Overactive bladder  N94.89 (ICD-10-CM) - Other specified conditions associated with female genital organs and menstrual cycle  THERAPY DIAG:  Pelvic floor dysfunction  Other lack of coordination  Muscle weakness (generalized)  Rationale for Evaluation and Treatment: Rehabilitation  ONSET DATE: A little over a year ago  RED FLAGS: N/A Have you had any night sweats? Unexplained weight loss? Saddle anesthesia? Unexplained changes in bowel or bladder habits?    SUBJECTIVE: Patient confirms identification and approves PT to assess pelvic floor and treatment Yes                                                                                                                                                                                           PRECAUTIONS: None  WEIGHT BEARING RESTRICTIONS: No  FALLS:  Has patient fallen in last 6 months? No  OCCUPATION/SOCIAL ACTIVITIES: On disability, cooking, watching movies,   PLOF: Independent  PERTINENT HISTORY/CHART REVIEW: Kidney transplant - 1998  Kidney and pancreas transplant -  2010    LIVING ENVIRONMENT: Lives with: lives with their family Lives in: House/apartment   CHIEF CONCERN: Pt had noticed earlier this year increased urinary frequency/urgency. Pt follows-up regularly each year after kidney transplant bilaterally and pancreas (Pt had about 50-60 staples from both surgeries). Pt does report some mid-back (thoracic) pain that is intermittent. Pt describes the pain as sharp and achy. Pt also would like to address occasional constipation at times but not sure if it related to medications she is taking.    PAIN:  Are you having pain? No NPRS scale: 0/10   PATIENT GOALS: Pt would like to control the urinary urgency and ideally aid in constipation and having more frequency bowel movements without straining    UROLOGICAL HISTORY Fluid intake: water, occasionally sweet tea and soda, 10-12 oz   Pain with urination: No Fully empty bladder: Yes Toileting posture: feet flat Stream: Strong Urgency: Yes Frequency: 8-12x (more when having to do more with increased household chores)  Nocturia: 1x  Leakage: No but having to cross legs and rush to the bathroom Pads: Yes: just in case  Type: Pantyliner  Amount:  Bladder control (0-10): 7/10   GASTROINTESTINAL HISTORY  Pain with bowel movement: No Type of bowel movement:Type (Bristol Stool Scale) 1 and Strain No Frequency: every 2 days   Fully empty rectum: No Leakage: No   SEXUAL HISTORY/FUNCTION Pt has no concerns  Pain with intercourse:  Ability to have vaginal penetration:  ; Deep thrusting:  Able to achieve orgasm?:   OBSTETRICAL HISTORY Vaginal deliveries: G2P0 Tearing: No Currently pregnant: No  GYNECOLOGICAL HISTORY Hysterectomy: no Pelvic Organ Prolapse: None Pain with exam: no  Heaviness/pressure: no    OBJECTIVE:   COGNITION: Overall cognitive status: Within functional limits for tasks assessed     POSTURE: Deferred 2/2 time constraints  Grossly  Lumbar lordosis:   Thoracic kyphosis: Iliac crest height:  Lumbar lateral shift:  Pelvic obliquity:  Leg length discrepancy:   GAIT: Deferred 2/2 time constraints  Distance walked:  Assistive device utilized:  Level of assistance:  Comments:   Trendelenburg:   SENSATION: Deferred 2/2 time constraints  Light touch: , L2-S2 dermatomes  Proprioception:    RANGE OF MOTION:  Deferred 2/2 time constraints   (Norm range in degrees)  LEFT  RIGHT   Lumbar forward flexion (65):      Lumbar extension (30):     Lumbar lateral flexion (25):     Thoracic and Lumbar rotation (30 degrees):       Hip Flexion (0-125):      Hip IR (0-45):     Hip ER (0-45):     Hip Adduction:      Hip Abduction (0-40):     Hip extension (0-15):     (*= pain, Blank rows = not tested)   STRENGTH: MMT  Deferred 2/2 time constraints   RLE  LLE   Hip Flexion    Hip Extension    Hip Abduction     Hip Adduction     Hip ER     Hip IR     Knee Extension    Knee Flexion    Dorsiflexion     Plantarflexion (seated)    (*= pain, Blank rows = not tested)   SPECIAL TESTS: Deferred 2/2 time constraints  Centralization and Peripheralization (SN 92, -LR 0.12):  Slump (SN 83, -LR 0.32):  SLR (SN 92, -LR 0.29): R: Lumbar quadrant (SN 70): R:  FABER (SN 81): FADIR (SN  94):  Hip scour (SN 50):  Thigh Thrust (SN 88, -LR 0.18) : Distraction [id number]):  Compression  (SN/SP 69): Stork/March (SP 93):   PHYSICAL PERFORMANCE MEASURES: Deferred 2/2 time constraints   STS:  RLE SLS:  LLE SLS:  6 MWT:  10MWT:  5TSTS:   PALPATION: Deferred 2/2 time constraints  Abdominal:  Diastasis:  finger above umbilicus,  fingers at and below umbilicus  Scar mobility: present/mobile perpendicular, parallel Rib flare: present/absent  EXTERNAL PELVIC EXAM: Patient educated on the purpose of the pelvic exam and articulated understanding; patient consented to the exam verbally. Deferred 2/2 time constraints  Palpation: Breath coordination: present/absent/inconsistent Voluntary Contraction: present/absent Relaxation: full/delayed/non-relaxing Perineal movement with sustained IAP increase ("bear down"): descent/no change/elevation/excessive descent Perineal movement with rapid IAP increase ("cough"): elevation/no change/descent Pubic symphysis: (0= no contraction, 1= flicker, 2= weak squeeze, 3= fair squeeze with lift, 4= good squeeze and lift against resistance, 5= strong squeeze against strong resistance)   INTERNAL PELVIC EXAM: Patient educated on the purpose of the pelvic exam and articulated understanding; patient consented to the exam verbally. Deferred 2/2 to time constraints Introitus Appears:  Skin integrity:  Scar mobility: Strength (PERF):  Symmetry: Palpation: Prolapse: (0= no contraction, 1= flicker, 2= weak squeeze, 3= fair squeeze with lift, 4= good squeeze and lift against resistance, 5= strong squeeze against strong resistance)    Patient Education:  Patient educated on what to expect during course of physical therapy, POC, and provided with HEP including: toileting posture and breathing mechanics during toileting. Patient verbalized understanding and returned demonstration. Patient will benefit from further education in order to maximize compliance and understanding for Spiller-term therapeutic gains.   Patient Surveys:  FOTO Urinary Problem - 72   FOTO Bowel Constipation - 44     ASSESSMENT:  Clinical Impression: Patient is a 51 y.o. who was seen today for physical therapy evaluation and treatment for a chief concern of urinary urgency. Today's evaluation suggest deficits in IAP management, PFM coordination, PFM extensibility, posture, pain, scar mobility, and gait as evidenced by observed leg length discrepancy possibly due to surgical rod placed in RLE (Pt reports a decrease LE height and has a heel lift that attaches to outside of shoe but does not wear while driving in the community due to safety concern), in seated B rounded shoulders, intermittent upper back/scapular pain described as sharp (worst 10/10 -NPRS) but is constantly a dull ache, urinary frequency (8-12x/day), urinary urgency which increases bodily compensations (increased forward flexion and crossing of B legs), use of pantyliner "just in case" urinary leakage occurs, on average Type 1 BMs (Bristol Stool Chart) with straining episodes at least 1x per week, and significant hx of abdominal surgery.  Patient's responses on FOTO Urinary Problem (72) and FOTO Bowel Constipation (44) indicates moderate limitation/disability/distress. Patient's progress may be limited due to time since onset; however, patient's motivation is advantageous. Pt with basic understanding of PFM function in bowel/bladder habits, sexual function, posture, and breathing mechanics. Patient will benefit from skilled therapeutic intervention to address deficits in IAP management, PFM coordination, PFM extensibility, posture, pain, scar mobility, and gait in order to increase PLOF and improve overall QOL.    Objective Impairments: Abnormal gait, decreased coordination, decreased strength, increased fascial restrictions, improper body mechanics, postural dysfunction, and pain.   Activity Limitations: toileting and locomotion level  Personal Factors: Age, Past/current experiences, Time since onset of  injury/illness/exacerbation, and 3+ comorbidities: HTN, T2DM, anemia  are also affecting patient's functional outcome.   Rehab Potential: Good  Clinical Decision Making: Evolving/moderate complexity  Evaluation Complexity: Moderate   GOALS: Goals reviewed with patient? Yes  SHORT TERM GOALS: Target date: 03/12/2022  Patient will demonstrate independence with HEP in order to maximize therapeutic gains and improve carryover from physical therapy sessions to ADLs in the home and community. Baseline: toileting posture handout given Goal status: INITIAL   Geier TERM GOALS: Target date: 04/16/2022   Patient will demonstrate coordinated lengthening and relaxation of PFM with diaphragmatic inhalation in order to decrease spasm and allow for unrestricted elimination of urine/feces for improved overall QOL. Baseline: on average Type 1 BMs and having to strain  Goal status: INITIAL  2.  Patient will be able to demonstrate improved postural alignment in standing and sitting and overall improved muscular strength in order to reduce pain and restore proper gait mechanics to aid in complete resolution of pelvic floor dysfunction.  Baseline: B rounded shoulders and will assess gait further next session, RLE  Goal status: INITIAL  3.  Patient will decrease worst pain as reported on NPRS by at least 2 points to demonstrate clinically significant reduction in pain in order to restore/improve function and overall QOL. Baseline: 10/10 L upper back/scapular region pain/has to use Salonpas every day Goal status: INITIAL  4.  Patient will report confidence in ability to control bladder > 9/10 in order to demonstrate improved function and ability to participate more fully in activities at home and in the community. Baseline: wears pantyliner "just in case" everyday and 7/10 confidence  Goal status: INITIAL  5.  Patient will score >/= 75 and 55 on FOTO Urinary Problem and Bowel Constipation in order to  demonstrate improved IAP management, improved PFM coordination/PFM extensibility, and overall QOL.   Baseline: 72 and 44 Goal status: INITIAL   PLAN: PT Frequency: 1x/week  PT Duration: 10 weeks  Planned Interventions: Therapeutic exercises, Therapeutic activity, Neuromuscular re-education, Balance training, Gait training, Patient/Family education, Self Care, Joint mobilization, Spinal mobilization, Cryotherapy, Moist heat, scar mobilization, Taping, and Manual therapy  Plan For Next Session: bladder diary, phy assess,    Tashanna Dolin, PT, DPT  02/05/2022, 10:37 AM

## 2022-02-07 ENCOUNTER — Ambulatory Visit: Admit: 2022-02-07 | Discharge: 2022-02-08 | Payer: MEDICARE

## 2022-02-09 ENCOUNTER — Ambulatory Visit: Admit: 2022-02-09 | Discharge: 2022-02-10 | Payer: MEDICARE

## 2022-02-09 DIAGNOSIS — M549 Dorsalgia, unspecified: Principal | ICD-10-CM

## 2022-02-09 DIAGNOSIS — M79641 Pain in right hand: Principal | ICD-10-CM

## 2022-02-09 DIAGNOSIS — M62838 Other muscle spasm: Principal | ICD-10-CM

## 2022-02-09 DIAGNOSIS — M25641 Stiffness of right hand, not elsewhere classified: Principal | ICD-10-CM

## 2022-02-13 ENCOUNTER — Ambulatory Visit: Payer: Medicaid Other

## 2022-02-16 DIAGNOSIS — Z940 Kidney transplant status: Principal | ICD-10-CM

## 2022-02-16 MED ORDER — MOLNUPIRAVIR 200 MG CAPSULE (EUA)
ORAL_CAPSULE | Freq: Two times a day (BID) | ORAL | 0 refills | 5 days | Status: CP
Start: 2022-02-16 — End: 2022-02-21

## 2022-02-20 ENCOUNTER — Ambulatory Visit: Payer: Medicaid Other

## 2022-02-20 DIAGNOSIS — Z940 Kidney transplant status: Principal | ICD-10-CM

## 2022-02-20 DIAGNOSIS — R278 Other lack of coordination: Secondary | ICD-10-CM

## 2022-02-20 DIAGNOSIS — M6289 Other specified disorders of muscle: Secondary | ICD-10-CM

## 2022-02-20 DIAGNOSIS — M6281 Muscle weakness (generalized): Secondary | ICD-10-CM

## 2022-02-20 MED ORDER — FUROSEMIDE 20 MG TABLET
ORAL_TABLET | 2 refills | 0 days | Status: CP
Start: 2022-02-20 — End: ?

## 2022-02-20 MED ORDER — PREDNISONE 2.5 MG TABLET
ORAL_TABLET | Freq: Every day | ORAL | 3 refills | 90.00000 days | Status: CP
Start: 2022-02-20 — End: 2022-02-20

## 2022-02-20 NOTE — Therapy (Signed)
OUTPATIENT PHYSICAL THERAPY FEMALE PELVIC TREATMENT`   Patient Name: Kendra Schroeder MRN: [id number] DOB:1971/02/16, 51 y.o., female Today's Date: 02/20/2022   PT End of Session - 02/20/22 0839     Visit Number 2    Number of Visits 10    Date for PT Re-Evaluation 04/16/22    Authorization Type IE: 02/05/22    PT Start Time 0840    PT Stop Time 0920    PT Time Calculation (min) 40 min    Activity Tolerance Patient tolerated treatment well             Past Medical History:  Diagnosis Date   CKD (chronic kidney disease), stage III (HCC)    Diabetes mellitus without complication (HCC)    GERD (gastroesophageal reflux disease)    Hypertension    Kidney transplant recipient    Myocardial infarct [facility])    Past Surgical History:  Procedure Laterality Date   CARPAL TUNNEL RELEASE Left    COMBINED KIDNEY-PANCREAS TRANSPLANT     EYE SURGERY     NEPHRECTOMY TRANSPLANTED ORGAN     TOE AMPUTATION     Patient Active Problem List   Diagnosis Date Noted   Atherosclerosis of native artery of left lower extremity with rest pain (HCC) 03/11/2018   Chest pain 01/25/2015   GERD (gastroesophageal reflux disease) 01/25/2015   Type 2 diabetes mellitus (HCC) 01/25/2015   HTN (hypertension) 01/25/2015   CKD (chronic kidney disease), stage III (HCC) 01/25/2015   Kidney transplant recipient 01/25/2015    PCP: Drinda Butts, MD  REFERRING PROVIDER: Garth Bigness, MD  REFERRING DIAG:  N32.81 (ICD-10-CM) - Overactive bladder  N94.89 (ICD-10-CM) - Other specified conditions associated with female genital organs and menstrual cycle  THERAPY DIAG:  Pelvic floor dysfunction  Other lack of coordination  Muscle weakness (generalized)  Rationale for Evaluation and Treatment: Rehabilitation                                                                                                                                                                                     PRECAUTIONS:  None  WEIGHT BEARING RESTRICTIONS: No  FALLS:  Has patient fallen in last 6 months? No  OCCUPATION/SOCIAL ACTIVITIES: On disability, cooking, watching movies,   PLOF: Independent  PERTINENT HISTORY/CHART REVIEW: Kidney transplant - 1998  Kidney and pancreas transplant - 2010    LIVING ENVIRONMENT: Lives with: lives with their family Lives in: House/apartment   CHIEF CONCERN: Pt had noticed earlier this year increased urinary frequency/urgency. Pt follows-up regularly each year after kidney transplant bilaterally and pancreas (Pt had about 50-60 staples from both surgeries). Pt does report some mid-back (thoracic)  pain that is intermittent. Pt describes the pain as sharp and achy. Pt also would like to address occasional constipation at times but not sure if it related to medications she is taking.     PATIENT GOALS: Pt would like to control the urinary urgency and ideally aid in constipation and having more frequency bowel movements without straining    UROLOGICAL HISTORY Fluid intake: water, occasionally sweet tea and soda, 10-12 oz   Pain with urination: No Fully empty bladder: Yes Toileting posture: feet flat Stream: Strong Urgency: Yes Frequency: 8-12x (more when having to do more with increased household chores)  Nocturia: 1x  Leakage: No but having to cross legs and rush to the bathroom Pads: Yes: just in case  Type: Pantyliner  Amount:  Bladder control (0-10): 7/10   GASTROINTESTINAL HISTORY  Pain with bowel movement: No Type of bowel movement:Type (Bristol Stool Scale) 1 and Strain No Frequency: every 2 days  Fully empty rectum: No Leakage: No   SEXUAL HISTORY/FUNCTION Pt has no concerns  Pain with intercourse:  Ability to have vaginal penetration:  ; Deep thrusting:  Able to achieve orgasm?:   OBSTETRICAL HISTORY Vaginal deliveries: G2P0 Tearing: No Currently pregnant: No  GYNECOLOGICAL HISTORY Hysterectomy: no Pelvic Organ Prolapse: None Pain  with exam: no  Heaviness/pressure: no    SUBJECTIVE:  Pt has no questions from IE.       Pain:  Are you having pain? No NPRS scale: 0/10   TODAY'S TREATMENT  Neuromuscular Re-education:  Pre-treatment assessment:    OBJECTIVE:   COGNITION: Overall cognitive status: Within functional limits for tasks assessed     POSTURE:  Lumbar lordosis:   Thoracic kyphosis: Iliac crest height: L iliac crest higher, L shoulder elevated  Lumbar lateral shift:  Pelvic obliquity: L posteriorly rotated  Leg length discrepancy: L 33 inches, R 31 inches   GAIT:  w/donning of heel lift  Distance walked: 50 ft Comments:  decreased stride length, minimal L lateral lean  Trendelenburg: + L Trendelenburg  SENSATION:  Light touch: impaired, L5-S1 dermatomes "dull, not as sensitive"    RANGE OF MOTION:   (Norm range in degrees)  LEFT 02/20/22 RIGHT 02/20/22  Lumbar forward flexion (65):  WNL    Lumbar extension (30): WNL    Lumbar lateral flexion (25):  WNL WNL  Thoracic and Lumbar rotation (30 degrees):    Restricted WNL  Hip Flexion (0-125):   WNL - some R groin pain WNL  Hip IR (0-45):  WNL - R groin WNL  Hip ER (0-45):  WNL  WNL  Hip Adduction:      Hip Abduction (0-40):  WNL WNL  Hip extension (0-15):     (*= pain, Blank rows = not tested)   STRENGTH: MMT    RLE 02/20/22 LLE 02/20/22  Hip Flexion 5 4  Hip Extension    Hip Abduction     Hip Adduction     Hip ER  5 4  Hip IR  5 4  Knee Extension 5 5  Knee Flexion 5 5  Dorsiflexion     Plantarflexion (seated) 5 5  (*= pain, Blank rows = not tested)   SPECIAL TESTS:   FABER (SN 81): negative B FADIR (SN 94): negative B    PALPATION: Deferred 2/2 time constraints  Abdominal:  Diastasis:  finger above umbilicus,  fingers at and below umbilicus  Scar mobility: present/mobile perpendicular, parallel Rib flare: present/absent  EXTERNAL PELVIC EXAM: Patient educated on the purpose  of the pelvic exam and  articulated understanding; patient consented to the exam verbally. Deferred 2/2 time constraints  Palpation: Breath coordination: present/absent/inconsistent Voluntary Contraction: present/absent Relaxation: full/delayed/non-relaxing Perineal movement with sustained IAP increase ("bear down"): descent/no change/elevation/excessive descent Perineal movement with rapid IAP increase ("cough"): elevation/no change/descent Pubic symphysis: (0= no contraction, 1= flicker, 2= weak squeeze, 3= fair squeeze with lift, 4= good squeeze and lift against resistance, 5= strong squeeze against strong resistance)   Neuromuscular Re-education: Right sidelying thoracic rotations, B x10, for improved lengthening of the anterior fascial slings   Pain modulation techniques:  Upper trap pose, 2x 15 secs  Levator trap pose, 2x 15 secs  Donning/doffing of heel lift in R shoe with 1 layer removed (M given)  Discussion on gradual wear and given another heel lift to try with the 3 layers to aid in true LLD  Patient response to interventions: Pt felt better with R heel lift    Patient Education:  Patient provided with HEP including: thoracic rotation in R sidelying, upper trap/levator scap. Patient educated throughout session on appropriate technique and form using multi-modal cueing, HEP, and activity modification. Patient will benefit from further education in order to maximize compliance and understanding for Hirth-term therapeutic gains.    ASSESSMENT:  Clinical Impression: Patient presents to clinic with excellent motivation to participate in today's session. Upon today's physical examination, Pt demonstrates deficits in posture, pain, LE strength, ROM, and gait as evidenced by true leg length discrepancy (R: 31 in, L 33 in), increased L iliac crest height with L shoulder elevation without heel lift, L innominate posteriorly rotated, decreased L thoracic rotation ROM, minimal R groin pain at R hip with hip  flexion/ER, generalized weakness of LLE with 4/5 MMT of hip flexion/ER/IR, impaired sensation of RLE at L5-S1 dermatomes (hx of R Charcot foot), +L Trendelenburg, and decreased stride length B and L lateral lean during gait analysis. Abdominal and PFM external assessment deferred 2/2 time constraints. Pt given R heel lift and observed to have a decreased L lateral lean and Pt reports feeling better with balance/gait with heel lift. Pt required moderate VCs and TCs to decrease bodily compensations during pain modulation techniques. Pt responded positively to active and educational interventions. Patient will continue to benefit from skilled therapeutic intervention to address deficits in IAP management, PFM coordination, PFM extensibility, posture, pain, ROM, LE strength, scar mobility, and gait in order to increase PLOF and improve overall QOL.    Objective Impairments: Abnormal gait, decreased coordination, decreased strength, increased fascial restrictions, improper body mechanics, postural dysfunction, and pain.   Activity Limitations: toileting and locomotion level  Personal Factors: Age, Past/current experiences, Time since onset of injury/illness/exacerbation, and 3+ comorbidities: HTN, T2DM, anemia  are also affecting patient's functional outcome.   Rehab Potential: Good  Clinical Decision Making: Evolving/moderate complexity  Evaluation Complexity: Moderate   GOALS: Goals reviewed with patient? Yes  SHORT TERM GOALS: Target date: 03/12/2022  Patient will demonstrate independence with HEP in order to maximize therapeutic gains and improve carryover from physical therapy sessions to ADLs in the home and community. Baseline: toileting posture handout given Goal status: INITIAL   [place]ch TERM GOALS: Target date: 04/16/2022   Patient will demonstrate coordinated lengthening and relaxation of PFM with diaphragmatic inhalation in order to decrease spasm and allow for unrestricted elimination  of urine/feces for improved overall QOL. Baseline: on average Type 1 BMs and having to strain  Goal status: INITIAL  2.  Patient will be able to demonstrate improved  postural alignment in standing and sitting and overall improved muscular strength in order to reduce pain and restore proper gait mechanics to aid in complete resolution of pelvic floor dysfunction.  Baseline: B rounded shoulders and will assess gait further next session, RLE  Goal status: INITIAL  3.  Patient will decrease worst pain as reported on NPRS by at least 2 points to demonstrate clinically significant reduction in pain in order to restore/improve function and overall QOL. Baseline: 10/10 L upper back/scapular region pain/has to use Salonpas every day Goal status: INITIAL  4.  Patient will report confidence in ability to control bladder > 9/10 in order to demonstrate improved function and ability to participate more fully in activities at home and in the community. Baseline: wears pantyliner "just in case" everyday and 7/10 confidence  Goal status: INITIAL  5.  Patient will score >/= 75 and 55 on FOTO Urinary Problem and Bowel Constipation in order to demonstrate improved IAP management, improved PFM coordination/PFM extensibility, and overall QOL.   Baseline: 72 and 44 Goal status: INITIAL   PLAN: PT Frequency: 1x/week  PT Duration: 10 weeks  Planned Interventions: Therapeutic exercises, Therapeutic activity, Neuromuscular re-education, Balance training, Gait training, Patient/Family education, Self Care, Joint mobilization, Spinal mobilization, Cryotherapy, Moist heat, scar mobilization, Taping, and Manual therapy  Plan For Next Session: talk about bladder diary, PFM external/abdominal exam, how did thoracic rotation go? How did heel lift go?    [employer], PT, DPT  02/20/2022, 8:39 AM

## 2022-02-27 ENCOUNTER — Ambulatory Visit
Admit: 2022-02-27 | Discharge: 2022-02-28 | Payer: MEDICARE | Attending: Student in an Organized Health Care Education/Training Program | Primary: Student in an Organized Health Care Education/Training Program

## 2022-02-27 ENCOUNTER — Ambulatory Visit: Admit: 2022-02-27 | Discharge: 2022-02-28 | Payer: MEDICARE

## 2022-02-27 DIAGNOSIS — N951 Menopausal and female climacteric states: Principal | ICD-10-CM

## 2022-02-27 DIAGNOSIS — Z8742 Personal history of other diseases of the female genital tract: Principal | ICD-10-CM

## 2022-02-28 ENCOUNTER — Ambulatory Visit: Payer: Medicare (Managed Care)

## 2022-03-06 ENCOUNTER — Ambulatory Visit
Admit: 2022-03-06 | Discharge: 2022-03-07 | Payer: MEDICARE | Attending: Orthopaedic Surgery | Primary: Orthopaedic Surgery

## 2022-03-14 ENCOUNTER — Other Ambulatory Visit: Payer: Self-pay

## 2022-03-14 ENCOUNTER — Ambulatory Visit: Payer: Medicare (Managed Care) | Attending: Pediatrics

## 2022-03-14 DIAGNOSIS — M6289 Other specified disorders of muscle: Secondary | ICD-10-CM | POA: Diagnosis present

## 2022-03-14 DIAGNOSIS — M6281 Muscle weakness (generalized): Secondary | ICD-10-CM | POA: Diagnosis present

## 2022-03-14 DIAGNOSIS — R278 Other lack of coordination: Secondary | ICD-10-CM

## 2022-03-14 NOTE — Therapy (Addendum)
OUTPATIENT PHYSICAL THERAPY FEMALE PELVIC TREATMENT   Patient Name: Kendra Schroeder MRN: [id number] DOB:27-Sep-1970, 52 y.o., female Today's Date: 03/14/2022  END OF SESSION:  PT End of Session - 03/14/22 1146     Visit Number 3    Number of Visits 10    Date for PT Re-Evaluation 04/16/22    Authorization Type IE: 02/05/22    Progress Note Due on Visit 10    PT Start Time 5686    PT Stop Time 1227    PT Time Calculation (min) 42 min    Activity Tolerance Patient tolerated treatment well    Behavior During Therapy WFL for tasks assessed/performed             Past Medical History:  Diagnosis Date   CKD (chronic kidney disease), stage III ([place])    Diabetes mellitus without complication ([place])    GERD (gastroesophageal reflux disease)    Hypertension    Kidney transplant recipient    Myocardial infarct [facility])    Past Surgical History:  Procedure Laterality Date   CARPAL TUNNEL RELEASE Left    COMBINED KIDNEY-PANCREAS TRANSPLANT     EYE SURGERY     NEPHRECTOMY TRANSPLANTED ORGAN     TOE AMPUTATION     Patient Active Problem List   Diagnosis Date Noted   Atherosclerosis of native artery of left lower extremity with rest pain ([place]) 03/11/2018   Chest pain 01/25/2015   GERD (gastroesophageal reflux disease) 01/25/2015   Type 2 diabetes mellitus ([place]) 01/25/2015   HTN (hypertension) 01/25/2015   CKD (chronic kidney disease), stage III ([place]) 01/25/2015   Kidney transplant recipient 01/25/2015  From [name]'s chart: PCP: Jolene Schimke, MD   REFERRING PROVIDER: Herbert Spires, MD   REFERRING DIAG:  N32.81 (ICD-10-CM) - Overactive bladder  N94.89 (ICD-10-CM) - Other specified conditions associated with female genital organs and menstrual cycle   THERAPY DIAG:  Pelvic floor dysfunction   Other lack of coordination   Muscle weakness (generalized)   Rationale for Evaluation and Treatment: Rehabilitation                                                                                                                                                                                       PRECAUTIONS: None   WEIGHT BEARING RESTRICTIONS: No   FALLS:  Has patient fallen in last 6 months? No   OCCUPATION/SOCIAL ACTIVITIES: On disability, cooking, watching movies,    PLOF: Independent   PERTINENT HISTORY/CHART REVIEW: Kidney transplant - 1998  Kidney and pancreas transplant - 2010      LIVING ENVIRONMENT: Lives with: lives with their family Lives in: House/apartment  CHIEF CONCERN: Pt had noticed earlier this year increased urinary frequency/urgency. Pt follows-up regularly each year after kidney transplant bilaterally and pancreas (Pt had about 50-60 staples from both surgeries). Pt does report some mid-back (thoracic) pain that is intermittent. Pt describes the pain as sharp and achy. Pt also would like to address occasional constipation at times but not sure if it related to medications she is taking.        PATIENT GOALS: Pt would like to control the urinary urgency and ideally aid in constipation and having more frequency bowel movements without straining      UROLOGICAL HISTORY Fluid intake: water, occasionally sweet tea and soda, 10-12 oz   Pain with urination: No Fully empty bladder: Yes Toileting posture: feet flat Stream: Strong Urgency: Yes Frequency: 8-12x (more when having to do more with increased household chores)  Nocturia: 1x  Leakage: No but having to cross legs and rush to the bathroom Pads: Yes: just in case  Type: Pantyliner  Amount:  Bladder control (0-10): 7/10    GASTROINTESTINAL HISTORY  Pain with bowel movement: No Type of bowel movement:Type (Bristol Stool Scale) 1 and Strain No Frequency: every 2 days  Fully empty rectum: No Leakage: No     SEXUAL HISTORY/FUNCTION Pt has no concerns  Pain with intercourse:  Ability to have vaginal penetration:  ; Deep thrusting:  Able to achieve orgasm?:    OBSTETRICAL  HISTORY Vaginal deliveries: G2P0 Tearing: No Currently pregnant: No   GYNECOLOGICAL HISTORY Hysterectomy: no Pelvic Organ Prolapse: None Pain with exam: no  Heaviness/pressure: no     03/14/22: SUBJECTIVE:  SUBJECTIVE STATEMENT: Pt reported she feels ok. She ran out of her medication on Friday but it hasn't been filled yet. It helps a little but not too much. Pt states she's been performing HEP but not daily.  Pt has incr. Frequency (urine), wakes up and drinks about 8 ounces of water upon waking, an additional 2-3 small waters throughout the day, 1 glass of soda (gingerale) at lunch and 1 glass of sweet tea at dinner. Pt voids approx. Twice in a four hour period. Pt has R heel lift donned most of the time.     PAIN:  Are you having pain? No NPRS scale: 0/10 Pain location: no pain  Pain type: n/a Pain description: n/a   Aggravating factors:  Relieving factors:             TODAY'S TREATMENT  Manual therapy: tx spine jt mobs T3-T10, grade 2-3 for pain and ROM improvements, STM to B upper trap, mid trap/rhomboids to decr. Tension.  No incr. In pain during session.  Neuromuscular Re-education: Right sidelying thoracic rotations, B x10, for improved lengthening of the anterior fascial slings and B trunk rotation. Added to medbridge.   Pain modulation techniques: seated and added to Medbridge Upper trap pose, 2x 15-30 secs  Levator trap pose, 2x 15-30 secs   Access Code: [email or web address] URL: [email or web address] Date: 03/14/2022 Prepared by: Geoffry Paradise  Exercises - Sidelying Open Book Thoracic Lumbar Rotation and Extension  - 1 x daily - 7 x weekly - 1 sets - 10 reps - 2 hold - Supine Angels  - 1 x daily - 7 x weekly - 1 sets - 10 reps - Seated Gentle Upper Trapezius Stretch  - 2-3 x daily - 7 x weekly - 1 sets - 3 reps - 15-30 hold - Gentle Levator Scapulae Stretch  - 2-3 x daily - 7 x weekly - 1 sets -  3 reps - 15-30 hold Cues and demo for proper technique. S  for safety.     OBJECTIVE:   GAIT:  w/donning of heel lift  Distance walked: 50 ft Comments:  decreased stride length, minimal L lateral lean  Trendelenburg: + L Trendelenburg        SELF CARE: Patient Education:  Patient provided with HEP including: thoracic rotation in R sidelying, upper trap/levator scap. Patient educated throughout session on appropriate technique and form using multi-modal cueing, HEP, and activity modification. PT discussed incr. Water intake and how carbonation and caffeine can irritate bladder and to limit intake or trying water before tea/soda. PT discussed starting bladder diary with pt.      ASSESSMENT:   Clinical Impression: Today's skilled session focused on HEP review and pt demonstrated progress as she met STG 1. Pt's continues to experience limited tx spine extension and incr. B Upper trap/mid trap tension, which improved after manual therapy and supine angels and open book.  Patient will continue to benefit from skilled therapeutic intervention to address deficits in IAP management, PFM coordination, PFM extensibility, posture, pain, ROM, LE strength, scar mobility, and gait in order to increase PLOF and improve overall QOL.      Objective Impairments: Abnormal gait, decreased coordination, decreased strength, increased fascial restrictions, improper body mechanics, postural dysfunction, and pain.    Activity Limitations: toileting and locomotion level   Personal Factors: Age, Past/current experiences, Time since onset of injury/illness/exacerbation, and 3+ comorbidities: HTN, T2DM, anemia  are also affecting patient's functional outcome.    Rehab Potential: Good   Clinical Decision Making: Evolving/moderate complexity   Evaluation Complexity: Moderate     GOALS: Goals reviewed with patient? Yes   SHORT TERM GOALS: Target date: 03/12/2022   Patient will demonstrate independence with HEP in order to maximize therapeutic gains and improve  carryover from physical therapy sessions to ADLs in the home and community. Baseline: toileting posture handout given Goal status: MET     [place] TERM GOALS: Target date: 04/16/2022    Patient will demonstrate coordinated lengthening and relaxation of PFM with diaphragmatic inhalation in order to decrease spasm and allow for unrestricted elimination of urine/feces for improved overall QOL. Baseline: on average Type 1 BMs and having to strain  Goal status: INITIAL   2.  Patient will be able to demonstrate improved postural alignment in standing and sitting and overall improved muscular strength in order to reduce pain and restore proper gait mechanics to aid in complete resolution of pelvic floor dysfunction.  Baseline: B rounded shoulders and will assess gait further next session, RLE  Goal status: INITIAL   3.  Patient will decrease worst pain as reported on NPRS by at least 2 points to demonstrate clinically significant reduction in pain in order to restore/improve function and overall QOL. Baseline: 10/10 L upper back/scapular region pain/has to use Salonpas every day Goal status: INITIAL   4.  Patient will report confidence in ability to control bladder > 9/10 in order to demonstrate improved function and ability to participate more fully in activities at home and in the community. Baseline: wears pantyliner "just in case" everyday and 7/10 confidence  Goal status: INITIAL   5.  Patient will score >/= 75 and 55 on FOTO Urinary Problem and Bowel Constipation in order to demonstrate improved IAP management, improved PFM coordination/PFM extensibility, and overall QOL.              Baseline: 72 and 44 Goal status: INITIAL  PLAN: PT Frequency: 1x/week   PT Duration: 10 weeks   Planned Interventions: Therapeutic exercises, Therapeutic activity, Neuromuscular re-education, Balance training, Gait training, Patient/Family education, Self Care, Joint mobilization, Spinal mobilization,  Cryotherapy, Moist heat, scar mobilization, Taping, and Manual therapy   Plan For Next Session:  bladder diary results, PFM external/abdominal exam, manual therapy prn.   Geoffry Paradise, PT,DPT 03/14/22 12:42 PM Phone: [phone] Fax: [phone]

## 2022-03-15 ENCOUNTER — Ambulatory Visit: Admit: 2022-03-15 | Discharge: 2022-03-16 | Payer: MEDICARE

## 2022-03-15 DIAGNOSIS — N951 Menopausal and female climacteric states: Principal | ICD-10-CM

## 2022-03-20 ENCOUNTER — Ambulatory Visit: Admit: 2022-03-20 | Discharge: 2022-03-21 | Payer: MEDICARE | Attending: Family Medicine | Primary: Family Medicine

## 2022-03-20 DIAGNOSIS — E88819 Insulin resistance: Principal | ICD-10-CM

## 2022-03-20 DIAGNOSIS — E1042 Type 1 diabetes mellitus with diabetic polyneuropathy: Principal | ICD-10-CM

## 2022-03-20 DIAGNOSIS — E669 Obesity, unspecified: Principal | ICD-10-CM

## 2022-03-20 DIAGNOSIS — Z6830 Body mass index (BMI) 30.0-30.9, adult: Principal | ICD-10-CM

## 2022-03-20 DIAGNOSIS — I10 Essential (primary) hypertension: Principal | ICD-10-CM

## 2022-03-20 MED ORDER — OZEMPIC 1 MG/DOSE (4 MG/3 ML) SUBCUTANEOUS PEN INJECTOR
SUBCUTANEOUS | 1 refills | 84 days | Status: CP
Start: 2022-03-20 — End: 2022-09-20

## 2022-03-21 ENCOUNTER — Other Ambulatory Visit: Payer: Self-pay

## 2022-03-21 ENCOUNTER — Ambulatory Visit: Payer: Medicare (Managed Care)

## 2022-03-21 DIAGNOSIS — M6289 Other specified disorders of muscle: Secondary | ICD-10-CM

## 2022-03-21 DIAGNOSIS — R278 Other lack of coordination: Secondary | ICD-10-CM

## 2022-03-21 DIAGNOSIS — M6281 Muscle weakness (generalized): Secondary | ICD-10-CM

## 2022-03-21 NOTE — Therapy (Signed)
OUTPATIENT PHYSICAL THERAPY FEMALE PELVIC TREATMENT   Patient Name: Kendra Schroeder MRN: [id number] DOB:Jun 12, 1970, 52 y.o., female Today's Date: 03/21/2022  END OF SESSION:  PT End of Session - 03/21/22 1451     Visit Number 4    Number of Visits 10    Date for PT Re-Evaluation 04/16/22    Authorization Type IE: 02/05/22    Progress Note Due on Visit 10    PT Start Time 1449    PT Stop Time 1062    PT Time Calculation (min) 41 min    Activity Tolerance Patient tolerated treatment well    Behavior During Therapy WFL for tasks assessed/performed             Past Medical History:  Diagnosis Date   CKD (chronic kidney disease), stage III ([place])    Diabetes mellitus without complication ([place])    GERD (gastroesophageal reflux disease)    Hypertension    Kidney transplant recipient    Myocardial infarct [facility])    Past Surgical History:  Procedure Laterality Date   CARPAL TUNNEL RELEASE Left    COMBINED KIDNEY-PANCREAS TRANSPLANT     EYE SURGERY     NEPHRECTOMY TRANSPLANTED ORGAN     TOE AMPUTATION     Patient Active Problem List   Diagnosis Date Noted   Atherosclerosis of native artery of left lower extremity with rest pain ([place]) 03/11/2018   Chest pain 01/25/2015   GERD (gastroesophageal reflux disease) 01/25/2015   Type 2 diabetes mellitus ([place]) 01/25/2015   HTN (hypertension) 01/25/2015   CKD (chronic kidney disease), stage III ([place]) 01/25/2015   Kidney transplant recipient 01/25/2015  From [name]'s chart: PCP: Jolene Schimke, MD   REFERRING PROVIDER: Herbert Spires, MD   REFERRING DIAG:  N32.81 (ICD-10-CM) - Overactive bladder  N94.89 (ICD-10-CM) - Other specified conditions associated with female genital organs and menstrual cycle   THERAPY DIAG:  Pelvic floor dysfunction   Other lack of coordination   Muscle weakness (generalized)   Rationale for Evaluation and Treatment: Rehabilitation                                                                                                                                                                                       PRECAUTIONS: None   WEIGHT BEARING RESTRICTIONS: No   FALLS:  Has patient fallen in last 6 months? No   OCCUPATION/SOCIAL ACTIVITIES: On disability, cooking, watching movies,    PLOF: Independent   PERTINENT HISTORY/CHART REVIEW: Kidney transplant - 1998  Kidney and pancreas transplant - 2010      LIVING ENVIRONMENT: Lives with: lives with their family Lives in: House/apartment  CHIEF CONCERN: Pt had noticed earlier this year increased urinary frequency/urgency. Pt follows-up regularly each year after kidney transplant bilaterally and pancreas (Pt had about 50-60 staples from both surgeries). Pt does report some mid-back (thoracic) pain that is intermittent. Pt describes the pain as sharp and achy. Pt also would like to address occasional constipation at times but not sure if it related to medications she is taking.        PATIENT GOALS: Pt would like to control the urinary urgency and ideally aid in constipation and having more frequency bowel movements without straining     03/21/22: SUBJECTIVE:  SUBJECTIVE STATEMENT: Pt reported she's been feeling ok since last visit.   PAIN:  Are you having pain? Yes NPRS scale: 5/10 Pain location: RLE (tender around medial ankle) she goes to her foot doctor this week and will communicate this to them.  Pain type: sharp, intermittent  Pain description: cramp  Aggravating factors: unsure Relieving factors: rest       TODAY'S TREATMENT  Manual therapy: scar mobilization of vertical abdominal scar. Incr. Adhesions noted in first inch of top of scar (sup. To umbilicus) and latter half of bottom of scar (inf. To umbilicus).  Neuromuscular Re-education: Access Code: [email or web address] URL: [email or web address] Date: 03/14/2022 Prepared by: Geoffry Paradise  Exercises - Sidelying Open Book Thoracic Lumbar  Rotation and Extension  - 1 x daily - 7 x weekly - 1 sets - 10 reps - 2 hold - Supine Angels  - 1 x daily - 7 x weekly - 1 sets - 10 reps -review levator and UT stretch only. Cues and demo for proper technique, especially during supine angels (elbow flexion and arms in contact with table surface to maximize tx spine ext). S for safety.     GAIT:  no heel lift donned today, incr. L lateral lean and decr. Step and stride length noted. Distance walked: 75 ft        SELF CARE: Patient Education:  PT reviewed HEP with pt and educated pt on pelvic tilts (not added to HEP yet). PT added scar mobilization to HEP and educated pt on benefits.  Printed out HEP and info.   ASSESSMENT:   Clinical Impression: Today's skilled session focused on HEP review and scar mobilization to decr. Adhesions and improve pt's trunk rotation and ext. Pt demonstrated progress as she was able to perform HEP with minimal cues. Pt continues to experience  deficits in IAP management, PFM coordination, PFM extensibility, posture, pain, ROM, LE strength, scar mobility, and gait. Therefore she would benefit from skilled PT to incr. PLOF and improve overall QOL.     Objective Impairments: Abnormal gait, decreased coordination, decreased strength, increased fascial restrictions, improper body mechanics, postural dysfunction, and pain.    Activity Limitations: toileting and locomotion level   Personal Factors: Age, Past/current experiences, Time since onset of injury/illness/exacerbation, and 3+ comorbidities: HTN, T2DM, anemia  are also affecting patient's functional outcome.    Rehab Potential: Good   Clinical Decision Making: Evolving/moderate complexity   Evaluation Complexity: Moderate     GOALS: Goals reviewed with patient? Yes   SHORT TERM GOALS: Target date: 03/12/2022   Patient will demonstrate independence with HEP in order to maximize therapeutic gains and improve carryover from physical therapy sessions to  ADLs in the home and community. Baseline: toileting posture handout given Goal status: MET     [place] TERM GOALS: Target date: 04/16/2022    Patient will demonstrate coordinated lengthening and relaxation of PFM with  diaphragmatic inhalation in order to decrease spasm and allow for unrestricted elimination of urine/feces for improved overall QOL. Baseline: on average Type 1 BMs and having to strain  Goal status: INITIAL   2.  Patient will be able to demonstrate improved postural alignment in standing and sitting and overall improved muscular strength in order to reduce pain and restore proper gait mechanics to aid in complete resolution of pelvic floor dysfunction.  Baseline: B rounded shoulders and will assess gait further next session, RLE  Goal status: INITIAL   3.  Patient will decrease worst pain as reported on NPRS by at least 2 points to demonstrate clinically significant reduction in pain in order to restore/improve function and overall QOL. Baseline: 10/10 L upper back/scapular region pain/has to use Salonpas every day Goal status: INITIAL   4.  Patient will report confidence in ability to control bladder > 9/10 in order to demonstrate improved function and ability to participate more fully in activities at home and in the community. Baseline: wears pantyliner "just in case" everyday and 7/10 confidence  Goal status: INITIAL   5.  Patient will score >/= 75 and 55 on FOTO Urinary Problem and Bowel Constipation in order to demonstrate improved IAP management, improved PFM coordination/PFM extensibility, and overall QOL.              Baseline: 72 and 44 Goal status: INITIAL     PLAN: PT Frequency: 1x/week   PT Duration: 10 weeks   Planned Interventions: Therapeutic exercises, Therapeutic activity, Neuromuscular re-education, Balance training, Gait training, Patient/Family education, Self Care, Joint mobilization, Spinal mobilization, Cryotherapy, Moist heat, scar mobilization,  Taping, and Manual therapy   Plan For Next Session:  pelvic tilts and deep core, manual therapy prn.   Zerita Boers, PT,DPT 03/21/22 2:51 PM Phone: [phone] Fax: [phone]

## 2022-03-22 ENCOUNTER — Ambulatory Visit: Admit: 2022-03-22 | Discharge: 2022-03-23 | Payer: MEDICARE

## 2022-03-22 DIAGNOSIS — M7989 Other specified soft tissue disorders: Principal | ICD-10-CM

## 2022-03-23 ENCOUNTER — Institutional Professional Consult (permissible substitution): Admit: 2022-03-23 | Discharge: 2022-03-24 | Payer: MEDICARE

## 2022-03-23 DIAGNOSIS — Z940 Kidney transplant status: Principal | ICD-10-CM

## 2022-03-23 DIAGNOSIS — Z9483 Pancreas transplant status: Principal | ICD-10-CM

## 2022-03-23 DIAGNOSIS — Z79899 Other long term (current) drug therapy: Principal | ICD-10-CM

## 2022-03-27 ENCOUNTER — Ambulatory Visit
Admit: 2022-03-27 | Discharge: 2022-03-28 | Payer: MEDICARE | Attending: Student in an Organized Health Care Education/Training Program | Primary: Student in an Organized Health Care Education/Training Program

## 2022-03-27 ENCOUNTER — Other Ambulatory Visit: Payer: Self-pay

## 2022-03-27 ENCOUNTER — Ambulatory Visit: Payer: Medicare (Managed Care)

## 2022-03-27 DIAGNOSIS — T8332XA Displacement of intrauterine contraceptive device, initial encounter: Principal | ICD-10-CM

## 2022-03-27 DIAGNOSIS — Z8742 Personal history of other diseases of the female genital tract: Principal | ICD-10-CM

## 2022-03-27 NOTE — Therapy (Addendum)
OUTPATIENT PHYSICAL THERAPY FEMALE PELVIC TREATMENT   Patient Name: Kendra Schroeder MRN: [id number] DOB:Jul 10, 1970, 52 y.o., female Today's Date: 03/27/2022  END OF SESSION:  PT End of Session - 03/27/22 1050     Visit Number 4   no charge   Number of Visits 10    Date for PT Re-Evaluation 04/16/22    Authorization Type IE: 02/05/22    Progress Note Due on Visit 10    PT Start Time 1048   no charge   PT Stop Time 1103    PT Time Calculation (min) 15 min    Activity Tolerance --    Behavior During Therapy --             Past Medical History:  Diagnosis Date   CKD (chronic kidney disease), stage III ([place])    Diabetes mellitus without complication ([place])    GERD (gastroesophageal reflux disease)    Hypertension    Kidney transplant recipient    Myocardial infarct [facility])    Past Surgical History:  Procedure Laterality Date   CARPAL TUNNEL RELEASE Left    COMBINED KIDNEY-PANCREAS TRANSPLANT     EYE SURGERY     NEPHRECTOMY TRANSPLANTED ORGAN     TOE AMPUTATION     Patient Active Problem List   Diagnosis Date Noted   Atherosclerosis of native artery of left lower extremity with rest pain ([place]) 03/11/2018   Chest pain 01/25/2015   GERD (gastroesophageal reflux disease) 01/25/2015   Type 2 diabetes mellitus ([place]) 01/25/2015   HTN (hypertension) 01/25/2015   CKD (chronic kidney disease), stage III ([place]) 01/25/2015   Kidney transplant recipient 01/25/2015  From [name]'s chart: PCP: Jolene Schimke, MD   REFERRING PROVIDER: Herbert Spires, MD   REFERRING DIAG:  N32.81 (ICD-10-CM) - Overactive bladder  N94.89 (ICD-10-CM) - Other specified conditions associated with female genital organs and menstrual cycle   THERAPY DIAG:  Pelvic floor dysfunction   Other lack of coordination   Muscle weakness (generalized)   Rationale for Evaluation and Treatment: Rehabilitation                                                                                                                                                                                       PRECAUTIONS: None   WEIGHT BEARING RESTRICTIONS: No   FALLS:  Has patient fallen in last 6 months? No   OCCUPATION/SOCIAL ACTIVITIES: On disability, cooking, watching movies,    PLOF: Independent   PERTINENT HISTORY/CHART REVIEW: Kidney transplant - 1998  Kidney and pancreas transplant - 2010      LIVING ENVIRONMENT: Lives with: lives with their family Lives in: House/apartment  CHIEF CONCERN: Pt had noticed earlier this year increased urinary frequency/urgency. Pt follows-up regularly each year after kidney transplant bilaterally and pancreas (Pt had about 50-60 staples from both surgeries). Pt does report some mid-back (thoracic) pain that is intermittent. Pt describes the pain as sharp and achy. Pt also would like to address occasional constipation at times but not sure if it related to medications she is taking.        PATIENT GOALS: Pt would like to control the urinary urgency and ideally aid in constipation and having more frequency bowel movements without straining     03/27/22: SUBJECTIVE:  SUBJECTIVE STATEMENT: Pt reported she feels ok. She didn't have a chance to try scar mobilization as she was very busy with appt's last week. Pt did perform HEP intermittently last week.  Pt did report Dr. Serita Grit office is checking for a blot clot in RLE 2/2 signs and symptoms. PT unable to load [facility] note. PT canceled appt and educated pt on calling Dr. Serita Grit office for testing results and that PT will hold until medically cleared.  PAIN:  Are you having pain? No NPRS scale: 0/10 Pain location:  Pain type:   Pain description:   Aggravating factors:  Relieving factors:     NO CHARGE.     Geoffry Paradise, PT,DPT 03/27/22 11:08 AM Phone: [phone] Fax: [phone]

## 2022-04-03 MED ORDER — LISINOPRIL 10 MG TABLET
ORAL_TABLET | Freq: Every day | ORAL | 3 refills | 90 days | Status: CP
Start: 2022-04-03 — End: 2023-04-03

## 2022-04-03 MED ORDER — CARVEDILOL 25 MG TABLET
ORAL_TABLET | Freq: Two times a day (BID) | ORAL | 3 refills | 90 days | Status: CP
Start: 2022-04-03 — End: ?

## 2022-04-05 ENCOUNTER — Ambulatory Visit: Admit: 2022-04-05 | Discharge: 2022-04-06 | Payer: MEDICARE

## 2022-04-08 MED ORDER — ONDANSETRON 4 MG DISINTEGRATING TABLET
ORAL_TABLET | Freq: Three times a day (TID) | ORAL | 0 refills | 3 days | PRN
Start: 2022-04-08 — End: 2022-04-11

## 2022-04-08 MED ORDER — HYDROCODONE 5 MG-ACETAMINOPHEN 325 MG TABLET
ORAL_TABLET | Freq: Four times a day (QID) | ORAL | 0 refills | 4 days | PRN
Start: 2022-04-08 — End: 2022-04-13

## 2022-04-08 MED ORDER — GABAPENTIN 100 MG CAPSULE
ORAL_CAPSULE | Freq: Three times a day (TID) | ORAL | 0 refills | 3 days
Start: 2022-04-08 — End: 2022-04-11

## 2022-04-08 MED ORDER — POLYETHYLENE GLYCOL 3350 17 GRAM ORAL POWDER PACKET
PACK | Freq: Every day | ORAL | 0 refills | 14 days
Start: 2022-04-08 — End: 2022-04-22

## 2022-04-09 DIAGNOSIS — M79671 Pain in right foot: Principal | ICD-10-CM

## 2022-04-10 ENCOUNTER — Ambulatory Visit: Admit: 2022-04-10 | Discharge: 2022-04-11 | Payer: MEDICARE

## 2022-04-11 ENCOUNTER — Encounter: Admit: 2022-04-11 | Discharge: 2022-04-11 | Payer: MEDICARE

## 2022-04-11 ENCOUNTER — Ambulatory Visit: Admit: 2022-04-11 | Discharge: 2022-04-11 | Payer: MEDICARE

## 2022-04-11 MED ORDER — POLYETHYLENE GLYCOL 3350 17 GRAM ORAL POWDER PACKET
PACK | Freq: Every day | ORAL | 0 refills | 14 days | Status: CP
Start: 2022-04-11 — End: 2022-04-25

## 2022-04-11 MED ORDER — ONDANSETRON 4 MG DISINTEGRATING TABLET
ORAL_TABLET | Freq: Three times a day (TID) | ORAL | 0 refills | 3 days | Status: CP | PRN
Start: 2022-04-11 — End: 2022-04-14

## 2022-04-11 MED ORDER — HYDROCODONE 5 MG-ACETAMINOPHEN 325 MG TABLET
ORAL_TABLET | Freq: Four times a day (QID) | ORAL | 0 refills | 4 days | Status: CP | PRN
Start: 2022-04-11 — End: 2022-04-16

## 2022-04-17 ENCOUNTER — Other Ambulatory Visit: Payer: Self-pay

## 2022-04-17 ENCOUNTER — Ambulatory Visit: Payer: Medicare (Managed Care) | Attending: Pediatrics

## 2022-04-17 DIAGNOSIS — M6289 Other specified disorders of muscle: Secondary | ICD-10-CM | POA: Diagnosis present

## 2022-04-17 DIAGNOSIS — M6281 Muscle weakness (generalized): Secondary | ICD-10-CM | POA: Diagnosis present

## 2022-04-17 DIAGNOSIS — R278 Other lack of coordination: Secondary | ICD-10-CM

## 2022-04-17 NOTE — Therapy (Signed)
OUTPATIENT PHYSICAL THERAPY FEMALE PELVIC TREATMENT/recert     Patient Name: Kendra Schroeder MRN: [id number] DOB:03/28/1970, 52 y.o., female Today's Date: 04/17/2022   END OF SESSION:   PT End of Session - 04/17/22 1022       Visit Number 5     Number of Visits 10     Date for PT Re-Evaluation 04/16/22     Authorization Type IE: 02/05/22     Progress Note Due on Visit 10     PT Start Time 1018     PT Stop Time 1058     PT Time Calculation (min) 40 min     Activity Tolerance Patient tolerated treatment well     Behavior During Therapy WFL for tasks assessed/performed                       Past Medical History:  Diagnosis Date   CKD (chronic kidney disease), stage III ([facility])     Diabetes mellitus without complication ([place])     GERD (gastroesophageal reflux disease)     Hypertension     Kidney transplant recipient     Myocardial infarct [facility])           Past Surgical History:  Procedure Laterality Date   CARPAL TUNNEL RELEASE Left     COMBINED KIDNEY-PANCREAS TRANSPLANT       EYE SURGERY       NEPHRECTOMY TRANSPLANTED ORGAN       TOE AMPUTATION            Patient Active Problem List    Diagnosis Date Noted   Atherosclerosis of native artery of left lower extremity with rest pain ([place]) 03/11/2018   Chest pain 01/25/2015   GERD (gastroesophageal reflux disease) 01/25/2015   Type 2 diabetes mellitus ([place]) 01/25/2015   HTN (hypertension) 01/25/2015   CKD (chronic kidney disease), stage III ([place]) 01/25/2015   Kidney transplant recipient 01/25/2015  From [name]'s chart: PCP: Jolene Schimke, MD   REFERRING PROVIDER: Herbert Spires, MD   REFERRING DIAG:  N32.81 (ICD-10-CM) - Overactive bladder  N94.89 (ICD-10-CM) - Other specified conditions associated with female genital organs and menstrual cycle   THERAPY DIAG:  Pelvic floor dysfunction   Other lack of coordination   Muscle weakness (generalized)   Rationale for Evaluation and Treatment: Rehabilitation                                                                                                                                                                                       PRECAUTIONS: None   WEIGHT BEARING RESTRICTIONS: No   FALLS:  Has patient fallen  in last 6 months? No   OCCUPATION/SOCIAL ACTIVITIES: On disability, cooking, watching movies,    PLOF: Independent   PERTINENT HISTORY/CHART REVIEW: Kidney transplant - 1998  Kidney and pancreas transplant - 2010      LIVING ENVIRONMENT: Lives with: lives with their family Lives in: House/apartment     CHIEF CONCERN: Pt had noticed earlier this year increased urinary frequency/urgency. Pt follows-up regularly each year after kidney transplant bilaterally and pancreas (Pt had about 50-60 staples from both surgeries). Pt does report some mid-back (thoracic) pain that is intermittent. Pt describes the pain as sharp and achy. Pt also would like to address occasional constipation at times but not sure if it related to medications she is taking.        PATIENT GOALS: Pt would like to control the urinary urgency and ideally aid in constipation and having more frequency bowel movements without straining     04/17/22: SUBJECTIVE:  SUBJECTIVE STATEMENT: Pt reported she had R hand tendon sheath repair last week and healing is going well. She feels 75-80% improvement since starting PHPT. Her mom has a mass and will likely need treatment in the coming months, therefore, pt may need to change frequency. Pt does not have heel lift donned as R foot is still painful, pt reported no blood clots per MD and is awaiting x-ray results to assess distal RLE/foot rod.      PAIN:  Are you having pain? No NPRS scale: 0/10 Pain location: not present   Pain type:  Pain description:    Aggravating factors:  Relieving factors:        TODAY'S TREATMENT    Neuromuscular Re-education: Access Code: [email or web address] URL:  [email or web address] Date: 03/14/2022 Prepared by: Geoffry Paradise   Exercises: review only except for pelvic tilts. - Sidelying Open Book Thoracic Lumbar Rotation and Extension  - 1 x daily - 7 x weekly - 1 sets - 10 reps - 2 hold - Supine Angels  - 1 x daily - 7 x weekly - 1 sets - 10 reps -review levator and UT stretch only. -Seated and standing pelvic tilts (ant/post) x10 reps. Cues and demo for proper technique. Pt not able to perform correctly in standing quite yet, so only seated provided for HEP S for safety.     GAIT:  again no heel lift donned today 2/2 R foot pain, incr. L lateral lean and decr. Step and stride length noted. Distance walked: 75 ft        SELF CARE: Patient Education:  PT reviewed LTGs extensively and discussed goal progress, PT POC and frequency based on goal progress. PT reviewed posture, discussed confidence (pad level will be reported next time), coordination of PFM. PT added pelvic tilts to HEP. Printed out HEP and info.   ASSESSMENT:   Clinical Impression: Today's skilled session focused on assessing LTGs, reviewing HEP and adding pelvic tilts to HEP. Pt required cues in standing and unable to perform pelvic tilts correctly but progressed to no cues or demo when performing in seated.  Pt continues to experience deficits in IAP management, PFM coordination, PFM extensibility, posture, pain, ROM, LE strength, scar mobility, and gait. Therefore she would benefit from skilled PT to incr. PLOF and improve overall QOL. PT requesting additional 4 visits over an 8 week period to continue towards unmet LTGs and to accommodate pt's caregiver schedule.     Objective Impairments: Abnormal gait, decreased coordination, decreased strength, increased fascial restrictions, improper body mechanics, postural dysfunction, and pain.  Activity Limitations: toileting and locomotion level   Personal Factors: Age, Past/current experiences, Time since onset of  injury/illness/exacerbation, and 3+ comorbidities: HTN, T2DM, anemia  are also affecting patient's functional outcome.    Rehab Potential: Good   Clinical Decision Making: Evolving/moderate complexity   Evaluation Complexity: Moderate     GOALS: Goals reviewed with patient? Yes   SHORT TERM GOALS: Target date: 03/12/2022   Patient will demonstrate independence with HEP in order to maximize therapeutic gains and improve carryover from physical therapy sessions to ADLs in the home and community. Baseline: toileting posture handout given Goal status: MET     [place] TERM GOALS: Target date: 04/16/2022    Patient will demonstrate coordinated lengthening and relaxation of PFM with diaphragmatic inhalation in order to decrease spasm and allow for unrestricted elimination of urine/feces for improved overall QOL. Baseline: on average Type 1 BMs and having to strain. 04/17/22: Type 2 75% of the time with intermittent types 3 and 4.  Goal status: PARTIALLY MET    2.  Patient will be able to demonstrate improved postural alignment in standing and sitting and overall improved muscular strength in order to reduce pain and restore proper gait mechanics to aid in complete resolution of pelvic floor dysfunction.  Baseline: B rounded shoulders and will assess gait further next session, RLE. 04/17/22: R foot pain causes antalgic gait right now, but pt B rounded shoulders improved, and sits in figure four vs. Knees/ankles crossed. Goal status: PARTIALLY MET   3.  Patient will decrease worst pain as reported on NPRS by at least 2 points to demonstrate clinically significant reduction in pain in order to restore/improve function and overall QOL. Baseline: 10/10 L upper back/scapular region pain/has to use Salonpas every day. 04/17/22: at worst still 10/10 (was daily but not 2-3x/week) but on normal day 6-7/10 and frequency of severe pain is reduced.   Goal status: PARTIALLY MET   4.  Patient will report  confidence in ability to control bladder > 9/10 in order to demonstrate improved function and ability to participate more fully in activities at home and in the community. Baseline: wears pantyliner "just in case" everyday and 7/10 confidence. 04/17/22: 9/10 confidence and wears a poise pantyliner daily (she thinks its Lvl 2). Goal status: PARTIALLY MET   5.  Patient will score >/= 75 and 55 on FOTO Urinary Problem and Bowel Constipation in order to demonstrate improved IAP management, improved PFM coordination/PFM extensibility, and overall QOL.              Baseline: 72 and 44 Goal status: INITIAL (DEFERRED)     PLAN: PT Frequency: 1x/week   PT Duration: 10 weeks   Planned Interventions: Therapeutic exercises, Therapeutic activity, Neuromuscular re-education, Balance training, Gait training, Patient/Family education, Self Care, Joint mobilization, Spinal mobilization, Cryotherapy, Moist heat, scar mobilization, Taping, and Manual therapy   Plan For Next Session:  pelvic tilts and deep core, manual therapy prn.   Geoffry Paradise, PT,DPT 04/17/22 10:32 AM Phone: [phone] Fax: [phone]

## 2022-04-19 ENCOUNTER — Institutional Professional Consult (permissible substitution): Admit: 2022-04-19 | Discharge: 2022-04-20 | Payer: MEDICARE

## 2022-04-19 DIAGNOSIS — Z940 Kidney transplant status: Principal | ICD-10-CM

## 2022-04-19 DIAGNOSIS — Z9483 Pancreas transplant status: Principal | ICD-10-CM

## 2022-04-19 DIAGNOSIS — Z79899 Other long term (current) drug therapy: Principal | ICD-10-CM

## 2022-04-19 DIAGNOSIS — D849 Immunodeficiency, unspecified: Principal | ICD-10-CM

## 2022-04-19 MED ORDER — SODIUM BICARBONATE 650 MG TABLET
ORAL_TABLET | Freq: Three times a day (TID) | ORAL | 11 refills | 30 days | Status: CP
Start: 2022-04-19 — End: ?

## 2022-04-24 ENCOUNTER — Ambulatory Visit
Admit: 2022-04-24 | Discharge: 2022-04-25 | Payer: MEDICARE | Attending: Orthopaedic Surgery | Primary: Orthopaedic Surgery

## 2022-04-24 ENCOUNTER — Ambulatory Visit: Payer: Medicare (Managed Care)

## 2022-04-27 ENCOUNTER — Telehealth: Admit: 2022-04-27 | Payer: MEDICARE

## 2022-04-27 MED ORDER — MIRABEGRON ER 25 MG TABLET,EXTENDED RELEASE 24 HR
ORAL_TABLET | Freq: Every day | ORAL | 11 refills | 30 days | Status: CP
Start: 2022-04-27 — End: ?

## 2022-05-01 ENCOUNTER — Other Ambulatory Visit: Payer: Self-pay

## 2022-05-01 ENCOUNTER — Ambulatory Visit: Payer: Medicare (Managed Care) | Attending: Pediatrics

## 2022-05-01 DIAGNOSIS — R278 Other lack of coordination: Secondary | ICD-10-CM | POA: Diagnosis present

## 2022-05-01 DIAGNOSIS — M6281 Muscle weakness (generalized): Secondary | ICD-10-CM | POA: Diagnosis present

## 2022-05-01 DIAGNOSIS — M6289 Other specified disorders of muscle: Secondary | ICD-10-CM | POA: Diagnosis present

## 2022-05-01 NOTE — Therapy (Addendum)
OUTPATIENT PHYSICAL THERAPY FEMALE PELVIC TREATMENT     Patient Name: Kendra Schroeder MRN: [id number] DOB:09-14-1970, 52 y.o., female Today's Date: 04/17/2022   END OF SESSION:   PT End of Session - 04/17/22 1022       Visit Number 6     Number of Visits 10     Date for PT Re-Evaluation 04/16/22     Authorization Type IE: 02/05/22     Progress Note Due on Visit 10     PT Start Time 1017    PT Stop Time 1058     PT Time Calculation (min) 41 min     Activity Tolerance Patient tolerated treatment well     Behavior During Therapy WFL for tasks assessed/performed                       Past Medical History:  Diagnosis Date   CKD (chronic kidney disease), stage III ([place])     Diabetes mellitus without complication ([place])     GERD (gastroesophageal reflux disease)     Hypertension     Kidney transplant recipient     Myocardial infarct [facility])           Past Surgical History:  Procedure Laterality Date   CARPAL TUNNEL RELEASE Left     COMBINED KIDNEY-PANCREAS TRANSPLANT       EYE SURGERY       NEPHRECTOMY TRANSPLANTED ORGAN       TOE AMPUTATION            Patient Active Problem List    Diagnosis Date Noted   Atherosclerosis of native artery of left lower extremity with rest pain ([place]) 03/11/2018   Chest pain 01/25/2015   GERD (gastroesophageal reflux disease) 01/25/2015   Type 2 diabetes mellitus ([place]) 01/25/2015   HTN (hypertension) 01/25/2015   CKD (chronic kidney disease), stage III ([place]) 01/25/2015   Kidney transplant recipient 01/25/2015  From [name]'s chart: PCP: Jolene Schimke, MD   REFERRING PROVIDER: Herbert Spires, MD   REFERRING DIAG:  N32.81 (ICD-10-CM) - Overactive bladder  N94.89 (ICD-10-CM) - Other specified conditions associated with female genital organs and menstrual cycle   THERAPY DIAG:  Pelvic floor dysfunction   Other lack of coordination   Muscle weakness (generalized)   Rationale for Evaluation and Treatment: Rehabilitation                                                                                                                                                                                       PRECAUTIONS: None   WEIGHT BEARING RESTRICTIONS: No   FALLS:  Has patient fallen in  last 6 months? No   OCCUPATION/SOCIAL ACTIVITIES: On disability, cooking, watching movies,    PLOF: Independent   PERTINENT HISTORY/CHART REVIEW: Kidney transplant - 1998  Kidney and pancreas transplant - 2010      LIVING ENVIRONMENT: Lives with: lives with their family Lives in: House/apartment     CHIEF CONCERN: Pt had noticed earlier this year increased urinary frequency/urgency. Pt follows-up regularly each year after kidney transplant bilaterally and pancreas (Pt had about 50-60 staples from both surgeries). Pt does report some mid-back (thoracic) pain that is intermittent. Pt describes the pain as sharp and achy. Pt also would like to address occasional constipation at times but not sure if it related to medications she is taking.        PATIENT GOALS: Pt would like to control the urinary urgency and ideally aid in constipation and having more frequency bowel movements without straining     04/17/22: SUBJECTIVE:  SUBJECTIVE STATEMENT: Pt reported she had R hand tendon sheath repair last week and healing is still going well. Pt reports no constipation. Pt reported she hasn't needed a stool softener, reduce straining. Pt urinary urgency very much improved as she's able to cook without running back and forth to the bathroom (on average three hours).  Pt's mom has cancer, so she is going to have to take care of her during treatment. Pt has been performing exercises intermittently. Pt reported her back pain has incr. The last week of so, she thinks it could be due to weight gain.   PAIN:  Are you having pain? Yes NPRS scale: 7/10 Pain location: above eyes and R posterior occipital    Pain type: headache Pain description: ache  with ear pain   Aggravating factors: activity  Relieving factors: rest       TODAY'S TREATMENT    Neuromuscular Re-education: Access Code: [email or web address]  URL: [email or web address] Date: 05/01/2022 Prepared by: Geoffry Paradise  Exercises - Supine Pelvic Tilt  - 1 x daily - 7 x weekly - 1 sets - 5-10 reps - Supine Diaphragmatic Breathing  - 1 x daily - 7 x weekly - 1 sets - 5 reps, also performed mindfulness techniques, guided body scan, focusing on a word, and low pitch sounds to relax jaw and PFM.  - Dead Bug  - 1 x daily - 3 x weekly - 4 sets - 5 reps. Trialed variations with UE/contra. LE and then just UEs and then LEs 2/2 difficulty with coordination.       GAIT:  again no heel lift donned today 2/2 R foot pain, incr. L lateral lean and decr. Step and stride length noted. Distance walked: 75 ft and 50'. No incr. In pain after NMR.        SELF CARE: Patient Education:  PT reviewed and progressed HEP as tolerated. PT discussed the importance of 360 degree diaphragmatic breathing and how to incorporate throughout the day to decr. Tension overall, which can impact PFM tension. PT discussed progress and seeing pt once more prior to her mom's cancer treatments to ensure pt's urinary urgency and constipation/straining is under control. Printed out HEP and info.   ASSESSMENT:   Clinical Impression: Today's skilled session focused progressing HEP and prepping pt for early d/c based on pt's request as she will be her mother's primary caregiver 2/2 mom dx'd with CA. Pt demonstrated progress as urinary urgency, constipation and straining has improved since starting PHPT. However, pt continues to experience deficits in IAP management, PFM coordination, PFM extensibility,  posture, pain, ROM, LE strength, scar mobility, and gait. Pt would continue to benefit from skilled PT to improve deficits listed above during all ADLs.     Objective Impairments: Abnormal gait, decreased  coordination, decreased strength, increased fascial restrictions, improper body mechanics, postural dysfunction, and pain.    Activity Limitations: toileting and locomotion level   Personal Factors: Age, Past/current experiences, Time since onset of injury/illness/exacerbation, and 3+ comorbidities: HTN, T2DM, anemia  are also affecting patient's functional outcome.    Rehab Potential: Good   Clinical Decision Making: Evolving/moderate complexity   Evaluation Complexity: Moderate     GOALS: Goals reviewed with patient? Yes   SHORT TERM GOALS: Target date: 03/12/2022   Patient will demonstrate independence with HEP in order to maximize therapeutic gains and improve carryover from physical therapy sessions to ADLs in the home and community. Baseline: toileting posture handout given Goal status: MET     [place] TERM GOALS: Target date: 04/16/2022. All unmet LTGs carried forward to new POC: 06/16/22.   Patient will demonstrate coordinated lengthening and relaxation of PFM with diaphragmatic inhalation in order to decrease spasm and allow for unrestricted elimination of urine/feces for improved overall QOL. Baseline: on average Type 1 BMs and having to strain. 04/17/22: Type 2 75% of the time with intermittent types 3 and 4.  Goal status: PARTIALLY MET    2.  Patient will be able to demonstrate improved postural alignment in standing and sitting and overall improved muscular strength in order to reduce pain and restore proper gait mechanics to aid in complete resolution of pelvic floor dysfunction.  Baseline: B rounded shoulders and will assess gait further next session, RLE. 04/17/22: R foot pain causes antalgic gait right now, but pt B rounded shoulders improved, and sits in figure four vs. Knees/ankles crossed. Goal status: PARTIALLY MET   3.  Patient will decrease worst pain as reported on NPRS by at least 2 points to demonstrate clinically significant reduction in pain in order to  restore/improve function and overall QOL. Baseline: 10/10 L upper back/scapular region pain/has to use Salonpas every day. 04/17/22: at worst still 10/10 (was daily but not 2-3x/week) but on normal day 6-7/10 and frequency of severe pain is reduced.   Goal status: PARTIALLY MET   4.  Patient will report confidence in ability to control bladder > 9/10 in order to demonstrate improved function and ability to participate more fully in activities at home and in the community. Baseline: wears pantyliner "just in case" everyday and 7/10 confidence. 04/17/22: 9/10 confidence and wears a poise pantyliner daily (she thinks its Lvl 2). Goal status: PARTIALLY MET   5.  Patient will score >/= 75 and 55 on FOTO Urinary Problem and Bowel Constipation in order to demonstrate improved IAP management, improved PFM coordination/PFM extensibility, and overall QOL.              Baseline: 72 and 44 Goal status: INITIAL (DEFERRED)     PLAN: PT Frequency: 1x/week   PT Duration: 10 weeks   Planned Interventions: Therapeutic exercises, Therapeutic activity, Neuromuscular re-education, Balance training, Gait training, Patient/Family education, Self Care, Joint mobilization, Spinal mobilization, Cryotherapy, Moist heat, scar mobilization, Taping, and Manual therapy   Plan For Next Session:  check LTGs and d/c.   Geoffry Paradise, PT,DPT 04/17/22 10:32 AM Phone: [phone] Fax: [phone]

## 2022-05-10 ENCOUNTER — Ambulatory Visit: Admit: 2022-05-10 | Discharge: 2022-05-10 | Payer: MEDICARE

## 2022-05-10 DIAGNOSIS — Z1231 Encounter for screening mammogram for malignant neoplasm of breast: Principal | ICD-10-CM

## 2022-05-15 ENCOUNTER — Ambulatory Visit: Payer: Medicare (Managed Care)

## 2022-05-15 DIAGNOSIS — D631 Anemia in chronic kidney disease: Principal | ICD-10-CM

## 2022-05-15 DIAGNOSIS — Z79899 Other long term (current) drug therapy: Principal | ICD-10-CM

## 2022-05-15 DIAGNOSIS — N1832 Anemia of chronic renal failure, stage 3b (CMS-HCC): Principal | ICD-10-CM

## 2022-05-15 DIAGNOSIS — Z940 Kidney transplant status: Principal | ICD-10-CM

## 2022-05-16 ENCOUNTER — Ambulatory Visit: Admit: 2022-05-16 | Discharge: 2022-05-17 | Payer: MEDICARE

## 2022-05-16 DIAGNOSIS — R87612 Low grade squamous intraepithelial lesion on cytologic smear of cervix (LGSIL): Principal | ICD-10-CM

## 2022-05-17 ENCOUNTER — Ambulatory Visit: Admit: 2022-05-17 | Discharge: 2022-05-18 | Payer: MEDICARE | Attending: Nephrology | Primary: Nephrology

## 2022-05-17 DIAGNOSIS — N1832 Anemia of chronic renal failure, stage 3b (CMS-HCC): Principal | ICD-10-CM

## 2022-05-17 DIAGNOSIS — Z79899 Other long term (current) drug therapy: Principal | ICD-10-CM

## 2022-05-17 DIAGNOSIS — Z940 Kidney transplant status: Principal | ICD-10-CM

## 2022-05-17 DIAGNOSIS — D631 Anemia in chronic kidney disease: Principal | ICD-10-CM

## 2022-05-17 DIAGNOSIS — E860 Dehydration: Principal | ICD-10-CM

## 2022-05-17 DIAGNOSIS — R197 Diarrhea, unspecified: Principal | ICD-10-CM

## 2022-05-17 DIAGNOSIS — Z9483 Pancreas transplant status: Principal | ICD-10-CM

## 2022-05-21 MED FILL — MYCOPHENOLATE SODIUM 180 MG TABLET,DELAYED RELEASE: ORAL | 30 days supply | Qty: 180 | Fill #8

## 2022-05-21 MED FILL — TACROLIMUS 1 MG CAPSULE, IMMEDIATE-RELEASE: ORAL | 30 days supply | Qty: 150 | Fill #3

## 2022-05-22 ENCOUNTER — Ambulatory Visit: Payer: Medicare (Managed Care)

## 2022-05-22 ENCOUNTER — Other Ambulatory Visit: Payer: Self-pay

## 2022-05-22 DIAGNOSIS — R278 Other lack of coordination: Secondary | ICD-10-CM

## 2022-05-22 DIAGNOSIS — M6289 Other specified disorders of muscle: Secondary | ICD-10-CM | POA: Diagnosis not present

## 2022-05-22 DIAGNOSIS — M6281 Muscle weakness (generalized): Secondary | ICD-10-CM

## 2022-05-22 NOTE — Therapy (Signed)
OUTPATIENT PHYSICAL THERAPY FEMALE PELVIC TREATMENT     Patient Name: Kendra Schroeder MRN: [id number] DOB:[birth year], 52 y.o., female Today's Date: 04/17/2022   END OF SESSION:   PT End of Session - 04/17/22 1022       Visit Number 7    Number of Visits 10     Date for PT Re-Evaluation 06/20/22     Authorization Type IE: 02/05/22     Progress Note Due on Visit 10     PT Start Time 1019    PT Stop Time 1046    PT Time Calculation (min) 41 min     Activity Tolerance Patient tolerated treatment well     Behavior During Therapy WFL for tasks assessed/performed                       Past Medical History:  Diagnosis Date   CKD (chronic kidney disease), stage III ([place])     Diabetes mellitus without complication ([place])     GERD (gastroesophageal reflux disease)     Hypertension     Kidney transplant recipient     Myocardial infarct [facility])           Past Surgical History:  Procedure Laterality Date   CARPAL TUNNEL RELEASE Left     COMBINED KIDNEY-PANCREAS TRANSPLANT       EYE SURGERY       NEPHRECTOMY TRANSPLANTED ORGAN       TOE AMPUTATION            Patient Active Problem List    Diagnosis Date Noted   Atherosclerosis of native artery of left lower extremity with rest pain ([place]) 03/11/2018   Chest pain 01/25/2015   GERD (gastroesophageal reflux disease) 01/25/2015   Type 2 diabetes mellitus ([place]) 01/25/2015   HTN (hypertension) 01/25/2015   CKD (chronic kidney disease), stage III ([place]) 01/25/2015   Kidney transplant recipient 01/25/2015  From [name]'s chart: PCP: Jolene Schimke, MD   REFERRING PROVIDER: Herbert Spires, MD   REFERRING DIAG:  N32.81 (ICD-10-CM) - Overactive bladder  N94.89 (ICD-10-CM) - Other specified conditions associated with female genital organs and menstrual cycle   THERAPY DIAG:  Pelvic floor dysfunction   Other lack of coordination   Muscle weakness (generalized)   Rationale for Evaluation and Treatment: Rehabilitation                                                                                                                                                                                       PRECAUTIONS: None   WEIGHT BEARING RESTRICTIONS: No   FALLS:  Has patient fallen in last 6  months? No   OCCUPATION/SOCIAL ACTIVITIES: On disability, cooking, watching movies,    PLOF: Independent   PERTINENT HISTORY/CHART REVIEW: Kidney transplant - 1998  Kidney and pancreas transplant - 2010      LIVING ENVIRONMENT: Lives with: lives with their family Lives in: House/apartment     CHIEF CONCERN: Pt had noticed earlier this year increased urinary frequency/urgency. Pt follows-up regularly each year after kidney transplant bilaterally and pancreas (Pt had about 50-60 staples from both surgeries). Pt does report some mid-back (thoracic) pain that is intermittent. Pt describes the pain as sharp and achy. Pt also would like to address occasional constipation at times but not sure if it related to medications she is taking.        PATIENT GOALS: Pt would like to control the urinary urgency and ideally aid in constipation and having more frequency bowel movements without straining     05/22/22: SUBJECTIVE:  SUBJECTIVE STATEMENT: Pt reported she will be taking her mother to [place] treatment starting next week. Pt reported she's doing well and feels she can d/c today. Pt reported on GROC is at least 95% better.     PAIN:  Are you having pain? No NPRS scale: 0/10 Pain location:    Pain type: n/a Pain description: n/a   Aggravating factors:  Relieving factors:        TODAY'S TREATMENT    Neuromuscular Re-education: Access Code: [email or web address]   Exercises Access Code: [email or web address] URL: [email or web address] Date: 05/22/2022 Prepared by: Geoffry Paradise  Exercises (REVIEW ONLY) - Sidelying Open Book Thoracic Lumbar Rotation and Extension  - 1 x daily - 7 x weekly - 1 sets - 10 reps - 2 hold - Supine  Angels  - 1 x daily - 7 x weekly - 1 sets - 10 reps - Seated Gentle Upper Trapezius Stretch  - 2-3 x daily - 7 x weekly - 1 sets - 3 reps - 15-30 hold - Gentle Levator Scapulae Stretch  - 2-3 x daily - 7 x weekly - 1 sets - 3 reps - 15-30 hold - Seated Pelvic Tilt  - 1 x daily - 7 x weekly - 1 sets - 5-10 reps - Supine Diaphragmatic Breathing  - 1 x daily - 7 x weekly - 1 sets - 5 reps - Dead Bug  - 1 x daily - 3 x weekly - 4 sets - 5 reps  Patient Education - Scar Massage      FOTO score:  73 urinary issues and 55 constipation issues, with higher scores indicating better function.     SELF CARE: Patient Education:  PT reviewed LTG progress and FOTO results. Pt wish to d/c today as she feels much improved and will need to care for her mother during mother's CA treatments.    ASSESSMENT:   Clinical Impression: See d/c summary. All LTGs met or partially met.      Objective Impairments: Abnormal gait, decreased coordination, decreased strength, increased fascial restrictions, improper body mechanics, postural dysfunction, and pain.    Activity Limitations: toileting and locomotion level   Personal Factors: Age, Past/current experiences, Time since onset of injury/illness/exacerbation, and 3+ comorbidities: HTN, T2DM, anemia  are also affecting patient's functional outcome.    Rehab Potential: Good   Clinical Decision Making: Evolving/moderate complexity   Evaluation Complexity: Moderate     GOALS: Goals reviewed with patient? Yes   SHORT TERM GOALS: Target date: 03/12/2022   Patient will demonstrate independence with HEP in order to maximize therapeutic gains and  improve carryover from physical therapy sessions to ADLs in the home and community. Baseline: toileting posture handout given Goal status: MET     [place] TERM GOALS: Target date: 04/16/2022. All unmet LTGs carried forward to new POC: 06/16/22.   Patient will demonstrate coordinated lengthening and relaxation of PFM  with diaphragmatic inhalation in order to decrease spasm and allow for unrestricted elimination of urine/feces for improved overall QOL. Baseline: on average Type 1 BMs and having to strain. 04/17/22: Type 2 75% of the time with intermittent types 3 and 4. 3/26: 75% of the time type 4.  Goal status: MET    2.  Patient will be able to demonstrate improved postural alignment in standing and sitting and overall improved muscular strength in order to reduce pain and restore proper gait mechanics to aid in complete resolution of pelvic floor dysfunction.  Baseline: B rounded shoulders and will assess gait further next session, RLE. 04/17/22: R foot pain causes antalgic gait right now, but pt B rounded shoulders improved, and sits in figure four vs. Knees/ankles crossed. 3/26: foot pain improved, LEs crossed at ankles but seated upright. Goal status: PARTIALLY MET   3.  Patient will decrease worst pain as reported on NPRS by at least 2 points to demonstrate clinically significant reduction in pain in order to restore/improve function and overall QOL. Baseline: 10/10 L upper back/scapular region pain/has to use Salonpas every day. 04/17/22: at worst still 10/10 (was daily but not 2-3x/week) but on normal day 6-7/10 and frequency of severe pain is reduced.  3/26: 10/10 at worst. 6-7/10 on normal day but duration of severe pain has improved. Goal status: PARTIALLY MET   4.  Patient will report confidence in ability to control bladder > 9/10 in order to demonstrate improved function and ability to participate more fully in activities at home and in the community. Baseline: wears pantyliner "just in case" everyday and 7/10 confidence. 04/17/22: 9/10 confidence and wears a poise pantyliner daily (she thinks its Lvl 2). 3/26: 9/10 and level 2 Poise pad. Goal status: PARTIALLY MET   5.  Patient will score >/= 75 and 55 on FOTO Urinary Problem and Bowel Constipation in order to demonstrate improved IAP management,  improved PFM coordination/PFM extensibility, and overall QOL.              Baseline: 72 and 44. 3/26: 73 on urinary problem, 55 bowel constipation Goal status: PARTIALLY MET     PLAN: PT Frequency: 1x/week   PT Duration: 10 weeks   Planned Interventions: Therapeutic exercises, Therapeutic activity, Neuromuscular re-education, Balance training, Gait training, Patient/Family education, Self Care, Joint mobilization, Spinal mobilization, Cryotherapy, Moist heat, scar mobilization, Taping, and Manual therapy   Plan For Next Session:  check LTGs and d/c.  PHYSICAL THERAPY DISCHARGE SUMMARY  Visits from Start of Care: 7  Current functional level related to goals / functional outcomes: see goals above.    Remaining deficits: Back pain.   Education / Equipment: HEP and heel lift.   Patient agrees to discharge. Patient goals were met. Patient is being discharged due to being pleased with the current functional level.    Geoffry Paradise, PT,DPT 04/17/22 10:32 AM Phone: [phone] Fax: [phone]          Geoffry Paradise, PT,DPT 05/22/22 10:23 AM Phone: [phone] Fax: [phone]

## 2022-05-25 MED ORDER — ERGOCALCIFEROL (VITAMIN D2) 1,250 MCG (50,000 UNIT) CAPSULE
ORAL_CAPSULE | ORAL | 0 refills | 84 days | Status: CP
Start: 2022-05-25 — End: 2023-05-25

## 2022-06-07 DIAGNOSIS — Z940 Kidney transplant status: Principal | ICD-10-CM

## 2022-06-18 DIAGNOSIS — Z940 Kidney transplant status: Principal | ICD-10-CM

## 2022-06-18 DIAGNOSIS — Z79899 Other long term (current) drug therapy: Principal | ICD-10-CM

## 2022-06-18 DIAGNOSIS — Z9483 Pancreas transplant status: Principal | ICD-10-CM

## 2022-06-21 ENCOUNTER — Ambulatory Visit: Admit: 2022-06-21 | Discharge: 2022-06-22 | Payer: MEDICARE

## 2022-06-21 ENCOUNTER — Institutional Professional Consult (permissible substitution): Admit: 2022-06-21 | Discharge: 2022-06-22 | Payer: MEDICARE

## 2022-06-21 DIAGNOSIS — Z79899 Other long term (current) drug therapy: Principal | ICD-10-CM

## 2022-06-21 DIAGNOSIS — D849 Immunodeficiency, unspecified: Principal | ICD-10-CM

## 2022-06-21 DIAGNOSIS — Z940 Kidney transplant status: Principal | ICD-10-CM

## 2022-06-21 DIAGNOSIS — Z9483 Pancreas transplant status: Principal | ICD-10-CM

## 2022-07-10 ENCOUNTER — Ambulatory Visit: Admit: 2022-07-10 | Discharge: 2022-07-11 | Payer: MEDICARE | Attending: Family Medicine | Primary: Family Medicine

## 2022-07-10 DIAGNOSIS — E88819 Insulin resistance: Principal | ICD-10-CM

## 2022-07-10 DIAGNOSIS — E1042 Type 1 diabetes mellitus with diabetic polyneuropathy: Principal | ICD-10-CM

## 2022-07-10 DIAGNOSIS — E663 Overweight: Principal | ICD-10-CM

## 2022-07-10 MED ORDER — OZEMPIC 1 MG/DOSE (4 MG/3 ML) SUBCUTANEOUS PEN INJECTOR
SUBCUTANEOUS | 1 refills | 84 days | Status: CP
Start: 2022-07-10 — End: 2023-01-10

## 2022-07-11 ENCOUNTER — Ambulatory Visit: Admit: 2022-07-11 | Discharge: 2022-07-12 | Payer: MEDICARE

## 2022-07-11 MED ORDER — AMOXICILLIN 500 MG-POTASSIUM CLAVULANATE 125 MG TABLET
ORAL_TABLET | Freq: Two times a day (BID) | ORAL | 0 refills | 10 days | Status: CP
Start: 2022-07-11 — End: 2022-07-21

## 2022-07-13 ENCOUNTER — Ambulatory Visit: Admit: 2022-07-13 | Discharge: 2022-07-14 | Payer: MEDICARE

## 2022-07-16 ENCOUNTER — Ambulatory Visit: Admit: 2022-07-16 | Discharge: 2022-07-17 | Payer: MEDICARE | Attending: Podiatrist | Primary: Podiatrist

## 2022-07-16 DIAGNOSIS — E10621 Type 1 diabetes mellitus with foot ulcer: Principal | ICD-10-CM

## 2022-07-16 DIAGNOSIS — L97415 Non-pressure chronic ulcer of right heel and midfoot with muscle involvement without evidence of necrosis: Principal | ICD-10-CM

## 2022-07-18 ENCOUNTER — Ambulatory Visit
Admit: 2022-07-18 | Discharge: 2022-07-19 | Payer: MEDICARE | Attending: Rehabilitative and Restorative Service Providers" | Primary: Rehabilitative and Restorative Service Providers"

## 2022-07-20 DIAGNOSIS — M86679 Other chronic osteomyelitis, unspecified ankle and foot: Principal | ICD-10-CM

## 2022-07-20 MED ORDER — DOXYCYCLINE HYCLATE 100 MG TABLET
ORAL_TABLET | Freq: Two times a day (BID) | ORAL | 3 refills | 0 days
Start: 2022-07-20 — End: ?

## 2022-07-24 MED ORDER — DOXYCYCLINE HYCLATE 100 MG TABLET
ORAL_TABLET | Freq: Two times a day (BID) | ORAL | 3 refills | 90 days | Status: CP
Start: 2022-07-24 — End: ?

## 2022-08-02 ENCOUNTER — Ambulatory Visit: Admit: 2022-08-02 | Discharge: 2022-08-03 | Payer: MEDICARE | Attending: Podiatrist | Primary: Podiatrist

## 2022-08-02 DIAGNOSIS — M25579 Pain in unspecified ankle and joints of unspecified foot: Principal | ICD-10-CM

## 2022-08-06 ENCOUNTER — Ambulatory Visit: Admit: 2022-08-06 | Discharge: 2022-08-07 | Payer: MEDICARE

## 2022-08-07 ENCOUNTER — Ambulatory Visit: Admit: 2022-08-07 | Discharge: 2022-08-08 | Payer: MEDICARE

## 2022-08-07 ENCOUNTER — Telehealth
Admit: 2022-08-07 | Discharge: 2022-08-08 | Payer: MEDICARE | Attending: Infectious Disease | Primary: Infectious Disease

## 2022-08-08 DIAGNOSIS — Z940 Kidney transplant status: Principal | ICD-10-CM

## 2022-08-08 MED ORDER — MYCOPHENOLATE SODIUM 180 MG TABLET,DELAYED RELEASE
ORAL_TABLET | Freq: Two times a day (BID) | ORAL | 11 refills | 30 days | Status: CP
Start: 2022-08-08 — End: ?
  Filled 2022-08-13: qty 180, 30d supply, fill #0

## 2022-08-09 ENCOUNTER — Institutional Professional Consult (permissible substitution): Admit: 2022-08-09 | Discharge: 2022-08-10 | Payer: MEDICARE

## 2022-08-09 DIAGNOSIS — Z940 Kidney transplant status: Principal | ICD-10-CM

## 2022-08-09 DIAGNOSIS — D849 Immunodeficiency, unspecified: Principal | ICD-10-CM

## 2022-08-09 DIAGNOSIS — L97415 Non-pressure chronic ulcer of right heel and midfoot with muscle involvement without evidence of necrosis: Principal | ICD-10-CM

## 2022-08-09 DIAGNOSIS — E10621 Type 1 diabetes mellitus with foot ulcer: Principal | ICD-10-CM

## 2022-08-09 DIAGNOSIS — Z9483 Pancreas transplant status: Principal | ICD-10-CM

## 2022-08-09 DIAGNOSIS — Z79899 Other long term (current) drug therapy: Principal | ICD-10-CM

## 2022-08-09 MED ORDER — NITROFURANTOIN MONOHYDRATE/MACROCRYSTALS 100 MG CAPSULE
ORAL_CAPSULE | Freq: Two times a day (BID) | ORAL | 0 refills | 7 days | Status: CP
Start: 2022-08-09 — End: ?

## 2022-08-13 MED FILL — TACROLIMUS 1 MG CAPSULE, IMMEDIATE-RELEASE: ORAL | 30 days supply | Qty: 150 | Fill #4

## 2022-08-14 ENCOUNTER — Ambulatory Visit: Admit: 2022-08-14 | Discharge: 2022-08-15 | Payer: MEDICARE

## 2022-08-14 DIAGNOSIS — L97413 Non-pressure chronic ulcer of right heel and midfoot with necrosis of muscle: Principal | ICD-10-CM

## 2022-08-14 DIAGNOSIS — M14671 Charcot's joint, right ankle and foot: Principal | ICD-10-CM

## 2022-08-14 DIAGNOSIS — E10621 Type 1 diabetes mellitus with foot ulcer: Principal | ICD-10-CM

## 2022-08-24 ENCOUNTER — Institutional Professional Consult (permissible substitution): Admit: 2022-08-24 | Discharge: 2022-08-25 | Payer: MEDICARE

## 2022-08-28 ENCOUNTER — Ambulatory Visit: Admit: 2022-08-28 | Discharge: 2022-08-29 | Payer: MEDICARE

## 2022-08-28 DIAGNOSIS — R21 Rash and other nonspecific skin eruption: Principal | ICD-10-CM

## 2022-08-28 MED ORDER — TRIAMCINOLONE ACETONIDE 0.1 % TOPICAL OINTMENT
INTRAMUSCULAR | 1 refills | 0.00000 days | Status: CP
Start: 2022-08-28 — End: ?

## 2022-08-31 ENCOUNTER — Institutional Professional Consult (permissible substitution): Admit: 2022-08-31 | Discharge: 2022-09-01 | Payer: MEDICARE | Attending: Podiatrist | Primary: Podiatrist

## 2022-08-31 DIAGNOSIS — L97413 Non-pressure chronic ulcer of right heel and midfoot with necrosis of muscle: Principal | ICD-10-CM

## 2022-08-31 DIAGNOSIS — E10621 Type 1 diabetes mellitus with foot ulcer: Principal | ICD-10-CM

## 2022-09-05 MED FILL — MYCOPHENOLATE SODIUM 180 MG TABLET,DELAYED RELEASE: ORAL | 30 days supply | Qty: 180 | Fill #1

## 2022-09-05 MED FILL — TACROLIMUS 1 MG CAPSULE, IMMEDIATE-RELEASE: ORAL | 30 days supply | Qty: 150 | Fill #5

## 2022-09-07 ENCOUNTER — Institutional Professional Consult (permissible substitution): Admit: 2022-09-07 | Discharge: 2022-09-08 | Payer: MEDICARE

## 2022-09-11 DIAGNOSIS — Z940 Kidney transplant status: Principal | ICD-10-CM

## 2022-09-11 DIAGNOSIS — N1832 Anemia of chronic renal failure, stage 3b (CMS-HCC): Principal | ICD-10-CM

## 2022-09-11 DIAGNOSIS — D631 Anemia in chronic kidney disease: Principal | ICD-10-CM

## 2022-09-13 ENCOUNTER — Institutional Professional Consult (permissible substitution): Admit: 2022-09-13 | Discharge: 2022-09-14 | Payer: MEDICARE

## 2022-09-13 DIAGNOSIS — Z9483 Pancreas transplant status: Principal | ICD-10-CM

## 2022-09-13 DIAGNOSIS — D849 Immunodeficiency, unspecified: Principal | ICD-10-CM

## 2022-09-13 DIAGNOSIS — D631 Anemia in chronic kidney disease: Principal | ICD-10-CM

## 2022-09-13 DIAGNOSIS — N1832 Anemia of chronic renal failure, stage 3b (CMS-HCC): Principal | ICD-10-CM

## 2022-09-13 DIAGNOSIS — Z940 Kidney transplant status: Principal | ICD-10-CM

## 2022-09-13 DIAGNOSIS — Z79899 Other long term (current) drug therapy: Principal | ICD-10-CM

## 2022-09-18 ENCOUNTER — Ambulatory Visit: Admit: 2022-09-18 | Discharge: 2022-09-19 | Payer: MEDICARE

## 2022-09-18 DIAGNOSIS — Z8639 Personal history of other endocrine, nutritional and metabolic disease: Principal | ICD-10-CM

## 2022-09-18 DIAGNOSIS — L97413 Non-pressure chronic ulcer of right heel and midfoot with necrosis of muscle: Principal | ICD-10-CM

## 2022-09-18 DIAGNOSIS — E10621 Type 1 diabetes mellitus with foot ulcer: Principal | ICD-10-CM

## 2022-09-18 DIAGNOSIS — Z940 Kidney transplant status: Principal | ICD-10-CM

## 2022-10-09 MED FILL — MYCOPHENOLATE SODIUM 180 MG TABLET,DELAYED RELEASE: ORAL | 30 days supply | Qty: 180 | Fill #2

## 2022-10-09 MED FILL — TACROLIMUS 1 MG CAPSULE, IMMEDIATE-RELEASE: ORAL | 30 days supply | Qty: 150 | Fill #6

## 2022-10-11 ENCOUNTER — Institutional Professional Consult (permissible substitution): Admit: 2022-10-11 | Discharge: 2022-10-12 | Payer: MEDICARE

## 2022-10-11 DIAGNOSIS — D631 Anemia in chronic kidney disease: Principal | ICD-10-CM

## 2022-10-11 DIAGNOSIS — D849 Immunodeficiency, unspecified: Principal | ICD-10-CM

## 2022-10-11 DIAGNOSIS — Z940 Kidney transplant status: Principal | ICD-10-CM

## 2022-10-11 DIAGNOSIS — Z79899 Other long term (current) drug therapy: Principal | ICD-10-CM

## 2022-10-11 DIAGNOSIS — N1832 Anemia of chronic renal failure, stage 3b (CMS-HCC): Principal | ICD-10-CM

## 2022-10-11 DIAGNOSIS — Z9483 Pancreas transplant status: Principal | ICD-10-CM

## 2022-10-17 ENCOUNTER — Ambulatory Visit: Admit: 2022-10-17 | Discharge: 2022-10-18 | Payer: MEDICARE

## 2022-10-17 DIAGNOSIS — M549 Dorsalgia, unspecified: Principal | ICD-10-CM

## 2022-10-17 DIAGNOSIS — H9201 Otalgia, right ear: Principal | ICD-10-CM

## 2022-10-17 DIAGNOSIS — I10 Essential (primary) hypertension: Principal | ICD-10-CM

## 2022-10-17 DIAGNOSIS — R0609 Other forms of dyspnea: Principal | ICD-10-CM

## 2022-10-17 DIAGNOSIS — Z0000 Encounter for general adult medical examination without abnormal findings: Principal | ICD-10-CM

## 2022-10-17 DIAGNOSIS — R000 Tachycardia, unspecified: Principal | ICD-10-CM

## 2022-10-17 MED ORDER — ALBUTEROL SULFATE HFA 90 MCG/ACTUATION AEROSOL INHALER
RESPIRATORY_TRACT | 1 refills | 0 days | Status: CP | PRN
Start: 2022-10-17 — End: 2023-10-17

## 2022-10-17 MED ORDER — CYCLOBENZAPRINE 5 MG TABLET
ORAL_TABLET | Freq: Every evening | ORAL | 0 refills | 30 days | Status: CP | PRN
Start: 2022-10-17 — End: ?

## 2022-10-30 ENCOUNTER — Ambulatory Visit: Admit: 2022-10-30 | Discharge: 2022-10-31 | Payer: MEDICARE

## 2022-11-05 ENCOUNTER — Ambulatory Visit: Admit: 2022-11-05 | Discharge: 2022-11-06 | Payer: MEDICARE

## 2022-11-05 DIAGNOSIS — L308 Other specified dermatitis: Principal | ICD-10-CM

## 2022-11-05 MED ORDER — TRIAMCINOLONE ACETONIDE 0.1 % TOPICAL CREAM
INTRAMUSCULAR | 5 refills | 0.00000 days | Status: CP
Start: 2022-11-05 — End: ?

## 2022-11-05 MED FILL — TACROLIMUS 1 MG CAPSULE, IMMEDIATE-RELEASE: ORAL | 30 days supply | Qty: 150 | Fill #7

## 2022-11-05 MED FILL — MYCOPHENOLATE SODIUM 180 MG TABLET,DELAYED RELEASE: ORAL | 30 days supply | Qty: 180 | Fill #3

## 2022-11-06 ENCOUNTER — Ambulatory Visit: Admit: 2022-11-06 | Discharge: 2022-11-07 | Payer: MEDICARE

## 2022-11-12 DIAGNOSIS — Z79899 Other long term (current) drug therapy: Principal | ICD-10-CM

## 2022-11-12 DIAGNOSIS — Z9483 Pancreas transplant status: Principal | ICD-10-CM

## 2022-11-12 DIAGNOSIS — Z940 Kidney transplant status: Principal | ICD-10-CM

## 2022-11-13 ENCOUNTER — Ambulatory Visit: Admit: 2022-11-13 | Discharge: 2022-11-14 | Payer: MEDICARE | Attending: Family Medicine | Primary: Family Medicine

## 2022-11-13 ENCOUNTER — Ambulatory Visit: Admit: 2022-11-13 | Discharge: 2022-11-14 | Payer: MEDICARE | Attending: Podiatrist | Primary: Podiatrist

## 2022-11-13 DIAGNOSIS — E669 Obesity, unspecified: Principal | ICD-10-CM

## 2022-11-13 DIAGNOSIS — E1042 Type 1 diabetes mellitus with diabetic polyneuropathy: Principal | ICD-10-CM

## 2022-11-13 DIAGNOSIS — E11621 Type 2 diabetes mellitus with foot ulcer: Principal | ICD-10-CM

## 2022-11-13 DIAGNOSIS — L97412 Non-pressure chronic ulcer of right heel and midfoot with fat layer exposed: Principal | ICD-10-CM

## 2022-11-13 DIAGNOSIS — Z6830 Body mass index (BMI) 30.0-30.9, adult: Principal | ICD-10-CM

## 2022-11-14 ENCOUNTER — Ambulatory Visit: Admit: 2022-11-14 | Discharge: 2022-11-15 | Payer: MEDICARE

## 2022-11-14 ENCOUNTER — Ambulatory Visit: Admit: 2022-11-14 | Discharge: 2022-11-15 | Payer: MEDICARE | Attending: Nephrology | Primary: Nephrology

## 2022-11-14 DIAGNOSIS — Z79899 Other long term (current) drug therapy: Principal | ICD-10-CM

## 2022-11-14 DIAGNOSIS — D631 Anemia in chronic kidney disease: Principal | ICD-10-CM

## 2022-11-14 DIAGNOSIS — Z9483 Pancreas transplant status: Principal | ICD-10-CM

## 2022-11-14 DIAGNOSIS — D849 Immunodeficiency, unspecified: Principal | ICD-10-CM

## 2022-11-14 DIAGNOSIS — I10 Essential (primary) hypertension: Principal | ICD-10-CM

## 2022-11-14 DIAGNOSIS — N1831 Anemia in stage 3a chronic kidney disease (CMS-HCC): Principal | ICD-10-CM

## 2022-11-14 DIAGNOSIS — Z940 Kidney transplant status: Principal | ICD-10-CM

## 2022-11-14 DIAGNOSIS — E872 Metabolic acidosis: Principal | ICD-10-CM

## 2022-11-14 DIAGNOSIS — R600 Localized edema: Principal | ICD-10-CM

## 2022-11-14 MED ORDER — CARVEDILOL 12.5 MG TABLET
ORAL_TABLET | Freq: Two times a day (BID) | ORAL | 3 refills | 90.00000 days | Status: CP
Start: 2022-11-14 — End: 2022-11-14

## 2022-11-19 ENCOUNTER — Ambulatory Visit: Admit: 2022-11-19 | Discharge: 2022-11-20 | Payer: MEDICARE | Attending: Podiatrist | Primary: Podiatrist

## 2022-11-19 DIAGNOSIS — E11621 Type 2 diabetes mellitus with foot ulcer: Principal | ICD-10-CM

## 2022-11-19 DIAGNOSIS — E10621 Type 1 diabetes mellitus with foot ulcer: Principal | ICD-10-CM

## 2022-11-19 DIAGNOSIS — L97519 Non-pressure chronic ulcer of other part of right foot with unspecified severity: Principal | ICD-10-CM

## 2022-11-19 DIAGNOSIS — L97412 Non-pressure chronic ulcer of right heel and midfoot with fat layer exposed: Principal | ICD-10-CM

## 2022-11-26 ENCOUNTER — Ambulatory Visit: Admit: 2022-11-26 | Discharge: 2022-11-27 | Payer: MEDICARE | Attending: Podiatrist | Primary: Podiatrist

## 2022-11-26 DIAGNOSIS — D849 Immunodeficiency, unspecified: Principal | ICD-10-CM

## 2022-11-26 DIAGNOSIS — M14671 Charcot's joint, right ankle and foot: Principal | ICD-10-CM

## 2022-11-26 DIAGNOSIS — I739 Peripheral vascular disease, unspecified: Principal | ICD-10-CM

## 2022-11-26 DIAGNOSIS — E11621 Type 2 diabetes mellitus with foot ulcer: Principal | ICD-10-CM

## 2022-11-26 DIAGNOSIS — L97412 Non-pressure chronic ulcer of right heel and midfoot with fat layer exposed: Principal | ICD-10-CM

## 2022-11-26 MED ORDER — REGRANEX 0.01 % TOPICAL GEL
Freq: Every morning | TOPICAL | 0 refills | 0 days | Status: CP
Start: 2022-11-26 — End: ?

## 2022-11-27 DIAGNOSIS — Z940 Kidney transplant status: Principal | ICD-10-CM

## 2022-11-27 MED ORDER — MYCOPHENOLATE SODIUM 180 MG TABLET,DELAYED RELEASE
ORAL_TABLET | Freq: Two times a day (BID) | ORAL | 11 refills | 30 days | Status: CP
Start: 2022-11-27 — End: ?
  Filled 2022-12-04: qty 180, 30d supply, fill #0

## 2022-11-27 MED ORDER — TACROLIMUS 1 MG CAPSULE, IMMEDIATE-RELEASE
ORAL_CAPSULE | ORAL | 11 refills | 30 days | Status: CP
Start: 2022-11-27 — End: ?
  Filled 2022-12-04: qty 150, 30d supply, fill #0

## 2022-12-03 ENCOUNTER — Ambulatory Visit: Admit: 2022-12-03 | Discharge: 2022-12-04 | Payer: MEDICARE | Attending: Podiatrist | Primary: Podiatrist

## 2022-12-06 DIAGNOSIS — Z940 Kidney transplant status: Principal | ICD-10-CM

## 2022-12-06 MED ORDER — MYCOPHENOLATE SODIUM 180 MG TABLET,DELAYED RELEASE
ORAL_TABLET | Freq: Two times a day (BID) | ORAL | 11 refills | 45 days | Status: CP
Start: 2022-12-06 — End: ?
  Filled 2023-01-10: qty 180, 45d supply, fill #0

## 2022-12-10 ENCOUNTER — Ambulatory Visit: Admit: 2022-12-10 | Discharge: 2022-12-11 | Payer: MEDICARE | Attending: Podiatrist | Primary: Podiatrist

## 2022-12-17 ENCOUNTER — Ambulatory Visit: Admit: 2022-12-17 | Discharge: 2022-12-18 | Payer: MEDICARE | Attending: Podiatrist | Primary: Podiatrist

## 2022-12-17 DIAGNOSIS — L97422 Non-pressure chronic ulcer of left heel and midfoot with fat layer exposed: Principal | ICD-10-CM

## 2022-12-17 DIAGNOSIS — E11621 Type 2 diabetes mellitus with foot ulcer: Principal | ICD-10-CM

## 2022-12-19 ENCOUNTER — Institutional Professional Consult (permissible substitution): Admit: 2022-12-19 | Discharge: 2022-12-20 | Payer: MEDICARE

## 2022-12-19 DIAGNOSIS — Z79899 Other long term (current) drug therapy: Principal | ICD-10-CM

## 2022-12-19 DIAGNOSIS — Z9483 Pancreas transplant status: Principal | ICD-10-CM

## 2022-12-19 DIAGNOSIS — D849 Immunodeficiency, unspecified: Principal | ICD-10-CM

## 2022-12-19 DIAGNOSIS — Z940 Kidney transplant status: Principal | ICD-10-CM

## 2022-12-21 ENCOUNTER — Ambulatory Visit: Admit: 2022-12-21 | Discharge: 2022-12-22 | Payer: MEDICARE | Attending: Vascular Surgery | Primary: Vascular Surgery

## 2022-12-21 DIAGNOSIS — L97519 Non-pressure chronic ulcer of other part of right foot with unspecified severity: Principal | ICD-10-CM

## 2022-12-21 DIAGNOSIS — I739 Peripheral vascular disease, unspecified: Principal | ICD-10-CM

## 2022-12-24 ENCOUNTER — Ambulatory Visit: Admit: 2022-12-24 | Discharge: 2022-12-24 | Payer: MEDICARE | Attending: Podiatrist | Primary: Podiatrist

## 2023-01-10 MED FILL — TACROLIMUS 1 MG CAPSULE, IMMEDIATE-RELEASE: ORAL | 30 days supply | Qty: 150 | Fill #1

## 2023-01-15 ENCOUNTER — Ambulatory Visit: Admit: 2023-01-15 | Discharge: 2023-01-16 | Payer: MEDICARE | Attending: Podiatrist | Primary: Podiatrist

## 2023-01-15 DIAGNOSIS — E10621 Type 1 diabetes mellitus with foot ulcer: Principal | ICD-10-CM

## 2023-01-15 DIAGNOSIS — L97422 Non-pressure chronic ulcer of left heel and midfoot with fat layer exposed: Principal | ICD-10-CM

## 2023-01-28 ENCOUNTER — Ambulatory Visit: Admit: 2023-01-28 | Discharge: 2023-01-29 | Payer: MEDICARE

## 2023-01-28 ENCOUNTER — Ambulatory Visit: Admit: 2023-01-28 | Discharge: 2023-01-28 | Payer: MEDICARE

## 2023-01-28 DIAGNOSIS — I739 Peripheral vascular disease, unspecified: Principal | ICD-10-CM

## 2023-01-28 DIAGNOSIS — L97519 Non-pressure chronic ulcer of other part of right foot with unspecified severity: Principal | ICD-10-CM

## 2023-02-01 DIAGNOSIS — I739 Peripheral vascular disease, unspecified: Principal | ICD-10-CM

## 2023-02-01 DIAGNOSIS — L97519 Non-pressure chronic ulcer of other part of right foot with unspecified severity: Principal | ICD-10-CM

## 2023-02-04 ENCOUNTER — Ambulatory Visit: Admit: 2023-02-04 | Discharge: 2023-02-05 | Payer: MEDICARE | Attending: Podiatrist | Primary: Podiatrist

## 2023-02-04 DIAGNOSIS — E10621 Type 1 diabetes mellitus with foot ulcer: Principal | ICD-10-CM

## 2023-02-04 DIAGNOSIS — M14671 Charcot's joint, right ankle and foot: Principal | ICD-10-CM

## 2023-02-04 DIAGNOSIS — Z940 Kidney transplant status: Principal | ICD-10-CM

## 2023-02-04 DIAGNOSIS — L97422 Non-pressure chronic ulcer of left heel and midfoot with fat layer exposed: Principal | ICD-10-CM

## 2023-02-04 DIAGNOSIS — M86671 Other chronic osteomyelitis, right ankle and foot: Principal | ICD-10-CM

## 2023-02-04 DIAGNOSIS — D849 Immunodeficiency, unspecified: Principal | ICD-10-CM

## 2023-02-08 DIAGNOSIS — E1042 Type 1 diabetes mellitus with diabetic polyneuropathy: Principal | ICD-10-CM

## 2023-02-08 MED ORDER — OZEMPIC 1 MG/DOSE (4 MG/3 ML) SUBCUTANEOUS PEN INJECTOR
1 refills | 0.00 days
Start: 2023-02-08 — End: ?

## 2023-02-11 MED ORDER — OZEMPIC 1 MG/DOSE (4 MG/3 ML) SUBCUTANEOUS PEN INJECTOR
1 refills | 0.00 days | Status: CP
Start: 2023-02-11 — End: ?

## 2023-02-18 ENCOUNTER — Ambulatory Visit: Admit: 2023-02-18 | Payer: MEDICARE | Attending: Podiatrist | Primary: Podiatrist

## 2023-02-21 MED FILL — TACROLIMUS 1 MG CAPSULE, IMMEDIATE-RELEASE: ORAL | 30 days supply | Qty: 150 | Fill #2

## 2023-02-21 MED FILL — MYCOPHENOLATE SODIUM 180 MG TABLET,DELAYED RELEASE: ORAL | 45 days supply | Qty: 180 | Fill #1

## 2023-03-05 ENCOUNTER — Ambulatory Visit: Admit: 2023-03-05 | Discharge: 2023-03-06 | Payer: MEDICARE | Attending: Vascular Surgery | Primary: Vascular Surgery

## 2023-03-05 DIAGNOSIS — I739 Peripheral vascular disease, unspecified: Principal | ICD-10-CM

## 2023-03-05 DIAGNOSIS — L97519 Non-pressure chronic ulcer of other part of right foot with unspecified severity: Principal | ICD-10-CM

## 2023-03-06 ENCOUNTER — Ambulatory Visit: Admit: 2023-03-06 | Discharge: 2023-03-07 | Payer: MEDICARE | Attending: Family Medicine | Primary: Family Medicine

## 2023-03-06 DIAGNOSIS — E1042 Type 1 diabetes mellitus with diabetic polyneuropathy: Principal | ICD-10-CM

## 2023-03-06 DIAGNOSIS — Z6830 Body mass index (BMI) 30.0-30.9, adult: Principal | ICD-10-CM

## 2023-03-06 DIAGNOSIS — E66811 Class 1 obesity with serious comorbidity and body mass index (BMI) of 30.0 to 30.9 in adult, unspecified obesity type: Principal | ICD-10-CM

## 2023-03-07 MED ORDER — MIRABEGRON ER 25 MG TABLET,EXTENDED RELEASE 24 HR
ORAL_TABLET | Freq: Every day | ORAL | 11 refills | 30.00 days | Status: CP
Start: 2023-03-07 — End: ?

## 2023-03-22 ENCOUNTER — Ambulatory Visit: Admit: 2023-03-22 | Discharge: 2023-03-28 | Payer: MEDICARE

## 2023-03-22 ENCOUNTER — Ambulatory Visit
Admit: 2023-03-22 | Discharge: 2023-03-28 | Disposition: A | Discharge status: Home / Self Care | Payer: MEDICARE | Admitting: Nephrology

## 2023-03-22 ENCOUNTER — Ambulatory Visit: Admit: 2023-03-22 | Discharge: 2023-03-23 | Payer: MEDICARE | Attending: Podiatrist | Primary: Podiatrist

## 2023-03-22 ENCOUNTER — Inpatient Hospital Stay
Admit: 2023-03-22 | Discharge: 2023-03-28 | Disposition: A | Discharge status: Home / Self Care | Payer: MEDICARE | Admitting: Nephrology

## 2023-03-22 DIAGNOSIS — L03116 Cellulitis of left lower limb: Principal | ICD-10-CM

## 2023-03-22 DIAGNOSIS — E10621 Type 1 diabetes mellitus with foot ulcer: Principal | ICD-10-CM

## 2023-03-22 DIAGNOSIS — L97412 Non-pressure chronic ulcer of right heel and midfoot with fat layer exposed: Principal | ICD-10-CM

## 2023-03-27 MED ORDER — TACROLIMUS 1 MG CAPSULE, IMMEDIATE-RELEASE
ORAL_CAPSULE | Freq: Two times a day (BID) | ORAL | 2 refills | 30.00 days | Status: CP
Start: 2023-03-27 — End: 2023-06-25
  Filled 2023-03-27: qty 180, 30d supply, fill #0

## 2023-03-27 MED ORDER — CARVEDILOL 12.5 MG TABLET
ORAL_TABLET | Freq: Two times a day (BID) | ORAL | 0 refills | 30.00 days | Status: CP
Start: 2023-03-27 — End: 2023-04-26
  Filled 2023-03-27: qty 60, 30d supply, fill #0

## 2023-03-27 MED ORDER — SODIUM BICARBONATE 650 MG TABLET
ORAL_TABLET | Freq: Two times a day (BID) | ORAL | 0 refills | 50.00 days | Status: CP
Start: 2023-03-27 — End: 2023-04-26

## 2023-03-27 MED ORDER — MYCOPHENOLATE SODIUM 180 MG TABLET,DELAYED RELEASE
ORAL_TABLET | Freq: Two times a day (BID) | ORAL | 2 refills | 30.00 days | Status: CP
Start: 2023-03-27 — End: 2023-06-25
  Filled 2023-03-27: qty 60, 30d supply, fill #0

## 2023-03-28 MED ORDER — SODIUM BICARBONATE 650 MG TABLET
ORAL_TABLET | Freq: Two times a day (BID) | ORAL | 0 refills | 30.00 days | Status: CP
Start: 2023-03-28 — End: 2023-04-27

## 2023-03-28 MED ORDER — LINEZOLID 600 MG TABLET
ORAL_TABLET | Freq: Two times a day (BID) | ORAL | 0 refills | 11.00 days
Start: 2023-03-28 — End: 2023-03-28

## 2023-03-29 MED ORDER — OMEPRAZOLE 40 MG CAPSULE,DELAYED RELEASE
ORAL_CAPSULE | ORAL | 3 refills | 0.00 days
Start: 2023-03-29 — End: ?

## 2023-04-01 MED ORDER — OMEPRAZOLE 40 MG CAPSULE,DELAYED RELEASE
ORAL_CAPSULE | ORAL | 3 refills | 0.00 days | Status: CP
Start: 2023-04-01 — End: ?

## 2023-04-02 DIAGNOSIS — R0609 Other forms of dyspnea: Principal | ICD-10-CM

## 2023-04-02 MED ORDER — PREDNISONE 2.5 MG TABLET
ORAL_TABLET | Freq: Every day | ORAL | 3 refills | 90.00 days | Status: CP
Start: 2023-04-02 — End: ?

## 2023-04-02 MED ORDER — GABAPENTIN 100 MG CAPSULE
ORAL_CAPSULE | Freq: Every evening | ORAL | 3 refills | 90.00 days | Status: CP
Start: 2023-04-02 — End: ?

## 2023-04-02 MED ORDER — ATORVASTATIN 40 MG TABLET
ORAL_TABLET | Freq: Every day | ORAL | 3 refills | 0.00 days
Start: 2023-04-02 — End: ?

## 2023-04-02 MED ORDER — ALBUTEROL SULFATE HFA 90 MCG/ACTUATION AEROSOL INHALER
1 refills | 0.00 days
Start: 2023-04-02 — End: ?

## 2023-04-02 MED ORDER — METOCLOPRAMIDE 5 MG TABLET
ORAL_TABLET | Freq: Two times a day (BID) | ORAL | 3 refills | 0.00 days
Start: 2023-04-02 — End: ?

## 2023-04-03 MED ORDER — ATORVASTATIN 40 MG TABLET
ORAL_TABLET | Freq: Every day | ORAL | 3 refills | 90.00 days
Start: 2023-04-03 — End: ?

## 2023-04-03 MED ORDER — ALBUTEROL SULFATE HFA 90 MCG/ACTUATION AEROSOL INHALER
1 refills | 0.00 days
Start: 2023-04-03 — End: ?

## 2023-04-04 MED ORDER — ATORVASTATIN 40 MG TABLET
ORAL_TABLET | Freq: Every day | ORAL | 3 refills | 0.00 days
Start: 2023-04-04 — End: ?

## 2023-04-05 DIAGNOSIS — Z9483 Pancreas transplant status: Principal | ICD-10-CM

## 2023-04-05 DIAGNOSIS — Z940 Kidney transplant status: Principal | ICD-10-CM

## 2023-04-05 DIAGNOSIS — M869 Osteomyelitis, unspecified: Principal | ICD-10-CM

## 2023-04-05 MED ORDER — ATORVASTATIN 40 MG TABLET
ORAL_TABLET | Freq: Every day | ORAL | 3 refills | 90.00 days
Start: 2023-04-05 — End: ?

## 2023-04-09 ENCOUNTER — Ambulatory Visit: Admit: 2023-04-09 | Discharge: 2023-04-09 | Payer: MEDICARE | Attending: Podiatrist | Primary: Podiatrist

## 2023-04-09 DIAGNOSIS — L97419 Non-pressure chronic ulcer of right heel and midfoot with unspecified severity: Principal | ICD-10-CM

## 2023-04-09 DIAGNOSIS — Z872 Personal history of diseases of the skin and subcutaneous tissue: Principal | ICD-10-CM

## 2023-04-09 DIAGNOSIS — E10621 Type 1 diabetes mellitus with foot ulcer: Principal | ICD-10-CM

## 2023-04-09 DIAGNOSIS — R7881 Bacteremia: Principal | ICD-10-CM

## 2023-04-09 DIAGNOSIS — D849 Immunodeficiency, unspecified: Principal | ICD-10-CM

## 2023-04-10 MED ORDER — AMOXICILLIN 500 MG CAPSULE
ORAL_CAPSULE | Freq: Two times a day (BID) | ORAL | 0 refills | 30.00 days | Status: CP
Start: 2023-04-10 — End: ?

## 2023-04-12 ENCOUNTER — Ambulatory Visit: Admit: 2023-04-12 | Discharge: 2023-04-12 | Payer: MEDICARE

## 2023-04-12 DIAGNOSIS — M869 Osteomyelitis, unspecified: Principal | ICD-10-CM

## 2023-04-12 DIAGNOSIS — Z9483 Pancreas transplant status: Principal | ICD-10-CM

## 2023-04-12 DIAGNOSIS — Z940 Kidney transplant status: Principal | ICD-10-CM

## 2023-04-12 DIAGNOSIS — Z79899 Other long term (current) drug therapy: Principal | ICD-10-CM

## 2023-04-18 ENCOUNTER — Encounter
Admit: 2023-04-18 | Discharge: 2023-04-19 | Payer: MEDICARE | Attending: Infectious Disease | Primary: Infectious Disease

## 2023-04-18 MED ORDER — FLUCONAZOLE 200 MG TABLET
ORAL_TABLET | 0 refills | 0.00 days | Status: CP
Start: 2023-04-18 — End: ?

## 2023-04-18 MED ORDER — DOXYCYCLINE HYCLATE 100 MG TABLET
ORAL_TABLET | Freq: Two times a day (BID) | ORAL | 3 refills | 90.00 days | Status: CP
Start: 2023-04-18 — End: 2024-04-17

## 2023-04-23 ENCOUNTER — Ambulatory Visit: Admit: 2023-04-23 | Discharge: 2023-04-24 | Payer: MEDICARE | Attending: Podiatrist | Primary: Podiatrist

## 2023-04-23 DIAGNOSIS — M14671 Charcot's joint, right ankle and foot: Principal | ICD-10-CM

## 2023-04-23 DIAGNOSIS — S8011XA Contusion of right lower leg, initial encounter: Principal | ICD-10-CM

## 2023-04-23 DIAGNOSIS — D849 Immunodeficiency, unspecified: Principal | ICD-10-CM

## 2023-04-30 ENCOUNTER — Inpatient Hospital Stay: Admit: 2023-04-30 | Discharge: 2023-05-01 | Payer: MEDICARE

## 2023-05-01 ENCOUNTER — Ambulatory Visit: Admit: 2023-05-01 | Discharge: 2023-05-02 | Payer: MEDICARE | Attending: Podiatrist | Primary: Podiatrist

## 2023-05-01 DIAGNOSIS — M14679 Charcot's joint, unspecified ankle and foot: Principal | ICD-10-CM

## 2023-05-07 MED FILL — TACROLIMUS 1 MG CAPSULE, IMMEDIATE-RELEASE: ORAL | 30 days supply | Qty: 180 | Fill #0

## 2023-05-07 MED FILL — MYCOPHENOLATE SODIUM 180 MG TABLET,DELAYED RELEASE: ORAL | 30 days supply | Qty: 60 | Fill #0

## 2023-05-14 ENCOUNTER — Inpatient Hospital Stay: Admit: 2023-05-14 | Discharge: 2023-05-14 | Payer: MEDICARE

## 2023-05-14 DIAGNOSIS — E559 Vitamin D deficiency, unspecified: Principal | ICD-10-CM

## 2023-05-14 DIAGNOSIS — D631 Anemia in chronic kidney disease: Principal | ICD-10-CM

## 2023-05-14 DIAGNOSIS — Z940 Kidney transplant status: Principal | ICD-10-CM

## 2023-05-14 DIAGNOSIS — N1832 Anemia of chronic renal failure, stage 3b (CMS-HCC): Principal | ICD-10-CM

## 2023-05-14 DIAGNOSIS — Z79899 Other long term (current) drug therapy: Principal | ICD-10-CM

## 2023-05-14 DIAGNOSIS — Z1231 Encounter for screening mammogram for malignant neoplasm of breast: Principal | ICD-10-CM

## 2023-05-14 DIAGNOSIS — Z9483 Pancreas transplant status: Principal | ICD-10-CM

## 2023-05-15 ENCOUNTER — Ambulatory Visit: Admit: 2023-05-15 | Discharge: 2023-05-16 | Payer: MEDICARE

## 2023-05-15 ENCOUNTER — Ambulatory Visit: Admit: 2023-05-15 | Discharge: 2023-05-16 | Payer: MEDICARE | Attending: Nephrology | Primary: Nephrology

## 2023-05-15 DIAGNOSIS — R600 Localized edema: Principal | ICD-10-CM

## 2023-05-15 DIAGNOSIS — E872 Metabolic acidosis: Principal | ICD-10-CM

## 2023-05-15 DIAGNOSIS — D849 Immunodeficiency, unspecified: Principal | ICD-10-CM

## 2023-05-15 DIAGNOSIS — Z9483 Pancreas transplant status: Principal | ICD-10-CM

## 2023-05-15 DIAGNOSIS — D631 Anemia in chronic kidney disease: Principal | ICD-10-CM

## 2023-05-15 DIAGNOSIS — Z79899 Other long term (current) drug therapy: Principal | ICD-10-CM

## 2023-05-15 DIAGNOSIS — N1831 Anemia in stage 3a chronic kidney disease (CMS-HCC): Principal | ICD-10-CM

## 2023-05-15 DIAGNOSIS — N1832 Anemia of chronic renal failure, stage 3b (CMS-HCC): Principal | ICD-10-CM

## 2023-05-15 DIAGNOSIS — I10 Essential (primary) hypertension: Principal | ICD-10-CM

## 2023-05-15 DIAGNOSIS — Z940 Kidney transplant status: Principal | ICD-10-CM

## 2023-05-15 DIAGNOSIS — E559 Vitamin D deficiency, unspecified: Principal | ICD-10-CM

## 2023-05-15 MED ORDER — SUMATRIPTAN 50 MG TABLET
ORAL_TABLET | 2 refills | 0.00 days | Status: CP
Start: 2023-05-15 — End: ?

## 2023-05-15 MED ORDER — SODIUM BICARBONATE 650 MG TABLET
ORAL_TABLET | Freq: Two times a day (BID) | ORAL | 11 refills | 30.00 days | Status: CP
Start: 2023-05-15 — End: 2023-06-14

## 2023-05-16 ENCOUNTER — Ambulatory Visit: Admit: 2023-05-16 | Discharge: 2023-05-17 | Payer: MEDICARE | Attending: Podiatrist | Primary: Podiatrist

## 2023-05-16 DIAGNOSIS — E1142 Type 2 diabetes mellitus with diabetic polyneuropathy: Principal | ICD-10-CM

## 2023-05-16 DIAGNOSIS — M14671 Charcot's joint, right ankle and foot: Principal | ICD-10-CM

## 2023-05-16 DIAGNOSIS — L97911 Non-pressure chronic ulcer of unspecified part of right lower leg limited to breakdown of skin: Principal | ICD-10-CM

## 2023-05-20 DIAGNOSIS — Z9483 Pancreas transplant status: Principal | ICD-10-CM

## 2023-05-20 DIAGNOSIS — Z940 Kidney transplant status: Principal | ICD-10-CM

## 2023-05-20 MED ORDER — MYCOPHENOLATE SODIUM 180 MG TABLET,DELAYED RELEASE
ORAL_TABLET | Freq: Two times a day (BID) | ORAL | 3 refills | 90 days | Status: CP
Start: 2023-05-20 — End: 2024-05-19
  Filled 2023-06-12: qty 360, 90d supply, fill #0

## 2023-05-21 ENCOUNTER — Ambulatory Visit
Admit: 2023-05-21 | Discharge: 2023-05-22 | Attending: Rehabilitative and Restorative Service Providers" | Primary: Rehabilitative and Restorative Service Providers"

## 2023-05-28 ENCOUNTER — Ambulatory Visit: Admit: 2023-05-28 | Discharge: 2023-05-28 | Attending: Acute Care | Primary: Acute Care

## 2023-05-28 ENCOUNTER — Ambulatory Visit: Admit: 2023-05-28 | Discharge: 2023-05-28

## 2023-05-28 DIAGNOSIS — R292 Abnormal reflex: Principal | ICD-10-CM

## 2023-05-28 DIAGNOSIS — R200 Anesthesia of skin: Principal | ICD-10-CM

## 2023-05-28 DIAGNOSIS — G629 Polyneuropathy, unspecified: Principal | ICD-10-CM

## 2023-05-28 DIAGNOSIS — Z8659 Personal history of other mental and behavioral disorders: Principal | ICD-10-CM

## 2023-05-28 DIAGNOSIS — Z131 Encounter for screening for diabetes mellitus: Principal | ICD-10-CM

## 2023-05-28 DIAGNOSIS — R29898 Other symptoms and signs involving the musculoskeletal system: Principal | ICD-10-CM

## 2023-05-28 MED ORDER — DIAZEPAM 5 MG TABLET
ORAL_TABLET | ORAL | 0 refills | 1 days | Status: CP | PRN
Start: 2023-05-28 — End: ?

## 2023-06-03 ENCOUNTER — Inpatient Hospital Stay: Admit: 2023-06-03 | Discharge: 2023-06-04

## 2023-06-04 ENCOUNTER — Ambulatory Visit: Admit: 2023-06-04 | Discharge: 2023-06-05 | Attending: Podiatrist | Primary: Podiatrist

## 2023-06-04 DIAGNOSIS — M14679 Charcot's joint, unspecified ankle and foot: Principal | ICD-10-CM

## 2023-06-04 DIAGNOSIS — Z872 Personal history of diseases of the skin and subcutaneous tissue: Principal | ICD-10-CM

## 2023-06-04 DIAGNOSIS — S98139A Complete traumatic amputation of one unspecified lesser toe, initial encounter: Principal | ICD-10-CM

## 2023-06-04 DIAGNOSIS — L84 Corns and callosities: Principal | ICD-10-CM

## 2023-06-04 DIAGNOSIS — E1142 Type 2 diabetes mellitus with diabetic polyneuropathy: Principal | ICD-10-CM

## 2023-06-12 MED FILL — TACROLIMUS 1 MG CAPSULE, IMMEDIATE-RELEASE: ORAL | 30 days supply | Qty: 180 | Fill #1

## 2023-06-13 ENCOUNTER — Ambulatory Visit
Admit: 2023-06-13 | Discharge: 2023-06-14 | Payer: Medicare (Managed Care) | Attending: Student in an Organized Health Care Education/Training Program | Primary: Student in an Organized Health Care Education/Training Program

## 2023-06-18 ENCOUNTER — Ambulatory Visit: Admit: 2023-06-18 | Discharge: 2023-06-19 | Payer: Medicare (Managed Care)

## 2023-06-18 ENCOUNTER — Inpatient Hospital Stay: Admit: 2023-06-18 | Discharge: 2023-06-19 | Payer: Medicare (Managed Care)

## 2023-06-18 DIAGNOSIS — S8992XA Unspecified injury of left lower leg, initial encounter: Principal | ICD-10-CM

## 2023-06-27 DIAGNOSIS — R29898 Other symptoms and signs involving the musculoskeletal system: Principal | ICD-10-CM

## 2023-06-27 DIAGNOSIS — R200 Anesthesia of skin: Principal | ICD-10-CM

## 2023-06-27 DIAGNOSIS — G5623 Lesion of ulnar nerve, bilateral upper limbs: Principal | ICD-10-CM

## 2023-06-27 DIAGNOSIS — G5603 Carpal tunnel syndrome, bilateral upper limbs: Principal | ICD-10-CM

## 2023-07-02 ENCOUNTER — Ambulatory Visit
Admit: 2023-07-02 | Discharge: 2023-07-03 | Payer: Medicare (Managed Care) | Attending: Family Medicine | Primary: Family Medicine

## 2023-07-02 DIAGNOSIS — Z6830 Body mass index (BMI) 30.0-30.9, adult: Principal | ICD-10-CM

## 2023-07-02 DIAGNOSIS — I10 Essential (primary) hypertension: Principal | ICD-10-CM

## 2023-07-02 DIAGNOSIS — E1042 Type 1 diabetes mellitus with diabetic polyneuropathy: Principal | ICD-10-CM

## 2023-07-02 DIAGNOSIS — E66811 Class 1 obesity with serious comorbidity and body mass index (BMI) of 30.0 to 30.9 in adult, unspecified obesity type: Principal | ICD-10-CM

## 2023-07-02 DIAGNOSIS — E559 Vitamin D deficiency, unspecified: Principal | ICD-10-CM

## 2023-07-02 MED ORDER — OZEMPIC 2 MG/DOSE (8 MG/3 ML) SUBCUTANEOUS PEN INJECTOR
SUBCUTANEOUS | 2 refills | 28.00000 days | Status: CP
Start: 2023-07-02 — End: ?

## 2023-07-04 MED ORDER — LISINOPRIL 10 MG TABLET
ORAL_TABLET | Freq: Every day | ORAL | 3 refills | 90.00000 days | Status: CP
Start: 2023-07-04 — End: ?

## 2023-07-04 MED ORDER — ATORVASTATIN 40 MG TABLET
ORAL_TABLET | Freq: Every day | ORAL | 3 refills | 90.00000 days | Status: CP
Start: 2023-07-04 — End: ?

## 2023-07-05 ENCOUNTER — Ambulatory Visit
Admit: 2023-07-05 | Discharge: 2023-07-06 | Payer: Medicare (Managed Care) | Attending: Orthopaedic Surgery | Primary: Orthopaedic Surgery

## 2023-07-09 DIAGNOSIS — D631 Anemia in chronic kidney disease: Principal | ICD-10-CM

## 2023-07-09 DIAGNOSIS — Z9483 Pancreas transplant status: Principal | ICD-10-CM

## 2023-07-09 DIAGNOSIS — N1831 Anemia in stage 3a chronic kidney disease (CMS-HCC): Principal | ICD-10-CM

## 2023-07-09 DIAGNOSIS — Z79899 Other long term (current) drug therapy: Principal | ICD-10-CM

## 2023-07-09 DIAGNOSIS — Z940 Kidney transplant status: Principal | ICD-10-CM

## 2023-07-10 ENCOUNTER — Ambulatory Visit: Admit: 2023-07-10 | Payer: Medicare (Managed Care)

## 2023-07-11 ENCOUNTER — Ambulatory Visit: Admit: 2023-07-11 | Discharge: 2023-07-12 | Payer: Medicare (Managed Care)

## 2023-07-11 DIAGNOSIS — Z9483 Pancreas transplant status: Principal | ICD-10-CM

## 2023-07-11 DIAGNOSIS — N1831 Anemia in stage 3a chronic kidney disease (CMS-HCC): Principal | ICD-10-CM

## 2023-07-11 DIAGNOSIS — Z940 Kidney transplant status: Principal | ICD-10-CM

## 2023-07-11 DIAGNOSIS — D849 Immunodeficiency, unspecified: Principal | ICD-10-CM

## 2023-07-11 DIAGNOSIS — D631 Anemia in chronic kidney disease: Principal | ICD-10-CM

## 2023-07-11 DIAGNOSIS — Z79899 Other long term (current) drug therapy: Principal | ICD-10-CM

## 2023-07-16 DIAGNOSIS — Z9483 Pancreas transplant status: Principal | ICD-10-CM

## 2023-07-16 DIAGNOSIS — Z940 Kidney transplant status: Principal | ICD-10-CM

## 2023-07-16 MED ORDER — TACROLIMUS 1 MG CAPSULE, IMMEDIATE-RELEASE
ORAL_CAPSULE | Freq: Two times a day (BID) | ORAL | 2 refills | 30.00000 days
Start: 2023-07-16 — End: 2023-10-14

## 2023-07-19 MED ORDER — TACROLIMUS 1 MG CAPSULE, IMMEDIATE-RELEASE
ORAL_CAPSULE | Freq: Two times a day (BID) | ORAL | 2 refills | 30.00000 days
Start: 2023-07-19 — End: 2023-10-17

## 2023-07-23 ENCOUNTER — Ambulatory Visit: Admit: 2023-07-23 | Discharge: 2023-07-24 | Payer: Medicare (Managed Care) | Attending: Podiatrist | Primary: Podiatrist

## 2023-07-23 DIAGNOSIS — E1142 Type 2 diabetes mellitus with diabetic polyneuropathy: Principal | ICD-10-CM

## 2023-07-23 DIAGNOSIS — E1042 Type 1 diabetes mellitus with diabetic polyneuropathy: Principal | ICD-10-CM

## 2023-07-23 DIAGNOSIS — Z940 Kidney transplant status: Principal | ICD-10-CM

## 2023-07-23 DIAGNOSIS — M14671 Charcot's joint, right ankle and foot: Principal | ICD-10-CM

## 2023-07-23 DIAGNOSIS — M869 Osteomyelitis, unspecified: Principal | ICD-10-CM

## 2023-07-30 DIAGNOSIS — Z9483 Pancreas transplant status: Principal | ICD-10-CM

## 2023-07-30 DIAGNOSIS — Z940 Kidney transplant status: Principal | ICD-10-CM

## 2023-07-30 MED ORDER — TACROLIMUS 1 MG CAPSULE, IMMEDIATE-RELEASE
ORAL_CAPSULE | Freq: Two times a day (BID) | ORAL | 11 refills | 30.00000 days | Status: CP
Start: 2023-07-30 — End: 2023-10-28
  Filled 2023-08-06: qty 180, 30d supply, fill #0

## 2023-08-03 ENCOUNTER — Inpatient Hospital Stay
Admit: 2023-08-03 | Discharge: 2023-08-03 | Disposition: A | Discharge status: Home / Self Care | Payer: Medicare (Managed Care) | Attending: Neurology | Admitting: Neurology

## 2023-08-03 ENCOUNTER — Emergency Department: Payer: Medicare (Managed Care)

## 2023-08-03 ENCOUNTER — Inpatient Hospital Stay
Admission: EM | Admit: 2023-08-03 | Discharge: 2023-08-05 | DRG: 062 | Disposition: A | Discharge status: Home / Self Care | Payer: Medicare (Managed Care) | Attending: Obstetrics and Gynecology | Admitting: Obstetrics and Gynecology

## 2023-08-03 ENCOUNTER — Other Ambulatory Visit: Payer: Self-pay

## 2023-08-03 DIAGNOSIS — M14671 Charcot's joint, right ankle and foot: Secondary | ICD-10-CM | POA: Diagnosis present

## 2023-08-03 DIAGNOSIS — Z833 Family history of diabetes mellitus: Secondary | ICD-10-CM

## 2023-08-03 DIAGNOSIS — R414 Neurologic neglect syndrome: Secondary | ICD-10-CM | POA: Diagnosis present

## 2023-08-03 DIAGNOSIS — Z89421 Acquired absence of other right toe(s): Secondary | ICD-10-CM

## 2023-08-03 DIAGNOSIS — G460 Middle cerebral artery syndrome: Secondary | ICD-10-CM | POA: Diagnosis present

## 2023-08-03 DIAGNOSIS — N1831 Chronic kidney disease, stage 3a: Secondary | ICD-10-CM | POA: Diagnosis present

## 2023-08-03 DIAGNOSIS — E785 Hyperlipidemia, unspecified: Secondary | ICD-10-CM | POA: Diagnosis present

## 2023-08-03 DIAGNOSIS — R270 Ataxia, unspecified: Secondary | ICD-10-CM | POA: Diagnosis present

## 2023-08-03 DIAGNOSIS — R29702 NIHSS score 2: Secondary | ICD-10-CM | POA: Diagnosis present

## 2023-08-03 DIAGNOSIS — Z794 Long term (current) use of insulin: Secondary | ICD-10-CM

## 2023-08-03 DIAGNOSIS — R4701 Aphasia: Secondary | ICD-10-CM | POA: Diagnosis present

## 2023-08-03 DIAGNOSIS — E1051 Type 1 diabetes mellitus with diabetic peripheral angiopathy without gangrene: Secondary | ICD-10-CM | POA: Diagnosis present

## 2023-08-03 DIAGNOSIS — R29703 NIHSS score 3: Secondary | ICD-10-CM

## 2023-08-03 DIAGNOSIS — G8191 Hemiplegia, unspecified affecting right dominant side: Secondary | ICD-10-CM | POA: Diagnosis present

## 2023-08-03 DIAGNOSIS — E119 Type 2 diabetes mellitus without complications: Secondary | ICD-10-CM

## 2023-08-03 DIAGNOSIS — E1022 Type 1 diabetes mellitus with diabetic chronic kidney disease: Secondary | ICD-10-CM | POA: Diagnosis present

## 2023-08-03 DIAGNOSIS — T8619 Other complication of kidney transplant: Secondary | ICD-10-CM | POA: Diagnosis present

## 2023-08-03 DIAGNOSIS — Z8673 Personal history of transient ischemic attack (TIA), and cerebral infarction without residual deficits: Secondary | ICD-10-CM

## 2023-08-03 DIAGNOSIS — M86671 Other chronic osteomyelitis, right ankle and foot: Secondary | ICD-10-CM | POA: Diagnosis present

## 2023-08-03 DIAGNOSIS — E109 Type 1 diabetes mellitus without complications: Secondary | ICD-10-CM | POA: Insufficient documentation

## 2023-08-03 DIAGNOSIS — R791 Abnormal coagulation profile: Secondary | ICD-10-CM | POA: Diagnosis not present

## 2023-08-03 DIAGNOSIS — I129 Hypertensive chronic kidney disease with stage 1 through stage 4 chronic kidney disease, or unspecified chronic kidney disease: Secondary | ICD-10-CM | POA: Diagnosis present

## 2023-08-03 DIAGNOSIS — I639 Cerebral infarction, unspecified: Principal | ICD-10-CM | POA: Diagnosis present

## 2023-08-03 DIAGNOSIS — Z881 Allergy status to other antibiotic agents status: Secondary | ICD-10-CM

## 2023-08-03 DIAGNOSIS — E1069 Type 1 diabetes mellitus with other specified complication: Secondary | ICD-10-CM | POA: Diagnosis present

## 2023-08-03 DIAGNOSIS — Z809 Family history of malignant neoplasm, unspecified: Secondary | ICD-10-CM

## 2023-08-03 DIAGNOSIS — Z79899 Other long term (current) drug therapy: Secondary | ICD-10-CM

## 2023-08-03 DIAGNOSIS — I252 Old myocardial infarction: Secondary | ICD-10-CM

## 2023-08-03 DIAGNOSIS — Z940 Kidney transplant status: Secondary | ICD-10-CM

## 2023-08-03 DIAGNOSIS — N179 Acute kidney failure, unspecified: Secondary | ICD-10-CM | POA: Diagnosis present

## 2023-08-03 DIAGNOSIS — Z7902 Long term (current) use of antithrombotics/antiplatelets: Secondary | ICD-10-CM

## 2023-08-03 DIAGNOSIS — Z7982 Long term (current) use of aspirin: Secondary | ICD-10-CM | POA: Diagnosis not present

## 2023-08-03 DIAGNOSIS — I739 Peripheral vascular disease, unspecified: Secondary | ICD-10-CM | POA: Insufficient documentation

## 2023-08-03 DIAGNOSIS — I10 Essential (primary) hypertension: Secondary | ICD-10-CM | POA: Diagnosis present

## 2023-08-03 DIAGNOSIS — Z79621 Long term (current) use of calcineurin inhibitor: Secondary | ICD-10-CM

## 2023-08-03 DIAGNOSIS — N1832 Chronic kidney disease, stage 3b: Secondary | ICD-10-CM | POA: Diagnosis present

## 2023-08-03 DIAGNOSIS — D631 Anemia in chronic kidney disease: Secondary | ICD-10-CM | POA: Diagnosis present

## 2023-08-03 DIAGNOSIS — Z885 Allergy status to narcotic agent status: Secondary | ICD-10-CM

## 2023-08-03 DIAGNOSIS — Y830 Surgical operation with transplant of whole organ as the cause of abnormal reaction of the patient, or of later complication, without mention of misadventure at the time of the procedure: Secondary | ICD-10-CM | POA: Diagnosis present

## 2023-08-03 DIAGNOSIS — Z9483 Pancreas transplant status: Secondary | ICD-10-CM | POA: Diagnosis not present

## 2023-08-03 DIAGNOSIS — K219 Gastro-esophageal reflux disease without esophagitis: Secondary | ICD-10-CM | POA: Diagnosis present

## 2023-08-03 DIAGNOSIS — Z79624 Long term (current) use of inhibitors of nucleotide synthesis: Secondary | ICD-10-CM

## 2023-08-03 LAB — DIFFERENTIAL
Abs Immature Granulocytes: 0.04 10*3/uL (ref 0.00–0.07)
Basophils Absolute: 0 10*3/uL (ref 0.0–0.1)
Basophils Relative: 1 %
Eosinophils Absolute: 0.1 10*3/uL (ref 0.0–0.5)
Eosinophils Relative: 1 %
Immature Granulocytes: 1 %
Lymphocytes Relative: 35 %
Lymphs Abs: 2.8 10*3/uL (ref 0.7–4.0)
Monocytes Absolute: 0.8 10*3/uL (ref 0.1–1.0)
Monocytes Relative: 9 %
Neutro Abs: 4.4 10*3/uL (ref 1.7–7.7)
Neutrophils Relative %: 53 %

## 2023-08-03 LAB — GLUCOSE, CAPILLARY
Glucose-Capillary: 122 mg/dL — ABNORMAL HIGH (ref 70–99)
Glucose-Capillary: 86 mg/dL (ref 70–99)

## 2023-08-03 LAB — CBC
HCT: 31.4 % — ABNORMAL LOW (ref 36.0–46.0)
Hemoglobin: 9.9 g/dL — ABNORMAL LOW (ref 12.0–15.0)
MCH: 29.2 pg (ref 26.0–34.0)
MCHC: 31.5 g/dL (ref 30.0–36.0)
MCV: 92.6 fL (ref 80.0–100.0)
Platelets: 155 10*3/uL (ref 150–400)
RBC: 3.39 MIL/uL — ABNORMAL LOW (ref 3.87–5.11)
RDW: 15.9 % — ABNORMAL HIGH (ref 11.5–15.5)
WBC: 8.1 10*3/uL (ref 4.0–10.5)
nRBC: 0 % (ref 0.0–0.2)

## 2023-08-03 LAB — APTT: aPTT: 176 s (ref 24–36)

## 2023-08-03 LAB — COMPREHENSIVE METABOLIC PANEL WITH GFR
ALT: 24 U/L (ref 0–44)
AST: 17 U/L (ref 15–41)
Albumin: 3.1 g/dL — ABNORMAL LOW (ref 3.5–5.0)
Alkaline Phosphatase: 52 U/L (ref 38–126)
Anion gap: 7 (ref 5–15)
BUN: 40 mg/dL — ABNORMAL HIGH (ref 6–20)
CO2: 16 mmol/L — ABNORMAL LOW (ref 22–32)
Calcium: 8.5 mg/dL — ABNORMAL LOW (ref 8.9–10.3)
Chloride: 115 mmol/L — ABNORMAL HIGH (ref 98–111)
Creatinine, Ser: 2.52 mg/dL — ABNORMAL HIGH (ref 0.44–1.00)
GFR, Estimated: 22 mL/min — ABNORMAL LOW (ref >60)
Glucose, Bld: 100 mg/dL — ABNORMAL HIGH (ref 70–99)
Potassium: 3.7 mmol/L (ref 3.5–5.1)
Sodium: 138 mmol/L (ref 135–145)
Total Bilirubin: 1 mg/dL (ref 0.0–1.2)
Total Protein: 5.8 g/dL — ABNORMAL LOW (ref 6.5–8.1)

## 2023-08-03 LAB — PROTIME-INR
INR: 1.1 (ref 0.8–1.2)
Prothrombin Time: 14.4 s (ref 11.4–15.2)

## 2023-08-03 LAB — ETHANOL: Alcohol, Ethyl (B): <15 mg/dL (ref <15)

## 2023-08-03 LAB — CBG MONITORING, ED: Glucose-Capillary: 90 mg/dL (ref 70–99)

## 2023-08-03 LAB — MRSA NEXT GEN BY PCR, NASAL: MRSA by PCR Next Gen: NOT DETECTED

## 2023-08-03 LAB — CK: Total CK: 38 U/L (ref 38–234)

## 2023-08-03 MED ORDER — TENECTEPLASE FOR STROKE
0.2500 mg/kg | PACK | Freq: Once | INTRAVENOUS | Status: AC
  Administered 2023-08-03: 19 mg via INTRAVENOUS

## 2023-08-03 MED ORDER — PREDNISONE 10 MG PO TABS
5.0000 mg | ORAL_TABLET | Freq: Every day | ORAL | Status: DC
  Administered 2023-08-03 – 2023-08-05 (×3): 5 mg via ORAL
  Filled 2023-08-03 (×3): qty 1

## 2023-08-03 MED ORDER — AMITRIPTYLINE HCL 25 MG PO TABS: 25.0000 mg | ORAL_TABLET | Freq: Two times a day (BID) | ORAL | Status: DC

## 2023-08-03 MED ORDER — SENNOSIDES-DOCUSATE SODIUM 8.6-50 MG PO TABS
1.0000 | ORAL_TABLET | Freq: Every evening | ORAL | Status: DC | PRN
Start: 2023-08-03 — End: 2023-08-05

## 2023-08-03 MED ORDER — TACROLIMUS 1 MG PO CAPS
3.0000 mg | ORAL_CAPSULE | Freq: Two times a day (BID) | ORAL | Status: DC
  Administered 2023-08-03 – 2023-08-05 (×4): 3 mg via ORAL
  Filled 2023-08-03 (×4): qty 3

## 2023-08-03 MED ORDER — CHLORHEXIDINE GLUCONATE CLOTH 2 % EX PADS
6.0000 | MEDICATED_PAD | Freq: Every day | CUTANEOUS | Status: DC
  Administered 2023-08-04 – 2023-08-05 (×2): 6 via TOPICAL

## 2023-08-03 MED ORDER — SODIUM CHLORIDE 0.9% FLUSH
3.0000 mL | Freq: Once | INTRAVENOUS | Status: AC
  Administered 2023-08-03: 3 mL via INTRAVENOUS

## 2023-08-03 MED ORDER — ACETAMINOPHEN 160 MG/5ML PO SOLN: 650.0000 mg | ORAL | Status: DC | PRN

## 2023-08-03 MED ORDER — SODIUM CHLORIDE 0.9 % IV SOLN: INTRAVENOUS | Status: AC

## 2023-08-03 MED ORDER — ATORVASTATIN CALCIUM 20 MG PO TABS
40.0000 mg | ORAL_TABLET | Freq: Every day | ORAL | Status: DC
  Administered 2023-08-03 – 2023-08-04 (×2): 40 mg via ORAL
  Filled 2023-08-03 (×2): qty 2

## 2023-08-03 MED ORDER — ACETAMINOPHEN 325 MG PO TABS
650.0000 mg | ORAL_TABLET | ORAL | Status: DC | PRN
Start: 2023-08-03 — End: 2023-08-05
  Administered 2023-08-03 – 2023-08-04 (×2): 650 mg via ORAL
  Filled 2023-08-03 (×2): qty 2

## 2023-08-03 MED ORDER — LABETALOL HCL 5 MG/ML IV SOLN
INTRAVENOUS | Status: AC
  Filled 2023-08-03: qty 4

## 2023-08-03 MED ORDER — STROKE: EARLY STAGES OF RECOVERY BOOK: Freq: Once | Status: AC

## 2023-08-03 MED ORDER — PANTOPRAZOLE SODIUM 40 MG PO TBEC: 40.0000 mg | DELAYED_RELEASE_TABLET | Freq: Every day | ORAL | Status: DC

## 2023-08-03 MED ORDER — MYCOPHENOLATE SODIUM 180 MG PO TBEC
360.0000 mg | DELAYED_RELEASE_TABLET | Freq: Two times a day (BID) | ORAL | Status: DC
  Administered 2023-08-03 – 2023-08-05 (×4): 360 mg via ORAL
  Filled 2023-08-03 (×4): qty 2

## 2023-08-03 MED ORDER — PANTOPRAZOLE SODIUM 40 MG IV SOLR
40.0000 mg | Freq: Every day | INTRAVENOUS | Status: DC
  Administered 2023-08-03 – 2023-08-04 (×2): 40 mg via INTRAVENOUS
  Filled 2023-08-03 (×2): qty 10

## 2023-08-03 MED ORDER — HYDRALAZINE HCL 20 MG/ML IJ SOLN: 10.0000 mg | INTRAMUSCULAR | Status: DC | PRN

## 2023-08-03 MED ORDER — CARVEDILOL 12.5 MG PO TABS
12.5000 mg | ORAL_TABLET | Freq: Two times a day (BID) | ORAL | Status: DC
  Administered 2023-08-04: 12.5 mg via ORAL
  Filled 2023-08-03: qty 1

## 2023-08-03 MED ORDER — INSULIN ASPART 100 UNIT/ML IJ SOLN: 0.0000 [IU] | INTRAMUSCULAR | Status: DC

## 2023-08-03 MED ORDER — LABETALOL HCL 5 MG/ML IV SOLN
10.0000 mg | INTRAVENOUS | Status: DC | PRN
  Filled 2023-08-03: qty 4

## 2023-08-03 MED ORDER — POLYETHYLENE GLYCOL 3350 17 G PO PACK
17.0000 g | PACK | Freq: Every day | ORAL | Status: DC | PRN
Start: 2023-08-03 — End: 2023-08-05

## 2023-08-03 MED ORDER — LORAZEPAM 2 MG/ML IJ SOLN: 2.0000 mg | Freq: Once | INTRAMUSCULAR | Status: DC | PRN

## 2023-08-03 MED ORDER — ACETAMINOPHEN 650 MG RE SUPP: 650.0000 mg | RECTAL | Status: DC | PRN

## 2023-08-03 MED ORDER — LISINOPRIL 5 MG PO TABS
10.0000 mg | ORAL_TABLET | Freq: Every day | ORAL | Status: DC
  Administered 2023-08-03: 10 mg via ORAL
  Filled 2023-08-03: qty 2

## 2023-08-03 MED ORDER — ALBUTEROL SULFATE (2.5 MG/3ML) 0.083% IN NEBU: 2.5000 mg | INHALATION_SOLUTION | Freq: Four times a day (QID) | RESPIRATORY_TRACT | Status: DC | PRN

## 2023-08-03 MED ORDER — AMLODIPINE BESYLATE 5 MG PO TABS: 5.0000 mg | ORAL_TABLET | Freq: Every day | ORAL | Status: DC

## 2023-08-03 NOTE — ED Provider Notes (Signed)
 [facility] Provider Note    Event Date/Time   First MD Initiated Contact with Patient 08/03/23 1336     (approximate)   History   Chief Complaint Code Stroke   HPI  Lolitha Tortora is a 53 y.o. female with past medical history of hypertension, diabetes, and renal transplant who presents to the ED complaining of code stroke.  Per EMS, patient was talking with a friend around 1 PM when she suddenly seemed to have some difficulty finding words with weakness on the right side of her body.  Per EMS, patient had waxing and waning weakness in her right side during transport, did seem to consistently have difficulty speaking.  She was also noted to have difficulty walking when EMS tried to stand her up to get her to the stretcher.  She does not take any blood thinners.  Code stroke was activated prior to arrival.     Physical Exam   Triage Vital Signs: ED Triage Vitals  Encounter Vitals Group     BP      Systolic BP Percentile      Diastolic BP Percentile      Pulse      Resp      Temp      Temp src      SpO2      Weight      Height      Head Circumference      Peak Flow      Pain Score      Pain Loc      Pain Education      Exclude from Growth Chart     Most recent vital signs: Vitals:   08/03/23 1404 08/03/23 1416  BP: (!) 174/85 (!) 160/79  Pulse: 90 90  Resp: 16 16  Temp: 98.4 F (36.9 C)   SpO2: 98% 100%    Constitutional: Alert and oriented. Eyes: Conjunctivae are normal. Head: Atraumatic. Nose: No congestion/rhinnorhea. Mouth/Throat: Mucous membranes are moist.  Cardiovascular: Normal rate, regular rhythm. Grossly normal heart sounds.  2+ radial pulses bilaterally. Respiratory: Normal respiratory effort.  No retractions. Lungs CTAB. Gastrointestinal: Soft and nontender. No distention. Musculoskeletal: No lower extremity tenderness nor edema.  Neurologic: Expressive aphasia noted.  Right-sided neglect noted with no focal  weakness.   ED Results / Procedures / Treatments   Labs (all labs ordered are listed, but only abnormal results are displayed) Labs Reviewed  CBC - Abnormal; Notable for the following components:      Result Value   RBC 3.39 (*)    Hemoglobin 9.9 (*)    HCT 31.4 (*)    RDW 15.9 (*)    All other components within normal limits  COMPREHENSIVE METABOLIC PANEL WITH GFR - Abnormal; Notable for the following components:   Chloride 115 (*)    CO2 16 (*)    Glucose, Bld 100 (*)    BUN 40 (*)    Creatinine, Ser 2.52 (*)    Calcium  8.5 (*)    Total Protein 5.8 (*)    Albumin  3.1 (*)    GFR, Estimated 22 (*)    All other components within normal limits  DIFFERENTIAL  PROTIME-INR  APTT  ETHANOL  HIV ANTIBODY (ROUTINE TESTING W REFLEX)  HEMOGLOBIN A1C  CBG MONITORING, ED  CBG MONITORING, ED  POC URINE PREG, ED     EKG  ED ECG REPORT I, Twilla Galea, the attending physician, personally viewed and interpreted this ECG.  Date: 08/03/2023  EKG Time: 14:04  Rate: 91  Rhythm: normal sinus rhythm  Axis: Normal  Intervals:right bundle branch block  ST&T Change: None  RADIOLOGY CT head reviewed and interpreted by me with no hemorrhage or midline shift.  PROCEDURES:  Critical Care performed: Yes, see critical care procedure note(s)  .Critical Care  Performed by: Twilla Galea, MD Authorized by: Twilla Galea, MD   Critical care provider statement:    Critical care time (minutes):  30   Critical care time was exclusive of:  Separately billable procedures and treating other patients and teaching time   Critical care was necessary to treat or prevent imminent or life-threatening deterioration of the following conditions:  CNS failure or compromise   Critical care was time spent personally by me on the following activities:  Development of treatment plan with patient or surrogate, discussions with consultants, evaluation of patient's response to treatment, examination of  patient, ordering and review of laboratory studies, ordering and review of radiographic studies, ordering and performing treatments and interventions, pulse oximetry, re-evaluation of patient's condition and review of old charts   I assumed direction of critical care for this patient from another provider in my specialty: no     Care discussed with: admitting provider      MEDICATIONS ORDERED IN ED: Medications  labetalol (NORMODYNE) 5 MG/ML injection (has no administration in time range)  labetalol (NORMODYNE) injection 10 mg (has no administration in time range)  hydrALAZINE (APRESOLINE) injection 10 mg (has no administration in time range)   stroke: early stages of recovery book (has no administration in time range)  acetaminophen  (TYLENOL ) tablet 650 mg (has no administration in time range)    Or  acetaminophen  (TYLENOL ) 160 MG/5ML solution 650 mg (has no administration in time range)    Or  acetaminophen  (TYLENOL ) suppository 650 mg (has no administration in time range)  senna-docusate (Senokot-S) tablet 1 tablet (has no administration in time range)  pantoprazole  (PROTONIX ) injection 40 mg (has no administration in time range)  sodium chloride  flush (NS) 0.9 % injection 3 mL (3 mLs Intravenous Given 08/03/23 1414)  tenecteplase (TNKASE) injection for Stroke 19 mg (19 mg Intravenous Given 08/03/23 1401)     IMPRESSION / MDM / ASSESSMENT AND PLAN / ED COURSE  I reviewed the triage vital signs and the nursing notes.                              53 y.o. female with past medical history of hypertension, diabetes, and renal transplant who presents to the ED complaining of acute onset right-sided weakness and aphasia.  Patient's presentation is most consistent with acute presentation with potential threat to life or bodily function.  Differential diagnosis includes, but is not limited to, stroke, TIA, hypoglycemia, anemia, electrolyte abnormality, AKI, seizure.  Patient awake and alert  on arrival, seems to have expressive aphasia with some mild right-sided neglect but no focal weakness noted.  Code stroke was activated prior to arrival, CT head performed and negative for acute process.  Dr. Cleone Dad of neurology at the bedside on patient arrival, decision was made to proceed with TNK administration following negative CT head.  Aphasia seems to be improving, patient no longer with right-sided neglect on reassessment.  Overall, low suspicion for LVO and Dr. Cleone Dad recommends holding off on CTA, has placed orders for MRI and MRA of the brain for tomorrow.  Labs show AKI without acute electrolyte abnormality,  mild anemia noted with no significant leukocytosis, LFTs unremarkable.  Case discussed with ICU team for admission.      FINAL CLINICAL IMPRESSION(S) / ED DIAGNOSES   Final diagnoses:  Acute ischemic stroke [facility])  Aphasia     Rx / DC Orders   ED Discharge Orders     None        Note:  This document was prepared using Dragon voice recognition software and may include unintentional dictation errors.   Twilla Galea, MD 08/03/23 [phone]

## 2023-08-03 NOTE — Consult Note (Addendum)
 NEUROLOGY CONSULT NOTE   Date of service: August 03, 2023 Patient Name: Kendra Schroeder MRN:  [id number] DOB:  04-24-1970 Chief Complaint: "Right sided weakness " Requesting Provider: Twilla Galea, MD  History of Present Illness  Kendra Schroeder is a 53 y.o. female with hx of diabetes, hypertension, hyperlipidemia, CKD stage III, renal and pancreas transplant, MI, left lower extremity peripheral artery disease with rest pain  The speaking family normally and then suddenly had onset of right-sided weakness, ataxia.  She was completely flaccid on the right side, nonverbal on the initial EMS arrival but was rapidly improving.  However remained severely aphasic on arrival to ED with some mild right-sided neglect.  Multiple attempts to reach family were unsuccessful including at the number provided to EMS.  Eventually it was felt that her aphasia had cleared sufficiently to accurately answer TNK exclusion criteria and provide consent; chart review also revealed no contraindications to TNK.  However she still had a significant productive aphasia which given her young age was felt to be disabling in nature and decision was made to proceed with thrombolytic treatment  LKW: 1 PM Modified rankin score: 0 IV Thrombolysis: Yes @ 14:01 EVT: No, exam not consistent with intervenable LVO   NIHSS components Score: Comment  1a Level of Conscious 0[x]  1[]  2[]  3[]      1b LOC Questions 0[x]  1[]  2[]       1c LOC Commands 0[x]  1[]  2[]       2 Best Gaze 0[]  1[x]  2[]     Left gaze preference, resolving over the course of evaluation  3 Visual 0[x]  1[]  2[]  3[]      4 Facial Palsy 0[x]  1[]  2[]  3[]      5a Motor Arm - left 0[x]  1[]  2[]  3[]  4[]  UN[]    5b Motor Arm - Right 0[x]  1[]  2[]  3[]  4[]  UN[]    6a Motor Leg - Left 0[x]  1[]  2[]  3[]  4[]  UN[]    6b Motor Leg - Right 0[x]  1[]  2[]  3[]  4[]  UN[]    7 Limb Ataxia 0[x]  1[]  2[]  UN[]      8 Sensory 0[x]  1[]  2[]  UN[]      9 Best Language 0[]  1[x]  2[]  3[]    Patient agreed that  this was a disabling deficit   10 Dysarthria 0[x]  1[]  2[]  UN[]      11 Extinct. and Inattention 0[]  1[x]  2[]     Difficulty attending to the right side  TOTAL: 3       ROS  Limited by aphasia but patient answered TNK checklist, denied any recent fevers or other concerns  Past History   Past Medical History:  Diagnosis Date   CKD (chronic kidney disease), stage III (HCC)    Diabetes mellitus without complication (HCC)    GERD (gastroesophageal reflux disease)    Hypertension    Kidney transplant recipient    Myocardial infarct [facility])     Past Surgical History:  Procedure Laterality Date   CARPAL TUNNEL RELEASE Left    COMBINED KIDNEY-PANCREAS TRANSPLANT     EYE SURGERY     NEPHRECTOMY TRANSPLANTED ORGAN     TOE AMPUTATION      Family History: Family History  Problem Relation Age of Onset   Diabetes Unknown    Cancer Unknown     Social History  reports that she has never smoked. She has never used smokeless tobacco. She reports that she does not drink alcohol and does not use drugs.  Allergies  Allergen Reactions   Cefepime Other (See Comments)  Altered mental status, aphasia   Percocet [Oxycodone -Acetaminophen ] Other (See Comments)    Confusion, dizziness    Medications   Current Facility-Administered Medications:    sodium chloride  flush (NS) 0.9 % injection 3 mL, 3 mL, Intravenous, Once, Twilla Galea, MD  Current Outpatient Medications:    albuterol  (PROVENTIL  HFA;VENTOLIN  HFA) 108 (90 BASE) MCG/ACT inhaler, Inhale 2 puffs into the lungs every 6 (six) hours as needed for wheezing or shortness of breath., Disp: 1 Inhaler, Rfl: 2   amitriptyline  (ELAVIL ) 25 MG tablet, Take 25 mg by mouth 2 (two) times daily. (Patient not taking: Reported on 02/05/2022), Disp: , Rfl:    amLODipine  (NORVASC ) 5 MG tablet, Take 5 mg by mouth daily. (Patient not taking: Reported on 02/05/2022), Disp: , Rfl:    aspirin  81 MG EC tablet, Take 81 mg by mouth daily. , Disp: , Rfl:     atorvastatin  (LIPITOR) 10 MG tablet, Take 10 mg by mouth daily., Disp: , Rfl:    calcium -vitamin D  (OSCAL WITH D) 500-200 MG-UNIT tablet, Take 1 tablet by mouth daily., Disp: , Rfl:    carvedilol  (COREG ) 25 MG tablet, Take 25 mg by mouth 2 (two) times daily with a meal. (Patient not taking: Reported on 02/05/2022), Disp: , Rfl:    cefTAZidime  2 g in sodium chloride  0.9 % 100 mL, Inject 2 g into the vein every 12 (twelve) hours. (Patient not taking: Reported on 02/05/2022), Disp: , Rfl:    Difluprednate (DUREZOL) 0.05 % EMUL, Apply 1 drop to eye daily as needed., Disp: , Rfl:    doxycycline  (VIBRA -TABS) 100 MG tablet, Take 100 mg by mouth 2 (two) times daily. (Patient not taking: Reported on 02/05/2022), Disp: , Rfl:    furosemide  (LASIX ) 20 MG tablet, Take 20 mg by mouth daily., Disp: , Rfl:    HEPARIN  LOCK FLUSH IV, Inject 3 mLs into the vein daily. (Patient not taking: Reported on 02/05/2022), Disp: , Rfl:    lisinopril (ZESTRIL) 10 MG tablet, Take 10 mg by mouth daily., Disp: , Rfl:    moxifloxacin (VIGAMOX) 0.5 % ophthalmic solution, Place 1 drop into both eyes daily as needed., Disp: , Rfl:    Multiple Vitamin (MULTIVITAMIN) tablet, Take 1 tablet by mouth daily., Disp: , Rfl:    MYFORTIC  180 MG EC tablet, Take 540 mg by mouth 2 (two) times daily. (brand name only) (Patient not taking: Reported on 02/05/2022), Disp: , Rfl:    omeprazole (PRILOSEC) 40 MG capsule, Take 40 mg by mouth 2 (two) times daily., Disp: , Rfl:    polyethylene glycol (MIRALAX / GLYCOLAX) packet, Take 17 g by mouth daily as needed for mild constipation., Disp: , Rfl:    predniSONE  (DELTASONE ) 2.5 MG tablet, Take 5 mg by mouth daily., Disp: , Rfl:    PROGRAF  1 MG capsule, Take 2-3 mg by mouth 2 (two) times daily. 3mg  in the morning and 2mg  in the evening, Disp: , Rfl:    silver sulfADIAZINE (SILVADENE) 1 % cream, Apply 1 application topically daily., Disp: , Rfl:   Vitals   Vitals:   08/03/23 1356 08/03/23 1404 08/03/23  1405  BP:  (!) 174/85   Pulse:  90   Resp:  16   Temp:  98.4 F (36.9 C)   SpO2:  98%   Weight: 74 kg  74 kg  Height:   5\' 2"  (1.575 m)    Body mass index is 29.84 kg/m.   Physical Exam   Constitutional: Appears well-developed and well-nourished.  Psych: Affect somewhat flat, cooperative Eyes: No scleral injection.  HENT: No OP obstruction.  Head: Normocephalic.  Cardiovascular: Normal rate and regular rhythm.  Respiratory: Effort normal, non-labored breathing.  GI: Soft.  No distension. There is no tenderness.  Skin: WDI.  Port in place in the right upper chest, accessed without issue  Neurologic Examination    Mental Status: Patient is awake, alert, oriented to person, place, month, year, and situation. Patient is able to give limited history due to very slow/delayed speech. She has moderate aphasia on arrival, improving to mild but still disabling aphasia at the time of TNK administration Cranial Nerves: II: Visual Fields are full on repeat testing, and initial testing seem to have trouble locating stimuli on the right visual field. Pupils are postsurgical and ovid, small 2 mm bilaterally III,IV, VI: Left gaze preference initially but able to look fully towards the right with encouragement.  This resolved prior to TNK V: Facial sensation is symmetric to light touch VII: Facial movement is symmetric, mild right facial droop on spontaneous smile but symmetric on forced smile.  VIII: hearing is intact to voice XI: Shoulder shrug is symmetric. XII: tongue is midline without atrophy or fasciculations.  Motor: No drift in the bilateral upper or lower extremities Sensory: Sensation is symmetric to light touch and temperature in the arms and legs. Cerebellar: FNF and HKS are intact bilaterally Gait:  Deferred in acute setting    Labs/Imaging/Neurodiagnostic studies   CBC:  Recent Labs  Lab 08-12-23 1352  WBC 8.1  NEUTROABS 4.4  HGB 9.9*  HCT 31.4*  MCV 92.6   PLT 155   Basic Metabolic Panel:  Lab Results  Component Value Date   NA 141 12/02/2017   K 5.2 (H) 12/02/2017   CO2 23 12/02/2017   GLUCOSE 113 (H) 12/02/2017   BUN 19 12/02/2017   CREATININE 1.41 (H) 12/02/2017   CALCIUM  8.9 12/02/2017   GFRNONAA 43 (L) 12/02/2017   GFRAA 50 (L) 12/02/2017   Lipid Panel:  Lab Results  Component Value Date   LDLCALC 110 (H) 11/30/2014   HgbA1c:  Lab Results  Component Value Date   HGBA1C 5.4 04/02/2017   INR  Lab Results  Component Value Date   INR 1.10 01/25/2015    CT Head without contrast(Personally reviewed): No acute intracranial process  MRI Brain(to be personally reviewed), MRA head: Pending   ASSESSMENT   Kendra Schroeder is a 54 y.o. 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 for which she is s/p TNKase  RECOMMENDATIONS  # Stroke as determined by clinical assessment - Stroke labs HgbA1c, fasting lipid panel - MRI brain 24 hours post TNK (ordered) - Frequent neuro checks - Echocardiogram - Hold antiplatelets / chemo DVT PPX for 24 hours until post TNK head imaging completed  - SCDs  - Risk factor modification - Telemetry monitoring - Blood pressure goal   - Post TNK for 24  hours < 180/105 - PT consult, OT consult, Speech consult, unless patient is back to baseline - Stroke team to follow  ______________________________________________________________________  Baldwin Levee MD-PhD [facility] [phone]   CRITICAL CARE Performed by: Ronnette Coke   Total critical care time: 60 minutes  Critical care time was exclusive of separately billable procedures and treating other patients.  Critical care was necessary to treat or prevent imminent or life-threatening deterioration.  Critical care was time spent personally by me on the following activities: development of treatment  plan with patient and/or surrogate as well as nursing,  discussions with consultants, evaluation of patient's response to treatment, examination of patient, obtaining history from patient or surrogate, ordering and performing treatments and interventions, ordering and review of laboratory studies, ordering and review of radiographic studies, pulse oximetry and re-evaluation of patient's condition.

## 2023-08-03 NOTE — Consult Note (Signed)
 CODE STROKE- PHARMACY COMMUNICATION  Time CODE STROKE called/page received: 6/7 @ 1323  Time response to CODE STROKE was made (in person or via phone): in-person  Time Stroke Kit retrieved from [employer] (only if needed): TNK dosed for 74 kg, 3.8 mL or 19 mg  BP < 185 SBP LNW 1300 6/7 Given at 1401  Name of Provider/Nurse contacted: Dr. Baldwin Levee   Past Medical History:  Diagnosis Date   CKD (chronic kidney disease), stage III (HCC)    Diabetes mellitus without complication (HCC)    GERD (gastroesophageal reflux disease)    Hypertension    Kidney transplant recipient    Myocardial infarct [facility])    Prior to Admission medications   Medication Sig Start Date End Date Taking? Authorizing Provider  albuterol  (PROVENTIL  HFA;VENTOLIN  HFA) 108 (90 BASE) MCG/ACT inhaler Inhale 2 puffs into the lungs every 6 (six) hours as needed for wheezing or shortness of breath. 01/26/15   Konidena, Snehalatha, MD  amitriptyline  (ELAVIL ) 25 MG tablet Take 25 mg by mouth 2 (two) times daily. Patient not taking: Reported on 02/05/2022    [provider]  amLODipine  (NORVASC ) 5 MG tablet Take 5 mg by mouth daily. Patient not taking: Reported on 02/05/2022    [provider]  aspirin  81 MG EC tablet Take 81 mg by mouth daily.     [provider]  atorvastatin  (LIPITOR) 10 MG tablet Take 10 mg by mouth daily.    [provider]  calcium -vitamin D  (OSCAL WITH D) 500-200 MG-UNIT tablet Take 1 tablet by mouth daily.    [provider]  carvedilol  (COREG ) 25 MG tablet Take 25 mg by mouth 2 (two) times daily with a meal. Patient not taking: Reported on 02/05/2022    [provider]  cefTAZidime  2 g in sodium chloride  0.9 % 100 mL Inject 2 g into the vein every 12 (twelve) hours. Patient not taking: Reported on 02/05/2022    [provider]  Difluprednate (DUREZOL) 0.05 % EMUL Apply 1 drop to eye daily as needed.    [provider]   doxycycline  (VIBRA -TABS) 100 MG tablet Take 100 mg by mouth 2 (two) times daily. Patient not taking: Reported on 02/05/2022 11/26/17   [provider]  furosemide  (LASIX ) 20 MG tablet Take 20 mg by mouth daily.    [provider]  HEPARIN  LOCK FLUSH IV Inject 3 mLs into the vein daily. Patient not taking: Reported on 02/05/2022    [provider]  lisinopril (ZESTRIL) 10 MG tablet Take 10 mg by mouth daily.    [provider]  moxifloxacin (VIGAMOX) 0.5 % ophthalmic solution Place 1 drop into both eyes daily as needed.    [provider]  Multiple Vitamin (MULTIVITAMIN) tablet Take 1 tablet by mouth daily.    [provider]  MYFORTIC  180 MG EC tablet Take 540 mg by mouth 2 (two) times daily. (brand name only) Patient not taking: Reported on 02/05/2022    [provider]  omeprazole (PRILOSEC) 40 MG capsule Take 40 mg by mouth 2 (two) times daily.    [provider]  polyethylene glycol (MIRALAX / GLYCOLAX) packet Take 17 g by mouth daily as needed for mild constipation.    [provider]  predniSONE  (DELTASONE ) 2.5 MG tablet Take 5 mg by mouth daily.    [provider]  PROGRAF  1 MG capsule Take 2-3 mg by mouth 2 (two) times daily. 3mg  in the morning and 2mg  in the  evening    [provider]  silver sulfADIAZINE (SILVADENE) 1 % cream Apply 1 application topically daily.    [provider]   Craven Do, PharmD Pharmacy Resident  08/03/2023 2:35 PM

## 2023-08-03 NOTE — Progress Notes (Addendum)
 1329 elert for ems pre arrival 1332 page to [facility] neuro, Dr Cleone Dad waiting for patient arrival to assess 1332 patient arrived, being assessed by both edp and neuro  LKW approximately 1300 with waxing and waning right sided weakness, word finding difficulty and ataxia   1336 down to ct 1351 ncct read relayed to neuro 1357 back to room from ct 1358 Dr. Dorenda Gandy reviewing aphasia cards with patient   1401 TNK pushed, 19mg  1402 tnk order in   164/83 BP  TSRN will remain on screen for post tnk 30 minutes.

## 2023-08-03 NOTE — Plan of Care (Signed)
  Problem: Education: Goal: Knowledge of disease or condition will improve Outcome: Progressing Goal: Knowledge of secondary prevention will improve (MUST DOCUMENT ALL) Outcome: Progressing Goal: Knowledge of patient specific risk factors will improve (DELETE if not current risk factor) Outcome: Progressing   Problem: Ischemic Stroke/TIA Tissue Perfusion: Goal: Complications of ischemic stroke/TIA will be minimized Outcome: Progressing   Problem: Coping: Goal: Will verbalize positive feelings about self Outcome: Progressing

## 2023-08-03 NOTE — ED Triage Notes (Signed)
 Pt BIB [facility]  From Home due to new onset of R sided weakness, ataxia and expressive aphasia. LKW 1300.

## 2023-08-03 NOTE — Progress Notes (Addendum)
  Echocardiogram 2D Echocardiogram has been performed. A Saline Microcavitation (Bubble Study) was requested and performed on this study.  Nichola Barges 08/03/2023, 4:12 PM

## 2023-08-03 NOTE — H&P (Signed)
 Name: Kendra Schroeder MRN: [id number] DOB: 12/03/1970    LOS: 0  Admitting Provider:  Dr. Auston Left Reason for Admission:  Acute CVA S/P TNK  PULMONARY / CRITICAL CARE MEDICINE   Brief patient description: Patient presented with acute onset right sided weakness, ataxia, nonverbal on initial EMS arrival; ruled in for stroke based on clinical presentation and patient's age and administered TNK.  Patient admitted to the ICU s/p TNK.  HPI: This is a 53 year old with multiple stroke risk factors who presented with sudden onset right-sided flaccidity, ataxia and expressive aphasia.  Upon EMS arrival, patient was nonverbal and her right side was completely flaccid.  A code stroke was activated.  Upon arrival in the ED, patient remained flaccid on the right side with moderate expressive aphasia and persistent ataxia.  Her initial CT head was negative however based on her clinical presentation and multiple risk factors as well as her young age, TNK was administered.  Her symptoms have improved.  Her current NIH stroke score is 0.  She is being admitted to the ICU for further management.  Past Medical History:  Diagnosis Date   CKD (chronic kidney disease), stage III (HCC)    Diabetes mellitus without complication (HCC)    GERD (gastroesophageal reflux disease)    Hypertension    Kidney transplant recipient    Myocardial infarct [facility])    Past Surgical History:  Procedure Laterality Date   CARPAL TUNNEL RELEASE Left    COMBINED KIDNEY-PANCREAS TRANSPLANT     EYE SURGERY     NEPHRECTOMY TRANSPLANTED ORGAN     TOE AMPUTATION      Current Facility-Administered Medications:    [START ON 08/04/2023]  stroke: early stages of recovery book, , Does not apply, Once, Bhagat, Srishti L, MD   0.9 %  sodium chloride  infusion, , Intravenous, Continuous, Tukov-Yual, Erick Oxendine S, NP, Last Rate: 75 mL/hr at 08/03/23 2000, Infusion Verify at 08/03/23 2000   acetaminophen  (TYLENOL ) tablet 650 mg, 650 mg, Oral, Q4H  PRN, 650 mg at 08/03/23 1613 **OR** acetaminophen  (TYLENOL ) 160 MG/5ML solution 650 mg, 650 mg, Per Tube, Q4H PRN **OR** acetaminophen  (TYLENOL ) suppository 650 mg, 650 mg, Rectal, Q4H PRN, Bhagat, Srishti L, MD   albuterol  (PROVENTIL ) (2.5 MG/3ML) 0.083% nebulizer solution 2.5 mg, 2.5 mg, Inhalation, Q6H PRN, Tukov-Yual, Eevie Lapp S, NP   atorvastatin  (LIPITOR) tablet 40 mg, 40 mg, Oral, QHS, Tukov-Yual, Dermot Gremillion S, NP, 40 mg at 08/03/23 2102   [START ON 08/04/2023] carvedilol  (COREG ) tablet 12.5 mg, 12.5 mg, Oral, BID WC, Tukov-Yual, Taeden Geller S, NP   [START ON 08/04/2023] Chlorhexidine Gluconate Cloth 2 % PADS 6 each, 6 each, Topical, Q0600, Kasa, Kurian, MD   hydrALAZINE (APRESOLINE) injection 10 mg, 10 mg, Intravenous, Q4H PRN, Bhagat, Srishti L, MD   [START ON 08/04/2023] insulin aspart (novoLOG) injection 0-15 Units, 0-15 Units, Subcutaneous, Q4H, Tukov-Yual, Demetrious Rainford S, NP   labetalol (NORMODYNE) 5 MG/ML injection, , , ,    labetalol (NORMODYNE) injection 10 mg, 10 mg, Intravenous, Q2H PRN, Bhagat, Srishti L, MD   lisinopril (ZESTRIL) tablet 10 mg, 10 mg, Oral, Daily, Tukov-Yual, Mija Effertz S, NP, 10 mg at 08/03/23 2013   LORazepam (ATIVAN) injection 2 mg, 2 mg, Intravenous, Once PRN, Bhagat, Srishti L, MD   mycophenolate  (MYFORTIC ) EC tablet 360 mg, 360 mg, Oral, BID, Tukov-Yual, Lourine Alberico S, NP, 360 mg at 08/03/23 2103   pantoprazole  (PROTONIX ) injection 40 mg, 40 mg, Intravenous, QHS, Bhagat, Srishti L, MD, 40 mg at 08/03/23 2102   polyethylene glycol (  MIRALAX / GLYCOLAX) packet 17 g, 17 g, Oral, Daily PRN, Tukov-Yual, Warrene Kapfer S, NP   predniSONE  (DELTASONE ) tablet 5 mg, 5 mg, Oral, Daily, Tukov-Yual, Clytie Shetley S, NP, 5 mg at 08/03/23 2013   senna-docusate (Senokot-S) tablet 1 tablet, 1 tablet, Oral, QHS PRN, Bhagat, Srishti L, MD   tacrolimus  (PROGRAF ) capsule 3 mg, 3 mg, Oral, BID, Tukov-Yual, Dallys Nowakowski S, NP, 3 mg at 08/03/23 2103  Allergies Allergies  Allergen Reactions   Cefepime  Other (See Comments)    Altered mental status, aphasia   Percocet [Oxycodone -Acetaminophen ] Other (See Comments)    Confusion, dizziness    Family History Family History  Problem Relation Age of Onset   Diabetes Unknown    Cancer Unknown    Social History  reports that she has never smoked. She has never used smokeless tobacco. She reports that she does not drink alcohol and does not use drugs.  Review Of Systems:   Review of Systems  Constitutional:  Positive for malaise/fatigue. Negative for chills and fever.  HENT: Negative.    Eyes: Negative.   Respiratory:  Negative for cough, sputum production, shortness of breath and wheezing.   Cardiovascular: Negative.   Gastrointestinal: Negative.   Genitourinary: Negative.   Musculoskeletal: Negative.   Skin: Negative.   Neurological:  Positive for speech change (resolved post TNK), weakness and headaches. Negative for dizziness, seizures and loss of consciousness.  Endo/Heme/Allergies: Negative.   Psychiatric/Behavioral: Negative.       VITAL SIGNS: BP 122/71   Pulse 92   Temp 98.8 F (37.1 C)   Resp (!) 21   Ht 5\' 2"  (1.575 m)   Wt 72.2 kg   SpO2 93%   BMI 29.11 kg/m   HEMODYNAMICS:    VENTILATOR SETTINGS:    INTAKE / OUTPUT: No intake/output data recorded.  PHYSICAL EXAMINATION: General: Lying in bed, alert, pleasant, in no acute distress HEENT: Normocephalic and atraumatic, extraocular eye movements intact, PERRLA, no JVD Neuro: Intact, with no focal deficits, NIH stroke score 0. 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  LABS:  BMET Recent Labs  Lab 08/03/23 1352  NA 138  K 3.7  CL 115*  CO2 16*  BUN 40*  CREATININE 2.52*  GLUCOSE 100*    Electrolytes Recent Labs  Lab 08/03/23 1352  CALCIUM  8.5*    CBC Recent Labs  Lab 08/03/23 1352  WBC 8.1  HGB 9.9*  HCT 31.4*  PLT 155    Coag's Recent Labs  Lab 08/03/23 1352  APTT 176*  INR 1.1    Sepsis Markers No results for input(s): "LATICACIDVEN", "PROCALCITON", "O2SATVEN" in the last 168 hours.  ABG No results for input(s): "PHART", "PCO2ART", "PO2ART" in the last 168 hours.  Liver Enzymes Recent Labs  Lab 08/03/23 1352  AST 17  ALT 24  ALKPHOS 52  BILITOT 1.0  ALBUMIN  3.1*    Cardiac Enzymes No results for input(s): "TROPONINI", "PROBNP" in the last 168 hours.  Glucose Recent Labs  Lab 08/03/23 1334 08/03/23 1458  GLUCAP 90 86    Imaging CT HEAD CODE STROKE WO CONTRAST Result Date: 08/03/2023 CLINICAL DATA:  Code  stroke.  Neuro deficit, concern for stroke. EXAM: CT HEAD WITHOUT CONTRAST TECHNIQUE: Contiguous axial images were obtained from the base of the skull through the vertex without intravenous contrast. RADIATION DOSE REDUCTION: This exam was performed according to the departmental dose-optimization program which includes automated exposure control, adjustment of the mA and/or kV according to patient size and/or use of iterative reconstruction technique. COMPARISON:  CT head 09/23/2018. FINDINGS: Brain: No acute intracranial hemorrhage. No CT evidence of acute infarct. Nonspecific hypoattenuation in the periventricular and subcortical white matter favored to reflect chronic microvascular ischemic changes. No edema, mass effect, or midline shift. The basilar cisterns are patent. Ventricles: The ventricles are normal. Vascular: Atherosclerotic calcifications of the carotid siphons and intracranial vertebral arteries. No hyperdense vessel. Skull: No  acute or aggressive finding. Orbits: Orbits are symmetric. Sinuses: The visualized paranasal sinuses are clear. Other: Mastoid air cells are clear. ASPECTS [facility] Stroke Program Early CT Score) - Ganglionic level infarction (caudate, lentiform nuclei, internal capsule, insula, M1-M3 cortex): 7 - Supraganglionic infarction (M4-M6 cortex): 3 Total score (0-10 with 10 being normal): 10 IMPRESSION: 1. No CT evidence of acute intracranial abnormality. 2. Mild chronic microvascular ischemic changes. 3. ASPECTS is 10 These results were communicated to Dr. Cleone Dad at 1:51 pm on 08/03/2023 by text page via the [facility] messaging system. Electronically Signed   By: Denny Flack M.D.   On: 08/03/2023 13:51   STUDIES:  2D echo pending CULTURES: N/A  ANTIBIOTICS: N/A  SIGNIFICANT EVENTS: 08/03/2023: Admitted with acute CVA and given TNK  LINES/TUBES: PIVs  DISCUSSION: 53 year old African-American female with multiple comorbidities presenting with acute sudden onset right-sided paralysis, ataxia and aphasia; clinically ruled in for acute CVA based on symptoms and risk factors; now s/p TNK being admitted to the ICU for close monitoring.  ASSESSMENT / PLAN:  CARDIOVASCULAR A:  History of MI History of hypertension Hyperlipidemia associated with type 2 diabetes P:  Hemodynamic monitoring per ICU protocol Resume home blood pressure medications Blood pressure parameters per neurology Increase atorvastatin  to 80 mg daily RENAL A:   Acute on CKD chronic kidney disease), stage III-baseline creatinine 1.4; current creatinine 2.52 History of kidney transplant P:   Gentle IV hydration Monitor renal indicis Avoid nephrotoxic drugs Resume all home immunosuppressants  Gastroenterology GERD P Continue home dose of Protonix  Monitor for any acute GI bleed in light of TN K  ENDOCRINE A:   Type 2 diabetes with complications PVD associated with type 2 diabetes P:   Blood glucose monitoring with sliding  scale insulin coverage Resume all home diabetes medications Hemoglobin A1c with a.m. labs  NEUROLOGIC A:   Acute CVA with right-sided weakness, ataxia and expressive aphasia-symptoms resolved; NIH stroke score 0 P:   -Neurochecks per neurology - CT head as needed for any changes in neurological status - Statin therapy with atorvastatin  80 mg daily - Maintain blood pressure less than 180/105 - Neurology following; rest of treatment plan per neurology  Best Practice: Diet/type: Heart healthy diet VTE prophylaxis:  SCD's /SCDs but no pharmacologic VTE prophylaxis as patient just received TNK GI prophylaxis: PPI Lines: No central line Foley: No Foley Code Status: Full code Last date of multidisciplinary goals of care discussion: Patient and mother updated on current plan of care at bedside   Adamaris King S. [facility] ANP-BC Pulmonary and Critical Care Medicine [facility] Pager [phone] or [phone]  NB: This document was prepared using Dragon voice recognition software and may include unintentional dictation errors.    08/03/2023, 7:04 PM

## 2023-08-03 NOTE — Progress Notes (Signed)
   08/03/23 1330  Spiritual Encounters  Type of Visit Initial  Care provided to: Pt not available  Referral source Code page  Reason for visit Code  OnCall Visit No   Chaplain responded to a code stroke. Patient was attended to by the medical team.  No family present. If a chaplain is requested someone will respond.   Clarence Croak [facility] \ [phone]

## 2023-08-03 NOTE — Progress Notes (Signed)
ECHO tech to bedside.

## 2023-08-03 NOTE — ED Notes (Signed)
 Pt to the ICU

## 2023-08-04 ENCOUNTER — Inpatient Hospital Stay: Payer: Medicare (Managed Care)

## 2023-08-04 DIAGNOSIS — R791 Abnormal coagulation profile: Secondary | ICD-10-CM

## 2023-08-04 DIAGNOSIS — E109 Type 1 diabetes mellitus without complications: Secondary | ICD-10-CM | POA: Insufficient documentation

## 2023-08-04 DIAGNOSIS — I639 Cerebral infarction, unspecified: Secondary | ICD-10-CM | POA: Diagnosis not present

## 2023-08-04 DIAGNOSIS — I739 Peripheral vascular disease, unspecified: Secondary | ICD-10-CM | POA: Insufficient documentation

## 2023-08-04 DIAGNOSIS — Z9483 Pancreas transplant status: Secondary | ICD-10-CM

## 2023-08-04 DIAGNOSIS — G460 Middle cerebral artery syndrome: Secondary | ICD-10-CM | POA: Diagnosis not present

## 2023-08-04 LAB — URINE DRUG SCREEN, QUALITATIVE (ARMC ONLY)
Amphetamines, Ur Screen: NOT DETECTED
Barbiturates, Ur Screen: NOT DETECTED
Benzodiazepine, Ur Scrn: NOT DETECTED
Cannabinoid 50 Ng, Ur ~~LOC~~: NOT DETECTED
Cocaine Metabolite,Ur ~~LOC~~: NOT DETECTED
MDMA (Ecstasy)Ur Screen: NOT DETECTED
Methadone Scn, Ur: NOT DETECTED
Opiate, Ur Screen: NOT DETECTED
Phencyclidine (PCP) Ur S: NOT DETECTED
Tricyclic, Ur Screen: NOT DETECTED

## 2023-08-04 LAB — LIPID PANEL
Cholesterol: 133 mg/dL (ref 0–200)
HDL: 69 mg/dL (ref >40)
LDL Cholesterol: 58 mg/dL (ref 0–99)
Total CHOL/HDL Ratio: 1.9 ratio
Triglycerides: 31 mg/dL (ref <150)
VLDL: 6 mg/dL (ref 0–40)

## 2023-08-04 LAB — ECHOCARDIOGRAM COMPLETE
AR max vel: 1.83 cm2
AV Peak grad: 6.5 mmHg
Ao pk vel: 1.28 m/s
Area-P 1/2: 5.42 cm2
Height: 62 in
S' Lateral: 1.8 cm
Weight: 2546.75 [oz_av]

## 2023-08-04 LAB — BASIC METABOLIC PANEL WITH GFR
Anion gap: 5 (ref 5–15)
BUN: 36 mg/dL — ABNORMAL HIGH (ref 6–20)
CO2: 16 mmol/L — ABNORMAL LOW (ref 22–32)
Calcium: 8.4 mg/dL — ABNORMAL LOW (ref 8.9–10.3)
Chloride: 116 mmol/L — ABNORMAL HIGH (ref 98–111)
Creatinine, Ser: 2.08 mg/dL — ABNORMAL HIGH (ref 0.44–1.00)
GFR, Estimated: 28 mL/min — ABNORMAL LOW (ref >60)
Glucose, Bld: 106 mg/dL — ABNORMAL HIGH (ref 70–99)
Potassium: 4.5 mmol/L (ref 3.5–5.1)
Sodium: 137 mmol/L (ref 135–145)

## 2023-08-04 LAB — HIV ANTIBODY (ROUTINE TESTING W REFLEX): HIV Screen 4th Generation wRfx: NONREACTIVE

## 2023-08-04 LAB — APTT: aPTT: 24 s (ref 24–36)

## 2023-08-04 MED ORDER — CLOPIDOGREL BISULFATE 300 MG PO TABS
300.0000 mg | ORAL_TABLET | Freq: Once | ORAL | Status: AC
  Administered 2023-08-04: 300 mg via ORAL
  Filled 2023-08-04: qty 1

## 2023-08-04 MED ORDER — LORAZEPAM 2 MG/ML IJ SOLN
2.0000 mg | INTRAMUSCULAR | Status: DC | PRN
  Administered 2023-08-04: 2 mg via INTRAVENOUS
  Filled 2023-08-04: qty 1

## 2023-08-04 MED ORDER — DOXYCYCLINE HYCLATE 100 MG PO TABS
100.0000 mg | ORAL_TABLET | Freq: Two times a day (BID) | ORAL | Status: DC
  Administered 2023-08-04 – 2023-08-05 (×3): 100 mg via ORAL
  Filled 2023-08-04 (×3): qty 1

## 2023-08-04 MED ORDER — ENOXAPARIN SODIUM 30 MG/0.3ML IJ SOSY
30.0000 mg | PREFILLED_SYRINGE | INTRAMUSCULAR | Status: DC
  Administered 2023-08-04: 30 mg via SUBCUTANEOUS
  Filled 2023-08-04: qty 0.3

## 2023-08-04 MED ORDER — CLOPIDOGREL BISULFATE 75 MG PO TABS
75.0000 mg | ORAL_TABLET | Freq: Every day | ORAL | Status: DC
  Administered 2023-08-05: 75 mg via ORAL
  Filled 2023-08-04: qty 1

## 2023-08-04 MED ORDER — ASPIRIN 81 MG PO TBEC
81.0000 mg | DELAYED_RELEASE_TABLET | Freq: Every day | ORAL | Status: DC
  Administered 2023-08-04 – 2023-08-05 (×2): 81 mg via ORAL
  Filled 2023-08-04 (×2): qty 1

## 2023-08-04 NOTE — Plan of Care (Signed)

## 2023-08-04 NOTE — Progress Notes (Signed)
 SLP Cancellation Note  Patient Details Name: Kendra Schroeder MRN: [id number] DOB: 21-Mar-1970   Cancelled treatment:       Reason Eval/Treat Not Completed:  (chart reviewed)  Per chart review, pt received TNK 6/7 at 1401 on 6/7. ST order received for ST cognitive-linguistic evaluation. Will Hold on evaluation today until after f/u imaging is completed.     Darla Edward, MS, CCC-SLP Speech Language Pathologist Rehab Services; [facility] [phone] (ascom) Jeane Cashatt 08/04/2023, 9:00 AM

## 2023-08-04 NOTE — Evaluation (Signed)
 PT Cancellation Note  Patient Details Name: Kendra Schroeder MRN: [id number] DOB: [birth year]   Cancelled Treatment:    Reason Eval/Treat Not Completed: Patient not medically ready PT orders received, chart reviewed. Pt received TNK 6/7 at 1401. Per protocol, will hold PT evaluation until after f/u imaging today.   Emaline Handsome, PT, DPT 08/04/23, 8:14 AM   Venetta Gill 08/04/2023, 8:13 AM

## 2023-08-04 NOTE — Progress Notes (Signed)
 PROGRESS NOTE    Kendra Schroeder  ZOX:[id number] DOB: [birth year] DOA: 08/03/2023 PCP: Sims Duck, MD  Outpatient Specialists: nephrology, vascular surgery, neurology (all [facility])    Brief Narrative:   From admission h and p  This is a 53 year old with multiple stroke risk factors who presented with sudden onset right-sided flaccidity, ataxia and expressive aphasia.  Upon EMS arrival, patient was nonverbal and her right side was completely flaccid.  A code stroke was activated.  Upon arrival in the ED, patient remained flaccid on the right side with moderate expressive aphasia and persistent ataxia.  Her initial CT head was negative however based on her clinical presentation and multiple risk factors as well as her young age, TNK was administered.  Her symptoms have improved.  Her current NIH stroke score is 0.  She is being admitted to the ICU for further management.    Assessment & Plan:   Principal Problem:   Stroke determined by clinical assessment (HCC) Active Problems:   HTN (hypertension)   Kidney transplant recipient   Stage 3a chronic kidney disease (HCC)   T1DM (type 1 diabetes mellitus) (HCC)   PAD (peripheral artery disease) (HCC)   History of pancreas transplant (HCC)  # Acute CVA With aphasia and right sided weakness. Clinical diagnosis, CT head negative. Received TNK. TTE unremarkable. Ldl is 58 - maintain tele - MRI/MRA pending today - PT consult - cont home statin - anti-platelets per neurology - permissive htn, BPs under 180/105  # T1DM S/p pancreas transplant, not on   # HTN Permissive htn as above - hold home amlodipine , lisinopril, coreg  for now  # AKI on ckd 3b # Renal transplant status Recent baseline cr seems to be around 1.8, here 2.52 - nephrology consulted - continue home myfortic , prednisone , tacrolimus  for now - hold home lasix  - gentle hydration today  # Charcot foot # Chronic osteomyelitis - continue home doxy   DVT prophylaxis:  held pending today's MRI Code Status: full Family Communication: none at bedside  Level of care: ICU Status is: Inpatient Remains inpatient appropriate because: severity of illness    Consultants:  neurology  Procedures: none  Antimicrobials:  none    Subjective: Feels well  Objective: Vitals:   08/04/23 0715 08/04/23 0730 08/04/23 0800 08/04/23 0900  BP: (!) 160/83 (!) 153/64 (!) 155/68 (!) 142/52  Pulse: 87 84 87 88  Resp: 12 (!) 22 20 18   Temp:   98.6 F (37 C)   TempSrc:      SpO2: (!) 89% 97% 99% 97%  Weight:      Height:        Intake/Output Summary (Last 24 hours) at 08/04/2023 0922 Last data filed at 08/04/2023 0600 Gross per 24 hour  Intake 1018.61 ml  Output --  Net 1018.61 ml   Filed Weights   08/03/23 1356 08/03/23 1405 08/03/23 1452  Weight: 74 kg 74 kg 72.2 kg    Examination:  General exam: Appears calm and comfortable  Respiratory system: Clear to auscultation. Respiratory effort normal. Cardiovascular system: S1 & S2 heard, RRR.   Gastrointestinal system: Abdomen is nondistended, soft and nontender.   Central nervous system: Alert and oriented. No focal neurological deficits. 5/5 upper and lower strength, fluent speech Extremities: Symmetric 5 x 5 power. Right charcot foot Skin: No rashes, lesions or ulcers, skin changes plantar surface right foot Psychiatry: Judgement and insight appear normal. Mood & affect appropriate.     Data Reviewed: I have personally reviewed  following labs and imaging studies  CBC: Recent Labs  Lab 08/03/23 1352  WBC 8.1  NEUTROABS 4.4  HGB 9.9*  HCT 31.4*  MCV 92.6  PLT 155   Basic Metabolic Panel: Recent Labs  Lab 08/03/23 1352  NA 138  K 3.7  CL 115*  CO2 16*  GLUCOSE 100*  BUN 40*  CREATININE 2.52*  CALCIUM  8.5*   GFR: Estimated Creatinine Clearance: 24 mL/min (A) (by C-G formula based on SCr of 2.52 mg/dL (H)). Liver Function Tests: Recent Labs  Lab 08/03/23 1352  AST 17  ALT 24   ALKPHOS 52  BILITOT 1.0  PROT 5.8*  ALBUMIN  3.1*   No results for input(s): "LIPASE", "AMYLASE" in the last 168 hours. No results for input(s): "AMMONIA" in the last 168 hours. Coagulation Profile: Recent Labs  Lab 08/03/23 1352  INR 1.1   Cardiac Enzymes: Recent Labs  Lab 08/03/23 1950  CKTOTAL 38   BNP (last 3 results) No results for input(s): "PROBNP" in the last 8760 hours. HbA1C: No results for input(s): "HGBA1C" in the last 72 hours. CBG: Recent Labs  Lab 08/03/23 1334 08/03/23 1458 08/03/23 2333  GLUCAP 90 86 122*   Lipid Profile: Recent Labs    08/04/23 0728  CHOL 133  HDL 69  LDLCALC 58  TRIG 31  CHOLHDL 1.9   Thyroid Function Tests: No results for input(s): "TSH", "T4TOTAL", "FREET4", "T3FREE", "THYROIDAB" in the last 72 hours. Anemia Panel: No results for input(s): "VITAMINB12", "FOLATE", "FERRITIN", "TIBC", "IRON", "RETICCTPCT" in the last 72 hours. Urine analysis:    Component Value Date/Time   COLORURINE AMBER (A) 08/25/2016 1132   APPEARANCEUR TURBID (A) 08/25/2016 1132   LABSPEC 1.024 08/25/2016 1132   PHURINE 5.0 08/25/2016 1132   GLUCOSEU NEGATIVE 08/25/2016 1132   HGBUR NEGATIVE 08/25/2016 1132   BILIRUBINUR NEGATIVE 08/25/2016 1132   KETONESUR 5 (A) 08/25/2016 1132   PROTEINUR 100 (A) 08/25/2016 1132   NITRITE NEGATIVE 08/25/2016 1132   LEUKOCYTESUR MODERATE (A) 08/25/2016 1132   Sepsis Labs: @LABRCNTIP (procalcitonin:4,lacticidven:4)  ) Recent Results (from the past 240 hours)  MRSA Next Gen by PCR, Nasal     Status: None   Collection Time: 08/03/23  2:55 PM   Specimen: Nasal Mucosa; Nasal Swab  Result Value Ref Range Status   MRSA by PCR Next Gen NOT DETECTED NOT DETECTED Final    Comment: (NOTE) The GeneXpert MRSA Assay (FDA approved for NASAL specimens only), is one component of a comprehensive MRSA colonization surveillance program. It is not intended to diagnose MRSA infection nor to guide or monitor treatment for  MRSA infections. Test performance is not FDA approved in patients less than 40 years old. Performed at [facility], [address] [place], [place] [address]          Radiology Studies: ECHOCARDIOGRAM COMPLETE Result Date: 08/04/2023    ECHOCARDIOGRAM REPORT   Patient Name:   BRITHANY WHITWORTH Hoopingarner Date of Exam: 08/03/2023 Medical Rec #:  [id number]        Height:       62.0 in Accession #:    [id number]       Weight:       159.2 lb Date of Birth:  September 18, 1970         BSA:          1.735 m Patient Age:    52 years         BP:           165/84  mmHg Patient Gender: F                HR:           90 bpm. Exam Location:  [facility] Procedure: 2D Echo, Cardiac Doppler, Color Doppler and Saline Contrast Bubble            Study (Both Spectral and Color Flow Doppler were utilized during            procedure). Indications:     Stroke I63.9  History:         Patient has prior history of Echocardiogram examinations, most                  recent 12/02/2017.  Sonographer:     Clenton Czech RDCS, FASE Referring Phys:  [id number] Ygnacio Hemming BHAGAT Diagnosing Phys: Lida Reeks Alluri IMPRESSIONS  1. Left ventricular ejection fraction, by estimation, is 65 to 70%. The left ventricle has normal function. The left ventricle has no regional wall motion abnormalities. There is moderate concentric left ventricular hypertrophy. Left ventricular diastolic parameters are consistent with Grade I diastolic dysfunction (impaired relaxation).  2. Right ventricular systolic function is normal. The right ventricular size is normal.  3. The mitral valve is normal in structure. No evidence of mitral valve regurgitation.  4. The aortic valve is tricuspid. There is mild thickening of the aortic valve. Aortic valve regurgitation is not visualized.  5. The inferior vena cava is normal in size with greater than 50% respiratory variability, suggesting right atrial pressure of 3 mmHg.  6. Agitated saline contrast bubble study was negative, with no  evidence of any interatrial shunt. FINDINGS  Left Ventricle: Left ventricular ejection fraction, by estimation, is 65 to 70%. The left ventricle has normal function. The left ventricle has no regional wall motion abnormalities. The left ventricular internal cavity size was normal in size. There is  moderate concentric left ventricular hypertrophy. Left ventricular diastolic parameters are consistent with Grade I diastolic dysfunction (impaired relaxation). Right Ventricle: The right ventricular size is normal. No increase in right ventricular wall thickness. Right ventricular systolic function is normal. Left Atrium: Left atrial size was normal in size. Right Atrium: Right atrial size was normal in size. Pericardium: There is no evidence of pericardial effusion. Mitral Valve: The mitral valve is normal in structure. No evidence of mitral valve regurgitation. Tricuspid Valve: The tricuspid valve is normal in structure. Tricuspid valve regurgitation is trivial. Aortic Valve: The aortic valve is tricuspid. There is mild thickening of the aortic valve. Aortic valve regurgitation is not visualized. Aortic valve peak gradient measures 6.5 mmHg. Pulmonic Valve: The pulmonic valve was not well visualized. Pulmonic valve regurgitation is not visualized. Aorta: The aortic root and ascending aorta are structurally normal, with no evidence of dilitation. Venous: The inferior vena cava is normal in size with greater than 50% respiratory variability, suggesting right atrial pressure of 3 mmHg. IAS/Shunts: The atrial septum is grossly normal. Agitated saline contrast was given intravenously to evaluate for intracardiac shunting. Agitated saline contrast bubble study was negative, with no evidence of any interatrial shunt.  LEFT VENTRICLE PLAX 2D LVIDd:         2.60 cm   Diastology LVIDs:         1.80 cm   LV e' medial:    5.66 cm/s LV PW:         1.40 cm   LV E/e' medial:  14.5 LV IVS:        1.40  cm   LV e' lateral:   6.42 cm/s  LVOT diam:     1.90 cm   LV E/e' lateral: 12.8 LV SV:         41 LV SV Index:   24 LVOT Area:     2.84 cm  RIGHT VENTRICLE RV Basal diam:  2.30 cm RV S prime:     11.30 cm/s TAPSE (M-mode): 1.9 cm LEFT ATRIUM           Index        RIGHT ATRIUM           Index LA diam:      2.80 cm 1.61 cm/m   RA Area:     12.30 cm LA Vol (A2C): 19.5 ml 11.24 ml/m  RA Volume:   28.60 ml  16.49 ml/m LA Vol (A4C): 20.0 ml 11.53 ml/m  AORTIC VALVE                 PULMONIC VALVE AV Area (Vmax): 1.83 cm     PV Vmax:        0.88 m/s AV Vmax:        127.50 cm/s  PV Peak grad:   3.1 mmHg AV Peak Grad:   6.5 mmHg     RVOT Peak grad: 2 mmHg LVOT Vmax:      82.20 cm/s LVOT Vmean:     52.300 cm/s LVOT VTI:       0.146 m  AORTA Ao Root diam: 2.90 cm Ao Asc diam:  2.70 cm MITRAL VALVE MV Area (PHT): 5.42 cm    SHUNTS MV Decel Time: 140 msec    Systemic VTI:  0.15 m MV E velocity: 82.30 cm/s  Systemic Diam: 1.90 cm MV A velocity: 70.70 cm/s MV E/A ratio:  1.16 Joetta Mustache Electronically signed by Joetta Mustache Signature Date/Time: 08/04/2023/7:55:35 AM    Final    CT HEAD CODE STROKE WO CONTRAST Result Date: 08/03/2023 CLINICAL DATA:  Code stroke.  Neuro deficit, concern for stroke. EXAM: CT HEAD WITHOUT CONTRAST TECHNIQUE: Contiguous axial images were obtained from the base of the skull through the vertex without intravenous contrast. RADIATION DOSE REDUCTION: This exam was performed according to the departmental dose-optimization program which includes automated exposure control, adjustment of the mA and/or kV according to patient size and/or use of iterative reconstruction technique. COMPARISON:  CT head 09/23/2018. FINDINGS: Brain: No acute intracranial hemorrhage. No CT evidence of acute infarct. Nonspecific hypoattenuation in the periventricular and subcortical white matter favored to reflect chronic microvascular ischemic changes. No edema, mass effect, or midline shift. The basilar cisterns are patent. Ventricles: The  ventricles are normal. Vascular: Atherosclerotic calcifications of the carotid siphons and intracranial vertebral arteries. No hyperdense vessel. Skull: No acute or aggressive finding. Orbits: Orbits are symmetric. Sinuses: The visualized paranasal sinuses are clear. Other: Mastoid air cells are clear. ASPECTS [facility] Stroke Program Early CT Score) - Ganglionic level infarction (caudate, lentiform nuclei, internal capsule, insula, M1-M3 cortex): 7 - Supraganglionic infarction (M4-M6 cortex): 3 Total score (0-10 with 10 being normal): 10 IMPRESSION: 1. No CT evidence of acute intracranial abnormality. 2. Mild chronic microvascular ischemic changes. 3. ASPECTS is 10 These results were communicated to Dr. Cleone Dad at 1:51 pm on 08/03/2023 by text page via the [facility] messaging system. Electronically Signed   By: Denny Flack M.D.   On: 08/03/2023 13:51        Scheduled Meds:   stroke: early stages of recovery book   Does  not apply Once   atorvastatin   40 mg Oral QHS   carvedilol   12.5 mg Oral BID WC   Chlorhexidine Gluconate Cloth  6 each Topical Q0600   insulin aspart  0-15 Units Subcutaneous Q4H   mycophenolate   360 mg Oral BID   pantoprazole  (PROTONIX ) IV  40 mg Intravenous QHS   predniSONE   5 mg Oral Daily   tacrolimus   3 mg Oral BID   Continuous Infusions:  sodium chloride  75 mL/hr at 08/04/23 0600     LOS: 1 day     Raymonde Calico, MD [facility]   If 7PM-7AM, please contact night-coverage [email or web address] Password [email or web address] 08/04/2023, 9:22 AM

## 2023-08-04 NOTE — Progress Notes (Signed)
 [facility] CONSULT NOTE    Date: 08/04/2023                  Patient Name:  Kendra Schroeder  MRN: [id number]  DOB: December 31, 1970  Age / Sex: 53 y.o., female         PCP: True, Daphene Dys, MD                 Service Requesting Consult:                  Reason for Consult: Acute kidney injury on a history of  renal transplant.            History of Present Illness: Patient is a 53 y.o. female with a PMHx of hypertension, diabetes, peripheral arterial disease, history of simultaneous kidney pancreas transplant.  She is now admitted with history of sudden onset of right-sided weakness and expressive aphasia.  She was given TNK on arrival in the emergency room.  She made significant improvement in her speech and also weakness.  She has chronic kidney disease with a baseline creatinine of about 2.  She had renal transplant in 2010.  She has been on tacrolimus , Myfortic  and prednisone .  Medications: Outpatient medications: Medications Prior to Admission  Medication Sig Dispense Refill Last Dose/Taking   albuterol  (PROVENTIL  HFA;VENTOLIN  HFA) 108 (90 BASE) MCG/ACT inhaler Inhale 2 puffs into the lungs every 6 (six) hours as needed for wheezing or shortness of breath. 1 Inhaler 2 Past Week   aspirin  81 MG EC tablet Take 81 mg by mouth daily.    Past Week   atorvastatin  (LIPITOR) 10 MG tablet Take 40 mg by mouth daily.   08/02/2023   calcium -vitamin D  (OSCAL WITH D) 500-200 MG-UNIT tablet Take 1 tablet by mouth daily.   Past Week   carvedilol  (COREG ) 25 MG tablet Take 12.5 mg by mouth 2 (two) times daily with a meal.   08/02/2023   Difluprednate (DUREZOL) 0.05 % EMUL Apply 1 drop to eye daily as needed.   Past Week   doxycycline  (VIBRA -TABS) 100 MG tablet Take 100 mg by mouth 2 (two) times daily.   08/02/2023   furosemide  (LASIX ) 20 MG tablet Take 20 mg by mouth daily.   08/02/2023   gabapentin (NEURONTIN) 100 MG capsule Take 100 mg by mouth 2 (two) times daily.   08/02/2023    lisinopril (ZESTRIL) 10 MG tablet Take 10 mg by mouth daily.   08/02/2023   moxifloxacin (VIGAMOX) 0.5 % ophthalmic solution Place 1 drop into both eyes daily as needed.   Past Week   Multiple Vitamin (MULTIVITAMIN) tablet Take 1 tablet by mouth daily.   Past Week   MYFORTIC  180 MG EC tablet Take 360 mg by mouth 2 (two) times daily. (brand name only)   08/02/2023   omeprazole (PRILOSEC) 40 MG capsule Take 40 mg by mouth 2 (two) times daily.   08/02/2023   polyethylene glycol (MIRALAX / GLYCOLAX) packet Take 17 g by mouth daily as needed for mild constipation.   Past Week   predniSONE  (DELTASONE ) 2.5 MG tablet Take 5 mg by mouth daily.   08/02/2023   PROGRAF  1 MG capsule Take 3 mg by mouth 2 (two) times daily.   08/02/2023   amitriptyline  (ELAVIL ) 25 MG tablet Take 25 mg by mouth 2 (two) times daily. (Patient not taking: Reported on 02/05/2022)   Not Taking   amLODipine  (NORVASC ) 5 MG tablet Take 5 mg by mouth  daily. (Patient not taking: Reported on 02/05/2022)   Not Taking   cefTAZidime  2 g in sodium chloride  0.9 % 100 mL Inject 2 g into the vein every 12 (twelve) hours. (Patient not taking: Reported on 02/05/2022)   Not Taking   HEPARIN  LOCK FLUSH IV Inject 3 mLs into the vein daily. (Patient not taking: Reported on 02/05/2022)   Not Taking   silver sulfADIAZINE (SILVADENE) 1 % cream Apply 1 application topically daily. (Patient not taking: Reported on 08/03/2023)   Not Taking    Discontinued Meds:   Medications Discontinued During This Encounter  Medication Reason   pantoprazole  (PROTONIX ) EC tablet 40 mg    amLODipine  (NORVASC ) tablet 5 mg Patient Preference   amitriptyline  (ELAVIL ) tablet 25 mg Patient Preference   lisinopril (ZESTRIL) tablet 10 mg    LORazepam (ATIVAN) injection 2 mg    carvedilol  (COREG ) tablet 12.5 mg     Current medications: Current Facility-Administered Medications  Medication Dose Route Frequency Provider Last Rate Last Admin   0.9 %  sodium chloride  infusion   Intravenous  Continuous Tukov-Yual, Magdalene S, NP 75 mL/hr at 08/04/23 0600 Infusion Verify at 08/04/23 0600   acetaminophen  (TYLENOL ) tablet 650 mg  650 mg Oral Q4H PRN Bhagat, Srishti L, MD   650 mg at 08/04/23 0429   Or   acetaminophen  (TYLENOL ) 160 MG/5ML solution 650 mg  650 mg Per Tube Q4H PRN Bhagat, Srishti L, MD       Or   acetaminophen  (TYLENOL ) suppository 650 mg  650 mg Rectal Q4H PRN Bhagat, Srishti L, MD       albuterol  (PROVENTIL ) (2.5 MG/3ML) 0.083% nebulizer solution 2.5 mg  2.5 mg Inhalation Q6H PRN Tukov-Yual, Magdalene S, NP       atorvastatin  (LIPITOR) tablet 40 mg  40 mg Oral QHS Tukov-Yual, Magdalene S, NP   40 mg at 08/03/23 2102   Chlorhexidine Gluconate Cloth 2 % PADS 6 each  6 each Topical Q0600 Kasa, Kurian, MD   6 each at 08/04/23 0831   doxycycline  (VIBRA -TABS) tablet 100 mg  100 mg Oral BID Janeane Mealy, MD   100 mg at 08/04/23 1026   hydrALAZINE (APRESOLINE) injection 10 mg  10 mg Intravenous Q4H PRN Bhagat, Srishti L, MD       insulin aspart (novoLOG) injection 0-15 Units  0-15 Units Subcutaneous Q4H Tukov-Yual, Magdalene S, NP       labetalol (NORMODYNE) injection 10 mg  10 mg Intravenous Q2H PRN Bhagat, Srishti L, MD       LORazepam (ATIVAN) injection 2 mg  2 mg Intravenous Q30 min PRN Bhagat, Srishti L, MD       mycophenolate  (MYFORTIC ) EC tablet 360 mg  360 mg Oral BID Tukov-Yual, Magdalene S, NP   360 mg at 08/04/23 1027   pantoprazole  (PROTONIX ) injection 40 mg  40 mg Intravenous QHS Bhagat, Srishti L, MD   40 mg at 08/03/23 2102   polyethylene glycol (MIRALAX / GLYCOLAX) packet 17 g  17 g Oral Daily PRN Tukov-Yual, Magdalene S, NP       predniSONE  (DELTASONE ) tablet 5 mg  5 mg Oral Daily Tukov-Yual, Magdalene S, NP   5 mg at 08/04/23 1026   senna-docusate (Senokot-S) tablet 1 tablet  1 tablet Oral QHS PRN Bhagat, Srishti L, MD       tacrolimus  (PROGRAF ) capsule 3 mg  3 mg Oral BID Tukov-Yual, Magdalene S, NP   3 mg at 08/04/23 1026      Allergies:  Allergies   Allergen Reactions   Cefepime Other (See Comments)    Altered mental status, aphasia   Percocet [Oxycodone -Acetaminophen ] Other (See Comments)    Confusion, dizziness      Past Medical History: Past Medical History:  Diagnosis Date   CKD (chronic kidney disease), stage III (HCC)    Diabetes mellitus without complication (HCC)    GERD (gastroesophageal reflux disease)    Hypertension    Kidney transplant recipient    Myocardial infarct [facility])      Past Surgical History: Past Surgical History:  Procedure Laterality Date   CARPAL TUNNEL RELEASE Left    COMBINED KIDNEY-PANCREAS TRANSPLANT     EYE SURGERY     NEPHRECTOMY TRANSPLANTED ORGAN     TOE AMPUTATION       Family History: Family History  Problem Relation Age of Onset   Diabetes Unknown    Cancer Unknown      Social History: Social History   Socioeconomic History   Marital status: Single    Spouse name: Not on file   Number of children: Not on file   Years of education: Not on file   Highest education level: Not on file  Occupational History   Not on file  Tobacco Use   Smoking status: Never   Smokeless tobacco: Never  Substance and Sexual Activity   Alcohol use: No   Drug use: No   Sexual activity: Not on file  Other Topics Concern   Not on file  Social History Narrative   Not on file   Social Drivers of Health   Financial Resource Strain: Low Risk  (03/23/2023)   Received from [facility]   Overall Financial Resource Strain (CARDIA)    Difficulty of Paying Living Expenses: Not hard at all  Food Insecurity: Patient Declined (08/03/2023)   Hunger Vital Sign    Worried About Running Out of Food in the Last Year: Patient declined    Ran Out of Food in the Last Year: Patient declined  Transportation Needs: Patient Declined (08/03/2023)   PRAPARE - Administrator, Civil Service (Medical): Patient declined    Lack of Transportation (Non-Medical): Patient declined  Physical Activity:  Insufficiently Active (10/14/2020)   Received from [facility]   Exercise Vital Sign    Days of Exercise per Week: 2 days    Minutes of Exercise per Session: 60 min  Stress: No Stress Concern Present (10/14/2020)   Received from [facility], [facility] - Occupational Stress Questionnaire    Feeling of Stress : Not at all  Social Connections: Moderately Integrated (10/14/2020)   Received from [facility], [facility]   Social Connection and Isolation Panel [NHANES]    Frequency of Communication with Friends and Family: More than three times a week    Frequency of Social Gatherings with Friends and Family: Twice a week    Attends Religious Services: More than 4 times per year    Active Member of [employer] or Organizations: Yes    Attends Engineer, structural: More than 4 times per year    Marital Status: Never married  Intimate Partner Violence: Patient Declined (08/03/2023)   Humiliation, Afraid, Rape, and Kick questionnaire    Fear of Current or Ex-Partner: Patient declined    Emotionally Abused: Patient declined    Physically Abused: Patient declined    Sexually Abused: Patient declined  Review of Systems: As per HPI  Vital Signs: Blood pressure (!) 142/52, pulse 88, temperature 98.6 F (37 C), resp. rate 18, height 5\' 2"  (1.575 m), weight 72.2 kg, SpO2 97%.  Weight trends: Filed Weights   08/03/23 1356 08/03/23 1405 08/03/23 1452  Weight: 74 kg 74 kg 72.2 kg    Physical Exam: Physical Exam: General:  No acute distress  Head:  Normocephalic, atraumatic. Moist oral mucosal membranes  Eyes:  Anicteric  Neck:  Supple  Lungs:   Clear to auscultation, normal effort  Heart:  S1S2 no rubs  Abdomen:   Soft, nontender, bowel sounds present  Extremities:  peripheral edema.  Neurologic:  Awake, alert, following commands  Skin:  No lesions  Access:     Lab results:  Basic Metabolic  Panel: Recent Labs  Lab 08/03/23 1352 08/04/23 0728  NA 138 137  K 3.7 4.5  CL 115* 116*  CO2 16* 16*  GLUCOSE 100* 106*  BUN 40* 36*  CREATININE 2.52* 2.08*  CALCIUM  8.5* 8.4*    Creatinine  Date/Time Value Ref Range Status  02/15/2014 09:28 AM 1.38 (H) 0.60 - 1.30 mg/dL Final  16/11/9602 54:09 PM 1.12 0.60 - 1.30 mg/dL Final  81/19/1478 29:56 AM 2.79 (H) 0.60 - 1.30 mg/dL Final  21/30/8657 84:69 PM 1.31 (H) 0.60 - 1.30 mg/dL Final  62/95/2841 32:44 AM 1.10 0.60 - 1.30 mg/dL Final  02/28/7251 66:44 AM 1.24 0.60 - 1.30 mg/dL Final  03/47/4259 56:38 AM 1.51 (H) 0.60 - 1.30 mg/dL Final  75/64/3329 51:88 AM 1.13 0.60 - 1.30 mg/dL Final  41/66/0630 16:01 AM 1.19 0.60 - 1.30 mg/dL Final  09/32/3557 32:20 AM 1.11 0.60 - 1.30 mg/dL Final  25/42/7062 37:62 AM 1.08 0.60 - 1.30 mg/dL Final  83/15/1761 60:73 AM 1.28 0.60 - 1.30 mg/dL Final  71/07/2692 85:46 AM 0.89 0.60 - 1.30 mg/dL Final  27/04/5007 38:18 AM 1.11 0.60 - 1.30 mg/dL Final   Creatinine, Ser  Date/Time Value Ref Range Status  08/04/2023 07:28 AM 2.08 (H) 0.44 - 1.00 mg/dL Final  29/93/7169 67:89 PM 2.52 (H) 0.44 - 1.00 mg/dL Final  38/11/1749 02:58 AM 1.41 (H) 0.44 - 1.00 mg/dL Final  52/77/8242 35:36 PM 1.56 (H) 0.44 - 1.00 mg/dL Final  14/43/1540 08:67 AM 1.56 (H) 0.44 - 1.00 mg/dL Final  61/95/0932 67:12 AM 1.33 (H) 0.44 - 1.00 mg/dL Final  45/80/9983 38:25 AM 1.72 (H) 0.44 - 1.00 mg/dL Final  05/39/7673 41:93 AM 1.87 (H) 0.44 - 1.00 mg/dL Final  79/03/4095 35:32 AM 1.52 (H) 0.44 - 1.00 mg/dL Final  99/24/2683 41:96 AM 1.47 (H) 0.44 - 1.00 mg/dL Final  22/29/7989 21:19 AM 1.64 (H) 0.44 - 1.00 mg/dL Final  41/74/0814 48:18 AM 1.70 (H) 0.44 - 1.00 mg/dL Final  56/31/4970 26:37 AM 1.64 (H) 0.44 - 1.00 mg/dL Final  85/88/5027 74:12 AM 1.50 (H) 0.44 - 1.00 mg/dL Final  87/86/7672 09:47 AM 1.82 (H) 0.44 - 1.00 mg/dL Final  09/62/8366 29:47 AM 1.80 (H) 0.44 - 1.00 mg/dL Final  65/46/5035 46:56 PM 1.68 (H) 0.44 - 1.00  mg/dL Final  81/27/5170 01:74 AM 1.49 (H) 0.44 - 1.00 mg/dL Final  94/49/6759 16:38 PM 1.35 (H) 0.44 - 1.00 mg/dL Final  46/65/9935 70:17 AM 2.73 (H) 0.44 - 1.00 mg/dL Final  79/39/0300 92:33 AM 1.64 (H) 0.44 - 1.00 mg/dL Final  00/76/2263 33:54 AM 1.60 (H) 0.44 - 1.00 mg/dL Final  56/25/6389 37:34 AM 1.22 (H) 0.44 - 1.00 mg/dL Final  28/76/8115 72:62 PM 1.39 (  H) 0.44 - 1.00 mg/dL Final  04/54/0981 19:14 AM 1.68 (H) 0.44 - 1.00 mg/dL Final  78/29/5621 30:86 AM 1.21 (H) 0.44 - 1.00 mg/dL Final    CBC: Recent Labs  Lab 08/03/23 1352  WBC 8.1  NEUTROABS 4.4  HGB 9.9*  HCT 31.4*  MCV 92.6  PLT 155    Microbiology: Results for orders placed or performed during the hospital encounter of 08/03/23  MRSA Next Gen by PCR, Nasal     Status: None   Collection Time: 08/03/23  2:55 PM   Specimen: Nasal Mucosa; Nasal Swab  Result Value Ref Range Status   MRSA by PCR Next Gen NOT DETECTED NOT DETECTED Final    Comment: (NOTE) The GeneXpert MRSA Assay (FDA approved for NASAL specimens only), is one component of a comprehensive MRSA colonization surveillance program. It is not intended to diagnose MRSA infection nor to guide or monitor treatment for MRSA infections. Test performance is not FDA approved in patients less than 61 years old. Performed at [facility], [address] [place], [place] [address]     Urinalysis: No results for input(s): "COLORURINE", "LABSPEC", "PHURINE", "GLUCOSEU", "HGBUR", "BILIRUBINUR", "KETONESUR", "PROTEINUR", "UROBILINOGEN", "NITRITE", "LEUKOCYTESUR" in the last 72 hours.  Invalid input(s): "APPERANCEUR"   Imaging:  ECHOCARDIOGRAM COMPLETE Result Date: 08/04/2023    ECHOCARDIOGRAM REPORT   Patient Name:   SHARESE MANRIQUE Nand Date of Exam: 08/03/2023 Medical Rec #:  [id number]        Height:       62.0 in Accession #:    [id number]       Weight:       159.2 lb Date of Birth:  02/25/1971         BSA:          1.735 m Patient Age:    52 years          BP:           165/84 mmHg Patient Gender: F                HR:           90 bpm. Exam Location:  [facility] Procedure: 2D Echo, Cardiac Doppler, Color Doppler and Saline Contrast Bubble            Study (Both Spectral and Color Flow Doppler were utilized during            procedure). Indications:     Stroke I63.9  History:         Patient has prior history of Echocardiogram examinations, most                  recent 12/02/2017.  Sonographer:     Clenton Czech RDCS, FASE Referring Phys:  [id number] Ygnacio Hemming BHAGAT Diagnosing Phys: Lida Reeks Alluri IMPRESSIONS  1. Left ventricular ejection fraction, by estimation, is 65 to 70%. The left ventricle has normal function. The left ventricle has no regional wall motion abnormalities. There is moderate concentric left ventricular hypertrophy. Left ventricular diastolic parameters are consistent with Grade I diastolic dysfunction (impaired relaxation).  2. Right ventricular systolic function is normal. The right ventricular size is normal.  3. The mitral valve is normal in structure. No evidence of mitral valve regurgitation.  4. The aortic valve is tricuspid. There is mild thickening of the aortic valve. Aortic valve regurgitation is not visualized.  5. The inferior vena cava is normal in size with greater than 50% respiratory variability, suggesting right atrial pressure of  3 mmHg.  6. Agitated saline contrast bubble study was negative, with no evidence of any interatrial shunt. FINDINGS  Left Ventricle: Left ventricular ejection fraction, by estimation, is 65 to 70%. The left ventricle has normal function. The left ventricle has no regional wall motion abnormalities. The left ventricular internal cavity size was normal in size. There is  moderate concentric left ventricular hypertrophy. Left ventricular diastolic parameters are consistent with Grade I diastolic dysfunction (impaired relaxation). Right Ventricle: The right ventricular size is normal. No increase in right  ventricular wall thickness. Right ventricular systolic function is normal. Left Atrium: Left atrial size was normal in size. Right Atrium: Right atrial size was normal in size. Pericardium: There is no evidence of pericardial effusion. Mitral Valve: The mitral valve is normal in structure. No evidence of mitral valve regurgitation. Tricuspid Valve: The tricuspid valve is normal in structure. Tricuspid valve regurgitation is trivial. Aortic Valve: The aortic valve is tricuspid. There is mild thickening of the aortic valve. Aortic valve regurgitation is not visualized. Aortic valve peak gradient measures 6.5 mmHg. Pulmonic Valve: The pulmonic valve was not well visualized. Pulmonic valve regurgitation is not visualized. Aorta: The aortic root and ascending aorta are structurally normal, with no evidence of dilitation. Venous: The inferior vena cava is normal in size with greater than 50% respiratory variability, suggesting right atrial pressure of 3 mmHg. IAS/Shunts: The atrial septum is grossly normal. Agitated saline contrast was given intravenously to evaluate for intracardiac shunting. Agitated saline contrast bubble study was negative, with no evidence of any interatrial shunt.  LEFT VENTRICLE PLAX 2D LVIDd:         2.60 cm   Diastology LVIDs:         1.80 cm   LV e' medial:    5.66 cm/s LV PW:         1.40 cm   LV E/e' medial:  14.5 LV IVS:        1.40 cm   LV e' lateral:   6.42 cm/s LVOT diam:     1.90 cm   LV E/e' lateral: 12.8 LV SV:         41 LV SV Index:   24 LVOT Area:     2.84 cm  RIGHT VENTRICLE RV Basal diam:  2.30 cm RV S prime:     11.30 cm/s TAPSE (M-mode): 1.9 cm LEFT ATRIUM           Index        RIGHT ATRIUM           Index LA diam:      2.80 cm 1.61 cm/m   RA Area:     12.30 cm LA Vol (A2C): 19.5 ml 11.24 ml/m  RA Volume:   28.60 ml  16.49 ml/m LA Vol (A4C): 20.0 ml 11.53 ml/m  AORTIC VALVE                 PULMONIC VALVE AV Area (Vmax): 1.83 cm     PV Vmax:        0.88 m/s AV Vmax:         127.50 cm/s  PV Peak grad:   3.1 mmHg AV Peak Grad:   6.5 mmHg     RVOT Peak grad: 2 mmHg LVOT Vmax:      82.20 cm/s LVOT Vmean:     52.300 cm/s LVOT VTI:       0.146 m  AORTA Ao Root diam: 2.90 cm Ao Asc diam:  2.70 cm MITRAL VALVE MV Area (PHT): 5.42 cm    SHUNTS MV Decel Time: 140 msec    Systemic VTI:  0.15 m MV E velocity: 82.30 cm/s  Systemic Diam: 1.90 cm MV A velocity: 70.70 cm/s MV E/A ratio:  1.16 Joetta Mustache Electronically signed by Joetta Mustache Signature Date/Time: 08/04/2023/7:55:35 AM    Final    CT HEAD CODE STROKE WO CONTRAST Result Date: 08/03/2023 CLINICAL DATA:  Code stroke.  Neuro deficit, concern for stroke. EXAM: CT HEAD WITHOUT CONTRAST TECHNIQUE: Contiguous axial images were obtained from the base of the skull through the vertex without intravenous contrast. RADIATION DOSE REDUCTION: This exam was performed according to the departmental dose-optimization program which includes automated exposure control, adjustment of the mA and/or kV according to patient size and/or use of iterative reconstruction technique. COMPARISON:  CT head 09/23/2018. FINDINGS: Brain: No acute intracranial hemorrhage. No CT evidence of acute infarct. Nonspecific hypoattenuation in the periventricular and subcortical white matter favored to reflect chronic microvascular ischemic changes. No edema, mass effect, or midline shift. The basilar cisterns are patent. Ventricles: The ventricles are normal. Vascular: Atherosclerotic calcifications of the carotid siphons and intracranial vertebral arteries. No hyperdense vessel. Skull: No acute or aggressive finding. Orbits: Orbits are symmetric. Sinuses: The visualized paranasal sinuses are clear. Other: Mastoid air cells are clear. ASPECTS [facility] Stroke Program Early CT Score) - Ganglionic level infarction (caudate, lentiform nuclei, internal capsule, insula, M1-M3 cortex): 7 - Supraganglionic infarction (M4-M6 cortex): 3 Total score (0-10 with 10 being normal): 10  IMPRESSION: 1. No CT evidence of acute intracranial abnormality. 2. Mild chronic microvascular ischemic changes. 3. ASPECTS is 10 These results were communicated to Dr. Cleone Dad at 1:51 pm on 08/03/2023 by text page via the [facility] messaging system. Electronically Signed   By: Denny Flack M.D.   On: 08/03/2023 13:51     Assessment & Plan:  53 y.o. female with a PMHx of hypertension, diabetes, peripheral arterial disease, history of simultaneous kidney pancreas transplant.  She is now admitted with history of sudden onset of right-sided weakness and expressive aphasia.  She was given TNK on arrival in the emergency room.  She made significant improvement in her speech and also weakness. She has chronic kidney disease with a baseline creatinine of about 2.  She had renal transplant in 2010.  She has been on tacrolimus , Myfortic  and prednisone .   Principal Problem:   Stroke determined by clinical assessment (HCC) Active Problems:   HTN (hypertension)   Kidney transplant recipient   Stage 3a chronic kidney disease (HCC)   T1DM (type 1 diabetes mellitus) (HCC)   PAD (peripheral artery disease) (HCC)   History of pancreas transplant (HCC)  #1: Acute kidney injury on chronic kidney disease: Acute kidney injury most likely secondary to prerenal azotemia.  Patient has chronic kidney disease with a baseline creatinine of about 2.  Will continue to monitor closely.  Will also obtain a renal sonogram.  #2: Anemia: Anemia is most likely secondary to chronic kidney disease.  #3: Status post renal transplant: We will continue the tacrolimus , Myfortic  and prednisone  at the present doses.  Will need to check tacrolimus  levels.  #4: Hypertension: As outpatient she has been on amlodipine  and lisinopril with furosemide .  Will continue the labetalol and hydralazine on as-needed basis.  We can resume the amlodipine  and lisinopril at this time.  #5: Cerebrovascular accident: Acute CVA status post tenecteplase  infusion with immediate recovery.  Spoke to the patient at bedside in detail  and answered all her questions to her satisfaction. Labs and medications reviewed. Will follow closely along with you.  LOS: 1 Worthy Heads, MD [facility]. 6/8/202510:53 AM

## 2023-08-04 NOTE — Progress Notes (Addendum)
 NEUROLOGY CONSULT FOLLOW UP NOTE   Date of service: August 04, 2023 Patient Name: Kendra Schroeder MRN:  [id number] DOB:  Sep 09, 1970  Interval Hx/subjective   - Much improved speech, back to baseline - no acute concerns  Vitals   Vitals:   08/04/23 0645 08/04/23 0715 08/04/23 0730 08/04/23 0800  BP: (!) 169/86 (!) 160/83 (!) 153/64 (!) 155/68  Pulse: 86 87 84 87  Resp: 18 12 (!) 22 20  Temp:    98.6 F (37 C)  TempSrc:      SpO2: 97% (!) 89% 97% 99%  Weight:      Height:         Body mass index is 29.11 kg/m.  Physical Exam   Constitutional: Appears well-developed and well-nourished.  Psych: Affect appropriate to situation, pleasant, cooperative  Eyes: No scleral injection HENT: No oropharyngeal obstruction.  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  Perfusing extremities well Respiratory: Effort normal, non-labored breathing GI: Soft.  No distension. There is no tenderness.  Skin: Warm dry and intact visible skin  Neurologic Examination   Mental Status: Patient is awake, alert, oriented to person, place, month, year, and situation. Patient is able to give a clear and coherent history. No signs of aphasia or neglect, much clearer speech Cranial Nerves: II: Visual Fields are full with some peripheral loss throughout but no field cut. Postsurgical pupils bilaterally  III,IV, VI: EOMI without ptosis or diploplia.  V: Facial sensation is symmetric to temperature and light touch VII: Facial movement with very subtle right facial droop  VIII: hearing is intact to voice XI: Shoulder shrug is symmetric. XII: tongue is midline without atrophy or fasciculations.  Motor: Tone is normal. Bulk is normal. 5/5 strength was present in all four extremities, except right foot cannot assess dorsi/plantar flexion due to surgical fixation. Slower fine finger movements of the right hand  Sensory: Slightly diminished in the right leg  and arm today per patient Deep Tendon Reflexes: 2+ and symmetric in the brachioradialis, 3+ right and 2+ left patellae.  Cerebellar: FNF and HKS are intact bilaterally but slower on the right Gait:  Deferred in acute setting    Medications  Current Facility-Administered Medications:     stroke: early stages of recovery book, , Does not apply, Once, Donyale Falcon L, MD   0.9 %  sodium chloride  infusion, , Intravenous, Continuous, Tukov-Yual, Magdalene S, NP, Last Rate: 75 mL/hr at 08/04/23 0600, Infusion Verify at 08/04/23 0600   acetaminophen  (TYLENOL ) tablet 650 mg, 650 mg, Oral, Q4H PRN, 650 mg at 08/04/23 0429 **OR** acetaminophen  (TYLENOL ) 160 MG/5ML solution 650 mg, 650 mg, Per Tube, Q4H PRN **OR** acetaminophen  (TYLENOL ) suppository 650 mg, 650 mg, Rectal, Q4H PRN, Camesha Farooq L, MD   albuterol  (PROVENTIL ) (2.5 MG/3ML) 0.083% nebulizer solution 2.5 mg, 2.5 mg, Inhalation, Q6H PRN, Tukov-Yual, Magdalene S, NP   atorvastatin  (LIPITOR) tablet 40 mg, 40 mg, Oral, QHS, Tukov-Yual, Magdalene S, NP, 40 mg at 08/03/23 2102   carvedilol  (COREG ) tablet 12.5 mg, 12.5 mg, Oral, BID WC, Tukov-Yual, Magdalene S, NP   Chlorhexidine Gluconate Cloth 2 % PADS 6 each, 6 each, Topical, Q0600, Kasa, Kurian, MD   hydrALAZINE (APRESOLINE) injection 10 mg, 10 mg, Intravenous, Q4H PRN, Keatyn Jawad L, MD   insulin aspart (novoLOG) injection 0-15 Units, 0-15 Units, Subcutaneous, Q4H, Tukov-Yual, Magdalene S, NP   labetalol (NORMODYNE) injection 10 mg, 10 mg, Intravenous, Q2H PRN, Clayton Jarmon, Ygnacio Hemming, MD  lisinopril (ZESTRIL) tablet 10 mg, 10 mg, Oral, Daily, Tukov-Yual, Magdalene S, NP, 10 mg at 08/03/23 2013   LORazepam (ATIVAN) injection 2 mg, 2 mg, Intravenous, Once PRN, Braylan Faul L, MD   mycophenolate  (MYFORTIC ) EC tablet 360 mg, 360 mg, Oral, BID, Tukov-Yual, Magdalene S, NP, 360 mg at 08/03/23 2103   pantoprazole  (PROTONIX ) injection 40 mg, 40 mg, Intravenous, QHS, Konnie Noffsinger L, MD, 40  mg at 08/03/23 2102   polyethylene glycol (MIRALAX / GLYCOLAX) packet 17 g, 17 g, Oral, Daily PRN, Tukov-Yual, Magdalene S, NP   predniSONE  (DELTASONE ) tablet 5 mg, 5 mg, Oral, Daily, Tukov-Yual, Magdalene S, NP, 5 mg at 08/03/23 2013   senna-docusate (Senokot-S) tablet 1 tablet, 1 tablet, Oral, QHS PRN, Aldred Mase L, MD   tacrolimus  (PROGRAF ) capsule 3 mg, 3 mg, Oral, BID, Tukov-Yual, Magdalene S, NP, 3 mg at 08/03/23 2103  Current Outpatient Medications  Medication Instructions   albuterol  (PROVENTIL  HFA;VENTOLIN  HFA) 108 (90 BASE) MCG/ACT inhaler 2 puffs, Inhalation, Every 6 hours PRN   amitriptyline  (ELAVIL ) 25 mg, 2 times daily   amLODipine  (NORVASC ) 5 mg, Daily   aspirin  EC 81 mg, Daily   atorvastatin  (LIPITOR) 40 mg, Daily   calcium -vitamin D  (OSCAL WITH D) 500-200 MG-UNIT tablet 1 tablet, Daily   carvedilol  (COREG ) 12.5 mg, 2 times daily with meals   cefTAZidime  2 g in sodium chloride  0.9 % 100 mL 2 g, Every 12 hours   Difluprednate (DUREZOL) 0.05 % EMUL 1 drop, Daily PRN   doxycycline  (VIBRA -TABS) 100 mg, 2 times daily   furosemide  (LASIX ) 20 mg, Daily   gabapentin (NEURONTIN) 100 mg, 2 times daily   HEPARIN  LOCK FLUSH IV 3 mLs, Daily   lisinopril (ZESTRIL) 10 mg, Daily   moxifloxacin (VIGAMOX) 0.5 % ophthalmic solution 1 drop, Daily PRN   Multiple Vitamin (MULTIVITAMIN) tablet 1 tablet, Daily   Myfortic  360 mg, 2 times daily   omeprazole (PRILOSEC) 40 mg, 2 times daily   polyethylene glycol (MIRALAX / GLYCOLAX) 17 g, Daily PRN   predniSONE  (DELTASONE ) 5 mg, Daily   Prograf  3 mg, 2 times daily   silver sulfADIAZINE (SILVADENE) 1 % cream 1 application , Daily     Labs and Diagnostic Imaging   CBC:  Recent Labs  Lab 08/03/23 1352  WBC 8.1  NEUTROABS 4.4  HGB 9.9*  HCT 31.4*  MCV 92.6  PLT 155    Basic Metabolic Panel:  Lab Results  Component Value Date   NA 138 08/03/2023   K 3.7 08/03/2023   CO2 16 (L) 08/03/2023   GLUCOSE 100 (H) 08/03/2023   BUN 40  (H) 08/03/2023   CREATININE 2.52 (H) 08/03/2023   CALCIUM  8.5 (L) 08/03/2023   GFRNONAA 22 (L) 08/03/2023   GFRAA 50 (L) 12/02/2017   Lipid Panel:  Lab Results  Component Value Date   CHOL 133 08/04/2023   HDL 69 08/04/2023   LDLCALC 58 08/04/2023   TRIG 31 08/04/2023   CHOLHDL 1.9 08/04/2023     HgbA1c:  Lab Results  Component Value Date   HGBA1C 5.4 04/02/2017   Urine Drug Screen:     Component Value Date/Time   LABOPIA NONE DETECTED 08/03/2023 2331   COCAINSCRNUR NONE DETECTED 08/03/2023 2331   LABBENZ NONE DETECTED 08/03/2023 2331   AMPHETMU NONE DETECTED 08/03/2023 2331   THCU NONE DETECTED 08/03/2023 2331   LABBARB NONE DETECTED 08/03/2023 2331    Alcohol Level     Component Value Date/Time   ETH <15  08/03/2023 1352   INR  Lab Results  Component Value Date   INR 1.1 08/03/2023   APTT  Lab Results  Component Value Date   APTT 176 (HH) 08/03/2023     CT Head without contrast(Personally reviewed): No acute intracranial process   MRI Brain(to be personally reviewed), MRA head: Pending  ECHO:   1. Left ventricular ejection fraction, by estimation, is 65 to 70%. The  left ventricle has normal function. The left ventricle has no regional  wall motion abnormalities. There is moderate concentric left ventricular  hypertrophy. Left ventricular  diastolic parameters are consistent with Grade I diastolic dysfunction  (impaired relaxation).   2. Right ventricular systolic function is normal. The right ventricular  size is normal.   3. The mitral valve is normal in structure. No evidence of mitral valve  regurgitation.   4. The aortic valve is tricuspid. There is mild thickening of the aortic  valve. Aortic valve regurgitation is not visualized.   5. The inferior vena cava is normal in size with greater than 50%  respiratory variability, suggesting right atrial pressure of 3 mmHg.   6. Agitated saline contrast bubble study was negative, with no evidence   of any interatrial shunt.  [Normal biatrial sizes]  Assessment   Kendra Schroeder is a 53 y.o. right handed female presenting with initially left MCA syndrome on EMS arrival, nearly resolving but with persistent significant disabling aphasia for which she received TNK on 6/8 at 1401  Unclear etiology of elevated APTT which is reported as collected at 1352 and per my recollection was drawn prior to TNK administration.  Resulted at 1529, after TNK had already been administered.  Possible erroneous result.  Recommendations   # Stroke as determined by clinical assessment - Stroke labs HgbA1c pending, A1c goal less than 7%,  - Meeting LDL goal less than 70, continue current therapy atorvastatin  40 milligrams - MRI brain 24 hours post TNK (pending at 2 PM today)  -Negative on my review, radiology report pending - MRA head completed, report pending - Carotid ultrasound - Hold antiplatelets / chemo DVT PPX for 24 hours until post TNK head imaging completed, home antiplatelet aspirin  81 mg daily, consider addition of Plavix pending completion of workup  -Addendum, MRI negative for hemorrhage on my review will start aspirin  81 mg daily and Plavix 300 mg x 1 days followed by 75 mg daily, course to be determined pending carotid vessel imaging - SCDs for now - Risk factor modification - Telemetry monitoring, if no arrhythmias captured, no critical left carotid stenosis, and no other indication for Saini-term anticoagulation during this hospitalization, as TIA sounded very embolic in nature based on clinical description would do Zio patch at minimum if not loop recorder - Blood pressure goal             - Post TNK for 24  hours < 180/105, then goal gradual normotension to be achieved over 3 to 5 days - PT consult, OT consult, Speech consult, unless patient is back to baseline - Neurology will follow  # APTT elevated result, possible erroneous result - Repeat  PTT ______________________________________________________________________   Geraldine Kling MD-PhD [facility] [phone]  CRITICAL CARE Performed by: Ronnette Coke   Total critical care time: 40 minutes  Critical care time was exclusive of separately billable procedures and treating other patients.  Critical care was necessary to treat or prevent imminent or life-threatening deterioration, close monitoring s/p TNK  Critical care was time  spent personally by me on the following activities: development of treatment plan with patient and/or surrogate as well as nursing, discussions with consultants, evaluation of patient's response to treatment, examination of patient, obtaining history from patient or surrogate, ordering and performing treatments and interventions, ordering and review of laboratory studies, ordering and review of radiographic studies, pulse oximetry and re-evaluation of patient's condition.

## 2023-08-04 NOTE — Progress Notes (Signed)
 OT Cancellation Note  Patient Details Name: Kendra Schroeder MRN: [id number] DOB: January 02, 1971   Cancelled Treatment:    Reason Eval/Treat Not Completed: Medical issues which prohibited therapy. Order received, chart reviewed. Pt received tNK at 1401 on 6/7 and is scheduled for follow up MRI at 1400 today. Will follow up after MRI completed if time allows for therapy evaluation.  Janaisa Birkland E Sophia Sperry 08/04/2023, 8:13 AM

## 2023-08-05 ENCOUNTER — Other Ambulatory Visit: Payer: Self-pay

## 2023-08-05 ENCOUNTER — Ambulatory Visit: Payer: Medicare (Managed Care) | Attending: Physician Assistant

## 2023-08-05 ENCOUNTER — Telehealth: Payer: Self-pay

## 2023-08-05 DIAGNOSIS — I639 Cerebral infarction, unspecified: Secondary | ICD-10-CM | POA: Diagnosis not present

## 2023-08-05 LAB — GLUCOSE, CAPILLARY: Glucose-Capillary: 81 mg/dL (ref 70–99)

## 2023-08-05 LAB — BASIC METABOLIC PANEL WITH GFR
Anion gap: 4 — ABNORMAL LOW (ref 5–15)
BUN: 38 mg/dL — ABNORMAL HIGH (ref 6–20)
CO2: 15 mmol/L — ABNORMAL LOW (ref 22–32)
Calcium: 8.4 mg/dL — ABNORMAL LOW (ref 8.9–10.3)
Chloride: 118 mmol/L — ABNORMAL HIGH (ref 98–111)
Creatinine, Ser: 2.36 mg/dL — ABNORMAL HIGH (ref 0.44–1.00)
GFR, Estimated: 24 mL/min — ABNORMAL LOW (ref >60)
Glucose, Bld: 93 mg/dL (ref 70–99)
Potassium: 4.3 mmol/L (ref 3.5–5.1)
Sodium: 137 mmol/L (ref 135–145)

## 2023-08-05 LAB — APTT: aPTT: 37 s — ABNORMAL HIGH (ref 24–36)

## 2023-08-05 LAB — HEMOGLOBIN A1C
Hgb A1c MFr Bld: 6.6 % — ABNORMAL HIGH (ref 4.8–5.6)
Mean Plasma Glucose: 143 mg/dL

## 2023-08-05 MED ORDER — CLOPIDOGREL BISULFATE 75 MG PO TABS: 75.0000 mg | ORAL_TABLET | Freq: Every day | ORAL | 0 refills | Status: AC

## 2023-08-05 MED ORDER — ASPIRIN 81 MG PO TBEC: 81.0000 mg | DELAYED_RELEASE_TABLET | Freq: Every day | ORAL | 1 refills | Status: AC

## 2023-08-05 MED ORDER — AMLODIPINE BESYLATE 5 MG PO TABS: 5.0000 mg | ORAL_TABLET | Freq: Every day | ORAL | 1 refills | Status: AC

## 2023-08-05 MED ORDER — HEPARIN SOD (PORK) LOCK FLUSH 100 UNIT/ML IV SOLN
500.0000 [IU] | INTRAVENOUS | Status: AC | PRN
  Administered 2023-08-05: 500 [IU]

## 2023-08-05 NOTE — Progress Notes (Signed)
 [facility]  ROUNDING NOTE   Subjective:   Patient seen sitting up in bed, eating breakfast Alert and oriented Partially completed breakfast tray at bedside, denies nausea or vomiting Room air No lower extremity edema  Creatinine 2.36  Objective:  Vital signs in last 24 hours:  Temp:  [98.2 F (36.8 C)-98.6 F (37 C)] 98.5 F (36.9 C) (06/09 0400) Pulse Rate:  [80-124] 85 (06/09 0700) Resp:  [0-32] 23 (06/09 0700) BP: (107-165)/(51-94) 165/62 (06/09 0700) SpO2:  [90 %-100 %] 98 % (06/09 0700)  Weight change:  Filed Weights   08/03/23 1356 08/03/23 1405 08/03/23 1452  Weight: 74 kg 74 kg 72.2 kg    Intake/Output: I/O last 3 completed shifts: In: 1951.2 [P.O.:240; I.V.:1711.2] Out: -    Intake/Output this shift:  No intake/output data recorded.  Physical Exam: General: NAD  Head: Normocephalic, atraumatic. Moist oral mucosal membranes  Eyes: Anicteric  Lungs:  Clear to auscultation, normal effort  Heart: Regular rate and rhythm  Abdomen:  Soft, nontender  Extremities: No peripheral edema.  Neurologic: Awake, alert, conversant  Skin: Warm,dry, no rash       Basic Metabolic Panel: Recent Labs  Lab 08/03/23 1352 08/04/23 0728 08/05/23 0510  NA 138 137 137  K 3.7 4.5 4.3  CL 115* 116* 118*  CO2 16* 16* 15*  GLUCOSE 100* 106* 93  BUN 40* 36* 38*  CREATININE 2.52* 2.08* 2.36*  CALCIUM  8.5* 8.4* 8.4*    Liver Function Tests: Recent Labs  Lab 08/03/23 1352  AST 17  ALT 24  ALKPHOS 52  BILITOT 1.0  PROT 5.8*  ALBUMIN  3.1*   No results for input(s): "LIPASE", "AMYLASE" in the last 168 hours. No results for input(s): "AMMONIA" in the last 168 hours.  CBC: Recent Labs  Lab 08/03/23 1352  WBC 8.1  NEUTROABS 4.4  HGB 9.9*  HCT 31.4*  MCV 92.6  PLT 155    Cardiac Enzymes: Recent Labs  Lab 08/03/23 1950  CKTOTAL 38    BNP: Invalid input(s): "POCBNP"  CBG: Recent Labs  Lab 08/03/23 1334 08/03/23 1458 08/03/23 2333  08/05/23 0731  GLUCAP 90 86 122* 81    Microbiology: Results for orders placed or performed during the hospital encounter of 08/03/23  MRSA Next Gen by PCR, Nasal     Status: None   Collection Time: 08/03/23  2:55 PM   Specimen: Nasal Mucosa; Nasal Swab  Result Value Ref Range Status   MRSA by PCR Next Gen NOT DETECTED NOT DETECTED Final    Comment: (NOTE) The GeneXpert MRSA Assay (FDA approved for NASAL specimens only), is one component of a comprehensive MRSA colonization surveillance program. It is not intended to diagnose MRSA infection nor to guide or monitor treatment for MRSA infections. Test performance is not FDA approved in patients less than 9 years old. Performed at [facility], [address] [place], [place] [address]     Coagulation Studies: Recent Labs    08/03/23 1352  LABPROT 14.4  INR 1.1    Urinalysis: No results for input(s): "COLORURINE", "LABSPEC", "PHURINE", "GLUCOSEU", "HGBUR", "BILIRUBINUR", "KETONESUR", "PROTEINUR", "UROBILINOGEN", "NITRITE", "LEUKOCYTESUR" in the last 72 hours.  Invalid input(s): "APPERANCEUR"    Imaging: MR ANGIO HEAD WO CONTRAST Result Date: 08/04/2023 EXAM: MR Angiography Head without intravenous Contrast. 08/04/2023 02:38:54 PM TECHNIQUE: Magnetic resonance angiography images of the head without intravenous contrast. Three-dimensional MIP reformations performed. COMPARISON: None provided. CLINICAL HISTORY: Neuro deficit, acute, stroke suspected. 24 hrs post TNK; 52 y.o.  Per EMS, patient was talking with a friend around 1 PM when she suddenly seemed to have some difficulty finding words with weakness on the right side of her body. Per EMS, patient had waxing and waning weakness in her right side during transport, did seem to consistently have difficulty speaking. She was also noted to have difficulty walking when EMS tried to stand her up to get her to the stretcher. FINDINGS: ANTERIOR CIRCULATION: No significant  stenosis of the internal carotid arteries. No significant stenosis of the anterior cerebral arteries. No significant stenosis of the middle cerebral arteries. No aneurysm. Prominent posterior communicating arteries are present bilaterally, a normal variant. POSTERIOR CIRCULATION: No significant stenosis of the posterior cerebral arteries. No significant stenosis of the basilar artery. No significant stenosis of the vertebral arteries. No aneurysm. IMPRESSION: 1. Normal mra of the head. Electronically signed by: Audree Leas MD 08/04/2023 05:38 PM EDT RP Workstation: [id number]   MR BRAIN WO CONTRAST Result Date: 08/04/2023 EXAM: MRI BRAIN WITHOUT CONTRAST 08/04/2023 02:38:54 PM TECHNIQUE: Multiplanar multisequence MRI of the head/brain was performed without the administration of intravenous contrast. COMPARISON: CT head without contrast 08/03/2023. CLINICAL HISTORY: Neuro deficit, acute, stroke suspected; s/p TNK for aphasia and right sided weakness, 24 hr scan ~1400 on 6/8. 24 hrs post TNK; 85 y.o. Per EMS, patient was talking with a friend around 1 PM when she suddenly seemed to have some difficulty finding words with weakness on the right side of her body. Per EMS, patient had waxing and waning weakness in her right side during transport, did seem to consistently have difficulty speaking. She was also noted to have difficulty walking when EMS tried to stand her up to get her to the stretcher. FINDINGS: BRAIN AND VENTRICLES: No acute infarct. No intracranial hemorrhage. No mass. No midline shift. No hydrocephalus. The sella is unremarkable. Normal flow voids. Mild generalized atrophy is present. Periventricular white matter T2 hyperintensities are moderately advanced for age. A remote lacunar infarct is present in the right paramedian pons. A remote lacunar infarct is present in the left cerebellar white matter. ORBITS: Bilateral lens replacements are noted. The globes and orbits are otherwise within  normal limits. SINUSES AND MASTOIDS: No acute abnormality. BONES AND SOFT TISSUES: Normal marrow signal. No acute soft tissue abnormality. IMPRESSION: 1. No acute infarct or intracranial hemorrhage. 2. Mild generalized atrophy and moderately advanced periventricular white matter T2 hyperintensities for age. 3. Remote lacunar infarcts in the right paramedian pons and left cerebellar white matter. Electronically signed by: Audree Leas MD 08/04/2023 05:36 PM EDT RP Workstation: [id number]   US  Carotid Bilateral Result Date: 08/04/2023 CLINICAL DATA:  TIA symptoms, hypertension, diabetes EXAM: BILATERAL CAROTID DUPLEX ULTRASOUND TECHNIQUE: Martina Sledge scale imaging, color Doppler and duplex ultrasound were performed of bilateral carotid and vertebral arteries in the neck. COMPARISON:  None Available. FINDINGS: Criteria: Quantification of carotid stenosis is based on velocity parameters that correlate the residual internal carotid diameter with NASCET-based stenosis levels, using the diameter of the distal internal carotid lumen as the denominator for stenosis measurement. The following velocity measurements were obtained: RIGHT ICA: 85/22 cm/sec CCA: 58/13 cm/sec SYSTOLIC ICA/CCA RATIO:  1.5 ECA: 107 cm/sec LEFT ICA: 116/27 cm/sec CCA: 55/11 cm/sec SYSTOLIC ICA/CCA RATIO:  2.1 ECA: 92 cm/sec RIGHT CAROTID ARTERY: Minor intimal thickening and thin hypoechoic plaque formation. No hemodynamically significant right ICA stenosis, velocity elevation, or turbulent flow. Degree of narrowing less than 50%. RIGHT VERTEBRAL ARTERY:  Normal antegrade flow LEFT CAROTID ARTERY: Similar intimal thickening and bifurcation  calcified plaque formation. No hemodynamically significant left ICA stenosis, velocity elevation, or turbulent flow. LEFT VERTEBRAL ARTERY:  Normal antegrade flow IMPRESSION: 1. Minor carotid atherosclerosis. Negative for significant stenosis. Degree of narrowing less than 50% bilaterally by ultrasound criteria. 2.  Patent antegrade vertebral flow bilaterally. Electronically Signed   By: Melven Stable.  Shick M.D.   On: 08/04/2023 16:31   US  RENAL Result Date: 08/04/2023 CLINICAL DATA:  Renal failure, transplant. EXAM: RENAL / URINARY TRACT ULTRASOUND COMPLETE COMPARISON:  None Available. FINDINGS: Native kidneys are small and echogenic measuring 6.7 cm on the left and 6.2 cm on the right. No renal parenchymal lesions or hydronephrosis. There are 2 transplanted kidneys in the left lower quadrant. One is unremarkable measuring 9.1 cm with no hydronephrosis or renal parenchymal abnormalities. The second transplanted kidney is small and echogenic. Urinary bladder was not imaged. IMPRESSION: 1. Small echogenic native kidneys. 2. Unremarkable transplanted kidney in the left lower quadrant. 3. Small echogenic transplanted kidney in the left lower quadrant. Electronically Signed   By: Sydell Eva M.D.   On: 08/04/2023 12:59   ECHOCARDIOGRAM COMPLETE Result Date: 08/04/2023    ECHOCARDIOGRAM REPORT   Patient Name:   Kendra Schroeder Date of Exam: 08/03/2023 Medical Rec #:  [id number]        Height:       62.0 in Accession #:    [id number]       Weight:       159.2 lb Date of Birth:  1970-03-03         BSA:          1.735 m Patient Age:    52 years         BP:           165/84 mmHg Patient Gender: F                HR:           90 bpm. Exam Location:  [facility] Procedure: 2D Echo, Cardiac Doppler, Color Doppler and Saline Contrast Bubble            Study (Both Spectral and Color Flow Doppler were utilized during            procedure). Indications:     Stroke I63.9  History:         Patient has prior history of Echocardiogram examinations, most                  recent 12/02/2017.  Sonographer:     Clenton Czech RDCS, FASE Referring Phys:  [id number] Ygnacio Hemming BHAGAT Diagnosing Phys: Lida Reeks Alluri IMPRESSIONS  1. Left ventricular ejection fraction, by estimation, is 65 to 70%. The left ventricle has normal function. The left ventricle has no regional  wall motion abnormalities. There is moderate concentric left ventricular hypertrophy. Left ventricular diastolic parameters are consistent with Grade I diastolic dysfunction (impaired relaxation).  2. Right ventricular systolic function is normal. The right ventricular size is normal.  3. The mitral valve is normal in structure. No evidence of mitral valve regurgitation.  4. The aortic valve is tricuspid. There is mild thickening of the aortic valve. Aortic valve regurgitation is not visualized.  5. The inferior vena cava is normal in size with greater than 50% respiratory variability, suggesting right atrial pressure of 3 mmHg.  6. Agitated saline contrast bubble study was negative, with no evidence of any interatrial shunt. FINDINGS  Left Ventricle: Left ventricular ejection fraction, by estimation, is 65 to  70%. The left ventricle has normal function. The left ventricle has no regional wall motion abnormalities. The left ventricular internal cavity size was normal in size. There is  moderate concentric left ventricular hypertrophy. Left ventricular diastolic parameters are consistent with Grade I diastolic dysfunction (impaired relaxation). Right Ventricle: The right ventricular size is normal. No increase in right ventricular wall thickness. Right ventricular systolic function is normal. Left Atrium: Left atrial size was normal in size. Right Atrium: Right atrial size was normal in size. Pericardium: There is no evidence of pericardial effusion. Mitral Valve: The mitral valve is normal in structure. No evidence of mitral valve regurgitation. Tricuspid Valve: The tricuspid valve is normal in structure. Tricuspid valve regurgitation is trivial. Aortic Valve: The aortic valve is tricuspid. There is mild thickening of the aortic valve. Aortic valve regurgitation is not visualized. Aortic valve peak gradient measures 6.5 mmHg. Pulmonic Valve: The pulmonic valve was not well visualized. Pulmonic valve regurgitation is  not visualized. Aorta: The aortic root and ascending aorta are structurally normal, with no evidence of dilitation. Venous: The inferior vena cava is normal in size with greater than 50% respiratory variability, suggesting right atrial pressure of 3 mmHg. IAS/Shunts: The atrial septum is grossly normal. Agitated saline contrast was given intravenously to evaluate for intracardiac shunting. Agitated saline contrast bubble study was negative, with no evidence of any interatrial shunt.  LEFT VENTRICLE PLAX 2D LVIDd:         2.60 cm   Diastology LVIDs:         1.80 cm   LV e' medial:    5.66 cm/s LV PW:         1.40 cm   LV E/e' medial:  14.5 LV IVS:        1.40 cm   LV e' lateral:   6.42 cm/s LVOT diam:     1.90 cm   LV E/e' lateral: 12.8 LV SV:         41 LV SV Index:   24 LVOT Area:     2.84 cm  RIGHT VENTRICLE RV Basal diam:  2.30 cm RV S prime:     11.30 cm/s TAPSE (M-mode): 1.9 cm LEFT ATRIUM           Index        RIGHT ATRIUM           Index LA diam:      2.80 cm 1.61 cm/m   RA Area:     12.30 cm LA Vol (A2C): 19.5 ml 11.24 ml/m  RA Volume:   28.60 ml  16.49 ml/m LA Vol (A4C): 20.0 ml 11.53 ml/m  AORTIC VALVE                 PULMONIC VALVE AV Area (Vmax): 1.83 cm     PV Vmax:        0.88 m/s AV Vmax:        127.50 cm/s  PV Peak grad:   3.1 mmHg AV Peak Grad:   6.5 mmHg     RVOT Peak grad: 2 mmHg LVOT Vmax:      82.20 cm/s LVOT Vmean:     52.300 cm/s LVOT VTI:       0.146 m  AORTA Ao Root diam: 2.90 cm Ao Asc diam:  2.70 cm MITRAL VALVE MV Area (PHT): 5.42 cm    SHUNTS MV Decel Time: 140 msec    Systemic VTI:  0.15 m MV E velocity: 82.30 cm/s  Systemic Diam: 1.90 cm MV A velocity: 70.70 cm/s MV E/A ratio:  1.16 Joetta Mustache Electronically signed by Joetta Mustache Signature Date/Time: 08/04/2023/7:55:35 AM    Final    CT HEAD CODE STROKE WO CONTRAST Result Date: 08/03/2023 CLINICAL DATA:  Code stroke.  Neuro deficit, concern for stroke. EXAM: CT HEAD WITHOUT CONTRAST TECHNIQUE: Contiguous axial images  were obtained from the base of the skull through the vertex without intravenous contrast. RADIATION DOSE REDUCTION: This exam was performed according to the departmental dose-optimization program which includes automated exposure control, adjustment of the mA and/or kV according to patient size and/or use of iterative reconstruction technique. COMPARISON:  CT head 09/23/2018. FINDINGS: Brain: No acute intracranial hemorrhage. No CT evidence of acute infarct. Nonspecific hypoattenuation in the periventricular and subcortical white matter favored to reflect chronic microvascular ischemic changes. No edema, mass effect, or midline shift. The basilar cisterns are patent. Ventricles: The ventricles are normal. Vascular: Atherosclerotic calcifications of the carotid siphons and intracranial vertebral arteries. No hyperdense vessel. Skull: No acute or aggressive finding. Orbits: Orbits are symmetric. Sinuses: The visualized paranasal sinuses are clear. Other: Mastoid air cells are clear. ASPECTS [facility] Stroke Program Early CT Score) - Ganglionic level infarction (caudate, lentiform nuclei, internal capsule, insula, M1-M3 cortex): 7 - Supraganglionic infarction (M4-M6 cortex): 3 Total score (0-10 with 10 being normal): 10 IMPRESSION: 1. No CT evidence of acute intracranial abnormality. 2. Mild chronic microvascular ischemic changes. 3. ASPECTS is 10 These results were communicated to Dr. Cleone Dad at 1:51 pm on 08/03/2023 by text page via the [facility] messaging system. Electronically Signed   By: Denny Flack M.D.   On: 08/03/2023 13:51     Medications:     aspirin  EC  81 mg Oral Daily   atorvastatin   40 mg Oral QHS   Chlorhexidine Gluconate Cloth  6 each Topical Q0600   clopidogrel  75 mg Oral Daily   doxycycline   100 mg Oral BID   enoxaparin (LOVENOX) injection  30 mg Subcutaneous Q24H   insulin aspart  0-15 Units Subcutaneous Q4H   mycophenolate   360 mg Oral BID   pantoprazole  (PROTONIX ) IV  40 mg Intravenous  QHS   predniSONE   5 mg Oral Daily   tacrolimus   3 mg Oral BID   acetaminophen  **OR** acetaminophen  (TYLENOL ) oral liquid 160 mg/5 mL **OR** acetaminophen , albuterol , hydrALAZINE, labetalol, LORazepam, polyethylene glycol, senna-docusate  Assessment/ Plan:  [name] is a 53 y.o.  female with a PMHx of hypertension, diabetes, peripheral arterial disease, history of simultaneous kidney pancreas transplant.  She is now admitted with history of sudden onset of right-sided weakness and expressive aphasia.  She was given TNK on arrival in the emergency room.  She made significant improvement in her speech and also weakness. She has chronic kidney disease with a baseline creatinine of about 2.  She had renal transplant in 1998 and 2010.  She has been on tacrolimus , Myfortic  and prednisone .   Acute kidney injury on chronic kidney disease stage IIIbt: Acute kidney injury most likely secondary to prerenal azotemia. Patient has chronic kidney disease with a baseline creatinine of about 2.  Renal ultrasound negative for obstruction.  Renal function remained stable.  Continue immunosuppressive's at this time, tacrolimus , Myfortic , and prednisone .  Lab Results  Component Value Date   CREATININE 2.36 (H) 08/05/2023   CREATININE 2.08 (H) 08/04/2023   CREATININE 2.52 (H) 08/03/2023    Intake/Output Summary (Last 24 hours) at 08/05/2023 1008 Last data filed at 08/04/2023 2000  Gross per 24 hour  Intake 932.59 ml  Output --  Net 932.59 ml    2. Anemia with chronic kidney disease.  Hemoglobin 9.9 at last check on 6/7.   3: Status post renal transplant: We will continue the tacrolimus , Myfortic  and prednisone  at the present doses.    4: Hypertension: As outpatient she has been on amlodipine  and lisinopril with furosemide .  Will continue the labetalol and hydralazine on as-needed basis.  Blood pressure slightly elevated, 154/84.  May consider restarting home medications.   5: Cerebrovascular  accident: Acute CVA status post tenecteplase infusion with immediate recovery.    LOS: 2 Swayze Kozuch 6/9/202510:08 AM

## 2023-08-05 NOTE — Telephone Encounter (Signed)
-----   Message from Brodie Cannon sent at 08/05/2023  1:01 PM EDT ----- This patient is being discharged from [facility] today. Can you please order a 14 day ZIO XT for CVA to be sent to her house? Thank you.

## 2023-08-05 NOTE — Telephone Encounter (Signed)
 Monitor ordered for 14 days as requested.   Thank you!

## 2023-08-05 NOTE — Progress Notes (Signed)
 OT Cancellation Note  Patient Details Name: Kendra Schroeder MRN: [id number] DOB: 1970/08/11   Cancelled Treatment:    Reason Eval/Treat Not Completed: OT screened, no needs identified, will sign off. Chart reviewed, pt received sitting EOB getting dressed for discharge. Pt declines participation in OT eval at this time, states she is close to functional baseline and denies sensation or strength changes. OT to complete orders, thank you for the referral.   [facility] L. Adaria Hole, OTR/L  08/05/23, 1:55 PM

## 2023-08-05 NOTE — Progress Notes (Signed)
 SLP Cancellation Note  Patient Details Name: Kendra Schroeder MRN: [id number] DOB: 1970/06/10   Cancelled treatment:       Reason Eval/Treat Not Completed: SLP screened, no needs identified, will sign off. Chart review completed. Neurologist progress note reporting, "much improved speech, back to baseline."   Pt seen for screen by SLP- pt denied issue with expressive language, reporting that language is now at baseline. Education shared regarding asking for speech consult in the future if she determines difference at later time. No further acute SLP services indicated.   [place] Lisanne Ponce Clapp, MS, CCC-SLP Speech Language Pathologist Rehab Services; [facility] [phone] (ascom)    [place] J Clapp 08/05/2023, 9:58 AM

## 2023-08-05 NOTE — Progress Notes (Signed)
 NEUROLOGY CONSULT FOLLOW UP NOTE   Date of service: August 05, 2023 Patient Name: Kendra Schroeder MRN:  [id number] DOB:  1970-08-15  Interval Hx/subjective   - no significant changes - she remembers the episode and having trouble speaking.  - no previous epiosdes of lost time, staring spells, LOC, or other concerning episodes.    Vitals   Vitals:   08/05/23 0800 08/05/23 0900 08/05/23 1000 08/05/23 1200  BP: (!) 154/84 (!) 141/82 (!) 146/76 (!) 151/83  Pulse: 84 87 87 87  Resp: 17 (!) 21 (!) 22 (!) 27  Temp: 98.2 F (36.8 C)   97.9 F (36.6 C)  TempSrc:      SpO2: 100% 98% 97% 100%  Weight:      Height:         Body mass index is 29.11 kg/m.  Physical Exam   Constitutional: Appears well-developed and well-nourished.  Psych: Affect appropriate to situation, pleasant, cooperative  Eyes: No scleral injection HENT: No oropharyngeal obstruction.  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  Perfusing extremities well Respiratory: Effort normal, non-labored breathing GI: Soft.  No distension. There is no tenderness.  Skin: Warm dry and intact visible skin  Neurologic Examination   Mental Status: Patient is awake, alert, oriented to person, place, month, year, and situation. Patient is able to give a clear and coherent history. No signs of aphasia or neglect Cranial Nerves: II: Visual Fields are full with some peripheral loss throughout but no field cut. Postsurgical pupils bilaterally  III,IV, VI: EOMI without ptosis or diploplia.  V: Facial sensation is symmetric to temperature and light touch VII: Facial movement symmetric VIII: hearing is intact to voice XI: Shoulder shrug is symmetric. XII: tongue is midline without atrophy or fasciculations.  Motor: Tone is normal. Bulk is normal. 5/5 strength was present in all four extremities, except right foot cannot assess dorsi/plantar flexion due to surgical fixation.   Sensory: Slightly diminished in the right leg and arm today per patient    Medications  Current Facility-Administered Medications:    acetaminophen  (TYLENOL ) tablet 650 mg, 650 mg, Oral, Q4H PRN, 650 mg at 08/04/23 0429 **OR** acetaminophen  (TYLENOL ) 160 MG/5ML solution 650 mg, 650 mg, Per Tube, Q4H PRN **OR** acetaminophen  (TYLENOL ) suppository 650 mg, 650 mg, Rectal, Q4H PRN, Bhagat, Srishti L, MD   albuterol  (PROVENTIL ) (2.5 MG/3ML) 0.083% nebulizer solution 2.5 mg, 2.5 mg, Inhalation, Q6H PRN, Tukov-Yual, Magdalene S, NP   aspirin  EC tablet 81 mg, 81 mg, Oral, Daily, Bhagat, Srishti L, MD, 81 mg at 08/05/23 0930   atorvastatin  (LIPITOR) tablet 40 mg, 40 mg, Oral, QHS, Tukov-Yual, Magdalene S, NP, 40 mg at 08/04/23 2123   Chlorhexidine Gluconate Cloth 2 % PADS 6 each, 6 each, Topical, Q0600, Kasa, Kurian, MD, 6 each at 08/05/23 0931   [COMPLETED] clopidogrel (PLAVIX) tablet 300 mg, 300 mg, Oral, Once, 300 mg at 08/04/23 1650 **FOLLOWED BY** clopidogrel (PLAVIX) tablet 75 mg, 75 mg, Oral, Daily, Bhagat, Srishti L, MD, 75 mg at 08/05/23 0930   doxycycline  (VIBRA -TABS) tablet 100 mg, 100 mg, Oral, BID, Wouk, Haynes Lips, MD, 100 mg at 08/05/23 0931   enoxaparin (LOVENOX) injection 30 mg, 30 mg, Subcutaneous, Q24H, Wouk, Haynes Lips, MD, 30 mg at 08/04/23 1806   hydrALAZINE (APRESOLINE) injection 10 mg, 10 mg, Intravenous, Q4H PRN, Bhagat, Srishti L, MD   insulin aspart (novoLOG) injection 0-15 Units, 0-15 Units, Subcutaneous, Q4H, Tukov-Yual, Magdalene S, NP   labetalol (NORMODYNE)  injection 10 mg, 10 mg, Intravenous, Q2H PRN, Bhagat, Srishti L, MD   LORazepam (ATIVAN) injection 2 mg, 2 mg, Intravenous, Q30 min PRN, Bhagat, Srishti L, MD, 2 mg at 08/04/23 1340   mycophenolate  (MYFORTIC ) EC tablet 360 mg, 360 mg, Oral, BID, Tukov-Yual, Magdalene S, NP, 360 mg at 08/05/23 0931   pantoprazole  (PROTONIX ) injection 40 mg, 40 mg, Intravenous, QHS, Bhagat, Srishti L, MD, 40 mg at 08/04/23 2124    polyethylene glycol (MIRALAX / GLYCOLAX) packet 17 g, 17 g, Oral, Daily PRN, Tukov-Yual, Magdalene S, NP   predniSONE  (DELTASONE ) tablet 5 mg, 5 mg, Oral, Daily, Tukov-Yual, Magdalene S, NP, 5 mg at 08/05/23 0930   senna-docusate (Senokot-S) tablet 1 tablet, 1 tablet, Oral, QHS PRN, Bhagat, Srishti L, MD   tacrolimus  (PROGRAF ) capsule 3 mg, 3 mg, Oral, BID, Tukov-Yual, Magdalene S, NP, 3 mg at 08/05/23 0930  Current Outpatient Medications  Medication Instructions   albuterol  (PROVENTIL  HFA;VENTOLIN  HFA) 108 (90 BASE) MCG/ACT inhaler 2 puffs, Inhalation, Every 6 hours PRN   amitriptyline  (ELAVIL ) 25 mg, 2 times daily   amLODipine  (NORVASC ) 5 mg, Daily   aspirin  EC 81 mg, Daily   atorvastatin  (LIPITOR) 40 mg, Daily   calcium -vitamin D  (OSCAL WITH D) 500-200 MG-UNIT tablet 1 tablet, Daily   carvedilol  (COREG ) 12.5 mg, 2 times daily with meals   cefTAZidime  2 g in sodium chloride  0.9 % 100 mL 2 g, Every 12 hours   Difluprednate (DUREZOL) 0.05 % EMUL 1 drop, Daily PRN   doxycycline  (VIBRA -TABS) 100 mg, 2 times daily   furosemide  (LASIX ) 20 mg, Daily   gabapentin (NEURONTIN) 100 mg, 2 times daily   HEPARIN  LOCK FLUSH IV 3 mLs, Daily   lisinopril (ZESTRIL) 10 mg, Daily   moxifloxacin (VIGAMOX) 0.5 % ophthalmic solution 1 drop, Daily PRN   Multiple Vitamin (MULTIVITAMIN) tablet 1 tablet, Daily   Myfortic  360 mg, 2 times daily   omeprazole (PRILOSEC) 40 mg, 2 times daily   polyethylene glycol (MIRALAX / GLYCOLAX) 17 g, Daily PRN   predniSONE  (DELTASONE ) 5 mg, Daily   Prograf  3 mg, 2 times daily   silver sulfADIAZINE (SILVADENE) 1 % cream 1 application , Daily     Labs and Diagnostic Imaging   CBC:  Recent Labs  Lab 08/03/23 1352  WBC 8.1  NEUTROABS 4.4  HGB 9.9*  HCT 31.4*  MCV 92.6  PLT 155    Basic Metabolic Panel:  Lab Results  Component Value Date   NA 137 08/05/2023   K 4.3 08/05/2023   CO2 15 (L) 08/05/2023   GLUCOSE 93 08/05/2023   BUN 38 (H) 08/05/2023   CREATININE  2.36 (H) 08/05/2023   CALCIUM  8.4 (L) 08/05/2023   GFRNONAA 24 (L) 08/05/2023   GFRAA 50 (L) 12/02/2017   Lipid Panel:  Lab Results  Component Value Date   CHOL 133 08/04/2023   HDL 69 08/04/2023   LDLCALC 58 08/04/2023   TRIG 31 08/04/2023   CHOLHDL 1.9 08/04/2023     HgbA1c:  Lab Results  Component Value Date   HGBA1C 6.6 (H) 08/03/2023   Urine Drug Screen:     Component Value Date/Time   LABOPIA NONE DETECTED 08/03/2023 2331   COCAINSCRNUR NONE DETECTED 08/03/2023 2331   LABBENZ NONE DETECTED 08/03/2023 2331   AMPHETMU NONE DETECTED 08/03/2023 2331   THCU NONE DETECTED 08/03/2023 2331   LABBARB NONE DETECTED 08/03/2023 2331    Alcohol Level     Component Value Date/Time   ETH <15  08/03/2023 1352   INR  Lab Results  Component Value Date   INR 1.1 08/03/2023   APTT  Lab Results  Component Value Date   APTT 37 (H) 08/05/2023     CT Head without contrast(Personally reviewed): No acute intracranial process   MRI Brain(to be personally reviewed), MRA head: Pending  ECHO:   1. Left ventricular ejection fraction, by estimation, is 65 to 70%. The  left ventricle has normal function. The left ventricle has no regional  wall motion abnormalities. There is moderate concentric left ventricular  hypertrophy. Left ventricular  diastolic parameters are consistent with Grade I diastolic dysfunction  (impaired relaxation).   2. Right ventricular systolic function is normal. The right ventricular  size is normal.   3. The mitral valve is normal in structure. No evidence of mitral valve  regurgitation.   4. The aortic valve is tricuspid. There is mild thickening of the aortic  valve. Aortic valve regurgitation is not visualized.   5. The inferior vena cava is normal in size with greater than 50%  respiratory variability, suggesting right atrial pressure of 3 mmHg.   6. Agitated saline contrast bubble study was negative, with no evidence  of any interatrial shunt.   [Normal biatrial sizes]  Assessment   Kendra Schroeder is a 53 y.o. 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 MRI is negative and symptoms have resolved.   Recommendations   # Stroke as determined by clinical assessment - Meeting LDL goal less than 70, continue current therapy atorvastatin  40 milligrams - asa 81 mg daily and Plavix 300 mg x 1 days followed by 75 mg daily for three weeks followed by asa 81mg  daily - with no clear etiology, would favor prolonged cardiac monitor with zio patch or equivalent - neurology will be available as needed.  ______________________________________________________________________  Ann Keto, MD [facility]   If 7pm- 7am, please page neurology on call as listed in [facility].

## 2023-08-05 NOTE — Discharge Summary (Signed)
 Kendra Schroeder YNW:[id number] DOB: [birth year] DOA: 08/03/2023  PCP: Sims Duck, MD  Admit date: 08/03/2023 Discharge date: 08/05/2023  Time spent: 35 minutes  Recommendations for Outpatient Follow-up:  Pcp f/u 1-2 weeks, check bmp the Attention to blood pressure at f/u, consider re-starting lisinopril if kidney function improves Close neurology and nephrology f/u as well     Discharge Diagnoses:  Principal Problem:   Stroke determined by clinical assessment (HCC) Active Problems:   HTN (hypertension)   Kidney transplant recipient   Stage 3a chronic kidney disease (HCC)   T1DM (type 1 diabetes mellitus) (HCC)   PAD (peripheral artery disease) (HCC)   History of pancreas transplant [facility])   Discharge Condition: stable  Diet recommendation: heart healthy  Filed Weights   08/03/23 1356 08/03/23 1405 08/03/23 1452  Weight: 74 kg 74 kg 72.2 kg    History of present illness:  From admission h and p This is a 53 year old with multiple stroke risk factors who presented with sudden onset right-sided flaccidity, ataxia and expressive aphasia. Upon EMS arrival, patient was nonverbal and her right side was completely flaccid. A code stroke was activated. Upon arrival in the ED, patient remained flaccid on the right side with moderate expressive aphasia and persistent ataxia. Her initial CT head was negative however based on her clinical presentation and multiple risk factors as well as her young age, TNK was administered. Her symptoms have improved. Her current NIH stroke score is 0. She is being admitted to the ICU for further management.   Hospital Course:   # Acute CVA With aphasia and right sided weakness. Clinical diagnosis, CT head negative. Received TNK. Symptoms resolved. TTE unremarkable. Ldl is 58. Carotid dopplers, MRA of brain, MRI brain nothing acute but does show evidence prior lacunar stroke. Is back to baseline, no PT needs - continue aspirin  and statin - plavix 21 days -  ziopatch to be mailed to patient's home - f/u [facility] (already established there)   # T1DM S/p pancreas transplant, not on meds   # HTN Permissive htn here - resume home coreg , resume prior amlodipine , hold lisinopril given aki - pcp f/u 1 week   # AKI on ckd 3b # Renal transplant status Recent baseline cr seems to be around 1.8, here in the 2s, renal u/s no obstruction, uop adequate - holding home lisinopril pending outpt f/u with repeat bmp - close nephrology f/u - continue home myfortic , prednisone , tacrolimus  for now   # Charcot foot # Chronic osteomyelitis - continue home doxy  Procedures: none   Consultations: Neurology, nephrology  Discharge Exam: Vitals:   08/05/23 1000 08/05/23 1200  BP: (!) 146/76 (!) 151/83  Pulse: 87 87  Resp: (!) 22 (!) 27  Temp:  97.9 F (36.6 C)  SpO2: 97% 100%    General: NAD Cardiovascular: RRR Respiratory: CTAB  Discharge Instructions   Discharge Instructions     Diet - low sodium heart healthy   Complete by: As directed    Increase activity slowly   Complete by: As directed       Allergies as of 08/05/2023       Reactions   Cefepime Other (See Comments)   Altered mental status, aphasia   Percocet [oxycodone -acetaminophen ] Other (See Comments)   Confusion, dizziness        Medication List     PAUSE taking these medications    furosemide  20 MG tablet Wait to take this until your doctor or other care provider tells  you to start again. Commonly known as: LASIX  Take 20 mg by mouth daily.   lisinopril 10 MG tablet Wait to take this until your doctor or other care provider tells you to start again. Commonly known as: ZESTRIL Take 10 mg by mouth daily.   omeprazole 40 MG capsule Wait to take this until your doctor or other care provider tells you to start again. Commonly known as: PRILOSEC Take 40 mg by mouth 2 (two) times daily.       STOP taking these medications    amitriptyline  25 MG  tablet Commonly known as: ELAVIL    cefTAZidime  2 g in sodium chloride  0.9 % 100 mL       TAKE these medications    albuterol  108 (90 Base) MCG/ACT inhaler Commonly known as: VENTOLIN  HFA Inhale 2 puffs into the lungs every 6 (six) hours as needed for wheezing or shortness of breath.   amLODipine  5 MG tablet Commonly known as: NORVASC  Take 1 tablet (5 mg total) by mouth daily.   aspirin  EC 81 MG tablet Take 1 tablet (81 mg total) by mouth daily.   atorvastatin  10 MG tablet Commonly known as: LIPITOR Take 40 mg by mouth daily.   calcium -vitamin D  500-200 MG-UNIT tablet Commonly known as: OSCAL WITH D Take 1 tablet by mouth daily.   carvedilol  25 MG tablet Commonly known as: COREG  Take 12.5 mg by mouth 2 (two) times daily with a meal.   clopidogrel 75 MG tablet Commonly known as: PLAVIX Take 1 tablet (75 mg total) by mouth daily. Start taking on: August 06, 2023   doxycycline  100 MG tablet Commonly known as: VIBRA -TABS Take 100 mg by mouth 2 (two) times daily.   Durezol 0.05 % Emul Generic drug: Difluprednate Apply 1 drop to eye daily as needed.   gabapentin 100 MG capsule Commonly known as: NEURONTIN Take 100 mg by mouth 2 (two) times daily.   HEPARIN  LOCK FLUSH IV Inject 3 mLs into the vein daily.   moxifloxacin 0.5 % ophthalmic solution Commonly known as: VIGAMOX Place 1 drop into both eyes daily as needed.   multivitamin tablet Take 1 tablet by mouth daily.   Myfortic  180 MG EC tablet Generic drug: mycophenolate  Take 360 mg by mouth 2 (two) times daily. (brand name only)   polyethylene glycol 17 g packet Commonly known as: MIRALAX / GLYCOLAX Take 17 g by mouth daily as needed for mild constipation.   predniSONE  2.5 MG tablet Commonly known as: DELTASONE  Take 5 mg by mouth daily.   Prograf  1 MG capsule Generic drug: tacrolimus  Take 3 mg by mouth 2 (two) times daily.   silver sulfADIAZINE 1 % cream Commonly known as: SILVADENE Apply 1  application topically daily.       Allergies  Allergen Reactions   Cefepime Other (See Comments)    Altered mental status, aphasia   Percocet [Oxycodone -Acetaminophen ] Other (See Comments)    Confusion, dizziness    Follow-up Information     True, Daphene Dys, MD Follow up.   Specialty: Nephrology Why: 1-2 weeks Contact information: [address] [place] [address] [phone]         Arelia Been, NP Follow up.   Specialty: Neurology Contact information: [address] [place] [address] [phone]         Your [facility] nephrologist (kidney doctor) Follow up.                   The results of significant  diagnostics from this hospitalization (including imaging, microbiology, ancillary and laboratory) are listed below for reference.    Significant Diagnostic Studies: MR ANGIO HEAD WO CONTRAST Result Date: 08/04/2023 EXAM: MR Angiography Head without intravenous Contrast. 08/04/2023 02:38:54 PM TECHNIQUE: Magnetic resonance angiography images of the head without intravenous contrast. Three-dimensional MIP reformations performed. COMPARISON: None provided. CLINICAL HISTORY: Neuro deficit, acute, stroke suspected. 24 hrs post TNK; 86 y.o. Per EMS, patient was talking with a friend around 1 PM when she suddenly seemed to have some difficulty finding words with weakness on the right side of her body. Per EMS, patient had waxing and waning weakness in her right side during transport, did seem to consistently have difficulty speaking. She was also noted to have difficulty walking when EMS tried to stand her up to get her to the stretcher. FINDINGS: ANTERIOR CIRCULATION: No significant stenosis of the internal carotid arteries. No significant stenosis of the anterior cerebral arteries. No significant stenosis of the middle cerebral arteries. No aneurysm. Prominent posterior communicating arteries are present bilaterally, a normal variant. POSTERIOR CIRCULATION: No  significant stenosis of the posterior cerebral arteries. No significant stenosis of the basilar artery. No significant stenosis of the vertebral arteries. No aneurysm. IMPRESSION: 1. Normal mra of the head. Electronically signed by: Audree Leas MD 08/04/2023 05:38 PM EDT RP Workstation: [id number]   MR BRAIN WO CONTRAST Result Date: 08/04/2023 EXAM: MRI BRAIN WITHOUT CONTRAST 08/04/2023 02:38:54 PM TECHNIQUE: Multiplanar multisequence MRI of the head/brain was performed without the administration of intravenous contrast. COMPARISON: CT head without contrast 08/03/2023. CLINICAL HISTORY: Neuro deficit, acute, stroke suspected; s/p TNK for aphasia and right sided weakness, 24 hr scan ~1400 on 6/8. 24 hrs post TNK; 34 y.o. Per EMS, patient was talking with a friend around 1 PM when she suddenly seemed to have some difficulty finding words with weakness on the right side of her body. Per EMS, patient had waxing and waning weakness in her right side during transport, did seem to consistently have difficulty speaking. She was also noted to have difficulty walking when EMS tried to stand her up to get her to the stretcher. FINDINGS: BRAIN AND VENTRICLES: No acute infarct. No intracranial hemorrhage. No mass. No midline shift. No hydrocephalus. The sella is unremarkable. Normal flow voids. Mild generalized atrophy is present. Periventricular white matter T2 hyperintensities are moderately advanced for age. A remote lacunar infarct is present in the right paramedian pons. A remote lacunar infarct is present in the left cerebellar white matter. ORBITS: Bilateral lens replacements are noted. The globes and orbits are otherwise within normal limits. SINUSES AND MASTOIDS: No acute abnormality. BONES AND SOFT TISSUES: Normal marrow signal. No acute soft tissue abnormality. IMPRESSION: 1. No acute infarct or intracranial hemorrhage. 2. Mild generalized atrophy and moderately advanced periventricular white matter T2  hyperintensities for age. 3. Remote lacunar infarcts in the right paramedian pons and left cerebellar white matter. Electronically signed by: Audree Leas MD 08/04/2023 05:36 PM EDT RP Workstation: [id number]   US  Carotid Bilateral Result Date: 08/04/2023 CLINICAL DATA:  TIA symptoms, hypertension, diabetes EXAM: BILATERAL CAROTID DUPLEX ULTRASOUND TECHNIQUE: Martina Sledge scale imaging, color Doppler and duplex ultrasound were performed of bilateral carotid and vertebral arteries in the neck. COMPARISON:  None Available. FINDINGS: Criteria: Quantification of carotid stenosis is based on velocity parameters that correlate the residual internal carotid diameter with NASCET-based stenosis levels, using the diameter of the distal internal carotid lumen as the denominator for stenosis measurement. The following velocity measurements were obtained: RIGHT ICA: 85/22  cm/sec CCA: 58/13 cm/sec SYSTOLIC ICA/CCA RATIO:  1.5 ECA: 107 cm/sec LEFT ICA: 116/27 cm/sec CCA: 55/11 cm/sec SYSTOLIC ICA/CCA RATIO:  2.1 ECA: 92 cm/sec RIGHT CAROTID ARTERY: Minor intimal thickening and thin hypoechoic plaque formation. No hemodynamically significant right ICA stenosis, velocity elevation, or turbulent flow. Degree of narrowing less than 50%. RIGHT VERTEBRAL ARTERY:  Normal antegrade flow LEFT CAROTID ARTERY: Similar intimal thickening and bifurcation calcified plaque formation. No hemodynamically significant left ICA stenosis, velocity elevation, or turbulent flow. LEFT VERTEBRAL ARTERY:  Normal antegrade flow IMPRESSION: 1. Minor carotid atherosclerosis. Negative for significant stenosis. Degree of narrowing less than 50% bilaterally by ultrasound criteria. 2. Patent antegrade vertebral flow bilaterally. Electronically Signed   By: Melven Stable.  Shick M.D.   On: 08/04/2023 16:31   US  RENAL Result Date: 08/04/2023 CLINICAL DATA:  Renal failure, transplant. EXAM: RENAL / URINARY TRACT ULTRASOUND COMPLETE COMPARISON:  None Available. FINDINGS:  Native kidneys are small and echogenic measuring 6.7 cm on the left and 6.2 cm on the right. No renal parenchymal lesions or hydronephrosis. There are 2 transplanted kidneys in the left lower quadrant. One is unremarkable measuring 9.1 cm with no hydronephrosis or renal parenchymal abnormalities. The second transplanted kidney is small and echogenic. Urinary bladder was not imaged. IMPRESSION: 1. Small echogenic native kidneys. 2. Unremarkable transplanted kidney in the left lower quadrant. 3. Small echogenic transplanted kidney in the left lower quadrant. Electronically Signed   By: Sydell Eva M.D.   On: 08/04/2023 12:59   ECHOCARDIOGRAM COMPLETE Result Date: 08/04/2023    ECHOCARDIOGRAM REPORT   Patient Name:   Kendra Schroeder Date of Exam: 08/03/2023 Medical Rec #:  [id number]        Height:       62.0 in Accession #:    [id number]       Weight:       159.2 lb Date of Birth:  May 09, 1970         BSA:          1.735 m Patient Age:    52 years         BP:           165/84 mmHg Patient Gender: F                HR:           90 bpm. Exam Location:  [facility] Procedure: 2D Echo, Cardiac Doppler, Color Doppler and Saline Contrast Bubble            Study (Both Spectral and Color Flow Doppler were utilized during            procedure). Indications:     Stroke I63.9  History:         Patient has prior history of Echocardiogram examinations, most                  recent 12/02/2017.  Sonographer:     Clenton Czech RDCS, FASE Referring Phys:  [id number] Ygnacio Hemming BHAGAT Diagnosing Phys: Lida Reeks Alluri IMPRESSIONS  1. Left ventricular ejection fraction, by estimation, is 65 to 70%. The left ventricle has normal function. The left ventricle has no regional wall motion abnormalities. There is moderate concentric left ventricular hypertrophy. Left ventricular diastolic parameters are consistent with Grade I diastolic dysfunction (impaired relaxation).  2. Right ventricular systolic function is normal. The right ventricular size is  normal.  3. The mitral valve is normal in structure. No evidence of mitral valve regurgitation.  4. The  aortic valve is tricuspid. There is mild thickening of the aortic valve. Aortic valve regurgitation is not visualized.  5. The inferior vena cava is normal in size with greater than 50% respiratory variability, suggesting right atrial pressure of 3 mmHg.  6. Agitated saline contrast bubble study was negative, with no evidence of any interatrial shunt. FINDINGS  Left Ventricle: Left ventricular ejection fraction, by estimation, is 65 to 70%. The left ventricle has normal function. The left ventricle has no regional wall motion abnormalities. The left ventricular internal cavity size was normal in size. There is  moderate concentric left ventricular hypertrophy. Left ventricular diastolic parameters are consistent with Grade I diastolic dysfunction (impaired relaxation). Right Ventricle: The right ventricular size is normal. No increase in right ventricular wall thickness. Right ventricular systolic function is normal. Left Atrium: Left atrial size was normal in size. Right Atrium: Right atrial size was normal in size. Pericardium: There is no evidence of pericardial effusion. Mitral Valve: The mitral valve is normal in structure. No evidence of mitral valve regurgitation. Tricuspid Valve: The tricuspid valve is normal in structure. Tricuspid valve regurgitation is trivial. Aortic Valve: The aortic valve is tricuspid. There is mild thickening of the aortic valve. Aortic valve regurgitation is not visualized. Aortic valve peak gradient measures 6.5 mmHg. Pulmonic Valve: The pulmonic valve was not well visualized. Pulmonic valve regurgitation is not visualized. Aorta: The aortic root and ascending aorta are structurally normal, with no evidence of dilitation. Venous: The inferior vena cava is normal in size with greater than 50% respiratory variability, suggesting right atrial pressure of 3 mmHg. IAS/Shunts: The  atrial septum is grossly normal. Agitated saline contrast was given intravenously to evaluate for intracardiac shunting. Agitated saline contrast bubble study was negative, with no evidence of any interatrial shunt.  LEFT VENTRICLE PLAX 2D LVIDd:         2.60 cm   Diastology LVIDs:         1.80 cm   LV e' medial:    5.66 cm/s LV PW:         1.40 cm   LV E/e' medial:  14.5 LV IVS:        1.40 cm   LV e' lateral:   6.42 cm/s LVOT diam:     1.90 cm   LV E/e' lateral: 12.8 LV SV:         41 LV SV Index:   24 LVOT Area:     2.84 cm  RIGHT VENTRICLE RV Basal diam:  2.30 cm RV S prime:     11.30 cm/s TAPSE (M-mode): 1.9 cm LEFT ATRIUM           Index        RIGHT ATRIUM           Index LA diam:      2.80 cm 1.61 cm/m   RA Area:     12.30 cm LA Vol (A2C): 19.5 ml 11.24 ml/m  RA Volume:   28.60 ml  16.49 ml/m LA Vol (A4C): 20.0 ml 11.53 ml/m  AORTIC VALVE                 PULMONIC VALVE AV Area (Vmax): 1.83 cm     PV Vmax:        0.88 m/s AV Vmax:        127.50 cm/s  PV Peak grad:   3.1 mmHg AV Peak Grad:   6.5 mmHg     RVOT Peak grad: 2 mmHg  LVOT Vmax:      82.20 cm/s LVOT Vmean:     52.300 cm/s LVOT VTI:       0.146 m  AORTA Ao Root diam: 2.90 cm Ao Asc diam:  2.70 cm MITRAL VALVE MV Area (PHT): 5.42 cm    SHUNTS MV Decel Time: 140 msec    Systemic VTI:  0.15 m MV E velocity: 82.30 cm/s  Systemic Diam: 1.90 cm MV A velocity: 70.70 cm/s MV E/A ratio:  1.16 Joetta Mustache Electronically signed by Joetta Mustache Signature Date/Time: 08/04/2023/7:55:35 AM    Final    CT HEAD CODE STROKE WO CONTRAST Result Date: 08/03/2023 CLINICAL DATA:  Code stroke.  Neuro deficit, concern for stroke. EXAM: CT HEAD WITHOUT CONTRAST TECHNIQUE: Contiguous axial images were obtained from the base of the skull through the vertex without intravenous contrast. RADIATION DOSE REDUCTION: This exam was performed according to the departmental dose-optimization program which includes automated exposure control, adjustment of the mA and/or kV  according to patient size and/or use of iterative reconstruction technique. COMPARISON:  CT head 09/23/2018. FINDINGS: Brain: No acute intracranial hemorrhage. No CT evidence of acute infarct. Nonspecific hypoattenuation in the periventricular and subcortical white matter favored to reflect chronic microvascular ischemic changes. No edema, mass effect, or midline shift. The basilar cisterns are patent. Ventricles: The ventricles are normal. Vascular: Atherosclerotic calcifications of the carotid siphons and intracranial vertebral arteries. No hyperdense vessel. Skull: No acute or aggressive finding. Orbits: Orbits are symmetric. Sinuses: The visualized paranasal sinuses are clear. Other: Mastoid air cells are clear. ASPECTS [facility] Stroke Program Early CT Score) - Ganglionic level infarction (caudate, lentiform nuclei, internal capsule, insula, M1-M3 cortex): 7 - Supraganglionic infarction (M4-M6 cortex): 3 Total score (0-10 with 10 being normal): 10 IMPRESSION: 1. No CT evidence of acute intracranial abnormality. 2. Mild chronic microvascular ischemic changes. 3. ASPECTS is 10 These results were communicated to Dr. Cleone Dad at 1:51 pm on 08/03/2023 by text page via the [facility] messaging system. Electronically Signed   By: Denny Flack M.D.   On: 08/03/2023 13:51    Microbiology: Recent Results (from the past 240 hours)  MRSA Next Gen by PCR, Nasal     Status: None   Collection Time: 08/03/23  2:55 PM   Specimen: Nasal Mucosa; Nasal Swab  Result Value Ref Range Status   MRSA by PCR Next Gen NOT DETECTED NOT DETECTED Final    Comment: (NOTE) The GeneXpert MRSA Assay (FDA approved for NASAL specimens only), is one component of a comprehensive MRSA colonization surveillance program. It is not intended to diagnose MRSA infection nor to guide or monitor treatment for MRSA infections. Test performance is not FDA approved in patients less than 22 years old. Performed at [facility], [address] [place], [place] [address]      Labs: Basic Metabolic Panel: Recent Labs  Lab 08/03/23 1352 08/04/23 0728 08/05/23 0510  NA 138 137 137  K 3.7 4.5 4.3  CL 115* 116* 118*  CO2 16* 16* 15*  GLUCOSE 100* 106* 93  BUN 40* 36* 38*  CREATININE 2.52* 2.08* 2.36*  CALCIUM  8.5* 8.4* 8.4*   Liver Function Tests: Recent Labs  Lab 08/03/23 1352  AST 17  ALT 24  ALKPHOS 52  BILITOT 1.0  PROT 5.8*  ALBUMIN  3.1*   No results for input(s): "LIPASE", "AMYLASE" in the last 168 hours. No results for input(s): "AMMONIA" in the last 168 hours. CBC: Recent Labs  Lab 08/03/23 1352  WBC 8.1  NEUTROABS 4.4  HGB 9.9*  HCT 31.4*  MCV 92.6  PLT 155   Cardiac Enzymes: Recent Labs  Lab 08/03/23 1950  CKTOTAL 38   BNP: BNP (last 3 results) No results for input(s): "BNP" in the last 8760 hours.  ProBNP (last 3 results) No results for input(s): "PROBNP" in the last 8760 hours.  CBG: Recent Labs  Lab 08/03/23 1334 08/03/23 1458 08/03/23 2333 08/05/23 0731  GLUCAP 90 86 122* 81       Signed:  Raymonde Calico MD.  [facility] 08/05/2023, 1:01 PM

## 2023-08-05 NOTE — TOC Transition Note (Signed)
 Transition of Care [facility]) - Discharge Note   Patient Details  Name: Kendra Schroeder MRN: [id number] Date of Birth: 06/25/1970  Transition of Care [facility]) CM/SW Contact:  Crayton Docker, RN 08/05/2023, 3:04 PM   Clinical Narrative:     Discharge orders noted for home/self care. Patient discharged. Private transportation arranged. No identified needs.   Final next level of care: Home/Self Care Barriers to Discharge: No Barriers Identified   Patient Goals and CMS Choice    Home/self care  Discharge Placement     Home/self care            Discharge Plan and Services Additional resources added to the After Visit Summary for     Social Drivers of Health (SDOH) Interventions SDOH Screenings   Food Insecurity: Patient Declined (08/03/2023)  Housing: Patient Declined (08/03/2023)  Transportation Needs: Patient Declined (08/03/2023)  Utilities: Patient Declined (08/03/2023)  Financial Resource Strain: Low Risk  (03/23/2023)   Received from [facility]  Physical Activity: Insufficiently Active (10/14/2020)   Received from [facility], [facility]  Social Connections: Moderately Integrated (10/14/2020)   Received from [facility], [facility]  Stress: No Stress Concern Present (10/14/2020)   Received from [facility], [facility]  Tobacco Use: Low Risk  (07/23/2023)   Received from [facility] Literacy: Low Risk  (10/14/2020)   Received from [facility], [facility]     Readmission Risk Interventions     No data to display

## 2023-08-05 NOTE — Evaluation (Signed)
 Physical Therapy Evaluation Patient Details Name: Kendra Schroeder MRN: [id number] DOB: 04/16/1970 Today's Date: 08/05/2023  History of Present Illness  Patient is a 53 year old female with sudden onset right-sided flaccidity, ataxia and expressive aphasia s/p TNK. History of HTN, kidney transplant, CKD, DM1, PAD, pancreas transplant.  Clinical Impression  Patient is agreeable to PT evaluation. She reports she is independent without assistive device at baseline but uses a special shoe for chronic RLE issues with history of prior surgery. She drives and her mother lives with her.  Today the patient is Mod I for bed mobility. No focal weakness is noted in LE, but with chronic limited right ankle ROM. Patient ambulated into hallway without device with no loss of balance, likely chronic gait deficits noted. She reports feeling generalized weakness with mobility. Recommend PT follow up while in the hospital to maximize independence. Patient declined the need for PT follow up after this hospital stay.       If plan is discharge home, recommend the following: Assist for transportation;Help with stairs or ramp for entrance   Can travel by private vehicle        Equipment Recommendations None recommended by PT  Recommendations for Other Services       Functional Status Assessment Patient has had a recent decline in their functional status and demonstrates the ability to make significant improvements in function in a reasonable and predictable amount of time.     Precautions / Restrictions Precautions Precautions: Fall Recall of Precautions/Restrictions: Intact Restrictions Weight Bearing Restrictions Per Provider Order: No      Mobility  Bed Mobility Overal bed mobility: Modified Independent                  Transfers Overall transfer level: Needs assistance Equipment used: None Transfers: Sit to/from Stand Sit to Stand: Supervision           General transfer comment:  supervision for safety    Ambulation/Gait Ambulation/Gait assistance: Supervision Gait Distance (Feet): 100 Feet Assistive device: None Gait Pattern/deviations: Step-through pattern, Decreased step length - right, Decreased dorsiflexion - right Gait velocity: decreased     General Gait Details: patient reports feeling generalized weakness from being in the bed. she usually wears a special shoe for RLE which is not in the room. gait deficits as above that are likely chronic due to prior RLE surgery.  Stairs            Wheelchair Mobility     Tilt Bed    Modified Rankin (Stroke Patients Only) Modified Rankin (Stroke Patients Only) Pre-Morbid Rankin Score: No significant disability Modified Rankin: Slight disability     Balance Overall balance assessment: Needs assistance Sitting-balance support: Feet supported Sitting balance-Leahy Scale: Fair     Standing balance support: No upper extremity supported Standing balance-Leahy Scale: Fair                               Pertinent Vitals/Pain Pain Assessment Pain Assessment: No/denies pain    Home Living Family/patient expects to be discharged to:: Private residence Living Arrangements: Parent (mother) Available Help at Discharge: Family;Available PRN/intermittently Type of Home: House Home Access:  (1 step up)       Home Layout: One level        Prior Function Prior Level of Function : Independent/Modified Independent;Driving             Mobility Comments: independent without device.  has a special shoe for right foot (prior surgery) ADLs Comments: independent     Extremity/Trunk Assessment   Upper Extremity Assessment Upper Extremity Assessment: Defer to OT evaluation;Overall WFL for tasks assessed    Lower Extremity Assessment Lower Extremity Assessment: RLE deficits/detail;LLE deficits/detail RLE Deficits / Details: 5/5 hip flexion, hip add/abd, knee extension. foot is fixated per  patient report (unable to test DF and PF) RLE Sensation: WNL LLE Deficits / Details: 5/5 hip flexion, hip add/abd, knee extension, dorsiflexion, plantarflexion LLE Sensation: WNL       Communication   Communication Communication: No apparent difficulties    Cognition Arousal: Alert Behavior During Therapy: WFL for tasks assessed/performed   PT - Cognitive impairments: No apparent impairments                         Following commands: Intact       Cueing Cueing Techniques: Verbal cues     General Comments General comments (skin integrity, edema, etc.): vitals stable throughout session    Exercises     Assessment/Plan    PT Assessment Patient needs continued PT services  PT Problem List Decreased strength;Decreased activity tolerance;Decreased balance;Decreased mobility;Decreased range of motion       PT Treatment Interventions DME instruction;Gait training;Functional mobility training;Stair training;Therapeutic activities;Therapeutic exercise;Balance training;Neuromuscular re-education;Cognitive remediation;Patient/family education    PT Goals (Current goals can be found in the Care Plan section)  Acute Rehab PT Goals Patient Stated Goal: to return home PT Goal Formulation: With patient Time For Goal Achievement: 08/19/23 Potential to Achieve Goals: Good    Frequency Min 1X/week     Co-evaluation               AM-PAC PT "6 Clicks" Mobility  Outcome Measure Help needed turning from your back to your side while in a flat bed without using bedrails?: None Help needed moving from lying on your back to sitting on the side of a flat bed without using bedrails?: A Little Help needed moving to and from a bed to a chair (including a wheelchair)?: A Little Help needed standing up from a chair using your arms (e.g., wheelchair or bedside chair)?: A Little Help needed to walk in hospital room?: A Little Help needed climbing 3-5 steps with a railing? : A  Little 6 Click Score: 19    End of Session Equipment Utilized During Treatment: Gait belt Activity Tolerance: Patient tolerated treatment well Patient left: in bed;with call bell/phone within reach Nurse Communication: Mobility status PT Visit Diagnosis: Unsteadiness on feet (R26.81);Muscle weakness (generalized) (M62.81)    Time: 4696-2952 PT Time Calculation (min) (ACUTE ONLY): 18 min   Charges:   PT Evaluation $PT Eval Moderate Complexity: 1 Mod   PT General Charges $$ ACUTE PT VISIT: 1 Visit         Ozie Bo, PT, MPT   Erlene Hawks 08/05/2023, 10:04 AM

## 2023-08-15 ENCOUNTER — Encounter: Admit: 2023-08-15 | Discharge: 2023-08-15 | Payer: Medicare (Managed Care)

## 2023-08-15 DIAGNOSIS — Z124 Encounter for screening for malignant neoplasm of cervix: Principal | ICD-10-CM

## 2023-08-15 DIAGNOSIS — R87612 Low grade squamous intraepithelial lesion on cytologic smear of cervix (LGSIL): Principal | ICD-10-CM

## 2023-08-15 DIAGNOSIS — I10 Essential (primary) hypertension: Principal | ICD-10-CM

## 2023-08-15 DIAGNOSIS — I639 Cerebral infarction, unspecified: Principal | ICD-10-CM

## 2023-08-21 ENCOUNTER — Encounter: Admit: 2023-08-21 | Discharge: 2023-08-22 | Payer: Medicare (Managed Care) | Attending: Nephrology | Primary: Nephrology

## 2023-08-21 ENCOUNTER — Ambulatory Visit: Admit: 2023-08-21 | Discharge: 2023-08-22 | Payer: Medicare (Managed Care)

## 2023-08-22 DIAGNOSIS — I5189 Other ill-defined heart diseases: Principal | ICD-10-CM

## 2023-08-22 DIAGNOSIS — R87612 Low grade squamous intraepithelial lesion on cytologic smear of cervix (LGSIL): Principal | ICD-10-CM

## 2023-08-22 DIAGNOSIS — I517 Cardiomegaly: Principal | ICD-10-CM

## 2023-08-22 DIAGNOSIS — D849 Immunodeficiency, unspecified: Principal | ICD-10-CM

## 2023-08-23 ENCOUNTER — Emergency Department: Payer: Medicare (Managed Care)

## 2023-08-23 ENCOUNTER — Emergency Department
Admission: EM | Admit: 2023-08-23 | Discharge: 2023-08-23 | Disposition: A | Discharge status: Home / Self Care | Payer: Medicare (Managed Care) | Attending: Emergency Medicine | Admitting: Emergency Medicine

## 2023-08-23 ENCOUNTER — Other Ambulatory Visit: Payer: Self-pay

## 2023-08-23 ENCOUNTER — Encounter: Payer: Self-pay | Admitting: Emergency Medicine

## 2023-08-23 DIAGNOSIS — Z8673 Personal history of transient ischemic attack (TIA), and cerebral infarction without residual deficits: Secondary | ICD-10-CM | POA: Diagnosis not present

## 2023-08-23 DIAGNOSIS — R531 Weakness: Secondary | ICD-10-CM | POA: Diagnosis present

## 2023-08-23 DIAGNOSIS — R200 Anesthesia of skin: Secondary | ICD-10-CM | POA: Diagnosis not present

## 2023-08-23 DIAGNOSIS — I4719 Other supraventricular tachycardia: Secondary | ICD-10-CM | POA: Insufficient documentation

## 2023-08-23 DIAGNOSIS — I498 Other specified cardiac arrhythmias: Secondary | ICD-10-CM | POA: Insufficient documentation

## 2023-08-23 LAB — CBC WITH DIFFERENTIAL/PLATELET
Abs Immature Granulocytes: 0.05 10*3/uL (ref 0.00–0.07)
Basophils Absolute: 0 10*3/uL (ref 0.0–0.1)
Basophils Relative: 0 %
Eosinophils Absolute: 0.1 10*3/uL (ref 0.0–0.5)
Eosinophils Relative: 1 %
HCT: 29.5 % — ABNORMAL LOW (ref 36.0–46.0)
Hemoglobin: 9.2 g/dL — ABNORMAL LOW (ref 12.0–15.0)
Immature Granulocytes: 1 %
Lymphocytes Relative: 29 %
Lymphs Abs: 2.5 10*3/uL (ref 0.7–4.0)
MCH: 29.2 pg (ref 26.0–34.0)
MCHC: 31.2 g/dL (ref 30.0–36.0)
MCV: 93.7 fL (ref 80.0–100.0)
Monocytes Absolute: 0.8 10*3/uL (ref 0.1–1.0)
Monocytes Relative: 9 %
Neutro Abs: 5.2 10*3/uL (ref 1.7–7.7)
Neutrophils Relative %: 60 %
Platelets: 145 10*3/uL — ABNORMAL LOW (ref 150–400)
RBC: 3.15 MIL/uL — ABNORMAL LOW (ref 3.87–5.11)
RDW: 16.2 % — ABNORMAL HIGH (ref 11.5–15.5)
WBC: 8.7 10*3/uL (ref 4.0–10.5)
nRBC: 0.2 % (ref 0.0–0.2)

## 2023-08-23 LAB — TSH: TSH: 3.431 u[IU]/mL (ref 0.350–4.500)

## 2023-08-23 LAB — TROPONIN I (HIGH SENSITIVITY): Troponin I (High Sensitivity): 7 ng/L (ref <18)

## 2023-08-23 LAB — COMPREHENSIVE METABOLIC PANEL WITH GFR
ALT: 27 U/L (ref 0–44)
AST: 20 U/L (ref 15–41)
Albumin: 3.1 g/dL — ABNORMAL LOW (ref 3.5–5.0)
Alkaline Phosphatase: 46 U/L (ref 38–126)
Anion gap: 6 (ref 5–15)
BUN: 41 mg/dL — ABNORMAL HIGH (ref 6–20)
CO2: 15 mmol/L — ABNORMAL LOW (ref 22–32)
Calcium: 8.7 mg/dL — ABNORMAL LOW (ref 8.9–10.3)
Chloride: 118 mmol/L — ABNORMAL HIGH (ref 98–111)
Creatinine, Ser: 1.79 mg/dL — ABNORMAL HIGH (ref 0.44–1.00)
GFR, Estimated: 33 mL/min — ABNORMAL LOW (ref >60)
Glucose, Bld: 81 mg/dL (ref 70–99)
Potassium: 4.6 mmol/L (ref 3.5–5.1)
Sodium: 139 mmol/L (ref 135–145)
Total Bilirubin: 0.8 mg/dL (ref 0.0–1.2)
Total Protein: 5.4 g/dL — ABNORMAL LOW (ref 6.5–8.1)

## 2023-08-23 LAB — MAGNESIUM: Magnesium: 2.2 mg/dL (ref 1.7–2.4)

## 2023-08-23 MED ORDER — HEPARIN SOD (PORK) LOCK FLUSH 100 UNIT/ML IV SOLN
INTRAVENOUS | Status: AC
  Administered 2023-08-23: 500 [IU]
  Filled 2023-08-23: qty 5

## 2023-08-23 MED ORDER — LISINOPRIL 10 MG PO TABS
10.0000 mg | ORAL_TABLET | Freq: Once | ORAL | Status: AC
  Administered 2023-08-23: 10 mg via ORAL
  Filled 2023-08-23: qty 1

## 2023-08-23 MED ORDER — CARVEDILOL 6.25 MG PO TABS: 12.5000 mg | ORAL_TABLET | Freq: Two times a day (BID) | ORAL | Status: DC

## 2023-08-23 MED ORDER — SODIUM CHLORIDE 0.9 % IV BOLUS
1000.0000 mL | Freq: Once | INTRAVENOUS | Status: AC
  Administered 2023-08-23: 1000 mL via INTRAVENOUS

## 2023-08-23 MED ORDER — CARVEDILOL 6.25 MG PO TABS
12.5000 mg | ORAL_TABLET | Freq: Two times a day (BID) | ORAL | Status: DC
  Administered 2023-08-23: 12.5 mg via ORAL
  Filled 2023-08-23: qty 2

## 2023-08-23 NOTE — ED Triage Notes (Signed)
 Pt BIB [facility] with reports of tingling in her left hand and left side of her face that starteed at 0730. Pt reports that last about 5-10 minutes. Pt reports no tingling at this time. Pt reports she had a recent stroke on June 7th.

## 2023-08-23 NOTE — Assessment & Plan Note (Signed)
 Recommend continue home Coreg  as prescribed Follow-up with outpatient cardiology to review Zio patch rhythm that patient wore for 2 weeks Resume home cardiac medication as appropriate

## 2023-08-23 NOTE — Discharge Instructions (Addendum)
 You are seen in the emergency department following an episode of numbness and tingling to your left face and hand with your hand drawing up.  Your CT scan of your head did not show any bleeding.  You have an irregular heart rate with episodes of supraventricular tachycardia while in the emergency department.  We discussed the care with cardiology who did not recommend starting on a blood thinner at this time.  Recommended continuing on your Coreg  and lisinopril  and following up with your cardiologist.  Discussed with neurology who did not recommend an MRI of your head and had a low suspicion that you are having a seizure today, did recommend you to follow-up with neurology as an outpatient given your recent stroke.  Your kidney function has been abnormal, continue to follow-up with your nephrology team so they can work you up given your known kidney transplant.  Stay hydrated and drink plenty of fluids.  Return to the emergency department for any new symptoms.

## 2023-08-23 NOTE — ED Notes (Signed)
 Patient up ambulatory to toilet without incident and back to bed.

## 2023-08-23 NOTE — Consult Note (Signed)
 Initial Consultation Note   Patient: Kendra Schroeder FMW:[id number] DOB: [birth year] PCP: System, Provider Not In DOA: 08/23/2023 DOS: the patient was seen and examined on 08/23/2023 Primary service: No att. providers found  Referring physician: Dr. Suzanne Reason for consult: Atrial arrhythmia  HPI: [name] is a 53 year old female with history of renal transplant, pancreatic transplant, CKD stage IIIb, insulin -dependent diabetes mellitus, overweight, history of prior lacunar infarct, hypertension, PAD, presents ED for chief concerns of left-sided hand and facial tingling.  Vitals in the ED showed temperature of 99, respiration rate of 17, heart rate 93, blood pressure 130/99, SpO2 100% on room air.  Serum sodium is 139, potassium 4.6, chloride 118, bicarb 15, BUN 41, serum creatinine 1.79, EGFR 33, nonfasting glucose 81, WBC 8.7, hemoglobin 9.2, platelets of 145.  ED treatment: 1 L sodium chloride  bolus.  ED P discussed case with neurologist, Dr. Lindzen, who states patient is medically optimized on aspirin  and Plavix  and did not recommend MRI at this time.  Recommend outpatient neurology follow-up with [facility]  EDP discussed case initially with Dr. Darron, cardiology due to atrial dysrhythmia.  I was consulted for observation of patient for telemetry due to atrial dysrhythmia.  Upon further discussion with EDP, EDP confirmed with patient at bedside that patient did not take her home Coreg  12.5 mg p.o. twice daily nor her lisinopril .   patient has had a Zio patch for 2 weeks and it was sent off on Tuesday pending outpatient cardiology review and further recommendations.  Given patient with 2 weeks of Zio patch and did not take patient's Coreg , I recommend patient to resume home Coreg  and discharged home to follow-up with cardiologist outpatient regarding Zio patch for any dysrhythmia and appropriate treatment outpatient thereafter.  I discussed with Dr. Darron, who agrees.  I  discussed with EDP who agrees.  Assessment/Plan  Active Problems:   Atrial dysrhythmia   Assessment and Plan:  Atrial dysrhythmia Recommend continue home Coreg  as prescribed Follow-up with outpatient cardiology to review Zio patch rhythm that patient wore for 2 weeks Resume home cardiac medication as appropriate  Chart reviewed.   Hospitalization from 08/03/2023 to 08/05/2023: Patient presented for nonverbal, with right side completely flaccid.  Admitted for stroke, TNK was administered.  MRI showed no acute stroke and did show prior lacunar infarct.  Patient was started on aspirin , statin, Plavix .  Patient was discharged with Zio patch to be mailed to patient's home for wearing.  Patient to follow-up with outpatient [facility] neurology and cardiology as appropriate.  Disposition Plan: Recommend discharge from ED with outpatient follow-up with neurologist and cardiologist as appropriate  Past Medical History:  Diagnosis Date   CKD (chronic kidney disease), stage III (HCC)    Diabetes mellitus without complication (HCC)    GERD (gastroesophageal reflux disease)    Hypertension    Kidney transplant recipient    Myocardial infarct [facility])    Past Surgical History:  Procedure Laterality Date   CARPAL TUNNEL RELEASE Left    COMBINED KIDNEY-PANCREAS TRANSPLANT     EYE SURGERY     NEPHRECTOMY TRANSPLANTED ORGAN     TOE AMPUTATION     Social History:  reports that she has never smoked. She has never used smokeless tobacco. She reports that she does not drink alcohol and does not use drugs.  Allergies  Allergen Reactions   Cefepime Other (See Comments)    Altered mental status, aphasia   Percocet [Oxycodone -Acetaminophen ] Other (See Comments)    Confusion,  dizziness   Family History  Problem Relation Age of Onset   Diabetes Unknown    Cancer Unknown    Family history: Family history reviewed and not pertinent.  Prior to Admission medications   Medication Sig Start Date End Date  Taking? Authorizing Provider  albuterol  (PROVENTIL  HFA;VENTOLIN  HFA) 108 (90 BASE) MCG/ACT inhaler Inhale 2 puffs into the lungs every 6 (six) hours as needed for wheezing or shortness of breath. 01/26/15   Almeda Bernard, MD  amLODipine  (NORVASC ) 5 MG tablet Take 1 tablet (5 mg total) by mouth daily. 08/05/23   Wouk, Devaughn Sayres, MD  aspirin  EC 81 MG tablet Take 1 tablet (81 mg total) by mouth daily. 08/05/23   Wouk, Devaughn Sayres, MD  atorvastatin  (LIPITOR) 10 MG tablet Take 40 mg by mouth daily.    [provider]  calcium -vitamin D  (OSCAL WITH D) 500-200 MG-UNIT tablet Take 1 tablet by mouth daily.    [provider]  carvedilol  (COREG ) 25 MG tablet Take 12.5 mg by mouth 2 (two) times daily with a meal.    [provider]  clopidogrel  (PLAVIX ) 75 MG tablet Take 1 tablet (75 mg total) by mouth daily. 08/06/23   Wouk, Devaughn Sayres, MD  Difluprednate (DUREZOL) 0.05 % EMUL Apply 1 drop to eye daily as needed.    [provider]  doxycycline  (VIBRA -TABS) 100 MG tablet Take 100 mg by mouth 2 (two) times daily. 11/26/17   [provider]  furosemide  (LASIX ) 20 MG tablet Take 20 mg by mouth daily.    [provider]  gabapentin (NEURONTIN) 100 MG capsule Take 100 mg by mouth 2 (two) times daily.    [provider]  HEPARIN  LOCK FLUSH IV Inject 3 mLs into the vein daily. Patient not taking: Reported on 02/05/2022    [provider]  lisinopril  (ZESTRIL ) 10 MG tablet Take 10 mg by mouth daily.    [provider]  moxifloxacin (VIGAMOX) 0.5 % ophthalmic solution Place 1 drop into both eyes daily as needed.    [provider]  Multiple Vitamin (MULTIVITAMIN) tablet Take 1 tablet by mouth daily.    [provider]  MYFORTIC  180 MG EC tablet Take 360 mg by mouth 2 (two) times daily. (brand name only)    [provider]  omeprazole (PRILOSEC) 40 MG capsule Take 40 mg by mouth 2 (two) times daily.     [provider]  polyethylene glycol (MIRALAX  / GLYCOLAX ) packet Take 17 g by mouth daily as needed for mild constipation.    [provider]  predniSONE  (DELTASONE ) 2.5 MG tablet Take 5 mg by mouth daily.    [provider]  PROGRAF  1 MG capsule Take 3 mg by mouth 2 (two) times daily.    [provider]  silver sulfADIAZINE (SILVADENE) 1 % cream Apply 1 application topically daily. Patient not taking: Reported on 08/03/2023    [provider]   Physical Exam: Vitals:   08/23/23 1128 08/23/23 1130 08/23/23 1200 08/23/23 1230  BP:  126/87 (!) 143/87 134/84  Pulse:  (!) 113 86 88  Resp:  (!) 22 (!) 24 (!) 23  Temp:      SpO2:  97% 100% 100%  Weight: 73.5 kg     Height: 5' 2 (1.575 m)      EKG: independently reviewed, showing atrial rhythm with rate of 89, irregular, QTc 452.  Prior EKG showed sinus tachycardia with rate of 126, QTc 484  Chest x-ray  on Admission: Not indicated at this time  CT Head Wo Contrast Result Date: 08/23/2023 CLINICAL DATA:  Transient ischemic attack (TIA). Left-sided hand and facial tingling, now resolved. EXAM: CT HEAD WITHOUT CONTRAST TECHNIQUE: Contiguous axial images were obtained from the base of the skull through the vertex without intravenous contrast. RADIATION DOSE REDUCTION: This exam was performed according to the departmental dose-optimization program which includes automated exposure control, adjustment of the mA and/or kV according to patient size and/or use of iterative reconstruction technique. COMPARISON:  Head CT 08/03/2023 and MRI 08/04/2023 FINDINGS: Brain: There is no evidence of an acute infarct, intracranial hemorrhage, mass, midline shift, or extra-axial fluid collection. Cerebral volume is normal. The ventricles are normal in size. Cerebral white matter hypodensities are unchanged and nonspecific but compatible with mild-to-moderate chronic small vessel ischemic disease. Vascular: Calcified  atherosclerosis at the skull base. No hyperdense vessel. Skull: No fracture or suspicious lesion. Sinuses/Orbits: Visualized paranasal sinuses and mastoid air cells are clear. Bilateral cataract extraction. Other: None. IMPRESSION: 1. No evidence of acute intracranial abnormality. 2. Age advanced chronic small vessel ischemic disease. Electronically Signed   By: Dasie Hamburg M.D.   On: 08/23/2023 10:35   Labs on Admission: I have personally reviewed following labs  CBC: Recent Labs  Lab 08/23/23 0903  WBC 8.7  NEUTROABS 5.2  HGB 9.2*  HCT 29.5*  MCV 93.7  PLT 145*   Basic Metabolic Panel: Recent Labs  Lab 08/23/23 0903  NA 139  K 4.6  CL 118*  CO2 15*  GLUCOSE 81  BUN 41*  CREATININE 1.79*  CALCIUM  8.7*  MG 2.2   GFR: Estimated Creatinine Clearance: 34.1 mL/min (A) (by C-G formula based on SCr of 1.79 mg/dL (H)).  Liver Function Tests: Recent Labs  Lab 08/23/23 0903  AST 20  ALT 27  ALKPHOS 46  BILITOT 0.8  PROT 5.4*  ALBUMIN  3.1*   Thyroid Function Tests: Recent Labs    08/23/23 0903  TSH 3.431   Urine analysis:    Component Value Date/Time   COLORURINE AMBER (A) 08/25/2016 1132   APPEARANCEUR TURBID (A) 08/25/2016 1132   LABSPEC 1.024 08/25/2016 1132   PHURINE 5.0 08/25/2016 1132   GLUCOSEU NEGATIVE 08/25/2016 1132   HGBUR NEGATIVE 08/25/2016 1132   BILIRUBINUR NEGATIVE 08/25/2016 1132   KETONESUR 5 (A) 08/25/2016 1132   PROTEINUR 100 (A) 08/25/2016 1132   NITRITE NEGATIVE 08/25/2016 1132   LEUKOCYTESUR MODERATE (A) 08/25/2016 1132   This document was prepared using Dragon Voice Recognition software and may include unintentional dictation errors.  Dr. Sherre [facility]  If 7PM-7AM, please contact overnight-coverage provider If 7AM-7PM, please contact day attending provider [email or web address]  08/23/2023, 5:47 PM

## 2023-08-23 NOTE — ED Provider Notes (Addendum)
 [facility] Provider Note    Event Date/Time   First MD Initiated Contact with Patient 08/23/23 [phone]     (approximate)   History   Neurologic Problem   HPI  Kendra Schroeder is a 53 y.o. female past medical history significant for recent CVA with right sided flaccid, expressive aphasia and ataxia, history of renal transplant, presents to the emergency department following an episode of weakness and tingling to her left side.  States that she was up this morning in her normal state of health.  Was washing close and had a sudden onset of feeling like she had weakness and her left arm was drying up and felt like she was having tingling sensation to her left arm and leg.  This lasted for approximately 5 to 10 minutes and then resolved.  States that she is now back to her normal mental status.  Does not have any deficits from her prior CVA.  Denies any falls or trauma.  Denies prior history of seizure.  Denies nausea, vomiting, chest pain or shortness of breath.  Denies dysuria, urinary urgency or frequency.  No new medicines.   On chart review patient had a recent hospitalization was on discharged on 08/05/2023 following an acute CVA.  Patient received TN K and symptoms resolved.  Patient otherwise had a workup that was unremarkable other than an MRI noting acute infarct.  Did not have any PT needs.  With started on Plavix .    Physical Exam   Triage Vital Signs: ED Triage Vitals [08/23/23 0754]  Encounter Vitals Group     BP (!) 130/99     Girls Systolic BP Percentile      Girls Diastolic BP Percentile      Boys Systolic BP Percentile      Boys Diastolic BP Percentile      Pulse Rate 93     Resp 17     Temp 99 F (37.2 C)     Temp src      SpO2 100 %     Weight      Height      Head Circumference      Peak Flow      Pain Score 0     Pain Loc      Pain Education      Exclude from Growth Chart     Most recent vital signs: Vitals:   08/23/23 1130  08/23/23 1200  BP: 126/87 (!) 143/87  Pulse: (!) 113 86  Resp: (!) 22 (!) 24  Temp:    SpO2: 97% 100%    Physical Exam Constitutional:      Appearance: She is well-developed.  HENT:     Head: Atraumatic.   Eyes:     Conjunctiva/sclera: Conjunctivae normal.    Cardiovascular:     Rate and Rhythm: Tachycardia present. Rhythm irregular.  Pulmonary:     Effort: No respiratory distress.  Abdominal:     General: There is no distension.   Musculoskeletal:        General: Normal range of motion.     Cervical back: Normal range of motion.   Skin:    General: Skin is warm.   Neurological:     Mental Status: She is alert. Mental status is at baseline.     GCS: GCS eye subscore is 4. GCS verbal subscore is 5. GCS motor subscore is 6.     Cranial Nerves: Cranial nerves 2-12 are intact.  Sensory: Sensation is intact.     Motor: Motor function is intact.     Coordination: Coordination is intact.     IMPRESSION / MDM / ASSESSMENT AND PLAN / ED COURSE  I reviewed the triage vital signs and the nursing notes.  Differential diagnosis including TIA/CVA, intracranial hemorrhage, focal seizure, electrolyte abnormality, dehydration  EKG  I, Clotilda Punter, the attending physician, personally viewed and interpreted this ECG.  EKG reading as sinus tachycardia with underlying right bundle branch block.  On my evaluation she does appear to have P waves.  Appears to have an irregular rhythm.  Does have a history of underlying right bundle branch block.  Repeat EKG continues to have atrial tachycardia that appears irregular but does have obvious P waves.  No obvious flutter waves.  Concern for an atrial tachycardia/dysrhythmia  Irregular rhythm dysrhythmias while on cardiac telemetry.  RADIOLOGY I independently reviewed imaging, my interpretation of imaging: CT scan of the head -no signs of intracranial hemorrhage  LABS (all labs ordered are listed, but only abnormal results are  displayed) Labs interpreted as -    Labs Reviewed  CBC WITH DIFFERENTIAL/PLATELET - Abnormal; Notable for the following components:      Result Value   RBC 3.15 (*)    Hemoglobin 9.2 (*)    HCT 29.5 (*)    RDW 16.2 (*)    Platelets 145 (*)    All other components within normal limits  COMPREHENSIVE METABOLIC PANEL WITH GFR - Abnormal; Notable for the following components:   Chloride 118 (*)    CO2 15 (*)    BUN 41 (*)    Creatinine, Ser 1.79 (*)    Calcium  8.7 (*)    Total Protein 5.4 (*)    Albumin  3.1 (*)    GFR, Estimated 33 (*)    All other components within normal limits  MAGNESIUM  TSH  TROPONIN I (HIGH SENSITIVITY)     MDM Lab work overall unremarkable.  Patient has acute kidney injury with her known renal transplant and is followed by her nephrology team.  States that her transplant team is aware and they are working her up.  Clinical picture today concerning for possible TIA/CVA.  No obvious electrolyte abnormality.  Discussed the patient's case with neurology,.  Dr. Lindzen, who felt that she was medically optimized on aspirin  and Plavix .  Did not recommend MRI repeated at this time.  Recommended outpatient neurology follow-up with the patient is scheduling with [facility].  Also recommended a Holter monitor for outpatient follow-up for possible atrial tachycardia.  Patient is already worn a Zio patch for 2 weeks and sent it off on Tuesday.  Multiple EKGs patient had atrial dysrhythmia on cardiac telemetry.  Discussed the patient's case with cardiology Dr. Darron, recommended admission for observation, recommended holding on anticoagulation with heparin  at this time.  Clinical Course as of 08/23/23 1244  Fri Aug 23, 2023  1229 Patient did not take her home Coreg  or lisinopril , given this morning.  On my reevaluation patient's heart rate was 88.  Discussed with the hospitalist Dr. Sherre who will again reach out with to Dr. Darron [SM]  1237 After talking with the hospitalist given  home Coreg  and lisinopril .  Again talked with Dr.  Darron who recommended discharging home with close follow-up with her cardiologist given that she just wore a Zio patch.  On reevaluation heart rate is in the 80s.  Patient feels safe with going home and following up closely with primary  care provider, cardiologist and her nephrologist.  Patient will also follow-up with neurology as an outpatient.  Given return precautions. [SM]    Clinical Course User Index [SM] Suzanne Kirsch, MD      PROCEDURES:  Critical Care performed: yes  .Critical Care  Performed by: Suzanne Kirsch, MD Authorized by: Suzanne Kirsch, MD   Critical care provider statement:    Critical care time (minutes):  30   Critical care time was exclusive of:  Separately billable procedures and treating other patients   Critical care was necessary to treat or prevent imminent or life-threatening deterioration of the following conditions:  Circulatory failure   Critical care was time spent personally by me on the following activities:  Development of treatment plan with patient or surrogate, discussions with consultants, evaluation of patient's response to treatment, examination of patient, ordering and review of laboratory studies, ordering and review of radiographic studies, ordering and performing treatments and interventions, pulse oximetry, re-evaluation of patient's condition and review of old charts   Patient's presentation is most consistent with acute presentation with potential threat to life or bodily function.   MEDICATIONS ORDERED IN ED: Medications  carvedilol  (COREG ) tablet 12.5 mg (has no administration in time range)  sodium chloride  0.9 % bolus 1,000 mL (0 mLs Intravenous Stopped 08/23/23 1154)  lisinopril  (ZESTRIL ) tablet 10 mg (10 mg Oral Given 08/23/23 1240)    FINAL CLINICAL IMPRESSION(S) / ED DIAGNOSES   Final diagnoses:  Atrial tachycardia (HCC)  Left sided numbness     Rx / DC Orders   ED  Discharge Orders     None        Note:  This document was prepared using Dragon voice recognition software and may include unintentional dictation errors.   Suzanne Kirsch, MD 08/23/23 1201    Suzanne Kirsch, MD 08/23/23 1238    Suzanne Kirsch, MD 08/23/23 1244

## 2023-08-23 NOTE — Hospital Course (Signed)
 [name] is a 53 year old female with history of renal transplant, pancreatic transplant, CKD stage IIIb, insulin -dependent diabetes mellitus, overweight, history of prior lacunar infarct, hypertension, PAD, presents ED for chief concerns of left-sided hand and facial tingling.  Vitals in the ED showed temperature of 99, respiration rate of 17, heart rate 93, blood pressure 130/99, SpO2 100% on room air.  Serum sodium is 139, potassium 4.6, chloride 118, bicarb 15, BUN 41, serum creatinine 1.79, EGFR 33, nonfasting glucose 81, WBC 8.7, hemoglobin 9.2, platelets of 145.  ED treatment: 1 L sodium chloride  bolus.  ED P discussed case with neurologist, Dr. Lindzen, who states patient is medically optimized on aspirin  and Plavix  and did not recommend MRI at this time.  Recommend outpatient neurology follow-up with [facility].  EDP discussed case initially with Dr. Darron, cardiology due to atrial dysrhythmia.  I was consulted for observation of patient for telemetry due to atrial dysrhythmia.  Upon further discussion with EDP, EDP confirmed with patient at bedside that patient did not take her home Coreg  12.5 mg p.o. twice daily nor her lisinopril .   patient has had a Zio patch for 2 weeks and it was sent off on Tuesday pending outpatient cardiology review and further recommendations.  Given patient with 2 weeks of Zio patch and did not take patient's Coreg , I recommend patient to resume home Coreg  and discharged home to follow-up with cardiologist outpatient regarding Zio patch for any dysrhythmia and appropriate treatment outpatient thereafter.  I discussed with Dr. Darron, who agrees.  I discussed with EDP who agrees.

## 2023-08-23 NOTE — ED Notes (Signed)
 Port flushed with heparin  flush

## 2023-08-23 NOTE — ED Notes (Signed)
 ED Provider at bedside.

## 2023-08-26 ENCOUNTER — Ambulatory Visit: Payer: Self-pay | Admitting: Physician Assistant

## 2023-08-26 DIAGNOSIS — I639 Cerebral infarction, unspecified: Secondary | ICD-10-CM

## 2023-08-27 ENCOUNTER — Other Ambulatory Visit: Payer: Self-pay | Admitting: *Deleted

## 2023-08-27 DIAGNOSIS — I498 Other specified cardiac arrhythmias: Secondary | ICD-10-CM

## 2023-08-27 NOTE — Progress Notes (Signed)
 Spoke with Pt regarding her heart monitor results. Explained to her that it was recommended that she have a consult with EP due to her dysrhythmia.  Pt states that she had another episode last weekend with the same feeling like she did when she had her stroke that brought her to [facility]. She was seen at [facility] and they have set her up with a cardiology appointment for 7/7.  Pt verbalized understanding that she needed to follow up due to results of heart monitor. She  requested our phone number in the event that she needs our services in the future.

## 2023-09-04 ENCOUNTER — Ambulatory Visit: Admit: 2023-09-04 | Discharge: 2023-09-05 | Payer: Medicare (Managed Care)

## 2023-09-04 MED ORDER — ATORVASTATIN 80 MG TABLET
ORAL_TABLET | Freq: Every day | ORAL | 11 refills | 30.00000 days | Status: CP
Start: 2023-09-04 — End: 2024-09-03

## 2023-09-05 MED FILL — MYCOPHENOLATE SODIUM 180 MG TABLET,DELAYED RELEASE: ORAL | 90 days supply | Qty: 360 | Fill #1

## 2023-09-05 MED FILL — TACROLIMUS 1 MG CAPSULE, IMMEDIATE-RELEASE: ORAL | 30 days supply | Qty: 180 | Fill #1

## 2023-09-06 DIAGNOSIS — R0609 Other forms of dyspnea: Principal | ICD-10-CM

## 2023-09-06 DIAGNOSIS — I10 Essential (primary) hypertension: Principal | ICD-10-CM

## 2023-09-06 DIAGNOSIS — I471 SVT (supraventricular tachycardia) (HHS-HCC): Principal | ICD-10-CM

## 2023-09-06 MED ORDER — LISINOPRIL 5 MG TABLET
ORAL_TABLET | Freq: Every day | ORAL | 3 refills | 100.00000 days | Status: CP
Start: 2023-09-06 — End: ?

## 2023-09-13 ENCOUNTER — Ambulatory Visit
Admit: 2023-09-13 | Discharge: 2023-09-14 | Payer: Medicare (Managed Care) | Attending: Rehabilitative and Restorative Service Providers" | Primary: Rehabilitative and Restorative Service Providers"

## 2023-09-19 ENCOUNTER — Encounter
Admit: 2023-09-19 | Discharge: 2023-09-19 | Payer: Medicare (Managed Care) | Attending: Internal Medicine | Primary: Internal Medicine

## 2023-09-19 MED ORDER — CIPROFLOXACIN 250 MG TABLET
ORAL_TABLET | Freq: Two times a day (BID) | ORAL | 0 refills | 3.00000 days | Status: CP
Start: 2023-09-19 — End: 2023-09-22

## 2023-09-23 DIAGNOSIS — N3000 Acute cystitis without hematuria: Principal | ICD-10-CM

## 2023-09-23 MED ORDER — NITROFURANTOIN MONOHYDRATE/MACROCRYSTALS 100 MG CAPSULE
ORAL_CAPSULE | Freq: Two times a day (BID) | ORAL | 0 refills | 5.00000 days | Status: CP
Start: 2023-09-23 — End: 2023-09-28

## 2023-09-26 ENCOUNTER — Ambulatory Visit
Admit: 2023-09-26 | Discharge: 2023-09-27 | Payer: Medicare (Managed Care) | Attending: Internal Medicine | Primary: Internal Medicine

## 2023-09-26 DIAGNOSIS — E1042 Type 1 diabetes mellitus with diabetic polyneuropathy: Principal | ICD-10-CM

## 2023-09-26 MED FILL — TACROLIMUS 1 MG CAPSULE, IMMEDIATE-RELEASE: ORAL | 30 days supply | Qty: 180 | Fill #2

## 2023-09-27 DIAGNOSIS — Z940 Kidney transplant status: Principal | ICD-10-CM

## 2023-09-27 DIAGNOSIS — Z9483 Pancreas transplant status: Principal | ICD-10-CM

## 2023-09-27 DIAGNOSIS — E559 Vitamin D deficiency, unspecified: Principal | ICD-10-CM

## 2023-09-27 DIAGNOSIS — Z79899 Other long term (current) drug therapy: Principal | ICD-10-CM

## 2023-10-01 ENCOUNTER — Ambulatory Visit: Admit: 2023-10-01 | Discharge: 2023-10-02 | Payer: Medicare (Managed Care)

## 2023-10-01 ENCOUNTER — Encounter: Admit: 2023-10-01 | Discharge: 2023-10-02 | Payer: Medicare (Managed Care) | Attending: Nephrology | Primary: Nephrology

## 2023-10-01 DIAGNOSIS — Z940 Kidney transplant status: Principal | ICD-10-CM

## 2023-10-01 DIAGNOSIS — E559 Vitamin D deficiency, unspecified: Principal | ICD-10-CM

## 2023-10-01 DIAGNOSIS — Z9483 Pancreas transplant status: Principal | ICD-10-CM

## 2023-10-01 DIAGNOSIS — Z79899 Other long term (current) drug therapy: Principal | ICD-10-CM

## 2023-10-02 ENCOUNTER — Ambulatory Visit: Admit: 2023-10-02 | Discharge: 2023-10-03 | Payer: Medicare (Managed Care)

## 2023-10-02 DIAGNOSIS — Z940 Kidney transplant status: Principal | ICD-10-CM

## 2023-10-02 DIAGNOSIS — I471 SVT (supraventricular tachycardia) (HHS-HCC): Principal | ICD-10-CM

## 2023-10-02 DIAGNOSIS — N1831 Anemia in stage 3a chronic kidney disease (CMS-HCC): Principal | ICD-10-CM

## 2023-10-02 DIAGNOSIS — Z79899 Other long term (current) drug therapy: Principal | ICD-10-CM

## 2023-10-02 DIAGNOSIS — D631 Anemia in chronic kidney disease: Principal | ICD-10-CM

## 2023-10-04 ENCOUNTER — Inpatient Hospital Stay: Admit: 2023-10-04 | Discharge: 2023-10-04 | Payer: Medicare (Managed Care)

## 2023-10-04 ENCOUNTER — Inpatient Hospital Stay: Admit: 2023-10-04 | Discharge: 2023-10-05 | Payer: Medicare (Managed Care)

## 2023-10-04 DIAGNOSIS — R0609 Other forms of dyspnea: Principal | ICD-10-CM

## 2023-10-07 MED ORDER — ERGOCALCIFEROL (VITAMIN D2) 1,250 MCG (50,000 UNIT) CAPSULE
ORAL_CAPSULE | ORAL | 0 refills | 84.00000 days | Status: CP
Start: 2023-10-07 — End: 2024-10-06

## 2023-10-09 DIAGNOSIS — R9439 Abnormal result of other cardiovascular function study: Principal | ICD-10-CM

## 2023-10-09 DIAGNOSIS — R0609 Other forms of dyspnea: Principal | ICD-10-CM

## 2023-10-14 ENCOUNTER — Ambulatory Visit
Admit: 2023-10-14 | Discharge: 2023-10-14 | Payer: Medicare (Managed Care) | Attending: Vascular Surgery | Primary: Vascular Surgery

## 2023-10-14 ENCOUNTER — Inpatient Hospital Stay: Admit: 2023-10-14 | Discharge: 2023-10-14 | Payer: Medicare (Managed Care)

## 2023-10-14 DIAGNOSIS — L97519 Non-pressure chronic ulcer of other part of right foot with unspecified severity: Principal | ICD-10-CM

## 2023-10-14 DIAGNOSIS — I739 Peripheral vascular disease, unspecified: Principal | ICD-10-CM

## 2023-10-16 ENCOUNTER — Encounter
Admit: 2023-10-16 | Discharge: 2023-10-16 | Payer: Medicare (Managed Care) | Attending: Obstetrics & Gynecology | Primary: Obstetrics & Gynecology

## 2023-10-18 ENCOUNTER — Inpatient Hospital Stay: Admit: 2023-10-18 | Discharge: 2023-10-18 | Payer: Medicare (Managed Care)

## 2023-10-18 DIAGNOSIS — N1832 Stage 3b chronic kidney disease (CMS-HCC): Principal | ICD-10-CM

## 2023-10-18 MED ORDER — ALBUTEROL SULFATE HFA 90 MCG/ACTUATION AEROSOL INHALER
RESPIRATORY_TRACT | 1 refills | 0.00000 days | Status: CP | PRN
Start: 2023-10-18 — End: 2024-10-17

## 2023-10-18 MED ORDER — CLOPIDOGREL 75 MG TABLET
ORAL_TABLET | Freq: Every day | ORAL | 3 refills | 90.00000 days | Status: CP
Start: 2023-10-18 — End: 2023-10-18

## 2023-10-18 MED ORDER — CARVEDILOL 12.5 MG TABLET
ORAL_TABLET | Freq: Two times a day (BID) | ORAL | 0 refills | 30.00000 days | Status: CP
Start: 2023-10-18 — End: 2023-11-17

## 2023-10-21 MED FILL — TACROLIMUS 1 MG CAPSULE, IMMEDIATE-RELEASE: ORAL | 30 days supply | Qty: 180 | Fill #3

## 2023-10-22 ENCOUNTER — Ambulatory Visit
Admit: 2023-10-22 | Discharge: 2023-10-23 | Payer: Medicare (Managed Care) | Attending: Orthopaedic Surgery | Primary: Orthopaedic Surgery

## 2023-10-25 ENCOUNTER — Ambulatory Visit: Admit: 2023-10-25 | Discharge: 2023-10-26 | Payer: Medicare (Managed Care)

## 2023-10-25 MED ORDER — OZEMPIC 2 MG/DOSE (8 MG/3 ML) SUBCUTANEOUS PEN INJECTOR
SUBCUTANEOUS | 2 refills | 0.00000 days
Start: 2023-10-25 — End: ?

## 2023-10-30 MED ORDER — OZEMPIC 2 MG/DOSE (8 MG/3 ML) SUBCUTANEOUS PEN INJECTOR
SUBCUTANEOUS | 2 refills | 28.00000 days | Status: CP
Start: 2023-10-30 — End: ?

## 2023-10-31 DIAGNOSIS — R58 Hemorrhage, not elsewhere classified: Principal | ICD-10-CM

## 2023-10-31 DIAGNOSIS — Z23 Encounter for immunization: Principal | ICD-10-CM

## 2023-10-31 DIAGNOSIS — Z124 Encounter for screening for malignant neoplasm of cervix: Principal | ICD-10-CM

## 2023-10-31 DIAGNOSIS — E1042 Type 1 diabetes mellitus with diabetic polyneuropathy: Principal | ICD-10-CM

## 2023-10-31 DIAGNOSIS — Z0000 Encounter for general adult medical examination without abnormal findings: Principal | ICD-10-CM

## 2023-10-31 DIAGNOSIS — R0609 Other forms of dyspnea: Principal | ICD-10-CM

## 2023-10-31 DIAGNOSIS — M549 Dorsalgia, unspecified: Principal | ICD-10-CM

## 2023-10-31 MED ORDER — BLOOD GLUCOSE TEST STRIPS
ORAL_STRIP | Freq: Every day | 3 refills | 0.00000 days | Status: CP
Start: 2023-10-31 — End: 2024-10-30

## 2023-10-31 MED ORDER — CYCLOBENZAPRINE 5 MG TABLET
ORAL_TABLET | Freq: Every evening | ORAL | 0 refills | 30.00000 days | Status: CP | PRN
Start: 2023-10-31 — End: ?

## 2023-10-31 MED ORDER — ALBUTEROL SULFATE HFA 90 MCG/ACTUATION AEROSOL INHALER
RESPIRATORY_TRACT | 1 refills | 0.00000 days | Status: CP | PRN
Start: 2023-10-31 — End: 2024-10-30

## 2023-11-11 DIAGNOSIS — Z79899 Other long term (current) drug therapy: Principal | ICD-10-CM

## 2023-11-11 DIAGNOSIS — E559 Vitamin D deficiency, unspecified: Principal | ICD-10-CM

## 2023-11-11 DIAGNOSIS — Z940 Kidney transplant status: Principal | ICD-10-CM

## 2023-11-13 ENCOUNTER — Ambulatory Visit
Admit: 2023-11-13 | Discharge: 2023-11-14 | Payer: Medicare (Managed Care) | Attending: Internal Medicine | Primary: Internal Medicine

## 2023-11-13 ENCOUNTER — Ambulatory Visit: Admit: 2023-11-13 | Discharge: 2023-11-14 | Payer: Medicare (Managed Care) | Attending: Nephrology | Primary: Nephrology

## 2023-11-13 ENCOUNTER — Ambulatory Visit
Admit: 2023-11-13 | Discharge: 2023-11-14 | Payer: Medicare (Managed Care) | Attending: Student in an Organized Health Care Education/Training Program | Primary: Student in an Organized Health Care Education/Training Program

## 2023-11-13 DIAGNOSIS — Z940 Kidney transplant status: Principal | ICD-10-CM

## 2023-11-13 DIAGNOSIS — Z79899 Other long term (current) drug therapy: Principal | ICD-10-CM

## 2023-11-13 DIAGNOSIS — E559 Vitamin D deficiency, unspecified: Principal | ICD-10-CM

## 2023-11-13 DIAGNOSIS — Z9483 Pancreas transplant status: Principal | ICD-10-CM

## 2023-11-13 MED ORDER — TACROLIMUS 1 MG CAPSULE, IMMEDIATE-RELEASE
ORAL_CAPSULE | ORAL | 11 refills | 30.00000 days | Status: CP
Start: 2023-11-13 — End: 2024-02-11
  Filled 2023-12-02: qty 150, 30d supply, fill #0

## 2023-11-13 MED ORDER — CARVEDILOL 6.25 MG TABLET
ORAL_TABLET | Freq: Two times a day (BID) | ORAL | 3 refills | 90.00000 days | Status: CP
Start: 2023-11-13 — End: 2024-11-07

## 2023-11-18 ENCOUNTER — Ambulatory Visit
Admit: 2023-11-18 | Discharge: 2023-11-19 | Payer: Medicare (Managed Care) | Attending: Student in an Organized Health Care Education/Training Program | Primary: Student in an Organized Health Care Education/Training Program

## 2023-11-18 DIAGNOSIS — R0609 Other forms of dyspnea: Principal | ICD-10-CM

## 2023-11-18 DIAGNOSIS — E1042 Type 1 diabetes mellitus with diabetic polyneuropathy: Principal | ICD-10-CM

## 2023-11-18 DIAGNOSIS — Z940 Kidney transplant status: Principal | ICD-10-CM

## 2023-11-18 DIAGNOSIS — D849 Immunodeficiency, unspecified: Principal | ICD-10-CM

## 2023-11-18 DIAGNOSIS — G5623 Lesion of ulnar nerve, bilateral upper limbs: Principal | ICD-10-CM

## 2023-11-18 DIAGNOSIS — G5613 Other lesions of median nerve, bilateral upper limbs: Principal | ICD-10-CM

## 2023-11-18 DIAGNOSIS — R200 Anesthesia of skin: Principal | ICD-10-CM

## 2023-11-18 DIAGNOSIS — N1831 Stage 3a chronic kidney disease (CMS-HCC): Principal | ICD-10-CM

## 2023-11-27 ENCOUNTER — Encounter
Admit: 2023-11-27 | Discharge: 2023-11-27 | Payer: Medicare (Managed Care) | Attending: Obstetrics & Gynecology | Primary: Obstetrics & Gynecology

## 2023-11-27 DIAGNOSIS — R87622 Low grade squamous intraepithelial lesion on cytologic smear of vagina (LGSIL): Principal | ICD-10-CM

## 2023-11-27 DIAGNOSIS — R8789 Other abnormal findings in specimens from female genital organs: Principal | ICD-10-CM

## 2023-11-27 DIAGNOSIS — N879 Dysplasia of cervix uteri, unspecified: Principal | ICD-10-CM

## 2023-12-02 MED FILL — MYCOPHENOLATE SODIUM 180 MG TABLET,DELAYED RELEASE: ORAL | 90 days supply | Qty: 360 | Fill #2

## 2023-12-16 DIAGNOSIS — R399 Unspecified symptoms and signs involving the genitourinary system: Principal | ICD-10-CM

## 2023-12-16 MED ORDER — NITROFURANTOIN MONOHYDRATE/MACROCRYSTALS 100 MG CAPSULE
ORAL_CAPSULE | Freq: Two times a day (BID) | ORAL | 0 refills | 5.00000 days | Status: CP
Start: 2023-12-16 — End: 2023-12-21

## 2023-12-18 ENCOUNTER — Ambulatory Visit: Admit: 2023-12-18 | Discharge: 2023-12-19 | Attending: Internal Medicine | Primary: Internal Medicine

## 2023-12-18 DIAGNOSIS — I471 SVT (supraventricular tachycardia) (HHS-HCC): Principal | ICD-10-CM

## 2023-12-19 DIAGNOSIS — Z940 Kidney transplant status: Principal | ICD-10-CM

## 2023-12-19 DIAGNOSIS — Z79899 Other long term (current) drug therapy: Principal | ICD-10-CM

## 2023-12-20 ENCOUNTER — Ambulatory Visit: Admit: 2023-12-20 | Discharge: 2023-12-21 | Payer: Medicare (Managed Care)

## 2023-12-20 DIAGNOSIS — Z79899 Other long term (current) drug therapy: Principal | ICD-10-CM

## 2023-12-20 DIAGNOSIS — Z940 Kidney transplant status: Principal | ICD-10-CM

## 2023-12-23 DIAGNOSIS — R399 Unspecified symptoms and signs involving the genitourinary system: Principal | ICD-10-CM

## 2023-12-23 MED ORDER — NITROFURANTOIN MONOHYDRATE/MACROCRYSTALS 100 MG CAPSULE
ORAL_CAPSULE | Freq: Two times a day (BID) | ORAL | 0 refills | 7.00000 days | Status: CP
Start: 2023-12-23 — End: 2023-12-30

## 2023-12-25 ENCOUNTER — Ambulatory Visit: Admit: 2023-12-25 | Discharge: 2023-12-26 | Payer: Medicare (Managed Care)

## 2023-12-25 MED ORDER — ASPIRIN 81 MG TABLET,DELAYED RELEASE
ORAL_TABLET | Freq: Every day | ORAL | 3 refills | 90.00000 days | Status: CP
Start: 2023-12-25 — End: 2024-12-24

## 2023-12-25 MED ORDER — ATORVASTATIN 80 MG TABLET
ORAL_TABLET | Freq: Every day | ORAL | 3 refills | 90.00000 days | Status: CP
Start: 2023-12-25 — End: 2024-12-24

## 2023-12-26 MED FILL — TACROLIMUS 1 MG CAPSULE, IMMEDIATE-RELEASE: ORAL | 30 days supply | Qty: 150 | Fill #1

## 2023-12-30 ENCOUNTER — Encounter
Admit: 2023-12-30 | Discharge: 2023-12-30 | Payer: Medicare (Managed Care) | Attending: Internal Medicine | Primary: Internal Medicine

## 2023-12-30 ENCOUNTER — Encounter
Admit: 2023-12-30 | Discharge: 2023-12-30 | Payer: Medicare (Managed Care) | Attending: Anesthesiology | Primary: Anesthesiology

## 2023-12-30 ENCOUNTER — Inpatient Hospital Stay: Admit: 2023-12-30 | Discharge: 2023-12-30 | Payer: Medicare (Managed Care)

## 2024-01-08 DIAGNOSIS — E66811 Class 1 obesity due to excess calories with serious comorbidity and body mass index (BMI) of 30.0 to 30.9 in adult: Principal | ICD-10-CM

## 2024-01-08 DIAGNOSIS — E1042 Type 1 diabetes mellitus with diabetic polyneuropathy: Principal | ICD-10-CM

## 2024-01-08 DIAGNOSIS — E6609 Other obesity due to excess calories: Principal | ICD-10-CM

## 2024-01-08 DIAGNOSIS — Z6830 Body mass index (BMI) 30.0-30.9, adult: Principal | ICD-10-CM

## 2024-01-08 MED ORDER — OZEMPIC 2 MG/DOSE (8 MG/3 ML) SUBCUTANEOUS PEN INJECTOR
SUBCUTANEOUS | 2 refills | 28.00000 days | Status: CP
Start: 2024-01-08 — End: ?

## 2024-01-15 ENCOUNTER — Ambulatory Visit
Admit: 2024-01-15 | Discharge: 2024-01-16 | Payer: Medicare (Managed Care) | Attending: Student in an Organized Health Care Education/Training Program | Primary: Student in an Organized Health Care Education/Training Program

## 2024-01-15 DIAGNOSIS — E1042 Type 1 diabetes mellitus with diabetic polyneuropathy: Principal | ICD-10-CM

## 2024-01-15 DIAGNOSIS — G5613 Other lesions of median nerve, bilateral upper limbs: Principal | ICD-10-CM

## 2024-01-15 DIAGNOSIS — R200 Anesthesia of skin: Principal | ICD-10-CM

## 2024-01-15 DIAGNOSIS — D849 Immunodeficiency, unspecified: Principal | ICD-10-CM

## 2024-01-15 DIAGNOSIS — G5623 Lesion of ulnar nerve, bilateral upper limbs: Principal | ICD-10-CM

## 2024-01-15 DIAGNOSIS — Z940 Kidney transplant status: Principal | ICD-10-CM

## 2024-01-15 DIAGNOSIS — N1831 Stage 3a chronic kidney disease (CMS-HCC): Principal | ICD-10-CM

## 2024-01-22 MED FILL — TACROLIMUS 1 MG CAPSULE, IMMEDIATE-RELEASE: ORAL | 30 days supply | Qty: 150 | Fill #2

## 2024-02-14 ENCOUNTER — Ambulatory Visit: Admit: 2024-02-14 | Discharge: 2024-02-15 | Payer: Medicare (Managed Care)

## 2024-02-14 DIAGNOSIS — I2510 Atherosclerotic heart disease of native coronary artery without angina pectoris: Principal | ICD-10-CM

## 2024-02-14 DIAGNOSIS — D849 Immunodeficiency, unspecified: Principal | ICD-10-CM

## 2024-02-14 DIAGNOSIS — Z9483 Pancreas transplant status: Principal | ICD-10-CM

## 2024-02-14 DIAGNOSIS — N1831 Anemia in stage 3a chronic kidney disease (CMS-HCC): Principal | ICD-10-CM

## 2024-02-14 DIAGNOSIS — D631 Anemia in chronic kidney disease: Principal | ICD-10-CM

## 2024-02-14 DIAGNOSIS — I959 Hypotension, unspecified: Principal | ICD-10-CM

## 2024-02-14 DIAGNOSIS — Z79899 Other long term (current) drug therapy: Principal | ICD-10-CM

## 2024-02-14 DIAGNOSIS — I471 SVT (supraventricular tachycardia) (HHS-HCC): Principal | ICD-10-CM

## 2024-02-14 DIAGNOSIS — Z940 Kidney transplant status: Principal | ICD-10-CM

## 2024-02-14 MED ORDER — SODIUM BICARBONATE 650 MG TABLET
ORAL_TABLET | Freq: Three times a day (TID) | ORAL | 11 refills | 30.00000 days | Status: CP
Start: 2024-02-14 — End: ?

## 2024-02-14 MED ORDER — METOPROLOL SUCCINATE ER 25 MG TABLET,EXTENDED RELEASE 24 HR
ORAL_TABLET | Freq: Every day | ORAL | 3 refills | 90.00000 days | Status: CP
Start: 2024-02-14 — End: ?

## 2024-02-17 MED FILL — TACROLIMUS 1 MG CAPSULE, IMMEDIATE-RELEASE: ORAL | 90 days supply | Qty: 450 | Fill #3

## 2024-02-17 MED FILL — MYCOPHENOLATE SODIUM 180 MG TABLET,DELAYED RELEASE: ORAL | 90 days supply | Qty: 360 | Fill #3

## 2024-03-06 DIAGNOSIS — R0609 Other forms of dyspnea: Principal | ICD-10-CM

## 2024-03-06 MED ORDER — ALBUTEROL SULFATE HFA 90 MCG/ACTUATION AEROSOL INHALER
RESPIRATORY_TRACT | 0 refills | 0.00000 days | Status: CP
Start: 2024-03-06 — End: ?

## 2024-03-25 DIAGNOSIS — E6609 Other obesity due to excess calories: Secondary | ICD-10-CM

## 2024-03-25 DIAGNOSIS — E1151 Type 2 diabetes mellitus with diabetic peripheral angiopathy without gangrene: Secondary | ICD-10-CM

## 2024-03-25 DIAGNOSIS — E66811 Class 1 obesity due to excess calories with serious comorbidity and body mass index (BMI) of 30.0 to 30.9 in adult: Principal | ICD-10-CM

## 2024-03-25 DIAGNOSIS — Z6830 Body mass index (BMI) 30.0-30.9, adult: Secondary | ICD-10-CM

## 2024-03-25 MED ORDER — OZEMPIC 2 MG/DOSE (8 MG/3 ML) SUBCUTANEOUS PEN INJECTOR
SUBCUTANEOUS | 2 refills | 28.00000 days | Status: CP
Start: 2024-03-25 — End: ?

## 2024-04-01 DIAGNOSIS — E1151 Type 2 diabetes mellitus with diabetic peripheral angiopathy without gangrene: Principal | ICD-10-CM
